# Patient Record
Sex: Female | Born: 1945
Health system: Southern US, Community
[De-identification: ages and names within clinical notes are randomized; demographics above are authoritative.]

## PROBLEM LIST (undated history)

## (undated) DIAGNOSIS — Z95810 Presence of automatic (implantable) cardiac defibrillator: Secondary | ICD-10-CM

## (undated) DIAGNOSIS — J9690 Respiratory failure, unspecified, unspecified whether with hypoxia or hypercapnia: Secondary | ICD-10-CM

## (undated) DIAGNOSIS — I480 Paroxysmal atrial fibrillation: Secondary | ICD-10-CM

## (undated) DIAGNOSIS — I5042 Chronic combined systolic (congestive) and diastolic (congestive) heart failure: Secondary | ICD-10-CM

## (undated) DIAGNOSIS — D649 Anemia, unspecified: Secondary | ICD-10-CM

## (undated) DIAGNOSIS — I2510 Atherosclerotic heart disease of native coronary artery without angina pectoris: Secondary | ICD-10-CM

## (undated) DIAGNOSIS — K279 Peptic ulcer, site unspecified, unspecified as acute or chronic, without hemorrhage or perforation: Secondary | ICD-10-CM

## (undated) DIAGNOSIS — K922 Gastrointestinal hemorrhage, unspecified: Secondary | ICD-10-CM

## (undated) DIAGNOSIS — J449 Chronic obstructive pulmonary disease, unspecified: Secondary | ICD-10-CM

## (undated) DIAGNOSIS — E119 Type 2 diabetes mellitus without complications: Secondary | ICD-10-CM

## (undated) DIAGNOSIS — I428 Other cardiomyopathies: Secondary | ICD-10-CM

## (undated) DIAGNOSIS — R053 Chronic cough: Secondary | ICD-10-CM

## (undated) DIAGNOSIS — F419 Anxiety disorder, unspecified: Secondary | ICD-10-CM

## (undated) DIAGNOSIS — E785 Hyperlipidemia, unspecified: Secondary | ICD-10-CM

## (undated) DIAGNOSIS — R0600 Dyspnea, unspecified: Secondary | ICD-10-CM

## (undated) DIAGNOSIS — I10 Essential (primary) hypertension: Secondary | ICD-10-CM

## (undated) DIAGNOSIS — I209 Angina pectoris, unspecified: Secondary | ICD-10-CM

## (undated) DIAGNOSIS — A0472 Enterocolitis due to Clostridium difficile, not specified as recurrent: Secondary | ICD-10-CM

## (undated) DIAGNOSIS — R05 Cough: Secondary | ICD-10-CM

## (undated) DIAGNOSIS — Z87442 Personal history of urinary calculi: Secondary | ICD-10-CM

## (undated) HISTORY — DX: Respiratory failure, unspecified, unspecified whether with hypoxia or hypercapnia: J96.90

## (undated) HISTORY — PX: ABDOMINAL HYSTERECTOMY: SHX81

## (undated) HISTORY — PX: CHOLECYSTECTOMY: SHX55

## (undated) HISTORY — PX: KIDNEY STONE SURGERY: SHX686

## (undated) HISTORY — DX: Anxiety disorder, unspecified: F41.9

## (undated) HISTORY — PX: COLONOSCOPY: SHX174

## (undated) HISTORY — PX: CORONARY ANGIOPLASTY WITH STENT PLACEMENT: SHX49

## (undated) HISTORY — PX: NASAL SEPTUM SURGERY: SHX37

## (undated) HISTORY — PX: APPENDECTOMY: SHX54

## (undated) HISTORY — PX: BOWEL RESECTION: SHX1257

## (undated) HISTORY — DX: Enterocolitis due to Clostridium difficile, not specified as recurrent: A04.72

## (undated) HISTORY — PX: CARPAL TUNNEL RELEASE: SHX101

---

## 2014-09-17 DIAGNOSIS — R079 Chest pain, unspecified: Secondary | ICD-10-CM | POA: Diagnosis not present

## 2014-09-17 DIAGNOSIS — R0989 Other specified symptoms and signs involving the circulatory and respiratory systems: Secondary | ICD-10-CM | POA: Diagnosis not present

## 2014-09-18 DIAGNOSIS — R1084 Generalized abdominal pain: Secondary | ICD-10-CM | POA: Diagnosis not present

## 2014-09-18 DIAGNOSIS — E119 Type 2 diabetes mellitus without complications: Secondary | ICD-10-CM | POA: Diagnosis not present

## 2014-09-18 DIAGNOSIS — G629 Polyneuropathy, unspecified: Secondary | ICD-10-CM | POA: Diagnosis not present

## 2014-10-03 DIAGNOSIS — Z760 Encounter for issue of repeat prescription: Secondary | ICD-10-CM | POA: Diagnosis not present

## 2014-10-03 DIAGNOSIS — K5900 Constipation, unspecified: Secondary | ICD-10-CM | POA: Diagnosis not present

## 2014-10-03 DIAGNOSIS — M792 Neuralgia and neuritis, unspecified: Secondary | ICD-10-CM | POA: Diagnosis not present

## 2014-11-07 DIAGNOSIS — M792 Neuralgia and neuritis, unspecified: Secondary | ICD-10-CM | POA: Diagnosis not present

## 2014-11-07 DIAGNOSIS — Z760 Encounter for issue of repeat prescription: Secondary | ICD-10-CM | POA: Diagnosis not present

## 2014-12-12 DIAGNOSIS — E119 Type 2 diabetes mellitus without complications: Secondary | ICD-10-CM | POA: Diagnosis not present

## 2014-12-16 DIAGNOSIS — E1142 Type 2 diabetes mellitus with diabetic polyneuropathy: Secondary | ICD-10-CM | POA: Diagnosis not present

## 2014-12-16 DIAGNOSIS — I10 Essential (primary) hypertension: Secondary | ICD-10-CM | POA: Diagnosis not present

## 2014-12-16 DIAGNOSIS — E1121 Type 2 diabetes mellitus with diabetic nephropathy: Secondary | ICD-10-CM | POA: Diagnosis not present

## 2014-12-16 DIAGNOSIS — J439 Emphysema, unspecified: Secondary | ICD-10-CM | POA: Diagnosis not present

## 2015-01-20 DIAGNOSIS — Z760 Encounter for issue of repeat prescription: Secondary | ICD-10-CM | POA: Diagnosis not present

## 2015-01-20 DIAGNOSIS — M792 Neuralgia and neuritis, unspecified: Secondary | ICD-10-CM | POA: Diagnosis not present

## 2015-01-28 DIAGNOSIS — I10 Essential (primary) hypertension: Secondary | ICD-10-CM | POA: Diagnosis not present

## 2015-01-28 DIAGNOSIS — E784 Other hyperlipidemia: Secondary | ICD-10-CM | POA: Diagnosis not present

## 2015-01-28 DIAGNOSIS — I2510 Atherosclerotic heart disease of native coronary artery without angina pectoris: Secondary | ICD-10-CM | POA: Diagnosis not present

## 2015-02-06 DIAGNOSIS — B070 Plantar wart: Secondary | ICD-10-CM | POA: Diagnosis not present

## 2015-02-06 DIAGNOSIS — M79671 Pain in right foot: Secondary | ICD-10-CM | POA: Diagnosis not present

## 2015-02-25 DIAGNOSIS — E119 Type 2 diabetes mellitus without complications: Secondary | ICD-10-CM | POA: Diagnosis not present

## 2015-02-25 DIAGNOSIS — R1110 Vomiting, unspecified: Secondary | ICD-10-CM | POA: Diagnosis not present

## 2015-02-25 DIAGNOSIS — R197 Diarrhea, unspecified: Secondary | ICD-10-CM | POA: Diagnosis not present

## 2015-02-26 DIAGNOSIS — R1110 Vomiting, unspecified: Secondary | ICD-10-CM | POA: Diagnosis not present

## 2015-02-26 DIAGNOSIS — R197 Diarrhea, unspecified: Secondary | ICD-10-CM | POA: Diagnosis not present

## 2015-02-26 DIAGNOSIS — E119 Type 2 diabetes mellitus without complications: Secondary | ICD-10-CM | POA: Diagnosis not present

## 2015-02-28 DIAGNOSIS — N179 Acute kidney failure, unspecified: Secondary | ICD-10-CM | POA: Diagnosis not present

## 2015-02-28 DIAGNOSIS — I2510 Atherosclerotic heart disease of native coronary artery without angina pectoris: Secondary | ICD-10-CM | POA: Diagnosis not present

## 2015-02-28 DIAGNOSIS — J9600 Acute respiratory failure, unspecified whether with hypoxia or hypercapnia: Secondary | ICD-10-CM | POA: Diagnosis not present

## 2015-02-28 DIAGNOSIS — R40243 Glasgow coma scale score 3-8: Secondary | ICD-10-CM | POA: Diagnosis not present

## 2015-02-28 DIAGNOSIS — R918 Other nonspecific abnormal finding of lung field: Secondary | ICD-10-CM | POA: Diagnosis not present

## 2015-02-28 DIAGNOSIS — J90 Pleural effusion, not elsewhere classified: Secondary | ICD-10-CM | POA: Diagnosis not present

## 2015-02-28 DIAGNOSIS — K529 Noninfective gastroenteritis and colitis, unspecified: Secondary | ICD-10-CM | POA: Diagnosis not present

## 2015-02-28 DIAGNOSIS — E119 Type 2 diabetes mellitus without complications: Secondary | ICD-10-CM | POA: Diagnosis present

## 2015-02-28 DIAGNOSIS — R0989 Other specified symptoms and signs involving the circulatory and respiratory systems: Secondary | ICD-10-CM | POA: Diagnosis not present

## 2015-02-28 DIAGNOSIS — C3490 Malignant neoplasm of unspecified part of unspecified bronchus or lung: Secondary | ICD-10-CM | POA: Diagnosis not present

## 2015-02-28 DIAGNOSIS — G8929 Other chronic pain: Secondary | ICD-10-CM | POA: Diagnosis present

## 2015-02-28 DIAGNOSIS — R197 Diarrhea, unspecified: Secondary | ICD-10-CM | POA: Diagnosis not present

## 2015-02-28 DIAGNOSIS — Z955 Presence of coronary angioplasty implant and graft: Secondary | ICD-10-CM | POA: Diagnosis not present

## 2015-02-28 DIAGNOSIS — E860 Dehydration: Secondary | ICD-10-CM | POA: Diagnosis present

## 2015-02-28 DIAGNOSIS — A411 Sepsis due to other specified staphylococcus: Secondary | ICD-10-CM | POA: Diagnosis not present

## 2015-02-28 DIAGNOSIS — A419 Sepsis, unspecified organism: Secondary | ICD-10-CM | POA: Diagnosis not present

## 2015-02-28 DIAGNOSIS — K6389 Other specified diseases of intestine: Secondary | ICD-10-CM | POA: Diagnosis not present

## 2015-02-28 DIAGNOSIS — M6281 Muscle weakness (generalized): Secondary | ICD-10-CM | POA: Diagnosis present

## 2015-02-28 DIAGNOSIS — R6520 Severe sepsis without septic shock: Secondary | ICD-10-CM | POA: Diagnosis not present

## 2015-02-28 DIAGNOSIS — I4891 Unspecified atrial fibrillation: Secondary | ICD-10-CM | POA: Diagnosis not present

## 2015-02-28 DIAGNOSIS — J9601 Acute respiratory failure with hypoxia: Secondary | ICD-10-CM | POA: Diagnosis not present

## 2015-02-28 DIAGNOSIS — Z7902 Long term (current) use of antithrombotics/antiplatelets: Secondary | ICD-10-CM | POA: Diagnosis not present

## 2015-02-28 DIAGNOSIS — Z7952 Long term (current) use of systemic steroids: Secondary | ICD-10-CM | POA: Diagnosis not present

## 2015-02-28 DIAGNOSIS — J189 Pneumonia, unspecified organism: Secondary | ICD-10-CM | POA: Diagnosis not present

## 2015-02-28 DIAGNOSIS — M79659 Pain in unspecified thigh: Secondary | ICD-10-CM | POA: Diagnosis not present

## 2015-02-28 DIAGNOSIS — R6521 Severe sepsis with septic shock: Secondary | ICD-10-CM | POA: Diagnosis not present

## 2015-02-28 DIAGNOSIS — J984 Other disorders of lung: Secondary | ICD-10-CM | POA: Diagnosis not present

## 2015-02-28 DIAGNOSIS — J181 Lobar pneumonia, unspecified organism: Secondary | ICD-10-CM | POA: Diagnosis not present

## 2015-02-28 DIAGNOSIS — Z66 Do not resuscitate: Secondary | ICD-10-CM | POA: Diagnosis present

## 2015-02-28 DIAGNOSIS — R262 Difficulty in walking, not elsewhere classified: Secondary | ICD-10-CM | POA: Diagnosis present

## 2015-02-28 DIAGNOSIS — J449 Chronic obstructive pulmonary disease, unspecified: Secondary | ICD-10-CM | POA: Diagnosis present

## 2015-02-28 DIAGNOSIS — J441 Chronic obstructive pulmonary disease with (acute) exacerbation: Secondary | ICD-10-CM | POA: Diagnosis not present

## 2015-02-28 DIAGNOSIS — F419 Anxiety disorder, unspecified: Secondary | ICD-10-CM | POA: Diagnosis present

## 2015-02-28 DIAGNOSIS — Z7982 Long term (current) use of aspirin: Secondary | ICD-10-CM | POA: Diagnosis not present

## 2015-02-28 DIAGNOSIS — Z452 Encounter for adjustment and management of vascular access device: Secondary | ICD-10-CM | POA: Diagnosis not present

## 2015-02-28 DIAGNOSIS — M353 Polymyalgia rheumatica: Secondary | ICD-10-CM | POA: Diagnosis present

## 2015-02-28 DIAGNOSIS — A047 Enterocolitis due to Clostridium difficile: Secondary | ICD-10-CM | POA: Diagnosis present

## 2015-02-28 DIAGNOSIS — A048 Other specified bacterial intestinal infections: Secondary | ICD-10-CM | POA: Diagnosis present

## 2015-02-28 DIAGNOSIS — T40601A Poisoning by unspecified narcotics, accidental (unintentional), initial encounter: Secondary | ICD-10-CM | POA: Diagnosis not present

## 2015-02-28 DIAGNOSIS — E118 Type 2 diabetes mellitus with unspecified complications: Secondary | ICD-10-CM | POA: Diagnosis present

## 2015-02-28 DIAGNOSIS — N19 Unspecified kidney failure: Secondary | ICD-10-CM | POA: Diagnosis not present

## 2015-02-28 DIAGNOSIS — R4182 Altered mental status, unspecified: Secondary | ICD-10-CM | POA: Diagnosis not present

## 2015-02-28 DIAGNOSIS — D649 Anemia, unspecified: Secondary | ICD-10-CM | POA: Diagnosis present

## 2015-02-28 DIAGNOSIS — J159 Unspecified bacterial pneumonia: Secondary | ICD-10-CM | POA: Diagnosis not present

## 2015-02-28 DIAGNOSIS — J440 Chronic obstructive pulmonary disease with acute lower respiratory infection: Secondary | ICD-10-CM | POA: Diagnosis not present

## 2015-02-28 DIAGNOSIS — E876 Hypokalemia: Secondary | ICD-10-CM | POA: Diagnosis not present

## 2015-02-28 DIAGNOSIS — I480 Paroxysmal atrial fibrillation: Secondary | ICD-10-CM | POA: Diagnosis not present

## 2015-02-28 DIAGNOSIS — F17210 Nicotine dependence, cigarettes, uncomplicated: Secondary | ICD-10-CM | POA: Diagnosis not present

## 2015-03-13 DIAGNOSIS — A419 Sepsis, unspecified organism: Secondary | ICD-10-CM | POA: Diagnosis not present

## 2015-03-13 DIAGNOSIS — R001 Bradycardia, unspecified: Secondary | ICD-10-CM | POA: Diagnosis not present

## 2015-03-13 DIAGNOSIS — D72829 Elevated white blood cell count, unspecified: Secondary | ICD-10-CM | POA: Diagnosis not present

## 2015-03-13 DIAGNOSIS — D500 Iron deficiency anemia secondary to blood loss (chronic): Secondary | ICD-10-CM | POA: Diagnosis not present

## 2015-03-13 DIAGNOSIS — N179 Acute kidney failure, unspecified: Secondary | ICD-10-CM | POA: Diagnosis not present

## 2015-03-13 DIAGNOSIS — J9601 Acute respiratory failure with hypoxia: Secondary | ICD-10-CM | POA: Diagnosis not present

## 2015-03-13 DIAGNOSIS — A047 Enterocolitis due to Clostridium difficile: Secondary | ICD-10-CM | POA: Diagnosis present

## 2015-03-13 DIAGNOSIS — R600 Localized edema: Secondary | ICD-10-CM | POA: Diagnosis not present

## 2015-03-13 DIAGNOSIS — E119 Type 2 diabetes mellitus without complications: Secondary | ICD-10-CM | POA: Diagnosis not present

## 2015-03-13 DIAGNOSIS — E876 Hypokalemia: Secondary | ICD-10-CM | POA: Diagnosis not present

## 2015-03-13 DIAGNOSIS — M6281 Muscle weakness (generalized): Secondary | ICD-10-CM | POA: Diagnosis present

## 2015-03-13 DIAGNOSIS — T40601A Poisoning by unspecified narcotics, accidental (unintentional), initial encounter: Secondary | ICD-10-CM | POA: Diagnosis not present

## 2015-03-13 DIAGNOSIS — J441 Chronic obstructive pulmonary disease with (acute) exacerbation: Secondary | ICD-10-CM | POA: Diagnosis not present

## 2015-03-13 DIAGNOSIS — J189 Pneumonia, unspecified organism: Secondary | ICD-10-CM | POA: Diagnosis not present

## 2015-03-13 DIAGNOSIS — R197 Diarrhea, unspecified: Secondary | ICD-10-CM | POA: Diagnosis not present

## 2015-03-13 DIAGNOSIS — R262 Difficulty in walking, not elsewhere classified: Secondary | ICD-10-CM | POA: Diagnosis present

## 2015-03-13 DIAGNOSIS — J449 Chronic obstructive pulmonary disease, unspecified: Secondary | ICD-10-CM | POA: Diagnosis present

## 2015-03-13 DIAGNOSIS — J202 Acute bronchitis due to streptococcus: Secondary | ICD-10-CM | POA: Diagnosis not present

## 2015-03-13 DIAGNOSIS — E118 Type 2 diabetes mellitus with unspecified complications: Secondary | ICD-10-CM | POA: Diagnosis present

## 2015-03-13 DIAGNOSIS — I480 Paroxysmal atrial fibrillation: Secondary | ICD-10-CM | POA: Diagnosis not present

## 2015-03-13 DIAGNOSIS — G8929 Other chronic pain: Secondary | ICD-10-CM | POA: Diagnosis present

## 2015-03-13 DIAGNOSIS — A048 Other specified bacterial intestinal infections: Secondary | ICD-10-CM | POA: Diagnosis present

## 2015-03-13 DIAGNOSIS — I4891 Unspecified atrial fibrillation: Secondary | ICD-10-CM | POA: Diagnosis not present

## 2015-03-18 DIAGNOSIS — D500 Iron deficiency anemia secondary to blood loss (chronic): Secondary | ICD-10-CM | POA: Diagnosis not present

## 2015-03-18 DIAGNOSIS — R600 Localized edema: Secondary | ICD-10-CM | POA: Diagnosis not present

## 2015-03-18 DIAGNOSIS — A047 Enterocolitis due to Clostridium difficile: Secondary | ICD-10-CM | POA: Diagnosis not present

## 2015-03-19 DIAGNOSIS — E876 Hypokalemia: Secondary | ICD-10-CM | POA: Diagnosis not present

## 2015-03-19 DIAGNOSIS — D72829 Elevated white blood cell count, unspecified: Secondary | ICD-10-CM | POA: Diagnosis not present

## 2015-03-19 DIAGNOSIS — R197 Diarrhea, unspecified: Secondary | ICD-10-CM | POA: Diagnosis not present

## 2015-03-24 DIAGNOSIS — D500 Iron deficiency anemia secondary to blood loss (chronic): Secondary | ICD-10-CM | POA: Diagnosis not present

## 2015-03-24 DIAGNOSIS — J202 Acute bronchitis due to streptococcus: Secondary | ICD-10-CM | POA: Diagnosis not present

## 2015-03-24 DIAGNOSIS — J441 Chronic obstructive pulmonary disease with (acute) exacerbation: Secondary | ICD-10-CM | POA: Diagnosis not present

## 2015-03-28 DIAGNOSIS — G629 Polyneuropathy, unspecified: Secondary | ICD-10-CM | POA: Diagnosis not present

## 2015-03-28 DIAGNOSIS — I10 Essential (primary) hypertension: Secondary | ICD-10-CM | POA: Diagnosis not present

## 2015-03-28 DIAGNOSIS — J439 Emphysema, unspecified: Secondary | ICD-10-CM | POA: Diagnosis not present

## 2015-04-01 DIAGNOSIS — L03115 Cellulitis of right lower limb: Secondary | ICD-10-CM | POA: Diagnosis not present

## 2015-04-03 DIAGNOSIS — M353 Polymyalgia rheumatica: Secondary | ICD-10-CM | POA: Diagnosis present

## 2015-04-03 DIAGNOSIS — N179 Acute kidney failure, unspecified: Secondary | ICD-10-CM | POA: Diagnosis not present

## 2015-04-03 DIAGNOSIS — Z955 Presence of coronary angioplasty implant and graft: Secondary | ICD-10-CM | POA: Diagnosis not present

## 2015-04-03 DIAGNOSIS — R197 Diarrhea, unspecified: Secondary | ICD-10-CM | POA: Diagnosis not present

## 2015-04-03 DIAGNOSIS — I2510 Atherosclerotic heart disease of native coronary artery without angina pectoris: Secondary | ICD-10-CM | POA: Diagnosis present

## 2015-04-03 DIAGNOSIS — R42 Dizziness and giddiness: Secondary | ICD-10-CM | POA: Diagnosis not present

## 2015-04-03 DIAGNOSIS — L03119 Cellulitis of unspecified part of limb: Secondary | ICD-10-CM | POA: Diagnosis not present

## 2015-04-03 DIAGNOSIS — F17210 Nicotine dependence, cigarettes, uncomplicated: Secondary | ICD-10-CM | POA: Diagnosis present

## 2015-04-03 DIAGNOSIS — E119 Type 2 diabetes mellitus without complications: Secondary | ICD-10-CM | POA: Diagnosis present

## 2015-04-03 DIAGNOSIS — E860 Dehydration: Secondary | ICD-10-CM | POA: Diagnosis not present

## 2015-04-03 DIAGNOSIS — J449 Chronic obstructive pulmonary disease, unspecified: Secondary | ICD-10-CM | POA: Diagnosis present

## 2015-04-03 DIAGNOSIS — L03115 Cellulitis of right lower limb: Secondary | ICD-10-CM | POA: Diagnosis not present

## 2015-04-03 DIAGNOSIS — R2241 Localized swelling, mass and lump, right lower limb: Secondary | ICD-10-CM | POA: Diagnosis not present

## 2015-04-11 DIAGNOSIS — L03115 Cellulitis of right lower limb: Secondary | ICD-10-CM | POA: Diagnosis not present

## 2015-04-15 DIAGNOSIS — I10 Essential (primary) hypertension: Secondary | ICD-10-CM | POA: Diagnosis not present

## 2015-04-15 DIAGNOSIS — L03115 Cellulitis of right lower limb: Secondary | ICD-10-CM | POA: Diagnosis not present

## 2015-04-22 DIAGNOSIS — R197 Diarrhea, unspecified: Secondary | ICD-10-CM | POA: Diagnosis not present

## 2015-04-22 DIAGNOSIS — J449 Chronic obstructive pulmonary disease, unspecified: Secondary | ICD-10-CM | POA: Diagnosis not present

## 2015-04-22 DIAGNOSIS — D72829 Elevated white blood cell count, unspecified: Secondary | ICD-10-CM | POA: Diagnosis not present

## 2015-04-22 DIAGNOSIS — Z9049 Acquired absence of other specified parts of digestive tract: Secondary | ICD-10-CM | POA: Diagnosis not present

## 2015-04-22 DIAGNOSIS — E876 Hypokalemia: Secondary | ICD-10-CM | POA: Diagnosis not present

## 2015-04-22 DIAGNOSIS — K529 Noninfective gastroenteritis and colitis, unspecified: Secondary | ICD-10-CM | POA: Diagnosis not present

## 2015-04-22 DIAGNOSIS — R109 Unspecified abdominal pain: Secondary | ICD-10-CM | POA: Diagnosis not present

## 2015-04-22 DIAGNOSIS — E119 Type 2 diabetes mellitus without complications: Secondary | ICD-10-CM | POA: Diagnosis not present

## 2015-04-22 DIAGNOSIS — K838 Other specified diseases of biliary tract: Secondary | ICD-10-CM | POA: Diagnosis not present

## 2015-04-22 DIAGNOSIS — A047 Enterocolitis due to Clostridium difficile: Secondary | ICD-10-CM | POA: Diagnosis not present

## 2015-04-22 DIAGNOSIS — K6389 Other specified diseases of intestine: Secondary | ICD-10-CM | POA: Diagnosis not present

## 2015-04-22 DIAGNOSIS — E8809 Other disorders of plasma-protein metabolism, not elsewhere classified: Secondary | ICD-10-CM | POA: Diagnosis not present

## 2015-04-23 DIAGNOSIS — D72829 Elevated white blood cell count, unspecified: Secondary | ICD-10-CM | POA: Diagnosis not present

## 2015-04-23 DIAGNOSIS — R109 Unspecified abdominal pain: Secondary | ICD-10-CM | POA: Diagnosis not present

## 2015-04-23 DIAGNOSIS — K529 Noninfective gastroenteritis and colitis, unspecified: Secondary | ICD-10-CM | POA: Diagnosis not present

## 2015-04-23 DIAGNOSIS — B967 Clostridium perfringens [C. perfringens] as the cause of diseases classified elsewhere: Secondary | ICD-10-CM | POA: Diagnosis not present

## 2015-04-23 DIAGNOSIS — R197 Diarrhea, unspecified: Secondary | ICD-10-CM | POA: Diagnosis not present

## 2015-04-23 DIAGNOSIS — A047 Enterocolitis due to Clostridium difficile: Secondary | ICD-10-CM | POA: Diagnosis not present

## 2015-04-23 DIAGNOSIS — E119 Type 2 diabetes mellitus without complications: Secondary | ICD-10-CM | POA: Diagnosis not present

## 2015-04-23 DIAGNOSIS — Z87891 Personal history of nicotine dependence: Secondary | ICD-10-CM | POA: Diagnosis not present

## 2015-04-23 DIAGNOSIS — I10 Essential (primary) hypertension: Secondary | ICD-10-CM | POA: Diagnosis not present

## 2015-04-23 DIAGNOSIS — R1031 Right lower quadrant pain: Secondary | ICD-10-CM | POA: Diagnosis not present

## 2015-04-24 DIAGNOSIS — Z7902 Long term (current) use of antithrombotics/antiplatelets: Secondary | ICD-10-CM | POA: Diagnosis not present

## 2015-04-24 DIAGNOSIS — Z87891 Personal history of nicotine dependence: Secondary | ICD-10-CM | POA: Diagnosis not present

## 2015-04-24 DIAGNOSIS — E876 Hypokalemia: Secondary | ICD-10-CM | POA: Diagnosis present

## 2015-04-24 DIAGNOSIS — A047 Enterocolitis due to Clostridium difficile: Secondary | ICD-10-CM | POA: Diagnosis not present

## 2015-04-24 DIAGNOSIS — R109 Unspecified abdominal pain: Secondary | ICD-10-CM | POA: Diagnosis not present

## 2015-04-24 DIAGNOSIS — I2510 Atherosclerotic heart disease of native coronary artery without angina pectoris: Secondary | ICD-10-CM | POA: Diagnosis present

## 2015-04-24 DIAGNOSIS — Z9049 Acquired absence of other specified parts of digestive tract: Secondary | ICD-10-CM | POA: Diagnosis present

## 2015-04-24 DIAGNOSIS — J449 Chronic obstructive pulmonary disease, unspecified: Secondary | ICD-10-CM | POA: Diagnosis present

## 2015-04-24 DIAGNOSIS — Z7982 Long term (current) use of aspirin: Secondary | ICD-10-CM | POA: Diagnosis not present

## 2015-04-24 DIAGNOSIS — K529 Noninfective gastroenteritis and colitis, unspecified: Secondary | ICD-10-CM | POA: Diagnosis not present

## 2015-04-24 DIAGNOSIS — R2241 Localized swelling, mass and lump, right lower limb: Secondary | ICD-10-CM | POA: Diagnosis not present

## 2015-04-24 DIAGNOSIS — D72829 Elevated white blood cell count, unspecified: Secondary | ICD-10-CM | POA: Diagnosis not present

## 2015-04-24 DIAGNOSIS — E119 Type 2 diabetes mellitus without complications: Secondary | ICD-10-CM | POA: Diagnosis present

## 2015-04-24 DIAGNOSIS — Z881 Allergy status to other antibiotic agents status: Secondary | ICD-10-CM | POA: Diagnosis not present

## 2015-04-24 DIAGNOSIS — E860 Dehydration: Secondary | ICD-10-CM | POA: Diagnosis present

## 2015-04-24 DIAGNOSIS — I10 Essential (primary) hypertension: Secondary | ICD-10-CM | POA: Diagnosis present

## 2015-04-24 DIAGNOSIS — E8809 Other disorders of plasma-protein metabolism, not elsewhere classified: Secondary | ICD-10-CM | POA: Diagnosis present

## 2015-04-24 DIAGNOSIS — R197 Diarrhea, unspecified: Secondary | ICD-10-CM | POA: Diagnosis not present

## 2015-04-24 DIAGNOSIS — G8929 Other chronic pain: Secondary | ICD-10-CM | POA: Diagnosis present

## 2015-04-28 DIAGNOSIS — A047 Enterocolitis due to Clostridium difficile: Secondary | ICD-10-CM | POA: Diagnosis not present

## 2015-05-03 DIAGNOSIS — J9811 Atelectasis: Secondary | ICD-10-CM | POA: Diagnosis not present

## 2015-05-03 DIAGNOSIS — E119 Type 2 diabetes mellitus without complications: Secondary | ICD-10-CM | POA: Diagnosis not present

## 2015-05-03 DIAGNOSIS — R0781 Pleurodynia: Secondary | ICD-10-CM | POA: Diagnosis not present

## 2015-05-03 DIAGNOSIS — I10 Essential (primary) hypertension: Secondary | ICD-10-CM | POA: Diagnosis not present

## 2015-05-03 DIAGNOSIS — R0602 Shortness of breath: Secondary | ICD-10-CM | POA: Diagnosis not present

## 2015-05-03 DIAGNOSIS — J441 Chronic obstructive pulmonary disease with (acute) exacerbation: Secondary | ICD-10-CM | POA: Diagnosis not present

## 2015-05-03 DIAGNOSIS — R9431 Abnormal electrocardiogram [ECG] [EKG]: Secondary | ICD-10-CM | POA: Diagnosis not present

## 2015-05-03 DIAGNOSIS — R072 Precordial pain: Secondary | ICD-10-CM | POA: Diagnosis not present

## 2015-05-03 DIAGNOSIS — R079 Chest pain, unspecified: Secondary | ICD-10-CM | POA: Diagnosis not present

## 2015-05-03 DIAGNOSIS — F17200 Nicotine dependence, unspecified, uncomplicated: Secondary | ICD-10-CM | POA: Diagnosis not present

## 2015-05-08 DIAGNOSIS — E559 Vitamin D deficiency, unspecified: Secondary | ICD-10-CM | POA: Diagnosis not present

## 2015-05-08 DIAGNOSIS — E1142 Type 2 diabetes mellitus with diabetic polyneuropathy: Secondary | ICD-10-CM | POA: Diagnosis not present

## 2015-05-12 DIAGNOSIS — N289 Disorder of kidney and ureter, unspecified: Secondary | ICD-10-CM | POA: Diagnosis not present

## 2015-05-12 DIAGNOSIS — I10 Essential (primary) hypertension: Secondary | ICD-10-CM | POA: Diagnosis not present

## 2015-05-12 DIAGNOSIS — K529 Noninfective gastroenteritis and colitis, unspecified: Secondary | ICD-10-CM | POA: Diagnosis not present

## 2015-05-12 DIAGNOSIS — I2510 Atherosclerotic heart disease of native coronary artery without angina pectoris: Secondary | ICD-10-CM | POA: Diagnosis not present

## 2015-05-12 DIAGNOSIS — E119 Type 2 diabetes mellitus without complications: Secondary | ICD-10-CM | POA: Diagnosis not present

## 2015-05-12 DIAGNOSIS — M353 Polymyalgia rheumatica: Secondary | ICD-10-CM | POA: Diagnosis present

## 2015-05-12 DIAGNOSIS — D72829 Elevated white blood cell count, unspecified: Secondary | ICD-10-CM | POA: Diagnosis not present

## 2015-05-12 DIAGNOSIS — Z7982 Long term (current) use of aspirin: Secondary | ICD-10-CM | POA: Diagnosis not present

## 2015-05-12 DIAGNOSIS — J449 Chronic obstructive pulmonary disease, unspecified: Secondary | ICD-10-CM | POA: Diagnosis not present

## 2015-05-12 DIAGNOSIS — G2581 Restless legs syndrome: Secondary | ICD-10-CM | POA: Diagnosis present

## 2015-05-12 DIAGNOSIS — A047 Enterocolitis due to Clostridium difficile: Secondary | ICD-10-CM | POA: Diagnosis not present

## 2015-05-12 DIAGNOSIS — D638 Anemia in other chronic diseases classified elsewhere: Secondary | ICD-10-CM | POA: Diagnosis not present

## 2015-05-12 DIAGNOSIS — E860 Dehydration: Secondary | ICD-10-CM | POA: Diagnosis not present

## 2015-05-12 DIAGNOSIS — F17210 Nicotine dependence, cigarettes, uncomplicated: Secondary | ICD-10-CM | POA: Diagnosis present

## 2015-05-12 DIAGNOSIS — R197 Diarrhea, unspecified: Secondary | ICD-10-CM | POA: Diagnosis not present

## 2015-05-12 DIAGNOSIS — Z23 Encounter for immunization: Secondary | ICD-10-CM | POA: Diagnosis not present

## 2015-05-12 DIAGNOSIS — D126 Benign neoplasm of colon, unspecified: Secondary | ICD-10-CM | POA: Diagnosis not present

## 2015-05-12 DIAGNOSIS — K6389 Other specified diseases of intestine: Secondary | ICD-10-CM | POA: Diagnosis not present

## 2015-05-25 ENCOUNTER — Encounter (HOSPITAL_COMMUNITY): Payer: Self-pay | Admitting: Emergency Medicine

## 2015-05-25 ENCOUNTER — Inpatient Hospital Stay (HOSPITAL_COMMUNITY)
Admission: EM | Admit: 2015-05-25 | Discharge: 2015-06-02 | DRG: 371 | Disposition: A | Discharge status: Home / Self Care | Payer: Medicare Other | Attending: Internal Medicine | Admitting: Internal Medicine

## 2015-05-25 DIAGNOSIS — Z885 Allergy status to narcotic agent status: Secondary | ICD-10-CM

## 2015-05-25 DIAGNOSIS — R609 Edema, unspecified: Secondary | ICD-10-CM | POA: Diagnosis not present

## 2015-05-25 DIAGNOSIS — D638 Anemia in other chronic diseases classified elsewhere: Secondary | ICD-10-CM | POA: Diagnosis present

## 2015-05-25 DIAGNOSIS — Z7952 Long term (current) use of systemic steroids: Secondary | ICD-10-CM

## 2015-05-25 DIAGNOSIS — Z720 Tobacco use: Secondary | ICD-10-CM | POA: Diagnosis present

## 2015-05-25 DIAGNOSIS — Z86010 Personal history of colonic polyps: Secondary | ICD-10-CM

## 2015-05-25 DIAGNOSIS — A047 Enterocolitis due to Clostridium difficile: Principal | ICD-10-CM | POA: Diagnosis present

## 2015-05-25 DIAGNOSIS — A0471 Enterocolitis due to Clostridium difficile, recurrent: Secondary | ICD-10-CM | POA: Diagnosis present

## 2015-05-25 DIAGNOSIS — I2510 Atherosclerotic heart disease of native coronary artery without angina pectoris: Secondary | ICD-10-CM | POA: Diagnosis present

## 2015-05-25 DIAGNOSIS — D473 Essential (hemorrhagic) thrombocythemia: Secondary | ICD-10-CM | POA: Diagnosis present

## 2015-05-25 DIAGNOSIS — K550 Acute vascular disorders of intestine: Secondary | ICD-10-CM | POA: Diagnosis not present

## 2015-05-25 DIAGNOSIS — K219 Gastro-esophageal reflux disease without esophagitis: Secondary | ICD-10-CM | POA: Diagnosis present

## 2015-05-25 DIAGNOSIS — Z955 Presence of coronary angioplasty implant and graft: Secondary | ICD-10-CM | POA: Diagnosis not present

## 2015-05-25 DIAGNOSIS — I774 Celiac artery compression syndrome: Secondary | ICD-10-CM | POA: Diagnosis not present

## 2015-05-25 DIAGNOSIS — E876 Hypokalemia: Secondary | ICD-10-CM | POA: Diagnosis present

## 2015-05-25 DIAGNOSIS — Z79891 Long term (current) use of opiate analgesic: Secondary | ICD-10-CM

## 2015-05-25 DIAGNOSIS — M353 Polymyalgia rheumatica: Secondary | ICD-10-CM | POA: Diagnosis present

## 2015-05-25 DIAGNOSIS — K5730 Diverticulosis of large intestine without perforation or abscess without bleeding: Secondary | ICD-10-CM | POA: Diagnosis not present

## 2015-05-25 DIAGNOSIS — D62 Acute posthemorrhagic anemia: Secondary | ICD-10-CM | POA: Diagnosis present

## 2015-05-25 DIAGNOSIS — E1129 Type 2 diabetes mellitus with other diabetic kidney complication: Secondary | ICD-10-CM | POA: Diagnosis not present

## 2015-05-25 DIAGNOSIS — R634 Abnormal weight loss: Secondary | ICD-10-CM | POA: Diagnosis not present

## 2015-05-25 DIAGNOSIS — K551 Chronic vascular disorders of intestine: Secondary | ICD-10-CM | POA: Diagnosis not present

## 2015-05-25 DIAGNOSIS — I10 Essential (primary) hypertension: Secondary | ICD-10-CM | POA: Diagnosis not present

## 2015-05-25 DIAGNOSIS — F17210 Nicotine dependence, cigarettes, uncomplicated: Secondary | ICD-10-CM | POA: Diagnosis present

## 2015-05-25 DIAGNOSIS — J449 Chronic obstructive pulmonary disease, unspecified: Secondary | ICD-10-CM | POA: Diagnosis present

## 2015-05-25 DIAGNOSIS — R194 Change in bowel habit: Secondary | ICD-10-CM | POA: Diagnosis not present

## 2015-05-25 DIAGNOSIS — R197 Diarrhea, unspecified: Secondary | ICD-10-CM | POA: Diagnosis not present

## 2015-05-25 DIAGNOSIS — K625 Hemorrhage of anus and rectum: Secondary | ICD-10-CM | POA: Diagnosis not present

## 2015-05-25 DIAGNOSIS — K559 Vascular disorder of intestine, unspecified: Secondary | ICD-10-CM

## 2015-05-25 DIAGNOSIS — K449 Diaphragmatic hernia without obstruction or gangrene: Secondary | ICD-10-CM | POA: Diagnosis present

## 2015-05-25 DIAGNOSIS — E860 Dehydration: Secondary | ICD-10-CM | POA: Diagnosis not present

## 2015-05-25 DIAGNOSIS — N179 Acute kidney failure, unspecified: Secondary | ICD-10-CM | POA: Diagnosis present

## 2015-05-25 DIAGNOSIS — Z886 Allergy status to analgesic agent status: Secondary | ICD-10-CM | POA: Diagnosis not present

## 2015-05-25 DIAGNOSIS — Z79899 Other long term (current) drug therapy: Secondary | ICD-10-CM | POA: Diagnosis not present

## 2015-05-25 DIAGNOSIS — I25119 Atherosclerotic heart disease of native coronary artery with unspecified angina pectoris: Secondary | ICD-10-CM | POA: Diagnosis present

## 2015-05-25 DIAGNOSIS — A0472 Enterocolitis due to Clostridium difficile, not specified as recurrent: Secondary | ICD-10-CM | POA: Diagnosis present

## 2015-05-25 DIAGNOSIS — E43 Unspecified severe protein-calorie malnutrition: Secondary | ICD-10-CM | POA: Diagnosis present

## 2015-05-25 DIAGNOSIS — Z8249 Family history of ischemic heart disease and other diseases of the circulatory system: Secondary | ICD-10-CM | POA: Diagnosis not present

## 2015-05-25 DIAGNOSIS — R109 Unspecified abdominal pain: Secondary | ICD-10-CM | POA: Diagnosis not present

## 2015-05-25 HISTORY — DX: Atherosclerotic heart disease of native coronary artery without angina pectoris: I25.10

## 2015-05-25 HISTORY — DX: Essential (primary) hypertension: I10

## 2015-05-25 LAB — URINE MICROSCOPIC-ADD ON

## 2015-05-25 LAB — COMPREHENSIVE METABOLIC PANEL
ALBUMIN: 3.3 g/dL — AB (ref 3.5–5.0)
ALT: 12 U/L — ABNORMAL LOW (ref 14–54)
ANION GAP: 13 (ref 5–15)
AST: 31 U/L (ref 15–41)
Alkaline Phosphatase: 120 U/L (ref 38–126)
BILIRUBIN TOTAL: 0.5 mg/dL (ref 0.3–1.2)
BUN: 16 mg/dL (ref 6–20)
CO2: 23 mmol/L (ref 22–32)
Calcium: 9.7 mg/dL (ref 8.9–10.3)
Chloride: 100 mmol/L — ABNORMAL LOW (ref 101–111)
Creatinine, Ser: 1.42 mg/dL — ABNORMAL HIGH (ref 0.44–1.00)
GFR, EST AFRICAN AMERICAN: 42 mL/min — AB (ref >60)
GFR, EST NON AFRICAN AMERICAN: 36 mL/min — AB (ref >60)
Glucose, Bld: 134 mg/dL — ABNORMAL HIGH (ref 65–99)
POTASSIUM: 4 mmol/L (ref 3.5–5.1)
Sodium: 136 mmol/L (ref 135–145)
TOTAL PROTEIN: 7.1 g/dL (ref 6.5–8.1)

## 2015-05-25 LAB — URINALYSIS, ROUTINE W REFLEX MICROSCOPIC
Glucose, UA: NEGATIVE mg/dL
KETONES UR: NEGATIVE mg/dL
NITRITE: NEGATIVE
Protein, ur: NEGATIVE mg/dL
Specific Gravity, Urine: 1.024 (ref 1.005–1.030)
UROBILINOGEN UA: 0.2 mg/dL (ref 0.0–1.0)
pH: 6 (ref 5.0–8.0)

## 2015-05-25 LAB — CBC
HEMATOCRIT: 29.9 % — AB (ref 36.0–46.0)
HEMOGLOBIN: 9.7 g/dL — AB (ref 12.0–15.0)
MCH: 28.1 pg (ref 26.0–34.0)
MCHC: 32.4 g/dL (ref 30.0–36.0)
MCV: 86.7 fL (ref 78.0–100.0)
Platelets: 542 10*3/uL — ABNORMAL HIGH (ref 150–400)
RBC: 3.45 MIL/uL — ABNORMAL LOW (ref 3.87–5.11)
RDW: 15.3 % (ref 11.5–15.5)
WBC: 8.9 10*3/uL (ref 4.0–10.5)

## 2015-05-25 LAB — CBC WITH DIFFERENTIAL/PLATELET
BASOS PCT: 0 %
Basophils Absolute: 0 10*3/uL (ref 0.0–0.1)
Eosinophils Absolute: 0.1 10*3/uL (ref 0.0–0.7)
Eosinophils Relative: 0 %
HEMATOCRIT: 35.6 % — AB (ref 36.0–46.0)
Hemoglobin: 11.7 g/dL — ABNORMAL LOW (ref 12.0–15.0)
LYMPHS ABS: 2.4 10*3/uL (ref 0.7–4.0)
Lymphocytes Relative: 15 %
MCH: 27.9 pg (ref 26.0–34.0)
MCHC: 32.9 g/dL (ref 30.0–36.0)
MCV: 84.8 fL (ref 78.0–100.0)
MONO ABS: 0.9 10*3/uL (ref 0.1–1.0)
MONOS PCT: 6 %
NEUTROS ABS: 12.3 10*3/uL — AB (ref 1.7–7.7)
Neutrophils Relative %: 79 %
Platelets: 673 10*3/uL — ABNORMAL HIGH (ref 150–400)
RBC: 4.2 MIL/uL (ref 3.87–5.11)
RDW: 15.1 % (ref 11.5–15.5)
WBC: 15.6 10*3/uL — ABNORMAL HIGH (ref 4.0–10.5)

## 2015-05-25 LAB — TYPE AND SCREEN
ABO/RH(D): A NEG
Antibody Screen: NEGATIVE

## 2015-05-25 LAB — GLUCOSE, CAPILLARY: Glucose-Capillary: 94 mg/dL (ref 65–99)

## 2015-05-25 LAB — LIPASE, BLOOD: LIPASE: 31 U/L (ref 22–51)

## 2015-05-25 LAB — ABO/RH: ABO/RH(D): A NEG

## 2015-05-25 LAB — I-STAT CG4 LACTIC ACID, ED: LACTIC ACID, VENOUS: 1.15 mmol/L (ref 0.5–2.0)

## 2015-05-25 MED ORDER — SODIUM CHLORIDE 0.9 % IV SOLN
INTRAVENOUS | Status: DC
  Administered 2015-05-25 – 2015-05-26 (×2): via INTRAVENOUS

## 2015-05-25 MED ORDER — ACETAMINOPHEN 650 MG RE SUPP: 650.0000 mg | Freq: Four times a day (QID) | RECTAL | Status: DC | PRN

## 2015-05-25 MED ORDER — PREDNISONE 5 MG PO TABS
5.0000 mg | ORAL_TABLET | Freq: Every day | ORAL | Status: DC
  Administered 2015-05-26: 5 mg via ORAL
  Filled 2015-05-25: qty 1

## 2015-05-25 MED ORDER — ONDANSETRON HCL 4 MG/2ML IJ SOLN
4.0000 mg | Freq: Four times a day (QID) | INTRAMUSCULAR | Status: DC | PRN
  Administered 2015-05-25 – 2015-06-01 (×15): 4 mg via INTRAVENOUS
  Filled 2015-05-25 (×16): qty 2

## 2015-05-25 MED ORDER — BOOST / RESOURCE BREEZE PO LIQD
1.0000 | Freq: Three times a day (TID) | ORAL | Status: DC
  Administered 2015-05-25: 1 via ORAL

## 2015-05-25 MED ORDER — SACCHAROMYCES BOULARDII 250 MG PO CAPS
250.0000 mg | ORAL_CAPSULE | Freq: Two times a day (BID) | ORAL | Status: DC
  Administered 2015-05-25 – 2015-06-02 (×15): 250 mg via ORAL
  Filled 2015-05-25 (×15): qty 1

## 2015-05-25 MED ORDER — VANCOMYCIN 50 MG/ML ORAL SOLUTION
250.0000 mg | Freq: Four times a day (QID) | ORAL | Status: DC
  Administered 2015-05-25 – 2015-05-30 (×17): 250 mg via ORAL
  Filled 2015-05-25 (×24): qty 5

## 2015-05-25 MED ORDER — SODIUM CHLORIDE 0.9 % IV SOLN: INTRAVENOUS | Status: DC

## 2015-05-25 MED ORDER — INSULIN ASPART 100 UNIT/ML ~~LOC~~ SOLN
0.0000 [IU] | Freq: Three times a day (TID) | SUBCUTANEOUS | Status: DC
  Administered 2015-05-28 – 2015-05-29 (×4): 1 [IU] via SUBCUTANEOUS
  Administered 2015-05-30: 2 [IU] via SUBCUTANEOUS
  Administered 2015-05-30: 1 [IU] via SUBCUTANEOUS
  Administered 2015-05-30 – 2015-06-02 (×4): 2 [IU] via SUBCUTANEOUS

## 2015-05-25 MED ORDER — SODIUM CHLORIDE 0.9 % IV BOLUS (SEPSIS)
1000.0000 mL | Freq: Once | INTRAVENOUS | Status: AC
Start: 2015-05-25 — End: 2015-05-25
  Administered 2015-05-25: 1000 mL via INTRAVENOUS

## 2015-05-25 MED ORDER — SODIUM CHLORIDE 0.9 % IV BOLUS (SEPSIS)
1000.0000 mL | Freq: Once | INTRAVENOUS | Status: AC
  Administered 2015-05-25: 1000 mL via INTRAVENOUS

## 2015-05-25 MED ORDER — ONDANSETRON HCL 4 MG/2ML IJ SOLN
4.0000 mg | Freq: Once | INTRAMUSCULAR | Status: AC
  Administered 2015-05-25: 4 mg via INTRAVENOUS
  Filled 2015-05-25: qty 2

## 2015-05-25 MED ORDER — DILTIAZEM HCL ER COATED BEADS 240 MG PO CP24
240.0000 mg | ORAL_CAPSULE | Freq: Every day | ORAL | Status: DC
  Administered 2015-05-26 – 2015-06-01 (×7): 240 mg via ORAL
  Filled 2015-05-25 (×2): qty 2
  Filled 2015-05-25: qty 1
  Filled 2015-05-25: qty 2
  Filled 2015-05-25 (×2): qty 1
  Filled 2015-05-25: qty 2

## 2015-05-25 MED ORDER — PENTOXIFYLLINE ER 400 MG PO TBCR
400.0000 mg | EXTENDED_RELEASE_TABLET | Freq: Every day | ORAL | Status: DC
  Administered 2015-05-25 – 2015-06-02 (×8): 400 mg via ORAL
  Filled 2015-05-25 (×10): qty 1

## 2015-05-25 MED ORDER — MAGNESIUM OXIDE 400 (241.3 MG) MG PO TABS
400.0000 mg | ORAL_TABLET | Freq: Two times a day (BID) | ORAL | Status: DC
  Administered 2015-05-25 – 2015-06-02 (×15): 400 mg via ORAL
  Filled 2015-05-25 (×15): qty 1

## 2015-05-25 MED ORDER — PREGABALIN 25 MG PO CAPS
50.0000 mg | ORAL_CAPSULE | Freq: Every day | ORAL | Status: DC
  Administered 2015-05-25 – 2015-06-02 (×9): 50 mg via ORAL
  Filled 2015-05-25: qty 2
  Filled 2015-05-25: qty 1
  Filled 2015-05-25: qty 2
  Filled 2015-05-25 (×2): qty 1
  Filled 2015-05-25 (×2): qty 2
  Filled 2015-05-25: qty 1

## 2015-05-25 MED ORDER — ALBUTEROL SULFATE (2.5 MG/3ML) 0.083% IN NEBU: 2.5000 mg | INHALATION_SOLUTION | RESPIRATORY_TRACT | Status: DC | PRN

## 2015-05-25 MED ORDER — HYDROMORPHONE HCL 1 MG/ML IJ SOLN
1.0000 mg | INTRAMUSCULAR | Status: DC | PRN
  Administered 2015-05-25 – 2015-06-02 (×40): 1 mg via INTRAVENOUS
  Filled 2015-05-25 (×41): qty 1

## 2015-05-25 MED ORDER — ISOSORBIDE MONONITRATE ER 30 MG PO TB24
15.0000 mg | ORAL_TABLET | Freq: Every day | ORAL | Status: DC
  Administered 2015-05-26 – 2015-06-02 (×8): 15 mg via ORAL
  Filled 2015-05-25 (×10): qty 1

## 2015-05-25 MED ORDER — HYDROMORPHONE HCL 1 MG/ML IJ SOLN
1.0000 mg | Freq: Once | INTRAMUSCULAR | Status: AC
  Administered 2015-05-25: 1 mg via INTRAVENOUS
  Filled 2015-05-25: qty 1

## 2015-05-25 MED ORDER — ONDANSETRON HCL 4 MG PO TABS
4.0000 mg | ORAL_TABLET | Freq: Four times a day (QID) | ORAL | Status: DC | PRN
  Administered 2015-05-29 – 2015-05-30 (×2): 4 mg via ORAL
  Filled 2015-05-25 (×2): qty 1

## 2015-05-25 MED ORDER — HYDROCODONE-ACETAMINOPHEN 7.5-325 MG PO TABS
0.5000 | ORAL_TABLET | Freq: Four times a day (QID) | ORAL | Status: DC | PRN
  Administered 2015-05-25 – 2015-05-26 (×2): 0.5 via ORAL
  Filled 2015-05-25 (×2): qty 1

## 2015-05-25 MED ORDER — ACETAMINOPHEN 325 MG PO TABS: 650.0000 mg | ORAL_TABLET | Freq: Four times a day (QID) | ORAL | Status: DC | PRN

## 2015-05-25 MED ORDER — VANCOMYCIN HCL 125 MG PO CAPS: 250.0000 mg | ORAL_CAPSULE | Freq: Four times a day (QID) | ORAL | Status: DC

## 2015-05-25 NOTE — Progress Notes (Signed)
Pt had CBC drawn at 2100, MD attached note to page on call provider if hemoglobin was <10. It was 9.7. Paged Rogue Bussing NP and he asked me to call him with result of next CBC at 0500. Hortencia Conradi RN

## 2015-05-25 NOTE — ED Provider Notes (Signed)
CSN: [id number]     Arrival date & time 05/25/15  1311 History   First MD Initiated Contact with Patient 05/25/15 1348     Chief Complaint  Patient presents with  . Abdominal Pain  . Diarrhea  . Emesis     (Consider location/radiation/quality/duration/timing/severity/associated sxs/prior Treatment) HPI Comments: Patient here complaining of profuse diarrhea with associated lower abdominal pain and nausea with nonbilious vomiting 2 days. Has a known diagnosis of C. difficile and is on vancomycin currently. Denies any fever or chills. Stools been watery without blood. Abdominal pain has been crampy. Denies any urinary symptoms. She is visiting from [place]. Symptoms persisted and he makes them better. No new treatments used prior to arrival  Patient is a 68 y.o. female presenting with abdominal pain, diarrhea, and vomiting. The history is provided by the patient and a relative.  Abdominal Pain Associated symptoms: diarrhea and vomiting   Diarrhea Associated symptoms: abdominal pain and vomiting   Emesis Associated symptoms: abdominal pain and diarrhea     Past Medical History  Diagnosis Date  . C. difficile diarrhea   . Hypertension   . Diabetes mellitus without complication    Past Surgical History  Procedure Laterality Date  . Abdominal hysterectomy    . Appendectomy    . Cholecystectomy    . Bowel resection    . Coronary angioplasty with stent placement     History reviewed. No pertinent family history. Social History  Substance Use Topics  . Smoking status: Current Every Day Smoker  . Smokeless tobacco: None  . Alcohol Use: No   OB History    No data available     Review of Systems  Gastrointestinal: Positive for vomiting, abdominal pain and diarrhea.  All other systems reviewed and are negative.     Allergies  Morphine and related and Nsaids  Home Medications   Prior to Admission medications   Not on File   BP 130/78 mmHg  Pulse 105  Temp(Src) 98.5  F (36.9 C) (Oral)  Resp 18  SpO2 100% Physical Exam  Constitutional: She is oriented to person, place, and time. She appears well-developed and well-nourished.  Non-toxic appearance. No distress.  HENT:  Head: Normocephalic and atraumatic.  Eyes: Conjunctivae, EOM and lids are normal. Pupils are equal, round, and reactive to light.  Neck: Normal range of motion. Neck supple. No tracheal deviation present. No thyroid mass present.  Cardiovascular: Regular rhythm and normal heart sounds.  Tachycardia present.  Exam reveals no gallop.   No murmur heard. Pulmonary/Chest: Effort normal and breath sounds normal. No stridor. No respiratory distress. She has no decreased breath sounds. She has no wheezes. She has no rhonchi. She has no rales.  Abdominal: Soft. Normal appearance and bowel sounds are normal. She exhibits no distension. There is generalized tenderness. There is no rigidity, no rebound, no guarding and no CVA tenderness.  Musculoskeletal: Normal range of motion. She exhibits no edema or tenderness.  Neurological: She is alert and oriented to person, place, and time. She has normal strength. No cranial nerve deficit or sensory deficit. GCS eye subscore is 4. GCS verbal subscore is 5. GCS motor subscore is 6.  Skin: Skin is warm and dry. No abrasion and no rash noted.  Psychiatric: She has a normal mood and affect. Her speech is normal and behavior is normal.  Nursing note and vitals reviewed.   ED Course  Procedures (including critical care time) Labs Review Labs Reviewed  URINE CULTURE  STOOL  CULTURE  CBC WITH DIFFERENTIAL/PLATELET  COMPREHENSIVE METABOLIC PANEL  LIPASE, BLOOD  URINALYSIS, ROUTINE W REFLEX MICROSCOPIC (NOT AT [facility])  GI PATHOGEN PANEL BY PCR, STOOL    Imaging Review No results found. I have personally reviewed and evaluated these images and lab results as part of my medical decision-making.   EKG Interpretation None      MDM   Final diagnoses:  None     Patient given IV fluids and pain meds here. Will be admitted for dehydration    Lacretia Leigh, MD 05/25/15 1528

## 2015-05-25 NOTE — H&P (Signed)
History and Physical  Kimberly Downs UDJ:[id number] DOB: 29-Oct-1945 DOA: 05/25/2015  Referring physician: Dr. Lacretia Leigh, EDP PCP: No primary care provider on file. Dr.[name] in [facility] Outpatient Specialists:  1. GI: Dr. Rica Mote  Chief Complaint: Profuse diarrhea, abdominal pain, nausea and vomiting.  HPI: Kimberly Downs is a 69 y.o. female , widowed, independent of activities at home and uses wheelchair walker when she steps out of her home, lives in [place], was visiting son in [place] for the last 4 days, PMH of recurrent severe C. difficile (3 since 02/28/2015), DM 2, HTN, CAD status post stent, ongoing tobacco abuse, presented to the [facility] ED on 05/25/15 with complaints of profuse diarrhea, abdominal pain, nausea and vomiting. Her last hospitalization in [place] was approximately 2 weeks ago when she underwent colonoscopy and was told by her gastroenterologist that she has C. difficile colitis. She was also informed that if current medical management fails then they may have to consider fecal transplantation. With medical treatment, her symptoms improved with decrease in diarrhea, abdominal pain and vomiting. She has lost approximately 30 pounds since July. She was in her usual state of health until 2 nights ago when after eating some rice from a [place] food place, started having worsening diarrhea-up to every half hour, watery, yellow in color with mucus, last 2 episodes noted some bright red blood admixed with stools, intermittent severe lower abdominal pain, couple of episodes of nonbloody emesis and followed by dry heaving, decreased appetite, chills but no fevers. Feels generally weak and worn out. In the ED, hemodynamically stable, lab work significant for creatinine 1.4 to WBC 15.6 hemoglobin 11.7, platelets 673. No baseline labs to compare. Hospitalist admission was requested for recurrent C. difficile colitis, dehydration and acute kidney  injury.   Review of Systems: All systems reviewed and apart from history of presenting illness, are negative.  Past Medical History  Diagnosis Date  . C. difficile diarrhea   . Hypertension   . Diabetes mellitus without complication   . Coronary artery disease    Past Surgical History  Procedure Laterality Date  . Abdominal hysterectomy    . Appendectomy    . Cholecystectomy    . Bowel resection    . Coronary angioplasty with stent placement    . Colonoscopy  Early September 2016   Social History:  reports that she has been smoking.  She does not have any smokeless tobacco history on file. She reports that she does not drink alcohol or use illicit drugs. Widowed. Rest as per history of presenting illness.  Allergies  Allergen Reactions  . Morphine And Related Nausea And Vomiting    emesis  . Nsaids Nausea And Vomiting    emesis  . Toradol [Ketorolac Tromethamine] Nausea And Vomiting    History reviewed. No pertinent family history. patient denies any significant family history.  Prior to Admission medications   Medication Sig Start Date End Date Taking? Authorizing Provider  COMBIVENT RESPIMAT 20-100 MCG/ACT AERS respimat Inhale 1 puff into the lungs daily as needed. 03/25/15  Yes Historical Provider, MD  diltiazem (CARDIZEM CD) 240 MG 24 hr capsule Take 240 mg by mouth daily. 05/01/15  Yes Historical Provider, MD  FLORASTOR 250 MG capsule Take 250 mg by mouth daily. 05/17/15  Yes Historical Provider, MD  HYDROcodone-acetaminophen (NORCO) 7.5-325 MG per tablet Take 0.5 tablets by mouth 2 (two) times daily. 04/28/15  Yes Historical Provider, MD  isosorbide mononitrate (IMDUR) 30 MG 24  hr tablet Take 15 mg by mouth daily. 05/03/15  Yes Historical Provider, MD  lisinopril (PRINIVIL,ZESTRIL) 40 MG tablet Take 40 mg by mouth daily. 04/28/15  Yes Historical Provider, MD  LYRICA 50 MG capsule Take 1 capsule by mouth daily. 04/01/15  Yes Historical Provider, MD  magnesium oxide (MAG-OX) 400  (241.3 MG) MG tablet Take 1 tablet by mouth 2 (two) times daily. 05/17/15  Yes Historical Provider, MD  ondansetron (ZOFRAN) 4 MG tablet Take 1 tablet by mouth every 6 (six) hours as needed for nausea or vomiting.  04/28/15  Yes Historical Provider, MD  pentoxifylline (TRENTAL) 400 MG CR tablet Take 400 mg by mouth daily.   Yes Historical Provider, MD  potassium chloride (K-DUR) 10 MEQ tablet Take 10 mEq by mouth daily. 03/24/15  Yes Historical Provider, MD  predniSONE (DELTASONE) 5 MG tablet Take 5 mg by mouth daily with breakfast.   Yes Historical Provider, MD  vancomycin (VANCOCIN) 250 MG capsule Take 250 mg by mouth 4 (four) times daily. 05/17/15  Yes Historical Provider, MD  VENTOLIN HFA 108 (90 BASE) MCG/ACT inhaler Inhale 2 puffs into the lungs every 6 (six) hours as needed. 05/03/15  Yes Historical Provider, MD  furosemide (LASIX) 20 MG tablet Take 20 mg by mouth daily. 05/01/15   Historical Provider, MD   Physical Exam: Filed Vitals:   05/25/15 1321 05/25/15 1554 05/25/15 1640  BP: 130/78 131/55 132/93  Pulse: 105 94 85  Temp: 98.5 F (36.9 C)  99.3 F (37.4 C)  TempSrc: Oral  Oral  Resp: '18 18 18  '$ Height:   5' 0.5" (1.537 m)  Weight:   59.194 kg (130 lb 8 oz)  SpO2: 100% 100% 100%     General exam: Moderately built and nourished pleasant elderly female patient, lying comfortably supine on the gurney in no obvious distress. Does not look septic or toxic.  Head, eyes and ENT: Nontraumatic and normocephalic. Pupils equally reacting to light and accommodation. Oral mucosa dry.  Neck: Supple. No JVD, carotid bruit or thyromegaly.  Lymphatics: No lymphadenopathy.  Respiratory system: Clear to auscultation. No increased work of breathing.  Cardiovascular system: S1 and S2 heard, RRR. No JVD, murmurs, gallops, clicks or pedal edema.  Gastrointestinal system: Abdomen is nondistended, soft. Diffuse mild tenderness without rigidity, guarding or rebound. Normal bowel sounds heard. No  organomegaly or masses appreciated.  Central nervous system: Alert and oriented. No focal neurological deficits.  Extremities: Symmetric 5 x 5 power. Peripheral pulses symmetrically felt.   Skin: No rashes or acute findings.  Musculoskeletal system: Negative exam.  Psychiatry: Pleasant and cooperative.   Labs on Admission:  Basic Metabolic Panel:  Recent Labs Lab 05/25/15 1406  NA 136  K 4.0  CL 100*  CO2 23  GLUCOSE 134*  BUN 16  CREATININE 1.42*  CALCIUM 9.7   Liver Function Tests:  Recent Labs Lab 05/25/15 1406  AST 31  ALT 12*  ALKPHOS 120  BILITOT 0.5  PROT 7.1  ALBUMIN 3.3*    Recent Labs Lab 05/25/15 1406  LIPASE 31   No results for input(s): AMMONIA in the last 168 hours. CBC:  Recent Labs Lab 05/25/15 1406  WBC 15.6*  NEUTROABS 12.3*  HGB 11.7*  HCT 35.6*  MCV 84.8  PLT 673*   Cardiac Enzymes: No results for input(s): CKTOTAL, CKMB, CKMBINDEX, TROPONINI in the last 168 hours.  BNP (last 3 results) No results for input(s): PROBNP in the last 8760 hours. CBG: No results for input(s): GLUCAP  in the last 168 hours.  Radiological Exams on Admission: No results found.  EKG: None seen in EPic. Will request for AM.  Assessment/Plan Principal Problem:   Clostridium difficile diarrhea Active Problems:   Dehydration   Acute kidney injury   Anemia   DM (diabetes mellitus), type 2 with renal complications   Essential hypertension   CAD (coronary artery disease)   Tobacco abuse   Recurrent colitis due to Clostridium difficile   Recurrent C. difficile colitis - As per patient, she has had 3 prior hospitalizations since 02/28/2015 for severe C. difficile colitis. Underwent colonoscopy approximately 2 weeks ago confirming the diagnosis. Claims compliance with her oral vancomycin and probiotics - Follow stool culture and GI pathogen panel PCR. Do not see a point in repeating C. difficile PCR. - Request medical records from her PCP, GI  and hospitalization from [place] - Discussed with infectious disease M.D. on call: Recommended continuing current dose of oral vancomycin, increase Florastor 250 MG twice a day and monitor closely. Do not see a role to add Xifaxan mean or increasing the dose of oral vancomycin - Mild bright red blood in stools most likely related to colitis. Monitor clinically and with hemoglobin in a.m.  Dehydration - Due to GI losses and poor oral intake. IV fluids  Acute kidney injury - Due to dehydration and ACEI. Hold ACEI and Lasix. IV fluids and follow BMP in a.m.  Essential hypertension - Hold ACEI. Continue Cardizem.  Anemia - Baseline hemoglobin not known. Follow CBC in a.m.  Type II DM with renal complications - SSI. Not on medications at home  CAD status post PCI - Asymptomatic of chest pain  Tobacco abuse - Cessation counseled.   DVT prophylaxis: SCDs Code Status:  Full  Family Communication:  Discussed with son at bedside  Disposition Plan:  DC home when medically stable, possibly in 3-4 days.   Time spent:  24 minutes  HONGALGI,ANAND, MD, FACP, FHM. [facility] Pager [phone]  If 7PM-7AM, please contact night-coverage [email or web address] Password [facility] 05/25/2015, 6:06 PM

## 2015-05-25 NOTE — ED Notes (Signed)
Pt reports recurrent C diff for the past several months. Last lab test negative, but doctor at her colonoscopy said it was positive. Just started on new abx for c diff. Pt began to have emesis and abd pain on Friday. Has over 10 episodes of emesis a day. Has been able to keep down some fluids today.

## 2015-05-25 NOTE — ED Notes (Signed)
Hospitalist at the bedside 

## 2015-05-26 LAB — CBC
HCT: 29.5 % — ABNORMAL LOW (ref 36.0–46.0)
HEMATOCRIT: 29.4 % — AB (ref 36.0–46.0)
HEMOGLOBIN: 9.2 g/dL — AB (ref 12.0–15.0)
HEMOGLOBIN: 9.4 g/dL — AB (ref 12.0–15.0)
MCH: 27.5 pg (ref 26.0–34.0)
MCH: 27.9 pg (ref 26.0–34.0)
MCHC: 31.2 g/dL (ref 30.0–36.0)
MCHC: 32 g/dL (ref 30.0–36.0)
MCV: 88.3 fL (ref 78.0–100.0)
MCV: 87.2 fL (ref 78.0–100.0)
Platelets: 550 10*3/uL — ABNORMAL HIGH (ref 150–400)
Platelets: 540 10*3/uL — ABNORMAL HIGH (ref 150–400)
RBC: 3.34 MIL/uL — ABNORMAL LOW (ref 3.87–5.11)
RBC: 3.37 MIL/uL — AB (ref 3.87–5.11)
RDW: 15.3 % (ref 11.5–15.5)
RDW: 15.1 % (ref 11.5–15.5)
WBC: 7.2 10*3/uL (ref 4.0–10.5)
WBC: 7.1 10*3/uL (ref 4.0–10.5)

## 2015-05-26 LAB — GLUCOSE, CAPILLARY
GLUCOSE-CAPILLARY: 92 mg/dL (ref 65–99)
GLUCOSE-CAPILLARY: 107 mg/dL — AB (ref 65–99)
GLUCOSE-CAPILLARY: 117 mg/dL — AB (ref 65–99)
Glucose-Capillary: 123 mg/dL — ABNORMAL HIGH (ref 65–99)

## 2015-05-26 LAB — BASIC METABOLIC PANEL
ANION GAP: 4 — AB (ref 5–15)
BUN: 10 mg/dL (ref 6–20)
CALCIUM: 8.3 mg/dL — AB (ref 8.9–10.3)
CO2: 24 mmol/L (ref 22–32)
Chloride: 111 mmol/L (ref 101–111)
Creatinine, Ser: 1.05 mg/dL — ABNORMAL HIGH (ref 0.44–1.00)
GFR calc Af Amer: >60 mL/min (ref >60)
GFR calc non Af Amer: 53 mL/min — ABNORMAL LOW (ref >60)
GLUCOSE: 97 mg/dL (ref 65–99)
Potassium: 4.6 mmol/L (ref 3.5–5.1)
Sodium: 139 mmol/L (ref 135–145)

## 2015-05-26 LAB — URINE CULTURE

## 2015-05-26 MED ORDER — PREDNISONE 5 MG PO TABS
5.0000 mg | ORAL_TABLET | Freq: Once | ORAL | Status: AC
  Administered 2015-05-26: 5 mg via ORAL
  Filled 2015-05-26: qty 1

## 2015-05-26 MED ORDER — POTASSIUM CHLORIDE IN NACL 20-0.9 MEQ/L-% IV SOLN
INTRAVENOUS | Status: DC
  Administered 2015-05-26 – 2015-05-27 (×2): via INTRAVENOUS
  Filled 2015-05-26 (×4): qty 1000

## 2015-05-26 MED ORDER — HYDROCODONE-ACETAMINOPHEN 7.5-325 MG PO TABS
1.0000 | ORAL_TABLET | Freq: Four times a day (QID) | ORAL | Status: DC | PRN
  Administered 2015-05-26 (×2): 1 via ORAL
  Filled 2015-05-26 (×2): qty 1

## 2015-05-26 MED ORDER — HYDROCODONE-ACETAMINOPHEN 7.5-325 MG PO TABS
1.0000 | ORAL_TABLET | ORAL | Status: AC | PRN
  Administered 2015-05-27 (×2): 1 via ORAL
  Filled 2015-05-26 (×2): qty 1

## 2015-05-26 MED ORDER — PREDNISONE 5 MG PO TABS
10.0000 mg | ORAL_TABLET | Freq: Every day | ORAL | Status: DC
  Administered 2015-05-28 – 2015-06-02 (×6): 10 mg via ORAL
  Filled 2015-05-26 (×3): qty 1
  Filled 2015-05-26: qty 2
  Filled 2015-05-26: qty 1
  Filled 2015-05-26 (×2): qty 2

## 2015-05-26 MED ORDER — HYOSCYAMINE SULFATE 0.125 MG SL SUBL
0.2500 mg | SUBLINGUAL_TABLET | Freq: Once | SUBLINGUAL | Status: AC
  Administered 2015-05-27: 0.25 mg via SUBLINGUAL
  Filled 2015-05-26: qty 2

## 2015-05-26 MED ORDER — PEG 3350-KCL-NA BICARB-NACL 420 G PO SOLR
4000.0000 mL | Freq: Once | ORAL | Status: AC
  Administered 2015-05-26: 4000 mL via ORAL
  Filled 2015-05-26: qty 4000

## 2015-05-26 NOTE — Progress Notes (Addendum)
PROGRESS NOTE    Kimberly Downs JJK:[id number] DOB: [birth year] DOA: 05/25/2015 PCP: No primary care provider on file. Dr.[name] in [facility] Outpatient Specialists:  1. GI: Dr. Rica Mote  HPI/Brief narrative 69 y.o. female , widowed, independent of activities at home and uses wheelchair walker when she steps out of her home, lives in [place], was visiting son in [place] for the last 4 days, PMH of recurrent severe C. difficile (3 since 02/28/2015), DM 2, HTN, CAD status post stent, ongoing tobacco abuse, presented to the [facility] ED on 05/25/15 with complaints of profuse diarrhea with some blood, abdominal pain, nausea and vomiting. In the ED, hemodynamically stable, lab work significant for creatinine 1.4 to WBC 15.6 hemoglobin 11.7, platelets 673. No baseline labs to compare. Hospitalist admission was requested for recurrent C. difficile colitis, dehydration and acute kidney injury.   Assessment/Plan:  Recurrent C. difficile colitis - As per patient, she has had 3 prior hospitalizations since 02/28/2015 for severe C. difficile colitis. Underwent colonoscopy approximately 2 weeks ago confirming the diagnosis. Claims compliance with her oral vancomycin and probiotics - Follow stool culture and GI pathogen panel PCR. Do not see a point in repeating C. difficile PCR. - Requested medical records from her PCP, GI and hospitalization from [place] - Discussed with infectious disease M.D. on call 9/25: Recommended continuing current dose of oral vancomycin, increase Florastor 250 MG twice a day and monitor closely. Do not see a role to add Xifaxan mean or increasing the dose of oral vancomycin - Continues to have frequent diarrhea & intermittently bloody, abdominal pain. - GI/Dr. Collene Mares consulted for assistance.? Fecal transplantation  Dehydration - Due to GI losses and poor oral intake. Improving. Continue IV fluids.  Acute kidney injury - Due to dehydration and ACEI. Hold  ACEI and Lasix. Resolved after IV fluids. Continue IV fluids.  Essential hypertension - Hold ACEI. Continue Cardizem. Controlled.  Anemia - Baseline hemoglobin not known. Hemoglobin has gradually dropped to 11.7 > 9.7 > 9.2. Follow CBC closely and transfuse if hemoglobin <8 g per DL.  Type II DM with renal complications - SSI. Not on medications at home. Controlled  CAD status post PCI - Asymptomatic of chest pain  Tobacco abuse - Cessation counseled.  Severe malnutrition in context of acute illness/injury - Management per dietitian.  PMR on chronic prednisone - We'll double the dose of prednisone for stressful situation.   DVT prophylaxis: SCDs Code Status: Full  Family Communication: Discussed with son at bedside on 9/25. None at bedside today. Disposition Plan: DC home when medically stable, possibly in 3-4 days.    Consultants:  GI/Dr. Collene Mares   Procedures:  None  Antibiotics:  Oral vancomycin 9/25 (had been on this prior to admission) >  Subjective: Abdominal pain controlled on pain medications. Nausea but no vomiting reported this morning. Still having frequent diarrhea- intermittent bright red blood in stools.  Objective: Filed Vitals:   05/25/15 1554 05/25/15 1640 05/25/15 2135 05/26/15 0702  BP: 131/55 132/93 115/44 101/54  Pulse: 94 85 73 70  Temp:  99.3 F (37.4 C) 98.5 F (36.9 C) 98.2 F (36.8 C)  TempSrc:  Oral Oral Oral  Resp: '18 18 20 20  '$ Height:  5' 0.5" (1.537 m)    Weight:  59.194 kg (130 lb 8 oz)    SpO2: 100% 100% 96% 99%   No intake or output data in the 24 hours ending 05/26/15 1540 Filed Weights   05/25/15 1640  Weight:  59.194 kg (130 lb 8 oz)     Exam:  General exam: Pleasant elderly female sitting up comfortably in bed this morning. Respiratory system: Clear. No increased work of breathing. Cardiovascular system: S1 & S2 heard, RRR. No JVD, murmurs, gallops, clicks or pedal edema. Gastrointestinal system: Abdomen is  nondistended, soft, diffuse mild tenderness without peritoneal signs. Normal bowel sounds heard. Central nervous system: Alert and oriented. No focal neurological deficits. Extremities: Symmetric 5 x 5 power.   Data Reviewed: Basic Metabolic Panel:  Recent Labs Lab 05/25/15 1406 05/26/15 0345  NA 136 139  K 4.0 4.6  CL 100* 111  CO2 23 24  GLUCOSE 134* 97  BUN 16 10  CREATININE 1.42* 1.05*  CALCIUM 9.7 8.3*   Liver Function Tests:  Recent Labs Lab 05/25/15 1406  AST 31  ALT 12*  ALKPHOS 120  BILITOT 0.5  PROT 7.1  ALBUMIN 3.3*    Recent Labs Lab 05/25/15 1406  LIPASE 31   No results for input(s): AMMONIA in the last 168 hours. CBC:  Recent Labs Lab 05/25/15 1406 05/25/15 2111 05/26/15 0345  WBC 15.6* 8.9 7.2  NEUTROABS 12.3*  --   --   HGB 11.7* 9.7* 9.2*  HCT 35.6* 29.9* 29.5*  MCV 84.8 86.7 88.3  PLT 673* 542* 550*   Cardiac Enzymes: No results for input(s): CKTOTAL, CKMB, CKMBINDEX, TROPONINI in the last 168 hours. BNP (last 3 results) No results for input(s): PROBNP in the last 8760 hours. CBG:  Recent Labs Lab 05/25/15 2129 05/26/15 0730  GLUCAP 94 92    Recent Results (from the past 240 hour(s))  Urine culture     Status: None (Preliminary result)   Collection Time: 05/25/15  2:00 PM  Result Value Ref Range Status   Specimen Description URINE, CLEAN CATCH  Final   Special Requests NONE  Final   Culture   Final    CULTURE REINCUBATED FOR BETTER GROWTH Performed at [facility]    Report Status PENDING  Incomplete        Studies: No results found.      Scheduled Meds: . diltiazem  240 mg Oral Daily  . insulin aspart  0-9 Units Subcutaneous TID WC  . isosorbide mononitrate  15 mg Oral Daily  . magnesium oxide  400 mg Oral BID  . pentoxifylline  400 mg Oral Daily  . predniSONE  5 mg Oral Q breakfast  . pregabalin  50 mg Oral Daily  . saccharomyces boulardii  250 mg Oral BID  . vancomycin  250 mg Oral Q6H    Continuous Infusions: . 0.9 % NaCl with KCl 20 mEq / L 100 mL/hr at 05/26/15 1234    Principal Problem:   Clostridium difficile diarrhea Active Problems:   Dehydration   Acute kidney injury   Anemia   DM (diabetes mellitus), type 2 with renal complications   Essential hypertension   CAD (coronary artery disease)   Tobacco abuse   Recurrent colitis due to Clostridium difficile    Time spent: 35 minutes.    Vernell Leep, MD, FACP, FHM. [facility] Pager [phone]  If 7PM-7AM, please contact night-coverage [email or web address] Password [email or web address] 05/26/2015, 3:40 PM    LOS: 1 day

## 2015-05-26 NOTE — Progress Notes (Signed)
Initial Nutrition Assessment  DOCUMENTATION CODES:   Severe malnutrition in context of acute illness/injury  INTERVENTION:   -D/C Boost Breeze -When diet is advanced past clears, provide Ensure Enlive po BID, each supplement provides 350 kcal and 20 grams of protein -Encourage PO intake -RD to continue to monitor  NUTRITION DIAGNOSIS:   Malnutrition related to acute illness as evidenced by energy intake < or equal to 50% for > or equal to 5 days, percent weight loss, mild depletion of body fat, mild depletion of muscle mass.  GOAL:   Patient will meet greater than or equal to 90% of their needs  MONITOR:   PO intake, Supplement acceptance, Diet advancement, Labs, Weight trends, Skin, I & O's  REASON FOR ASSESSMENT:   Malnutrition Screening Tool    ASSESSMENT:   69 y.o. female , widowed, independent of activities at home and uses wheelchair walker when she steps out of her home, lives in [place], was visiting son in [place] for the last 4 days, PMH of recurrent severe C. difficile (3 since 02/28/2015), DM 2, HTN, CAD status post stent, ongoing tobacco abuse, presented to the [facility] ED on 05/25/15 with complaints of profuse diarrhea, abdominal pain, nausea and vomiting. Her last hospitalization in [place] was approximately 2 weeks ago when she underwent colonoscopy and was told by her gastroenterologist that she has C. difficile colitis.  Pt reports blood in her stool today.   Pt reports 30 lb weight loss since July 1, UBW is 159 lb (18% weight loss x  3 months, significant for time frame). Pt has not been able to tolerate food during this period of time. Pt did not eat much of clear liquid tray today, mostly juice. Pt does not like the Boost Breeze supplements, RD to d/c. Pt would like chocolate Ensure when she her diet is advanced.   Nutrition-Focused physical exam completed. Findings are mild fat depletion, mild muscle depletion, and no edema.    Labs reviewed: Elevated Creatinine  Diet Order:  Diet clear liquid Room service appropriate?: Yes; Fluid consistency:: Thin  Skin:  Reviewed, no issues  Last BM:  9/26  Height:   Ht Readings from Last 1 Encounters:  05/25/15 5' 0.5" (1.537 m)    Weight:   Wt Readings from Last 1 Encounters:  05/25/15 130 lb 8 oz (59.194 kg)    Ideal Body Weight:  47.7 kg  BMI:  Body mass index is 25.06 kg/(m^2).  Estimated Nutritional Needs:   Kcal:  4142-3953  Protein:  75-85g  Fluid:  1.8L/day  EDUCATION NEEDS:   No education needs identified at this time  Clayton Bibles, MS, RD, LDN Pager: [phone] After Hours Pager: [phone]

## 2015-05-26 NOTE — Consult Note (Addendum)
UNASSIGNED PATIENT Reason for Consult: Rectal bleeding and diarrhea ?recurrent C. Difficile colitis. Referring Physician: [facility]-Dr. Verner Mould.  Kimberly Downs is an 69 y.o. female.  HPI: 69 year old white female, who was visiting her son here in [place], originally a resident of [place], developed C. Difficile colitis in early July and claims this is her 5th hospitalization for it. She claims for the last 3 months she has been treated with several courses ov Vancomycin with probiotics but her symptoms keep recurring. Since yesterday, she has had a lot of rectal bleeding, which she claims has never happened before. To the best of my ability, her records reveal normal colonic mucosa on biopsies from a colonoscopy done in January this year. She claims she had another colonoscopy 2 weeks ago but we do not have that operative report. She has severe post-prandial abdominal pain with a 30 lb weight loss in 3 months and sitophobia. In fact before she had the C. Difficile colitis, she was being worked up for abdominal pain, worse after meals. She has also had colonic polyps removed in the past but she denies a family history of colon cancer. She has had reflux for a long time and has a large hiatal hernia. There is no history of dysphagia or odynophagia. She denies having any melenic stools. She claims she has ahd over 20 surgeries but does not remember the timing of these surgeries.   Past Medical History  Diagnosis Date  . C. difficile colitis   . Hypertension   . Diabetes mellitus without complication   . Coronary artery disease    Past Surgical History  Procedure Laterality Date  . Abdominal hysterectomy    . Appendectomy    . Cholecystectomy    . ?Small bowel resection    . Coronary angioplasty with stent placement    . Partial colectomy for ?complicated diverticulitis Abdominal wall hernia with mesh repair Lysis of adhesions  Early September 2016   History reviewed. No pertinent family  history.  Social History:  reports that she has been smoking.  She does not have any smokeless tobacco history on file. She reports that she does not drink alcohol or use illicit drugs.  Allergies:  Allergies  Allergen Reactions  . Morphine And Related Nausea And Vomiting    emesis  . Nsaids Nausea And Vomiting    emesis  . Toradol [Ketorolac Tromethamine] Nausea And Vomiting   Medications: I have reviewed the patient's current medications.  Results for orders placed or performed during the hospital encounter of 05/25/15 (from the past 48 hour(s))  Urinalysis, Routine w reflex microscopic (not at [facility])     Status: Abnormal   Collection Time: 05/25/15  2:00 PM  Result Value Ref Range   Color, Urine AMBER (A) YELLOW    Comment: BIOCHEMICALS MAY BE AFFECTED BY COLOR   APPearance CLOUDY (A) CLEAR   Specific Gravity, Urine 1.024 1.005 - 1.030   pH 6.0 5.0 - 8.0   Glucose, UA NEGATIVE NEGATIVE mg/dL   Hgb urine dipstick TRACE (A) NEGATIVE   Bilirubin Urine SMALL (A) NEGATIVE   Ketones, ur NEGATIVE NEGATIVE mg/dL   Protein, ur NEGATIVE NEGATIVE mg/dL   Urobilinogen, UA 0.2 0.0 - 1.0 mg/dL   Nitrite NEGATIVE NEGATIVE   Leukocytes, UA SMALL (A) NEGATIVE  Urine culture     Status: None   Collection Time: 05/25/15  2:00 PM  Result Value Ref Range   Specimen Description URINE, CLEAN CATCH    Special Requests  NONE    Culture      MULTIPLE SPECIES PRESENT, SUGGEST RECOLLECTION Performed at [facility]    Report Status 05/26/2015 FINAL   Urine microscopic-add on     Status: Abnormal   Collection Time: 05/25/15  2:00 PM  Result Value Ref Range   Squamous Epithelial / LPF RARE RARE   WBC, UA 0-2 <3 WBC/hpf   RBC / HPF 0-2 <3 RBC/hpf   Casts HYALINE CASTS (A) NEGATIVE   Crystals CA OXALATE CRYSTALS (A) NEGATIVE   Urine-Other MUCOUS PRESENT   CBC with Differential/Platelet     Status: Abnormal   Collection Time: 05/25/15  2:06 PM  Result Value Ref Range   WBC 15.6 (H) 4.0 -  10.5 K/uL   RBC 4.20 3.87 - 5.11 MIL/uL   Hemoglobin 11.7 (L) 12.0 - 15.0 g/dL   HCT 35.6 (L) 36.0 - 46.0 %   MCV 84.8 78.0 - 100.0 fL   MCH 27.9 26.0 - 34.0 pg   MCHC 32.9 30.0 - 36.0 g/dL   RDW 15.1 11.5 - 15.5 %   Platelets 673 (H) 150 - 400 K/uL   Neutrophils Relative % 79 %   Neutro Abs 12.3 (H) 1.7 - 7.7 K/uL   Lymphocytes Relative 15 %   Lymphs Abs 2.4 0.7 - 4.0 K/uL   Monocytes Relative 6 %   Monocytes Absolute 0.9 0.1 - 1.0 K/uL   Eosinophils Relative 0 %   Eosinophils Absolute 0.1 0.0 - 0.7 K/uL   Basophils Relative 0 %   Basophils Absolute 0.0 0.0 - 0.1 K/uL  Comprehensive metabolic panel     Status: Abnormal   Collection Time: 05/25/15  2:06 PM  Result Value Ref Range   Sodium 136 135 - 145 mmol/L   Potassium 4.0 3.5 - 5.1 mmol/L   Chloride 100 (L) 101 - 111 mmol/L   CO2 23 22 - 32 mmol/L   Glucose, Bld 134 (H) 65 - 99 mg/dL   BUN 16 6 - 20 mg/dL   Creatinine, Ser 1.42 (H) 0.44 - 1.00 mg/dL   Calcium 9.7 8.9 - 10.3 mg/dL   Total Protein 7.1 6.5 - 8.1 g/dL   Albumin 3.3 (L) 3.5 - 5.0 g/dL   AST 31 15 - 41 U/L   ALT 12 (L) 14 - 54 U/L   Alkaline Phosphatase 120 38 - 126 U/L   Total Bilirubin 0.5 0.3 - 1.2 mg/dL   GFR calc non Af Amer 36 (L) >60 mL/min   GFR calc Af Amer 42 (L) >60 mL/min    Comment: (NOTE) The eGFR has been calculated using the CKD EPI equation. This calculation has not been validated in all clinical situations. eGFR's persistently <60 mL/min signify possible Chronic Kidney Disease.    Anion gap 13 5 - 15  Lipase, blood     Status: None   Collection Time: 05/25/15  2:06 PM  Result Value Ref Range   Lipase 31 22 - 51 U/L  I-Stat CG4 Lactic Acid, ED     Status: None   Collection Time: 05/25/15  3:00 PM  Result Value Ref Range   Lactic Acid, Venous 1.15 0.5 - 2.0 mmol/L  CBC     Status: Abnormal   Collection Time: 05/25/15  9:11 PM  Result Value Ref Range   WBC 8.9 4.0 - 10.5 K/uL   RBC 3.45 (L) 3.87 - 5.11 MIL/uL   Hemoglobin 9.7 (L)  12.0 - 15.0 g/dL   HCT 29.9 (L) 36.0 -  46.0 %   MCV 86.7 78.0 - 100.0 fL   MCH 28.1 26.0 - 34.0 pg   MCHC 32.4 30.0 - 36.0 g/dL   RDW 15.3 11.5 - 15.5 %   Platelets 542 (H) 150 - 400 K/uL  Type and screen     Status: None   Collection Time: 05/25/15  9:11 PM  Result Value Ref Range   ABO/RH(D) A NEG    Antibody Screen NEG    Sample Expiration 05/28/2015   ABO/Rh     Status: None   Collection Time: 05/25/15  9:11 PM  Result Value Ref Range   ABO/RH(D) A NEG   Glucose, capillary     Status: None   Collection Time: 05/25/15  9:29 PM  Result Value Ref Range   Glucose-Capillary 94 65 - 99 mg/dL   Comment 1 Notify RN   Basic metabolic panel     Status: Abnormal   Collection Time: 05/26/15  3:45 AM  Result Value Ref Range   Sodium 139 135 - 145 mmol/L   Potassium 4.6 3.5 - 5.1 mmol/L   Chloride 111 101 - 111 mmol/L   CO2 24 22 - 32 mmol/L   Glucose, Bld 97 65 - 99 mg/dL   BUN 10 6 - 20 mg/dL   Creatinine, Ser 1.05 (H) 0.44 - 1.00 mg/dL   Calcium 8.3 (L) 8.9 - 10.3 mg/dL   GFR calc non Af Amer 53 (L) >60 mL/min   GFR calc Af Amer >60 >60 mL/min    Comment: (NOTE) The eGFR has been calculated using the CKD EPI equation. This calculation has not been validated in all clinical situations. eGFR's persistently <60 mL/min signify possible Chronic Kidney Disease.    Anion gap 4 (L) 5 - 15  CBC     Status: Abnormal   Collection Time: 05/26/15  3:45 AM  Result Value Ref Range   WBC 7.2 4.0 - 10.5 K/uL   RBC 3.34 (L) 3.87 - 5.11 MIL/uL   Hemoglobin 9.2 (L) 12.0 - 15.0 g/dL   HCT 29.5 (L) 36.0 - 46.0 %   MCV 88.3 78.0 - 100.0 fL   MCH 27.5 26.0 - 34.0 pg   MCHC 31.2 30.0 - 36.0 g/dL   RDW 15.3 11.5 - 15.5 %   Platelets 550 (H) 150 - 400 K/uL  Glucose, capillary     Status: None   Collection Time: 05/26/15  7:30 AM  Result Value Ref Range   Glucose-Capillary 92 65 - 99 mg/dL   Comment 1 Notify RN    Comment 2 Document in Chart   Glucose, capillary     Status: Abnormal    Collection Time: 05/26/15 11:17 AM  Result Value Ref Range   Glucose-Capillary 123 (H) 65 - 99 mg/dL   Comment 1 Notify RN    Comment 2 Document in Chart   Glucose, capillary     Status: Abnormal   Collection Time: 05/26/15  4:54 PM  Result Value Ref Range   Glucose-Capillary 107 (H) 65 - 99 mg/dL   Comment 1 Notify RN    Comment 2 Document in Chart   CBC     Status: Abnormal   Collection Time: 05/26/15  5:55 PM  Result Value Ref Range   WBC 7.1 4.0 - 10.5 K/uL   RBC 3.37 (L) 3.87 - 5.11 MIL/uL   Hemoglobin 9.4 (L) 12.0 - 15.0 g/dL   HCT 29.4 (L) 36.0 - 46.0 %   MCV 87.2 78.0 - 100.0  fL   MCH 27.9 26.0 - 34.0 pg   MCHC 32.0 30.0 - 36.0 g/dL   RDW 15.1 11.5 - 15.5 %   Platelets 540 (H) 150 - 400 K/uL   Review of Systems  Constitutional: Positive for weight loss and malaise/fatigue. Negative for fever, chills and diaphoresis.  Eyes: Negative.   Respiratory: Negative.   Cardiovascular: Negative.   Gastrointestinal: Positive for heartburn, abdominal pain, diarrhea and blood in stool. Negative for nausea, vomiting and constipation.  Skin: Negative.   Neurological: Positive for weakness.  Endo/Heme/Allergies: Negative.   Psychiatric/Behavioral: Negative.    Blood pressure 120/75, pulse 76, temperature 97.8 F (36.6 C), temperature source Oral, resp. rate 20, height 5' 0.5" (1.537 m), weight 59.194 kg (130 lb 8 oz), SpO2 100 %. Physical Exam  Constitutional: She is oriented to person, place, and time. She appears well-developed and well-nourished.  HENT:  Head: Normocephalic and atraumatic.  Eyes: Conjunctivae and EOM are normal. Pupils are equal, round, and reactive to light.  Neck: Normal range of motion. Neck supple.  Cardiovascular: Normal rate and regular rhythm.   GI: Soft. Bowel sounds are normal. She exhibits no distension and no mass. There is tenderness. There is no rebound and no guarding.  Musculoskeletal: Normal range of motion.  Neurological: She is alert and  oriented to person, place, and time.  Skin: Skin is warm and dry.  Psychiatric: She has a normal mood and affect. Her behavior is normal. Judgment and thought content normal.   Assessment/Plan: 1) Change in bowel habits with rectal bleeding x 2 days-rule out recurrent C. Difficile colitis vs ischemic colitis [30 lb weight loss with post-prandial abdominal pain and sitophobia point to an ischemic etiology] Will plan to do a colonoscopy tomorrow. I think it would be helpful to recheck her stool for C.Difficle toxin assay. She has a 50 pack year history of smoking and has 50% stenosis of her celiac axis. A CTA would also be very helpful.  2) GERD/Hiatal hernia on PPI's [Aciphex] for several years. 3) Anemia of chronic disease.  4) History of a partial colectomy for complicated diverticulitis.  5) History of small bowel obstruction. 5) History of colonic polyps.  6) History of Polymyalgia Rheumatica on Prednisone.  7) AODM-currently on SSC.  8) HTN/CAD. 9) Tobacco abuse/COPD.  MANN,JYOTHI 05/26/2015, 6:27 PM

## 2015-05-27 ENCOUNTER — Encounter (HOSPITAL_COMMUNITY): Payer: Self-pay | Admitting: *Deleted

## 2015-05-27 ENCOUNTER — Encounter (HOSPITAL_COMMUNITY)
Admission: EM | Disposition: A | Discharge status: Home / Self Care | Payer: Self-pay | Source: Home / Self Care | Attending: Internal Medicine

## 2015-05-27 DIAGNOSIS — K550 Acute vascular disorders of intestine: Secondary | ICD-10-CM

## 2015-05-27 HISTORY — PX: COLONOSCOPY: SHX5424

## 2015-05-27 LAB — BASIC METABOLIC PANEL
Anion gap: 5 (ref 5–15)
BUN: 5 mg/dL — ABNORMAL LOW (ref 6–20)
CALCIUM: 8.5 mg/dL — AB (ref 8.9–10.3)
CHLORIDE: 107 mmol/L (ref 101–111)
CO2: 25 mmol/L (ref 22–32)
CREATININE: 0.72 mg/dL (ref 0.44–1.00)
Glucose, Bld: 107 mg/dL — ABNORMAL HIGH (ref 65–99)
Potassium: 5 mmol/L (ref 3.5–5.1)
SODIUM: 137 mmol/L (ref 135–145)

## 2015-05-27 LAB — GLUCOSE, CAPILLARY
GLUCOSE-CAPILLARY: 83 mg/dL (ref 65–99)
GLUCOSE-CAPILLARY: 79 mg/dL (ref 65–99)
GLUCOSE-CAPILLARY: 83 mg/dL (ref 65–99)
Glucose-Capillary: 70 mg/dL (ref 65–99)

## 2015-05-27 LAB — GI PATHOGEN PANEL BY PCR, STOOL
C DIFFICILE TOXIN A/B: NOT DETECTED
CRYPTOSPORIDIUM BY PCR: NOT DETECTED
Campylobacter by PCR: NOT DETECTED
E COLI (ETEC) LT/ST: NOT DETECTED
E COLI (STEC): NOT DETECTED
E coli 0157 by PCR: NOT DETECTED
G lamblia by PCR: NOT DETECTED
Norovirus GI/GII: NOT DETECTED
ROTAVIRUS A BY PCR: NOT DETECTED
Salmonella by PCR: NOT DETECTED
Shigella by PCR: NOT DETECTED

## 2015-05-27 LAB — CBC
HCT: 28.5 % — ABNORMAL LOW (ref 36.0–46.0)
HEMOGLOBIN: 9 g/dL — AB (ref 12.0–15.0)
MCH: 27.7 pg (ref 26.0–34.0)
MCHC: 31.6 g/dL (ref 30.0–36.0)
MCV: 87.7 fL (ref 78.0–100.0)
PLATELETS: 510 10*3/uL — AB (ref 150–400)
RBC: 3.25 MIL/uL — ABNORMAL LOW (ref 3.87–5.11)
RDW: 15.1 % (ref 11.5–15.5)
WBC: 5.4 10*3/uL (ref 4.0–10.5)

## 2015-05-27 LAB — C DIFFICILE QUICK SCREEN W PCR REFLEX
C DIFFICILE (CDIFF) INTERP: NEGATIVE
C Diff antigen: NEGATIVE
C Diff toxin: NEGATIVE

## 2015-05-27 SURGERY — COLONOSCOPY
Anesthesia: Moderate Sedation | Laterality: Left

## 2015-05-27 MED ORDER — SODIUM CHLORIDE 0.9 % IV SOLN
INTRAVENOUS | Status: AC
  Administered 2015-05-27: 11:00:00 via INTRAVENOUS
  Administered 2015-05-27: 250 mL via INTRAVENOUS
  Administered 2015-05-28: 02:00:00 via INTRAVENOUS

## 2015-05-27 MED ORDER — DIPHENHYDRAMINE HCL 50 MG/ML IJ SOLN
INTRAMUSCULAR | Status: DC | PRN
  Administered 2015-05-27: 12.5 mg via INTRAVENOUS

## 2015-05-27 MED ORDER — HYDROCODONE-ACETAMINOPHEN 7.5-325 MG PO TABS: 1.0000 | ORAL_TABLET | Freq: Four times a day (QID) | ORAL | Status: DC | PRN

## 2015-05-27 MED ORDER — DIPHENHYDRAMINE HCL 50 MG/ML IJ SOLN
INTRAMUSCULAR | Status: AC
  Filled 2015-05-27: qty 1

## 2015-05-27 MED ORDER — HYDROCODONE-ACETAMINOPHEN 7.5-325 MG PO TABS
1.0000 | ORAL_TABLET | Freq: Four times a day (QID) | ORAL | Status: DC | PRN
  Administered 2015-05-27 – 2015-05-30 (×11): 1 via ORAL
  Filled 2015-05-27 (×12): qty 1

## 2015-05-27 MED ORDER — MIDAZOLAM HCL 5 MG/ML IJ SOLN
INTRAMUSCULAR | Status: AC
  Filled 2015-05-27: qty 2

## 2015-05-27 MED ORDER — FENTANYL CITRATE (PF) 100 MCG/2ML IJ SOLN
INTRAMUSCULAR | Status: AC
  Filled 2015-05-27: qty 2

## 2015-05-27 MED ORDER — MIDAZOLAM HCL 5 MG/5ML IJ SOLN
INTRAMUSCULAR | Status: DC | PRN
  Administered 2015-05-27 (×2): 2 mg via INTRAVENOUS

## 2015-05-27 MED ORDER — FENTANYL CITRATE (PF) 100 MCG/2ML IJ SOLN
INTRAMUSCULAR | Status: DC | PRN
  Administered 2015-05-27 (×2): 25 ug via INTRAVENOUS

## 2015-05-27 NOTE — H&P (View-Only) (Signed)
UNASSIGNED PATIENT Reason for Consult: Rectal bleeding and diarrhea ?recurrent C. Difficile colitis. Referring Physician: [facility]-Dr. Verner Mould.  Kimberly Downs is an 69 y.o. female.  HPI: 69 year old white female, who was visiting her son here in [place], originally a resident of [place], developed C. Difficile colitis in early July and claims this is her 5th hospitalization for it. She claims for the last 3 months she has been treated with several courses ov Vancomycin with probiotics but her symptoms keep recurring. Since yesterday, she has had a lot of rectal bleeding, which she claims has never happened before. To the best of my ability, her records reveal normal colonic mucosa on biopsies from a colonoscopy done in January this year. She claims she had another colonoscopy 2 weeks ago but we do not have that operative report. She has severe post-prandial abdominal pain with a 30 lb weight loss in 3 months and sitophobia. In fact before she had the C. Difficile colitis, she was being worked up for abdominal pain, worse after meals. She has also had colonic polyps removed in the past but she denies a family history of colon cancer. She has had reflux for a long time and has a large hiatal hernia. There is no history of dysphagia or odynophagia. She denies having any melenic stools. She claims she has ahd over 20 surgeries but does not remember the timing of these surgeries.   Past Medical History  Diagnosis Date  . C. difficile colitis   . Hypertension   . Diabetes mellitus without complication   . Coronary artery disease    Past Surgical History  Procedure Laterality Date  . Abdominal hysterectomy    . Appendectomy    . Cholecystectomy    . ?Small bowel resection    . Coronary angioplasty with stent placement    . Partial colectomy for ?complicated diverticulitis Abdominal wall hernia with mesh repair Lysis of adhesions  Early September 2016   History reviewed. No pertinent family  history.  Social History:  reports that she has been smoking.  She does not have any smokeless tobacco history on file. She reports that she does not drink alcohol or use illicit drugs.  Allergies:  Allergies  Allergen Reactions  . Morphine And Related Nausea And Vomiting    emesis  . Nsaids Nausea And Vomiting    emesis  . Toradol [Ketorolac Tromethamine] Nausea And Vomiting   Medications: I have reviewed the patient's current medications.  Results for orders placed or performed during the hospital encounter of 05/25/15 (from the past 48 hour(s))  Urinalysis, Routine w reflex microscopic (not at [facility])     Status: Abnormal   Collection Time: 05/25/15  2:00 PM  Result Value Ref Range   Color, Urine AMBER (A) YELLOW    Comment: BIOCHEMICALS MAY BE AFFECTED BY COLOR   APPearance CLOUDY (A) CLEAR   Specific Gravity, Urine 1.024 1.005 - 1.030   pH 6.0 5.0 - 8.0   Glucose, UA NEGATIVE NEGATIVE mg/dL   Hgb urine dipstick TRACE (A) NEGATIVE   Bilirubin Urine SMALL (A) NEGATIVE   Ketones, ur NEGATIVE NEGATIVE mg/dL   Protein, ur NEGATIVE NEGATIVE mg/dL   Urobilinogen, UA 0.2 0.0 - 1.0 mg/dL   Nitrite NEGATIVE NEGATIVE   Leukocytes, UA SMALL (A) NEGATIVE  Urine culture     Status: None   Collection Time: 05/25/15  2:00 PM  Result Value Ref Range   Specimen Description URINE, CLEAN CATCH    Special Requests  NONE    Culture      MULTIPLE SPECIES PRESENT, SUGGEST RECOLLECTION Performed at [facility]    Report Status 05/26/2015 FINAL   Urine microscopic-add on     Status: Abnormal   Collection Time: 05/25/15  2:00 PM  Result Value Ref Range   Squamous Epithelial / LPF RARE RARE   WBC, UA 0-2 <3 WBC/hpf   RBC / HPF 0-2 <3 RBC/hpf   Casts HYALINE CASTS (A) NEGATIVE   Crystals CA OXALATE CRYSTALS (A) NEGATIVE   Urine-Other MUCOUS PRESENT   CBC with Differential/Platelet     Status: Abnormal   Collection Time: 05/25/15  2:06 PM  Result Value Ref Range   WBC 15.6 (H) 4.0 -  10.5 K/uL   RBC 4.20 3.87 - 5.11 MIL/uL   Hemoglobin 11.7 (L) 12.0 - 15.0 g/dL   HCT 35.6 (L) 36.0 - 46.0 %   MCV 84.8 78.0 - 100.0 fL   MCH 27.9 26.0 - 34.0 pg   MCHC 32.9 30.0 - 36.0 g/dL   RDW 15.1 11.5 - 15.5 %   Platelets 673 (H) 150 - 400 K/uL   Neutrophils Relative % 79 %   Neutro Abs 12.3 (H) 1.7 - 7.7 K/uL   Lymphocytes Relative 15 %   Lymphs Abs 2.4 0.7 - 4.0 K/uL   Monocytes Relative 6 %   Monocytes Absolute 0.9 0.1 - 1.0 K/uL   Eosinophils Relative 0 %   Eosinophils Absolute 0.1 0.0 - 0.7 K/uL   Basophils Relative 0 %   Basophils Absolute 0.0 0.0 - 0.1 K/uL  Comprehensive metabolic panel     Status: Abnormal   Collection Time: 05/25/15  2:06 PM  Result Value Ref Range   Sodium 136 135 - 145 mmol/L   Potassium 4.0 3.5 - 5.1 mmol/L   Chloride 100 (L) 101 - 111 mmol/L   CO2 23 22 - 32 mmol/L   Glucose, Bld 134 (H) 65 - 99 mg/dL   BUN 16 6 - 20 mg/dL   Creatinine, Ser 1.42 (H) 0.44 - 1.00 mg/dL   Calcium 9.7 8.9 - 10.3 mg/dL   Total Protein 7.1 6.5 - 8.1 g/dL   Albumin 3.3 (L) 3.5 - 5.0 g/dL   AST 31 15 - 41 U/L   ALT 12 (L) 14 - 54 U/L   Alkaline Phosphatase 120 38 - 126 U/L   Total Bilirubin 0.5 0.3 - 1.2 mg/dL   GFR calc non Af Amer 36 (L) >60 mL/min   GFR calc Af Amer 42 (L) >60 mL/min    Comment: (NOTE) The eGFR has been calculated using the CKD EPI equation. This calculation has not been validated in all clinical situations. eGFR's persistently <60 mL/min signify possible Chronic Kidney Disease.    Anion gap 13 5 - 15  Lipase, blood     Status: None   Collection Time: 05/25/15  2:06 PM  Result Value Ref Range   Lipase 31 22 - 51 U/L  I-Stat CG4 Lactic Acid, ED     Status: None   Collection Time: 05/25/15  3:00 PM  Result Value Ref Range   Lactic Acid, Venous 1.15 0.5 - 2.0 mmol/L  CBC     Status: Abnormal   Collection Time: 05/25/15  9:11 PM  Result Value Ref Range   WBC 8.9 4.0 - 10.5 K/uL   RBC 3.45 (L) 3.87 - 5.11 MIL/uL   Hemoglobin 9.7 (L)  12.0 - 15.0 g/dL   HCT 29.9 (L) 36.0 -   46.0 %   MCV 86.7 78.0 - 100.0 fL   MCH 28.1 26.0 - 34.0 pg   MCHC 32.4 30.0 - 36.0 g/dL   RDW 15.3 11.5 - 15.5 %   Platelets 542 (H) 150 - 400 K/uL  Type and screen     Status: None   Collection Time: 05/25/15  9:11 PM  Result Value Ref Range   ABO/RH(D) A NEG    Antibody Screen NEG    Sample Expiration 05/28/2015   ABO/Rh     Status: None   Collection Time: 05/25/15  9:11 PM  Result Value Ref Range   ABO/RH(D) A NEG   Glucose, capillary     Status: None   Collection Time: 05/25/15  9:29 PM  Result Value Ref Range   Glucose-Capillary 94 65 - 99 mg/dL   Comment 1 Notify RN   Basic metabolic panel     Status: Abnormal   Collection Time: 05/26/15  3:45 AM  Result Value Ref Range   Sodium 139 135 - 145 mmol/L   Potassium 4.6 3.5 - 5.1 mmol/L   Chloride 111 101 - 111 mmol/L   CO2 24 22 - 32 mmol/L   Glucose, Bld 97 65 - 99 mg/dL   BUN 10 6 - 20 mg/dL   Creatinine, Ser 1.05 (H) 0.44 - 1.00 mg/dL   Calcium 8.3 (L) 8.9 - 10.3 mg/dL   GFR calc non Af Amer 53 (L) >60 mL/min   GFR calc Af Amer >60 >60 mL/min    Comment: (NOTE) The eGFR has been calculated using the CKD EPI equation. This calculation has not been validated in all clinical situations. eGFR's persistently <60 mL/min signify possible Chronic Kidney Disease.    Anion gap 4 (L) 5 - 15  CBC     Status: Abnormal   Collection Time: 05/26/15  3:45 AM  Result Value Ref Range   WBC 7.2 4.0 - 10.5 K/uL   RBC 3.34 (L) 3.87 - 5.11 MIL/uL   Hemoglobin 9.2 (L) 12.0 - 15.0 g/dL   HCT 29.5 (L) 36.0 - 46.0 %   MCV 88.3 78.0 - 100.0 fL   MCH 27.5 26.0 - 34.0 pg   MCHC 31.2 30.0 - 36.0 g/dL   RDW 15.3 11.5 - 15.5 %   Platelets 550 (H) 150 - 400 K/uL  Glucose, capillary     Status: None   Collection Time: 05/26/15  7:30 AM  Result Value Ref Range   Glucose-Capillary 92 65 - 99 mg/dL   Comment 1 Notify RN    Comment 2 Document in Chart   Glucose, capillary     Status: Abnormal    Collection Time: 05/26/15 11:17 AM  Result Value Ref Range   Glucose-Capillary 123 (H) 65 - 99 mg/dL   Comment 1 Notify RN    Comment 2 Document in Chart   Glucose, capillary     Status: Abnormal   Collection Time: 05/26/15  4:54 PM  Result Value Ref Range   Glucose-Capillary 107 (H) 65 - 99 mg/dL   Comment 1 Notify RN    Comment 2 Document in Chart   CBC     Status: Abnormal   Collection Time: 05/26/15  5:55 PM  Result Value Ref Range   WBC 7.1 4.0 - 10.5 K/uL   RBC 3.37 (L) 3.87 - 5.11 MIL/uL   Hemoglobin 9.4 (L) 12.0 - 15.0 g/dL   HCT 29.4 (L) 36.0 - 46.0 %   MCV 87.2 78.0 - 100.0  fL   MCH 27.9 26.0 - 34.0 pg   MCHC 32.0 30.0 - 36.0 g/dL   RDW 15.1 11.5 - 15.5 %   Platelets 540 (H) 150 - 400 K/uL   Review of Systems  Constitutional: Positive for weight loss and malaise/fatigue. Negative for fever, chills and diaphoresis.  Eyes: Negative.   Respiratory: Negative.   Cardiovascular: Negative.   Gastrointestinal: Positive for heartburn, abdominal pain, diarrhea and blood in stool. Negative for nausea, vomiting and constipation.  Skin: Negative.   Neurological: Positive for weakness.  Endo/Heme/Allergies: Negative.   Psychiatric/Behavioral: Negative.    Blood pressure 120/75, pulse 76, temperature 97.8 F (36.6 C), temperature source Oral, resp. rate 20, height 5' 0.5" (1.537 m), weight 59.194 kg (130 lb 8 oz), SpO2 100 %. Physical Exam  Constitutional: She is oriented to person, place, and time. She appears well-developed and well-nourished.  HENT:  Head: Normocephalic and atraumatic.  Eyes: Conjunctivae and EOM are normal. Pupils are equal, round, and reactive to light.  Neck: Normal range of motion. Neck supple.  Cardiovascular: Normal rate and regular rhythm.   GI: Soft. Bowel sounds are normal. She exhibits no distension and no mass. There is tenderness. There is no rebound and no guarding.  Musculoskeletal: Normal range of motion.  Neurological: She is alert and  oriented to person, place, and time.  Skin: Skin is warm and dry.  Psychiatric: She has a normal mood and affect. Her behavior is normal. Judgment and thought content normal.   Assessment/Plan: 1) Change in bowel habits with rectal bleeding x 2 days-rule out recurrent C. Difficile colitis vs ischemic colitis [30 lb weight loss with post-prandial abdominal pain and sitophobia point to an ischemic etiology] Will plan to do a colonoscopy tomorrow. I think it would be helpful to recheck her stool for C.Difficle toxin assay. She has a 50 pack year history of smoking and has 50% stenosis of her celiac axis. A CTA would also be very helpful.  2) GERD/Hiatal hernia on PPI's [Aciphex] for several years. 3) Anemia of chronic disease.  4) History of a partial colectomy for complicated diverticulitis.  5) History of small bowel obstruction. 5) History of colonic polyps.  6) History of Polymyalgia Rheumatica on Prednisone.  7) AODM-currently on SSC.  8) HTN/CAD. 9) Tobacco abuse/COPD.  MANN,JYOTHI 05/26/2015, 6:27 PM

## 2015-05-27 NOTE — Interval H&P Note (Signed)
History and Physical Interval Note:  05/27/2015 1:54 PM  Kimberly Downs  has presented today for surgery, with the diagnosis of RECTAL BLEEDING AND DIARRHEA-?ISCHEMIC COLITIS C. DIFFICLE  The various methods of treatment have been discussed with the patient and family. After consideration of risks, benefits and other options for treatment, the patient has consented to  Procedure(s): COLONOSCOPY (Left) as a surgical intervention .  The patient's history has been reviewed, patient examined, no change in status, stable for surgery.  I have reviewed the patient's chart and labs.  Questions were answered to the patient's satisfaction.     HUNG,PATRICK D

## 2015-05-27 NOTE — Op Note (Signed)
[facility] [place], [address]   COLONOSCOPY PROCEDURE REPORT  PATIENT: Kimberly Downs, Kimberly Downs  MR#: [id number] BIRTHDATE: 01-18-1946 , 76  yrs. old GENDER: female ENDOSCOPIST: Carol Ada, MD REFERRED BY: PROCEDURE DATE:  2015-06-10 PROCEDURE:   Colonoscopy with biopsy ASA CLASS:   Class III INDICATIONS: Chronic diarrhea MEDICATIONS: Versed 4 mg IV, Fentanyl 50 mcg IV, and Benadryl 12.5 mg IV  DESCRIPTION OF PROCEDURE:   After the risks and benefits and of the procedure were explained, informed consent was obtained.  revealed no abnormalities of the rectum.    The EC-[id number] ([id number]) endoscope was introduced through the anus and advanced to the cecum, which was identified by both the appendix and ileocecal valve .  The quality of the prep was good. .  The instrument was then slowly withdrawn as the colon was fully examined. Estimated blood loss is zero unless otherwise noted in this procedure report.   FINDINGS:  Attempts were made to intubate the TI, but the colonoscope was not able to pass into the TI.  A brief glimpse of the lumen did not reveal any overt inflammation in the mucosa.  The colonic mucosa was essentally normal, but random cold biopsies were obtained.  A few scattered diverticula were noted in the left side of the colon.  Ten centimeters from the anal verge an end -to-side anastamosis was identified.     Retroflexed views were not performed as the rectum was smaller in caliber.     The scope was then withdrawn from the patient and the procedure completed.  WITHDRAWAL TIME: 14 minutes.  COMPLICATIONS: There were no immediate complications. ENDOSCOPIC IMPRESSION: 1) End-to-side anastamosis at 10 cm above the dentate line. 2) Left sided diverticula.  RECOMMENDATIONS: 1) Follow up biopsy results.  REPEAT EXAM:  cc:  _______________________________ eSignedCarol Ada, MD June 10, 2015 3:08 PM   CPT CODES: ICD CODES:  The  ICD and CPT codes recommended by this software are interpretations from the data that the clinical staff has captured with the software.  The verification of the translation of this report to the ICD and CPT codes and modifiers is the sole responsibility of the health care institution and practicing physician where this report was generated.  [employer]. will not be held responsible for the validity of the ICD and CPT codes included on this report.  AMA assumes no liability for data contained or not contained herein. CPT is a Designer, television/film set of the [employer].

## 2015-05-27 NOTE — Progress Notes (Signed)
PROGRESS NOTE    Kimberly Downs MVH:[id number] DOB: 05/02/1946 DOA: 05/25/2015 PCP: No primary care provider on file. Dr.[name] in [facility] Out[facility] Specialists:  1. GI: Dr. Lindaann Slough  HPI/Brief narrative 69 y.o. female , widowed, independent of activities at home and uses wheelchair walker when she steps out of her home, lives in [place], was visiting son in [place] for the last 4 days, PMH of recurrent severe C. difficile (3 since 02/28/2015), DM 2, HTN, CAD status post stent, PMR on chronic prednisone, ongoing tobacco abuse, presented to the [facility] ED on 05/25/15 with complaints of profuse diarrhea with some blood, abdominal pain, nausea and vomiting. In the ED, hemodynamically stable, lab work significant for creatinine 1.4 to WBC 15.6 hemoglobin 11.7, platelets 673. No baseline labs to compare. Hospitalized for suspected recurrent C. difficile colitis, dehydration and acute kidney injury. GI/Dr. Collene Mares consulted-suspecting ischemic colitis. Plan for colonoscopy 9/27+/- CTA abdomen   Assessment/Plan:  Recurrent C. difficile colitis versus ischemic colitis - As per patient, she has had 3 prior hospitalizations since 02/28/2015 for severe C. difficile colitis. Underwent colonoscopy approximately 2 weeks ago confirming the diagnosis. Claims compliance with her oral vancomycin and probiotics - Repeat C. difficile PCR: Negative - Follow stool culture (in process) and GI pathogen panel PCR (in process).  - Discussed with infectious disease M.D. on call 9/25: Recommended continuing current dose of oral vancomycin, increase Florastor 250 MG twice a day and monitor closely. Do not see a role to add Xifaxan mean or increasing the dose of oral vancomycin - Continues to have frequent diarrhea & intermittently bloody, abdominal pain. - Briefly reviewed the reports from her outside GI (poor quality and difficult to read): Ileocecal valve biopsy: Unremarkable colonic  mucosa. No evidence of active or microscopic colitis (09/17/13), colonoscopy 09/07/13: Preparation not at optimal, unable to intubate the ileum, MR enterography versus laparoscopic biopsy of the ileum recommended. EGD biopsy 09/17/13: Gastritis, biliary, irregular reflux. As per GI note 11/02/12, patient having abdominal pain-worse on eating and CTA showed diffuse atherosclerosis and 50% narrowing of celiac, she is status post ventral hernia repair - GI/Dr. Collene Mares consulted-input appreciated. Suspects ischemic colitis. Plans for colonoscopy 9/27 and possible CTA abdomen.  Dehydration - Due to GI losses and poor oral intake. Dehydration resolved. Continue IV fluids while she is nothing by mouth and having bloody diarrhea.  Acute kidney injury - Due to dehydration and ACEI. Hold ACEI and Lasix. Resolved after IV fluids. Continue IV fluids.  Essential hypertension - Hold ACEI. Continue Cardizem. Controlled.  Anemia - Baseline hemoglobin not known. Hemoglobin has gradually dropped to 11.7 > 9.7 > 9.2. Follow CBC closely and transfuse if hemoglobin <8 g per DL. Stable.  Type II DM with renal complications - SSI. Not on medications at home. Controlled  CAD status post PCI - Asymptomatic of chest pain  Tobacco abuse - Cessation counseled.  Severe malnutrition in context of acute illness/injury - Management per dietitian.  PMR on chronic prednisone - doubled the dose of prednisone for stressful situation.  Reactive thrombocytosis   DVT prophylaxis: SCDs Code Status: Full  Family Communication: Discussed with son at bedside on 9/25. None at bedside today. Disposition Plan: DC home when medically stable, possibly in 3-4 days.    Consultants:  GI/Dr. Collene Mares   Procedures:  None  Antibiotics:  Oral vancomycin 9/25 (had been on this prior to admission) >  Subjective: Abdominal pain controlled on current by mouth/IV meds. Pain comes down from  8/10 today to 2/10. Still having frequent  bloody diarrhea. No nausea or vomiting.  Objective: Filed Vitals:   05/26/15 0702 05/26/15 1450 05/26/15 2056 05/27/15 0454  BP: 101/54 120/75 115/92 110/47  Pulse: 70 76 72 56  Temp: 98.2 F (36.8 C) 97.8 F (36.6 C) 98 F (36.7 C) 97.6 F (36.4 C)  TempSrc: Oral Oral Oral Oral  Resp: '20 20 20 20  '$ Height:      Weight:    59.058 kg (130 lb 3.2 oz)  SpO2: 99% 100% 96% 100%    Intake/Output Summary (Last 24 hours) at 05/27/15 0855 Last data filed at 05/26/15 1200  Gross per 24 hour  Intake    240 ml  Output      0 ml  Net    240 ml   Filed Weights   05/25/15 1640 05/27/15 0454  Weight: 59.194 kg (130 lb 8 oz) 59.058 kg (130 lb 3.2 oz)     Exam:  General exam: Pleasant elderly female sitting up comfortably in bed this morning. Appears pleasant and comfortable. Mucosa moist. Respiratory system: Clear. No increased work of breathing. Cardiovascular system: S1 & S2 heard, RRR. No JVD, murmurs, gallops, clicks or pedal edema. Gastrointestinal system: Abdomen is nondistended, soft. Tenderness mostly in the lower quadrants without peritoneal signs. Normal bowel sounds heard. Central nervous system: Alert and oriented. No focal neurological deficits. Extremities: Symmetric 5 x 5 power.   Data Reviewed: Basic Metabolic Panel:  Recent Labs Lab 05/25/15 1406 05/26/15 0345 05/27/15 0414  NA 136 139 137  K 4.0 4.6 5.0  CL 100* 111 107  CO2 '23 24 25  '$ GLUCOSE 134* 97 107*  BUN 16 10 5*  CREATININE 1.42* 1.05* 0.72  CALCIUM 9.7 8.3* 8.5*   Liver Function Tests:  Recent Labs Lab 05/25/15 1406  AST 31  ALT 12*  ALKPHOS 120  BILITOT 0.5  PROT 7.1  ALBUMIN 3.3*    Recent Labs Lab 05/25/15 1406  LIPASE 31   No results for input(s): AMMONIA in the last 168 hours. CBC:  Recent Labs Lab 05/25/15 1406 05/25/15 2111 05/26/15 0345 05/26/15 1755 05/27/15 0414  WBC 15.6* 8.9 7.2 7.1 5.4  NEUTROABS 12.3*  --   --   --   --   HGB 11.7* 9.7* 9.2* 9.4* 9.0*  HCT  35.6* 29.9* 29.5* 29.4* 28.5*  MCV 84.8 86.7 88.3 87.2 87.7  PLT 673* 542* 550* 540* 510*   Cardiac Enzymes: No results for input(s): CKTOTAL, CKMB, CKMBINDEX, TROPONINI in the last 168 hours. BNP (last 3 results) No results for input(s): PROBNP in the last 8760 hours. CBG:  Recent Labs Lab 05/26/15 0730 05/26/15 1117 05/26/15 1654 05/26/15 2100 05/27/15 0747  GLUCAP 92 123* 107* 117* 83    Recent Results (from the past 240 hour(s))  Urine culture     Status: None   Collection Time: 05/25/15  2:00 PM  Result Value Ref Range Status   Specimen Description URINE, CLEAN CATCH  Final   Special Requests NONE  Final   Culture   Final    MULTIPLE SPECIES PRESENT, SUGGEST RECOLLECTION Performed at [facility]    Report Status 05/26/2015 FINAL  Final  C difficile quick scan w PCR reflex     Status: None   Collection Time: 05/26/15 11:35 PM  Result Value Ref Range Status   C Diff antigen NEGATIVE NEGATIVE Final   C Diff toxin NEGATIVE NEGATIVE Final   C Diff interpretation Negative for toxigenic  C. difficile  Final        Studies: No results found.      Scheduled Meds: . diltiazem  240 mg Oral Daily  . insulin aspart  0-9 Units Subcutaneous TID WC  . isosorbide mononitrate  15 mg Oral Daily  . magnesium oxide  400 mg Oral BID  . pentoxifylline  400 mg Oral Daily  . predniSONE  10 mg Oral Q breakfast  . pregabalin  50 mg Oral Daily  . saccharomyces boulardii  250 mg Oral BID  . vancomycin  250 mg Oral Q6H   Continuous Infusions:    Principal Problem:   Clostridium difficile diarrhea Active Problems:   Dehydration   Acute kidney injury   Anemia   DM (diabetes mellitus), type 2 with renal complications   Essential hypertension   CAD (coronary artery disease)   Tobacco abuse   Recurrent colitis due to Clostridium difficile    Time spent: 25 minutes.    Vernell Leep, MD, FACP, FHM. [facility] Pager [phone]  If 7PM-7AM, please  contact night-coverage [email or web address] Password [email or web address] 05/27/2015, 8:55 AM    LOS: 2 days

## 2015-05-27 NOTE — Care Management Important Message (Signed)
Important Message  Patient Details IM Letter given to Kimberly Downs to present to Patient.Important Message  Patient Details  Name: Kimberly Downs MRN: [id number] Date of Birth: [birth year]   Medicare Important Message Given:  Kimberly Downs notification given    Kimberly Downs 05/27/2015, 11:53 AM Name: Kimberly Downs MRN: [id number] Date of Birth: 26-Aug-1946   Medicare Important Message Given:  Yes-second notification given    Kimberly Downs 05/27/2015, 11:53 AM

## 2015-05-28 LAB — GLUCOSE, CAPILLARY
GLUCOSE-CAPILLARY: 89 mg/dL (ref 65–99)
Glucose-Capillary: 142 mg/dL — ABNORMAL HIGH (ref 65–99)
Glucose-Capillary: 131 mg/dL — ABNORMAL HIGH (ref 65–99)

## 2015-05-28 LAB — BASIC METABOLIC PANEL
ANION GAP: 8 (ref 5–15)
CHLORIDE: 109 mmol/L (ref 101–111)
CO2: 23 mmol/L (ref 22–32)
Calcium: 8.6 mg/dL — ABNORMAL LOW (ref 8.9–10.3)
Creatinine, Ser: 0.73 mg/dL (ref 0.44–1.00)
Glucose, Bld: 110 mg/dL — ABNORMAL HIGH (ref 65–99)
POTASSIUM: 4.5 mmol/L (ref 3.5–5.1)
Sodium: 140 mmol/L (ref 135–145)

## 2015-05-28 LAB — CBC
HCT: 29.3 % — ABNORMAL LOW (ref 36.0–46.0)
HEMOGLOBIN: 9.2 g/dL — AB (ref 12.0–15.0)
MCH: 27.5 pg (ref 26.0–34.0)
MCHC: 31.4 g/dL (ref 30.0–36.0)
MCV: 87.7 fL (ref 78.0–100.0)
PLATELETS: 475 10*3/uL — AB (ref 150–400)
RBC: 3.34 MIL/uL — AB (ref 3.87–5.11)
RDW: 15.4 % (ref 11.5–15.5)
WBC: 6.5 10*3/uL (ref 4.0–10.5)

## 2015-05-28 MED ORDER — ENSURE ENLIVE PO LIQD: 237.0000 mL | Freq: Two times a day (BID) | ORAL | Status: DC

## 2015-05-28 NOTE — Care Management Note (Signed)
Case Management Note  Patient Details  Name: Kimberly Downs MRN: [id number] Date of Birth: [birth year]  Subjective/Objective:        69 yo admitted with C-diff             Action/Plan: From home alone in [place] but visiting son in [place].  Expected Discharge Date:                  Expected Discharge Plan:  Home/Self Care  In-House Referral:     Discharge planning Services  CM Consult  Post Acute Care Choice:    Choice offered to:     DME Arranged:    DME Agency:     HH Arranged:    HH Agency:     Status of Service:  In process, will continue to follow  Medicare Important Message Given:  Yes-second notification given Date Medicare IM Given:    Medicare IM give by:    Date Additional Medicare IM Given:    Additional Medicare Important Message give by:     If discussed at [place] of Stay Meetings, dates discussed:    Additional Comments: Chart reviewed and CM following for DC needs. Lynnell Catalan, RN 05/28/2015, 10:39 AM

## 2015-05-28 NOTE — Progress Notes (Signed)
UNASSIGNED PATIENT Subjective: Since I last evaluated the patient, she seems to be doing somewhat better. She continues to have severe abdominal pain worse post-prandially. Her stools are negative for C. Difficile toxin assay and other enteric pathogens. She denies having any more rectal bleeding. She has had 3 loose BM's today. She feels the Norco is not helping.   Objective: Vital signs in last 24 hours: Temp:  [97.5 F (36.4 C)-98.1 F (36.7 C)] 98.1 F (36.7 C) (09/28 1403) Pulse Rate:  [60-81] 77 (09/28 1403) Resp:  [16] 16 (09/28 1403) BP: (92-114)/(41-87) 92/41 mmHg (09/28 1403) SpO2:  [94 %-100 %] 94 % (09/28 1403) Weight:  [59.512 kg (131 lb 3.2 oz)] 59.512 kg (131 lb 3.2 oz) (09/28 0627) Last BM Date: 05/27/15  Intake/Output from previous day: 09/27 0701 - 09/28 0700 In: 120 [P.O.:120] Out: -  Intake/Output this shift: Total I/O In: 480 [P.O.:480] Out: -   General appearance: alert, cooperative, appears stated age and no distress Resp: clear to auscultation bilaterally Cardio: regular rate and rhythm, S1, S2 normal, no murmur, click, rub or gallop GI: soft, non-tender; bowel sounds normal; no masses,  no organomegaly Extremities: extremities normal, atraumatic, no cyanosis or edema  Lab Results:  Recent Labs  05/26/15 1755 05/27/15 0414 05/28/15 0519  WBC 7.1 5.4 6.5  HGB 9.4* 9.0* 9.2*  HCT 29.4* 28.5* 29.3*  PLT 540* 510* 475*   BMET  Recent Labs  05/26/15 0345 05/27/15 0414 05/28/15 0519  NA 139 137 140  K 4.6 5.0 4.5  CL 111 107 109  CO2 '24 25 23  '$ GLUCOSE 97 107* 110*  BUN 10 5* <5*  CREATININE 1.05* 0.72 0.73  CALCIUM 8.3* 8.5* 8.6*    Recent Labs  05/26/15 2335  CDIFFTOX NEGATIVE    Medications: I have reviewed the patient's current medications.  Assessment/Plan: 1) Change in bowel habits/diarrhea with severe post-prandial abdominal pain-will need to discontinue the Vancomycin. She will need to have a CTA done.  2) Anemia. 3)  CAD/HTN/AODM.  4) Polymyalgia rheumatica.  5) Tobacco abuse.  LOS: 3 days   MANN,JYOTHI 05/28/2015, 3:58 PM

## 2015-05-28 NOTE — Progress Notes (Signed)
[facility] PROGRESS NOTE  Kimberly Downs MGN:[id number] DOB: 30-May-1946 DOA: 05/25/2015 PCP: No primary care provider on file.  Assessment/Plan: 1. Abdominal pain. -Patient presenting with complaints of recurrent abdominal pain associate with nausea and vomiting. -She has a history of recurrent C. difficile colitis requiring 3 prior hospitalizations. -Repeat C.diff Tox PCR was negative. - She remains on oral vancomycin at 250 mg by mouth every 6 hours. -GI following. Ischemic colitis a possibility since she reported having abdominal pain symptoms precipitated by eating..  -On 05/27/2015 she underwent colonoscopy which was unremarkable.  2.  Acute kidney injury. -Her creatinine was elevated at 1.42 on admission likely secondary to dehydration from GI losses. -Renal function improved with administration of IV fluids with her creatinine at 0.73 on a.m. lab work.  3.  Hypertension. -Blood pressures are stable -Plan to continue diltiazem to 40 mg by mouth daily and isosorbide mononitrate 15 mg by mouth daily  4.  History of recurrent C. difficile colitis. -We will continue previous course of oral vancomycin at 250 mg by mouth every 6 hours with probiotics. -Stool for C. difficile toxin during this hospitalization was negative.  5.  Diabetes mellitus. -Blood sugars are controlled, continue sliding scale coverage  Code Status: Full code Family Communication:  Disposition Plan: Anticipate discharge home when medically stable  Consultants: GI  Procedures: Colonoscopy performed on 05/27/2015   HPI/Subjective: Kimberly Downs is a 69 year old female with a past medical history of recurrent C. difficile colitis, type 2 diabetes mellitus, hypertension, coronary artery disease, admitted to the medicine service on 05/25/2015 when she presented with complaints of abdominal pain associated with nausea, vomiting and diarrhea. She is currently visiting the area from [place].GI was consulted as  she was evaluated by Dr.Mann, who recommended further workup with colonoscopy given her complaints of 30 pound weight loss. On 05/27/2015 she underwent colonoscopy, procedure performed by Dr. Benson Norway that was unremarkable.   Objective: Filed Vitals:   05/28/15 0627  BP: 114/87  Pulse: 60  Temp: 98 F (36.7 C)  Resp: 16    Intake/Output Summary (Last 24 hours) at 05/28/15 1135 Last data filed at 05/27/15 1816  Gross per 24 hour  Intake    120 ml  Output      0 ml  Net    120 ml   Filed Weights   05/27/15 0454 05/27/15 1357 05/28/15 0627  Weight: 59.058 kg (130 lb 3.2 oz) 58.968 kg (130 lb) 59.512 kg (131 lb 3.2 oz)    Exam:   General:  [name], awake and alert, sitting up in her bed conversive  Cardiovascular: Regular rate and rhythm normal S1-S2 no murmurs rubs or gallops  Respiratory: Normal respiratory effort lungs are clear  Abdomen: Soft having tenderness over her lower abdominal region, no rebound tenderness or guarding  Musculoskeletal: No edema  Data Reviewed: Basic Metabolic Panel:  Recent Labs Lab 05/25/15 1406 05/26/15 0345 05/27/15 0414 05/28/15 0519  NA 136 139 137 140  K 4.0 4.6 5.0 4.5  CL 100* 111 107 109  CO2 '23 24 25 23  '$ GLUCOSE 134* 97 107* 110*  BUN 16 10 5* <5*  CREATININE 1.42* 1.05* 0.72 0.73  CALCIUM 9.7 8.3* 8.5* 8.6*   Liver Function Tests:  Recent Labs Lab 05/25/15 1406  AST 31  ALT 12*  ALKPHOS 120  BILITOT 0.5  PROT 7.1  ALBUMIN 3.3*    Recent Labs Lab 05/25/15 1406  LIPASE 31   No results for  input(s): AMMONIA in the last 168 hours. CBC:  Recent Labs Lab 05/25/15 1406 05/25/15 2111 05/26/15 0345 05/26/15 1755 05/27/15 0414 05/28/15 0519  WBC 15.6* 8.9 7.2 7.1 5.4 6.5  NEUTROABS 12.3*  --   --   --   --   --   HGB 11.7* 9.7* 9.2* 9.4* 9.0* 9.2*  HCT 35.6* 29.9* 29.5* 29.4* 28.5* 29.3*  MCV 84.8 86.7 88.3 87.2 87.7 87.7  PLT 673* 542* 550* 540* 510* 475*   Cardiac Enzymes: No  results for input(s): CKTOTAL, CKMB, CKMBINDEX, TROPONINI in the last 168 hours. BNP (last 3 results) No results for input(s): BNP in the last 8760 hours.  ProBNP (last 3 results) No results for input(s): PROBNP in the last 8760 hours.  CBG:  Recent Labs Lab 05/27/15 0747 05/27/15 1220 05/27/15 1410 05/27/15 1645 05/28/15 0841  GLUCAP 83 79 83 70 89    Recent Results (from the past 240 hour(s))  Urine culture     Status: None   Collection Time: 05/25/15  2:00 PM  Result Value Ref Range Status   Specimen Description URINE, CLEAN CATCH  Final   Special Requests NONE  Final   Culture   Final    MULTIPLE SPECIES PRESENT, SUGGEST RECOLLECTION Performed at [facility]    Report Status 05/26/2015 FINAL  Final  Stool culture     Status: None (Preliminary result)   Collection Time: 05/25/15  2:21 PM  Result Value Ref Range Status   Specimen Description STOOL  Final   Special Requests NONE  Final   Culture   Final    NO SUSPICIOUS COLONIES, CONTINUING TO HOLD Performed at [facility]    Report Status PENDING  Incomplete  C difficile quick scan w PCR reflex     Status: None   Collection Time: 05/26/15 11:35 PM  Result Value Ref Range Status   C Diff antigen NEGATIVE NEGATIVE Final   C Diff toxin NEGATIVE NEGATIVE Final   C Diff interpretation Negative for toxigenic C. difficile  Final     Studies: No results found.  Scheduled Meds: . diltiazem  240 mg Oral Daily  . feeding supplement (ENSURE ENLIVE)  237 mL Oral BID BM  . insulin aspart  0-9 Units Subcutaneous TID WC  . isosorbide mononitrate  15 mg Oral Daily  . magnesium oxide  400 mg Oral BID  . pentoxifylline  400 mg Oral Daily  . predniSONE  10 mg Oral Q breakfast  . pregabalin  50 mg Oral Daily  . saccharomyces boulardii  250 mg Oral BID  . vancomycin  250 mg Oral Q6H   Continuous Infusions:   Principal Problem:   Clostridium difficile diarrhea Active Problems:   Dehydration   Acute  kidney injury   Anemia   DM (diabetes mellitus), type 2 with renal complications   Essential hypertension   CAD (coronary artery disease)   Tobacco abuse   Recurrent colitis due to Clostridium difficile    Time spent: 30 minutes    Kelvin Cellar  [facility] Pager [phone]. If 7PM-7AM, please contact night-coverage at [email or web address], password [facility] 05/28/2015, 11:35 AM  LOS: 3 days

## 2015-05-29 ENCOUNTER — Inpatient Hospital Stay (HOSPITAL_COMMUNITY): Payer: Medicare Other

## 2015-05-29 ENCOUNTER — Encounter (HOSPITAL_COMMUNITY): Payer: Self-pay | Admitting: Radiology

## 2015-05-29 DIAGNOSIS — K551 Chronic vascular disorders of intestine: Secondary | ICD-10-CM

## 2015-05-29 LAB — CBC
HEMATOCRIT: 27.6 % — AB (ref 36.0–46.0)
Hemoglobin: 8.8 g/dL — ABNORMAL LOW (ref 12.0–15.0)
MCH: 27.2 pg (ref 26.0–34.0)
MCHC: 31.9 g/dL (ref 30.0–36.0)
MCV: 85.4 fL (ref 78.0–100.0)
Platelets: 512 10*3/uL — ABNORMAL HIGH (ref 150–400)
RBC: 3.23 MIL/uL — ABNORMAL LOW (ref 3.87–5.11)
RDW: 15.1 % (ref 11.5–15.5)
WBC: 5.7 10*3/uL (ref 4.0–10.5)

## 2015-05-29 LAB — STOOL CULTURE

## 2015-05-29 LAB — BASIC METABOLIC PANEL
Anion gap: 7 (ref 5–15)
BUN: 8 mg/dL (ref 6–20)
CHLORIDE: 107 mmol/L (ref 101–111)
CO2: 26 mmol/L (ref 22–32)
CREATININE: 0.73 mg/dL (ref 0.44–1.00)
Calcium: 8.7 mg/dL — ABNORMAL LOW (ref 8.9–10.3)
GFR calc Af Amer: >60 mL/min (ref >60)
GFR calc non Af Amer: >60 mL/min (ref >60)
GLUCOSE: 129 mg/dL — AB (ref 65–99)
POTASSIUM: 4.2 mmol/L (ref 3.5–5.1)
Sodium: 140 mmol/L (ref 135–145)

## 2015-05-29 LAB — GLUCOSE, CAPILLARY
GLUCOSE-CAPILLARY: 150 mg/dL — AB (ref 65–99)
Glucose-Capillary: 111 mg/dL — ABNORMAL HIGH (ref 65–99)
Glucose-Capillary: 130 mg/dL — ABNORMAL HIGH (ref 65–99)

## 2015-05-29 MED ORDER — IOHEXOL 350 MG/ML SOLN
100.0000 mL | Freq: Once | INTRAVENOUS | Status: AC | PRN
  Administered 2015-05-29: 100 mL via INTRAVENOUS

## 2015-05-29 NOTE — Consult Note (Signed)
Reason for Consult: Mesenteric ischemia  Requesting Physician: Dr. Collene Mares  HPI: Kimberly Downs is a 69 year old mildly overweight weight recently widowed Caucasian female mother of one son who is here in [place] who we are asked to see to evaluate for mesenteric ischemia. She has a history of coronary stenting in [place] approximately 6 years ago apparently did not have a myocardial infarction. Her cardiac risk factors include continued tobacco abuse. Strong family history, diabetes, hypertension and hyperlipidemia. She gets occasional chest pain. She also complains of claudication. She says several months of postprandial abdominal pain with food avoidance and 30 pound weight loss. She's had multiple colonoscopies was told she's had C. Difficile on several occasions. Recent colonoscopy performed by Dr. Benson Norway did not show any mucosal changes of ischemic colitis. A abdominal CT however did show high-grade disease in the celiac axis, superior and inferior mesenteric artery suggesting an ischemic etiology.   Problem List: Patient Active Problem List   Diagnosis Date Noted  . Clostridium difficile diarrhea 05/25/2015  . Dehydration 05/25/2015  . Acute kidney injury 05/25/2015  . Anemia 05/25/2015  . DM (diabetes mellitus), type 2 with renal complications 13/24/4010  . Essential hypertension 05/25/2015  . CAD (coronary artery disease) 05/25/2015  . Tobacco abuse 05/25/2015  . Recurrent colitis due to Clostridium difficile 05/25/2015    PMHx:  Past Medical History  Diagnosis Date  . C. difficile diarrhea   . Hypertension   . Diabetes mellitus without complication   . Coronary artery disease    Past Surgical History  Procedure Laterality Date  . Abdominal hysterectomy    . Appendectomy    . Cholecystectomy    . Bowel resection    . Coronary angioplasty with stent placement    . Colonoscopy  Early September 2016  . Colonoscopy Left 05/27/2015    Procedure: COLONOSCOPY;   Surgeon: Carol Ada, MD;  Location: [facility];  Service: Endoscopy;  Laterality: Left;    FAMHx: History reviewed. No pertinent family history.  SOCHx:  reports that she has been smoking.  She does not have any smokeless tobacco history on file. She reports that she does not drink alcohol or use illicit drugs.  ALLERGIES: Allergies  Allergen Reactions  . Morphine And Related Nausea And Vomiting    emesis  . Nsaids Nausea And Vomiting    emesis  . Toradol [Ketorolac Tromethamine] Nausea And Vomiting    ROS: Pertinent items are noted in HPI. Pertinent items noted in HPI and remainder of comprehensive ROS otherwise negative.  HOME MEDICATIONS: Prescriptions prior to admission  Medication Sig Dispense Refill Last Dose  . COMBIVENT RESPIMAT 20-100 MCG/ACT AERS respimat Inhale 1 puff into the lungs daily as needed.  0 unknown  . diltiazem (CARDIZEM CD) 240 MG 24 hr capsule Take 240 mg by mouth daily.  0 05/22/2015  . FLORASTOR 250 MG capsule Take 250 mg by mouth daily.  0 05/22/2015  . HYDROcodone-acetaminophen (NORCO) 7.5-325 MG per tablet Take 0.5 tablets by mouth 2 (two) times daily.  0 2 weeks  . isosorbide mononitrate (IMDUR) 30 MG 24 hr tablet Take 15 mg by mouth daily.  0 05/22/2015  . lisinopril (PRINIVIL,ZESTRIL) 40 MG tablet Take 40 mg by mouth daily.  0 05/22/2015  . LYRICA 50 MG capsule Take 1 capsule by mouth daily.  0 05/22/2015  . magnesium oxide (MAG-OX) 400 (241.3 MG) MG tablet Take 1 tablet by mouth 2 (two) times daily.  0 05/22/2015  . ondansetron (  ZOFRAN) 4 MG tablet Take 1 tablet by mouth every 6 (six) hours as needed for nausea or vomiting.   0 05/25/2015 at Unknown time  . pentoxifylline (TRENTAL) 400 MG CR tablet Take 400 mg by mouth daily.   05/22/2015  . potassium chloride (K-DUR) 10 MEQ tablet Take 10 mEq by mouth daily.  0 05/22/2015  . predniSONE (DELTASONE) 5 MG tablet Take 5 mg by mouth daily with breakfast.   05/22/2015  . vancomycin (VANCOCIN) 250 MG  capsule Take 250 mg by mouth 4 (four) times daily.  0 05/25/2015 at Unknown time  . VENTOLIN HFA 108 (90 BASE) MCG/ACT inhaler Inhale 2 puffs into the lungs every 6 (six) hours as needed.  0 Past Month at Unknown time  . furosemide (LASIX) 20 MG tablet Take 20 mg by mouth daily.  0 past week    HOSPITAL MEDICATIONS: I have reviewed the patient's current medications.  VITALS: Blood pressure 116/77, pulse 68, temperature 97.8 F (36.6 C), temperature source Oral, resp. rate 20, height 5' 0.5" (1.537 m), weight 140 lb (63.504 kg), SpO2 99 %.  INPUT/OUTPUT I/O last 3 completed shifts: In: 1300 [P.O.:1300] Out: -  Total I/O In: 240 [P.O.:240] Out: 400 [Urine:400]    PHYSICAL EXAM: General appearance: alert and no distress Neck: no adenopathy, no carotid bruit, no JVD, supple, symmetrical, trachea midline and thyroid not enlarged, symmetric, no tenderness/mass/nodules Lungs: clear to auscultation bilaterally Heart: regular rate and rhythm, S1, S2 normal, no murmur, click, rub or gallop Extremities: 2+ femorals without bruits, 2+ pitting edema bilaterally  LABS:  BMP  Recent Labs  05/27/15 0414 05/28/15 0519 05/29/15 0425  NA 137 140 140  K 5.0 4.5 4.2  CL 107 109 107  CO2 '25 23 26  '$ GLUCOSE 107* 110* 129*  BUN 5* <5* 8  CREATININE 0.72 0.73 0.73  CALCIUM 8.5* 8.6* 8.7*  GFRNONAA >60 >60 >60  GFRAA >60 >60 >60    CBC  Recent Labs Lab 05/29/15 0425  WBC 5.7  RBC 3.23*  HGB 8.8*  HCT 27.6*  PLT 512*  MCV 85.4    HEMOGLOBIN A1C No results found for: HGBA1C, MPG  Cardiac Panel (last 3 results) No results for input(s): CKTOTAL, CKMB, TROPONINI, RELINDX in the last 8760 hours.  BNP (last 3 results) No results for input(s): PROBNP in the last 8760 hours.  TSH No results for input(s): TSH in the last 8760 hours.  CHOLESTEROL No results for input(s): CHOL in the last 8760 hours.  Hepatic Function Panel  Recent Labs  05/25/15 1406  PROT 7.1  ALBUMIN  3.3*  AST 31  ALT 12*  ALKPHOS 120  BILITOT 0.5    IMAGING: Ct Angio Abd/pel W/ And/or W/o  05/29/2015   CLINICAL DATA:  Lower abdominal/pelvic pain with n/vd off/on x4 months and 30lb. Wt. Loss, eval for mesenteric ischemia, enteritis and enterocolitis, Surgery-complete hysterectomy, appy, gb, heart stents, partial colon resection  EXAM: CT ANGIOGRAPHY ABDOMEN AND PELVIS  TECHNIQUE: Multidetector CT imaging of the abdomen and pelvis was performed using the standard protocol during bolus administration of intravenous contrast. Multiplanar reconstructed images including MIPs were obtained and reviewed to evaluate the vascular anatomy.  CONTRAST:  15m OMNIPAQUE IOHEXOL 350 MG/ML SOLN  COMPARISON:  COMPARISON None available  FINDINGS: ARTERIAL FINDINGS:  Aorta: Moderate scattered calcified plaque. No aneurysm, dissection, or stenosis.  Celiac axis: Calcified origin plaque resulting in short segment stenosis of probable hemodynamic significance. Patent distally.  Superior mesenteric: Calcified plaque at the  origin resulting in short segment stenosis of possible hemodynamic significance, patent distally. Hepatic artery arises from the SMA, an anatomic variant.  Left renal: Partially calcified ostial plaque without high-grade stenosis, patent distally  Right renal: Partially calcified ostial plaque resulting in at least mild short segment stenosis. Early bifurcation into anterior and posterior divisions, patent distally.  Inferior mesenteric: Patent, with short-segment origin stenosis related to aortic wall plaque.  Left iliac: Coarse calcifications diffusely without high-grade stenosis. No dissection or aneurysm.  Right iliac: Extensive coarse calcifications predominately in the common and internal iliac arteries. No high-grade stenosis, dissection or aneurysm.  Venous findings: Patent hepatic veins, portal vein, superior mesenteric vein, splenic vein, bilateral renal veins, IVC, and iliac venous system.   Review of the MIP images confirms the above findings.  Nonvascular findings: Coarse scarring in the visualized lung bases. 4.6 cm benign hemangioma in hepatic segment 7, 2 cm hemangioma in segment 4, and two 1 cm technically nonspecific lesions in segment 2 with similar enhancement characteristics , probably also small hemangiomas. Surgical clips in the gallbladder fossa. Dilated CBD seen down to the ampulla. Mild central intrahepatic biliary ductal dilatation. Unremarkable spleen, adrenal glands, pancreas, right kidney. 3 mm calculus in the lower pole left renal collecting system. No hydronephrosis. Stomach, small bowel, and colon are nondilated. Urinary bladder incompletely distended. Bilateral pelvic phleboliths. Previous hysterectomy. No ascites. No free air. No adenopathy. Mild lumbar levoscoliosis with asymmetric facet disease and degenerative disc disease L5-S1.  IMPRESSION: 1. Origin stenoses of celiac axis, SMA, and IMA which may be of adequate severity to account for occlusive mesenteric ischemia. Consider catheter angiography for further evaluation, as the lesions may be amenable to percutaneous treatment if confirmed to be hemodynamically significant. 2. Dilated CBD and mildly dilated central intrahepatic bile ducts post cholecystectomy. Correlate with any clinical or laboratory evidence of biliary obstruction. 3. Multiple benign hepatic hemangiomas. 4. Left nephrolithiasis without hydronephrosis.   Electronically Signed   By: Lucrezia Europe M.D.   On: 05/29/2015 11:06    IMPRESSION: 1. Mesenteric ischemia: Symptoms consistent with this. Abdominal CTA shows high-grade disease in all her mesenteric vessels.   RECOMMENDATION: 1. Abdominal aortogram, mesenteric angiogram with potential percutaneous intervention. I thoroughly discussed the procedure, risks and benefits to the patient and she agrees to proceed. We'll schedule this for Monday.  Time Spent Directly with Patient: 45 minutes  [name] 05/29/2015, 5:57 PM

## 2015-05-29 NOTE — Progress Notes (Signed)
Subjective: Still with severe abdominal pain.  Diarrhea mildly better.  Objective: Vital signs in last 24 hours: Temp:  [97.9 F (36.6 C)] 97.9 F (36.6 C) (09/29 0617) Pulse Rate:  [64-79] 79 (09/29 1340) Resp:  [16] 16 (09/29 1340) BP: (108-137)/(47-72) 137/72 mmHg (09/29 1340) SpO2:  [97 %-100 %] 97 % (09/29 1340) Weight:  [59.149 kg (130 lb 6.4 oz)] 59.149 kg (130 lb 6.4 oz) (09/29 0617) Last BM Date: 05/28/15  Intake/Output from previous day: 09/28 0701 - 09/29 0700 In: 1300 [P.O.:1300] Out: -  Intake/Output this shift:    General appearance: alert and no distress GI: diffuse abdominal pain  Lab Results:  Recent Labs  05/27/15 0414 05/28/15 0519 05/29/15 0425  WBC 5.4 6.5 5.7  HGB 9.0* 9.2* 8.8*  HCT 28.5* 29.3* 27.6*  PLT 510* 475* 512*   BMET  Recent Labs  05/27/15 0414 05/28/15 0519 05/29/15 0425  NA 137 140 140  K 5.0 4.5 4.2  CL 107 109 107  CO2 '25 23 26  '$ GLUCOSE 107* 110* 129*  BUN 5* <5* 8  CREATININE 0.72 0.73 0.73  CALCIUM 8.5* 8.6* 8.7*   LFT No results for input(s): PROT, ALBUMIN, AST, ALT, ALKPHOS, BILITOT, BILIDIR, IBILI in the last 72 hours. PT/INR No results for input(s): LABPROT, INR in the last 72 hours. Hepatitis Panel No results for input(s): HEPBSAG, HCVAB, HEPAIGM, HEPBIGM in the last 72 hours. C-Diff  Recent Labs  05/26/15 2335  CDIFFTOX NEGATIVE   Fecal Lactopherrin No results for input(s): FECLLACTOFRN in the last 72 hours.  Studies/Results: Ct Angio Abd/pel W/ And/or W/o  05/29/2015   CLINICAL DATA:  Lower abdominal/pelvic pain with n/vd off/on x4 months and 30lb. Wt. Loss, eval for mesenteric ischemia, enteritis and enterocolitis, Surgery-complete hysterectomy, appy, gb, heart stents, partial colon resection  EXAM: CT ANGIOGRAPHY ABDOMEN AND PELVIS  TECHNIQUE: Multidetector CT imaging of the abdomen and pelvis was performed using the standard protocol during bolus administration of intravenous contrast. Multiplanar  reconstructed images including MIPs were obtained and reviewed to evaluate the vascular anatomy.  CONTRAST:  111m OMNIPAQUE IOHEXOL 350 MG/ML SOLN  COMPARISON:  COMPARISON None available  FINDINGS: ARTERIAL FINDINGS:  Aorta: Moderate scattered calcified plaque. No aneurysm, dissection, or stenosis.  Celiac axis: Calcified origin plaque resulting in short segment stenosis of probable hemodynamic significance. Patent distally.  Superior mesenteric: Calcified plaque at the origin resulting in short segment stenosis of possible hemodynamic significance, patent distally. Hepatic artery arises from the SMA, an anatomic variant.  Left renal: Partially calcified ostial plaque without high-grade stenosis, patent distally  Right renal: Partially calcified ostial plaque resulting in at least mild short segment stenosis. Early bifurcation into anterior and posterior divisions, patent distally.  Inferior mesenteric: Patent, with short-segment origin stenosis related to aortic wall plaque.  Left iliac: Coarse calcifications diffusely without high-grade stenosis. No dissection or aneurysm.  Right iliac: Extensive coarse calcifications predominately in the common and internal iliac arteries. No high-grade stenosis, dissection or aneurysm.  Venous findings: Patent hepatic veins, portal vein, superior mesenteric vein, splenic vein, bilateral renal veins, IVC, and iliac venous system.  Review of the MIP images confirms the above findings.  Nonvascular findings: Coarse scarring in the visualized lung bases. 4.6 cm benign hemangioma in hepatic segment 7, 2 cm hemangioma in segment 4, and two 1 cm technically nonspecific lesions in segment 2 with similar enhancement characteristics , probably also small hemangiomas. Surgical clips in the gallbladder fossa. Dilated CBD seen down to the ampulla. Mild central  intrahepatic biliary ductal dilatation. Unremarkable spleen, adrenal glands, pancreas, right kidney. 3 mm calculus in the lower pole  left renal collecting system. No hydronephrosis. Stomach, small bowel, and colon are nondilated. Urinary bladder incompletely distended. Bilateral pelvic phleboliths. Previous hysterectomy. No ascites. No free air. No adenopathy. Mild lumbar levoscoliosis with asymmetric facet disease and degenerative disc disease L5-S1.  IMPRESSION: 1. Origin stenoses of celiac axis, SMA, and IMA which may be of adequate severity to account for occlusive mesenteric ischemia. Consider catheter angiography for further evaluation, as the lesions may be amenable to percutaneous treatment if confirmed to be hemodynamically significant. 2. Dilated CBD and mildly dilated central intrahepatic bile ducts post cholecystectomy. Correlate with any clinical or laboratory evidence of biliary obstruction. 3. Multiple benign hepatic hemangiomas. 4. Left nephrolithiasis without hydronephrosis.   Electronically Signed   By: Lucrezia Europe M.D.   On: 05/29/2015 11:06    Medications:  Scheduled: . diltiazem  240 mg Oral Daily  . feeding supplement (ENSURE ENLIVE)  237 mL Oral BID BM  . insulin aspart  0-9 Units Subcutaneous TID WC  . isosorbide mononitrate  15 mg Oral Daily  . magnesium oxide  400 mg Oral BID  . pentoxifylline  400 mg Oral Daily  . predniSONE  10 mg Oral Q breakfast  . pregabalin  50 mg Oral Daily  . saccharomyces boulardii  250 mg Oral BID  . vancomycin  250 mg Oral Q6H   Continuous:   Assessment/Plan: 1) Possible mesenteric ischemia. 2) Diarrhea. 3) ABM pain.   This could explain her symptoms, i.e., the mesenteric ischemia.  She is being transferred to [facility] for Cardiac intervention of the mesenteric vessels.  Plan: 1) Catherization per Cardiology.    LOS: 4 days   HUNG,PATRICK D 05/29/2015, 3:02 PM

## 2015-05-29 NOTE — Plan of Care (Signed)
Problem: Phase I Progression Outcomes Goal: Initial discharge plan identified Outcome: Completed/Met Date Met:  05/29/15 Plan to return home  Problem: Phase II Progression Outcomes Goal: Progress activity as tolerated unless otherwise ordered Outcome: Completed/Met Date Met:  05/29/15 Up walking in halls

## 2015-05-29 NOTE — Progress Notes (Signed)
Report called to receiving nurse at [facility].  Patient stable from AM assessment.  Patient will be transported to [facility] via [facility].

## 2015-05-29 NOTE — Progress Notes (Signed)
NURSING PROGRESS NOTE  [name] [id number] Transfer Data: 05/29/2015 3:59 PM Attending Provider: Kelvin Cellar, MD PCP:No primary care provider on file. Code Status: Full  Kimberly Downs is a 69 y.o. female patient transferred from [facility].  -No acute distress noted.  -No complaints of shortness of breath.  -No complaints of chest pain.   Cardiac Monitoring: Box # 17 in place.   Blood pressure 116/77, pulse 68, temperature 97.8 F (36.6 C), temperature source Oral, resp. rate 20, height 5' 0.5" (1.537 m), weight 63.504 kg (140 lb), SpO2 99 %.   IV Fluids:  IV in place, occlusive dsg intact without redness. Allergies:  Morphine and related; Nsaids; and Toradol  Past Medical History:   has a past medical history of C. difficile diarrhea; Hypertension; Diabetes mellitus without complication; and Coronary artery disease.  Past Surgical History:   has past surgical history that includes Abdominal hysterectomy; Appendectomy; Cholecystectomy; Bowel resection; Coronary angioplasty with stent; Colonoscopy (Early September 2016); and Colonoscopy (Left, 05/27/2015).  Social History:   reports that she has been smoking.  She does not have any smokeless tobacco history on file. She reports that she does not drink alcohol or use illicit drugs.  Skin: Red areas noted to lower extremities.   Patient/Family orientated to room. Information packet given to patient/family. Admission inpatient armband information verified with patient/family to include name and date of birth and placed on patient arm. Side rails up x 2, fall assessment and education completed with patient/family. Patient/family able to verbalize understanding of risk associated with falls and verbalized understanding to call for assistance before getting out of bed. Call light within reach. Patient/family able to voice and demonstrate understanding of unit orientation instructions.    Will continue to evaluate and treat per MD  orders.

## 2015-05-30 LAB — GLUCOSE, CAPILLARY
GLUCOSE-CAPILLARY: 126 mg/dL — AB (ref 65–99)
Glucose-Capillary: 89 mg/dL (ref 65–99)
Glucose-Capillary: 126 mg/dL — ABNORMAL HIGH (ref 65–99)
Glucose-Capillary: 159 mg/dL — ABNORMAL HIGH (ref 65–99)
Glucose-Capillary: 185 mg/dL — ABNORMAL HIGH (ref 65–99)

## 2015-05-30 MED ORDER — FUROSEMIDE 20 MG PO TABS
20.0000 mg | ORAL_TABLET | Freq: Every day | ORAL | Status: DC
  Administered 2015-05-30 – 2015-06-01 (×3): 20 mg via ORAL
  Filled 2015-05-30 (×3): qty 1

## 2015-05-30 MED ORDER — PRO-STAT SUGAR FREE PO LIQD
30.0000 mL | Freq: Two times a day (BID) | ORAL | Status: DC
  Administered 2015-05-30 – 2015-05-31 (×2): 30 mL via ORAL
  Filled 2015-05-30 (×7): qty 30

## 2015-05-30 MED ORDER — PANTOPRAZOLE SODIUM 40 MG PO TBEC
40.0000 mg | DELAYED_RELEASE_TABLET | Freq: Every day | ORAL | Status: DC
  Administered 2015-05-30 – 2015-06-02 (×4): 40 mg via ORAL
  Filled 2015-05-30 (×4): qty 1

## 2015-05-30 MED ORDER — HYDROCODONE-ACETAMINOPHEN 7.5-325 MG PO TABS
1.0000 | ORAL_TABLET | ORAL | Status: DC | PRN
  Administered 2015-05-30 – 2015-06-02 (×15): 1 via ORAL
  Filled 2015-05-30 (×15): qty 1

## 2015-05-30 MED ORDER — ALUM & MAG HYDROXIDE-SIMETH 200-200-20 MG/5ML PO SUSP
30.0000 mL | ORAL | Status: DC | PRN
  Administered 2015-05-30: 30 mL via ORAL
  Filled 2015-05-30: qty 30

## 2015-05-30 MED ORDER — HEPARIN SODIUM (PORCINE) 5000 UNIT/ML IJ SOLN
5000.0000 [IU] | Freq: Three times a day (TID) | INTRAMUSCULAR | Status: DC
  Administered 2015-05-30 – 2015-06-02 (×9): 5000 [IU] via SUBCUTANEOUS
  Filled 2015-05-30 (×9): qty 1

## 2015-05-30 MED ORDER — DIPHENHYDRAMINE-ZINC ACETATE 2-0.1 % EX CREA
TOPICAL_CREAM | Freq: Three times a day (TID) | CUTANEOUS | Status: DC | PRN
  Administered 2015-05-30 – 2015-06-01 (×2): 1 via TOPICAL
  Filled 2015-05-30 (×2): qty 28

## 2015-05-30 NOTE — Progress Notes (Signed)
Nutrition Follow-up  DOCUMENTATION CODES:   Severe malnutrition in context of acute illness/injury  INTERVENTION:   -D/c Ensure Enlive due to poor acceptance -30 ml Prostat BID  NUTRITION DIAGNOSIS:   Malnutrition related to acute illness as evidenced by energy intake < or equal to 50% for > or equal to 5 days, percent weight loss, mild depletion of body fat, mild depletion of muscle mass.  Ongoing  GOAL:   Patient will meet greater than or equal to 90% of their needs  Progressing  MONITOR:   PO intake, Supplement acceptance, Diet advancement, Labs, Weight trends, Skin, I & O's  REASON FOR ASSESSMENT:   Malnutrition Screening Tool    ASSESSMENT:   69 y.o. female , widowed, independent of activities at home and uses wheelchair walker when she steps out of her home, lives in [place], was visiting son in [place] for the last 4 days, PMH of recurrent severe C. difficile (3 since 02/28/2015), DM 2, HTN, CAD status post stent, ongoing tobacco abuse, presented to the [facility] ED on 05/25/15 with complaints of profuse diarrhea, abdominal pain, nausea and vomiting. Her last hospitalization in [place] was approximately 2 weeks ago when she underwent colonoscopy and was told by her gastroenterologist that she has C. difficile colitis.   Pt ambulating hallways at time of visit. She is currently on a regular diet. Meal completion variable; PO: 20-100%, averaging 50% intake. She is refusing Ensure supplements; RD will d/c.   Pt s/p CT of abdomen which revealed stenosis of SMA and IMA.   Pt transferred from [facility], due to possible surgical intervention. Reviewed MD notes, recommending abdominal aortogram,  mesenteric angiogram with potential percutaneous intervention; plan for 06/02/15.   Labs reviewed: CBGS: 89-150.  Diet Order:  Diet regular Room service appropriate?: Yes; Fluid consistency:: Thin  Skin:  Reviewed, no issues  Last BM:   05/30/15  Height:   Ht Readings from Last 1 Encounters:  05/29/15 5' 0.5" (1.537 m)    Weight:   Wt Readings from Last 1 Encounters:  05/30/15 140 lb (63.504 kg)    Ideal Body Weight:  47.7 kg  BMI:  Body mass index is 26.88 kg/(m^2).  Estimated Nutritional Needs:   Kcal:  1450-1650  Protein:  75-85g  Fluid:  1.8L/day  EDUCATION NEEDS:   No education needs identified at this time  Jenifer A. Jimmye Norman, RD, LDN, CDE Pager: [phone] After hours Pager: [phone]

## 2015-05-30 NOTE — Progress Notes (Signed)
[facility] PROGRESS NOTE  Kimberly Downs ELF:[id number] DOB: 12/11/1945 DOA: 05/25/2015 PCP: No primary care provider on file.  Assessment/Plan: 1. Suspected mesenteric ischemia. -Patient presenting with complaints of recurrent abdominal pain associate with nausea and vomiting, bowels oftentimes precipitated by by mouth intake. She has multiple cardiac vascular risk factors including ongoing tobacco abuse and history of coronary artery disease status post percutaneous intervention. -She has a history of recurrent C. difficile colitis requiring 3 prior hospitalizations. -Repeat C.diff Tox PCR was negative, oral vancomycin was discontinued.. -On 05/27/2015 she underwent colonoscopy which was unremarkable. -She was further worked up with a CTA on 05/29/2015 that revealed stenosis at [facility] and IMA -Case was discussed with cardiology for possible catheter angiography, recommended patient be transferred to [facility] to be evaluated by Dr. Gwenlyn Found.   2.  Acute kidney injury. -Her creatinine was elevated at 1.42 on admission likely secondary to dehydration from GI losses. -Renal function improved with administration of IV fluids  3.  Hypertension. -Blood pressures are stable -Plan to continue diltiazem to 40 mg by mouth daily and isosorbide mononitrate 15 mg by mouth daily  4.  History of recurrent C. difficile colitis. -We will continue previous course of oral vancomycin at 250 mg by mouth every 6 hours with probiotics. -Stool for C. difficile toxin during this hospitalization was negative.  5.  Diabetes mellitus. -Blood sugars are controlled, continue sliding scale coverage  Code Status: Full code Family Communication:  Disposition Plan: Anticipate discharge home when medically stable  Consultants: GI  Procedures: Colonoscopy performed on 05/27/2015   HPI/Subjective: Kimberly Downs is a 69 year old female with a past medical history of recurrent C. difficile colitis, type 2  diabetes mellitus, hypertension, coronary artery disease, admitted to the medicine service on 05/25/2015 when she presented with complaints of abdominal pain associated with nausea, vomiting and diarrhea. She is currently visiting the area from [place].GI was consulted as she was evaluated by Dr.Mann, who recommended further workup with colonoscopy given her complaints of 30 pound weight loss. On 05/27/2015 she underwent colonoscopy, procedure performed by Dr. Benson Norway that was unremarkable. She was further worked up with a CTA on 05/29/2015 that revealed stenosis at [facility] and [facility] with radiology reporting that this could be severe enough to account for occlusive mesenteric ischemia. Case was discussed with cardiology who recommended a transfer to [facility] to undergo possible catheter angiography.  Objective: Filed Vitals:   05/30/15 0531  BP: 131/77  Pulse: 62  Temp: 98 F (36.7 C)  Resp: 13    Intake/Output Summary (Last 24 hours) at 05/30/15 5852 Last data filed at 05/30/15 0542  Gross per 24 hour  Intake    360 ml  Output   1801 ml  Net  -1441 ml   Filed Weights   05/29/15 0617 05/29/15 1541 05/30/15 0552  Weight: 59.149 kg (130 lb 6.4 oz) 63.504 kg (140 lb) 63.504 kg (140 lb)    Exam:   General:  [name], awake and alert, sitting up in her bed conversive  Cardiovascular: Regular rate and rhythm normal S1-S2 no murmurs rubs or gallops  Respiratory: Normal respiratory effort lungs are clear  Abdomen: Soft having tenderness over her lower abdominal region, no rebound tenderness or guarding  Musculoskeletal: No edema  Data Reviewed: Basic Metabolic Panel:  Recent Labs Lab 05/25/15 1406 05/26/15 0345 05/27/15 0414 05/28/15 0519 05/29/15 0425  NA 136 139 137 140 140  K 4.0 4.6 5.0 4.5 4.2  CL 100* 111 107 109 107  CO2 '23 24 25 23 26  '$ GLUCOSE 134* 97 107* 110* 129*  BUN 16 10 5* <5* 8  CREATININE 1.42* 1.05* 0.72 0.73 0.73  CALCIUM 9.7  8.3* 8.5* 8.6* 8.7*   Liver Function Tests:  Recent Labs Lab 05/25/15 1406  AST 31  ALT 12*  ALKPHOS 120  BILITOT 0.5  PROT 7.1  ALBUMIN 3.3*    Recent Labs Lab 05/25/15 1406  LIPASE 31   No results for input(s): AMMONIA in the last 168 hours. CBC:  Recent Labs Lab 05/25/15 1406  05/26/15 0345 05/26/15 1755 05/27/15 0414 05/28/15 0519 05/29/15 0425  WBC 15.6*  < > 7.2 7.1 5.4 6.5 5.7  NEUTROABS 12.3*  --   --   --   --   --   --   HGB 11.7*  < > 9.2* 9.4* 9.0* 9.2* 8.8*  HCT 35.6*  < > 29.5* 29.4* 28.5* 29.3* 27.6*  MCV 84.8  < > 88.3 87.2 87.7 87.7 85.4  PLT 673*  < > 550* 540* 510* 475* 512*  < > = values in this interval not displayed. Cardiac Enzymes: No results for input(s): CKTOTAL, CKMB, CKMBINDEX, TROPONINI in the last 168 hours. BNP (last 3 results) No results for input(s): BNP in the last 8760 hours.  ProBNP (last 3 results) No results for input(s): PROBNP in the last 8760 hours.  CBG:  Recent Labs Lab 05/28/15 1654 05/29/15 0748 05/29/15 1156 05/29/15 1645 05/29/15 2337  GLUCAP 131* 111* 130* 150* 89    Recent Results (from the past 240 hour(s))  Urine culture     Status: None   Collection Time: 05/25/15  2:00 PM  Result Value Ref Range Status   Specimen Description URINE, CLEAN CATCH  Final   Special Requests NONE  Final   Culture   Final    MULTIPLE SPECIES PRESENT, SUGGEST RECOLLECTION Performed at [facility]    Report Status 05/26/2015 FINAL  Final  Stool culture     Status: None   Collection Time: 05/25/15  2:21 PM  Result Value Ref Range Status   Specimen Description STOOL  Final   Special Requests NONE  Final   Culture   Final    NO SALMONELLA, SHIGELLA, CAMPYLOBACTER, YERSINIA, OR E.COLI 0157:H7 ISOLATED Performed at [facility]    Report Status 05/29/2015 FINAL  Final  C difficile quick scan w PCR reflex     Status: None   Collection Time: 05/26/15 11:35 PM  Result Value Ref Range Status   C Diff  antigen NEGATIVE NEGATIVE Final   C Diff toxin NEGATIVE NEGATIVE Final   C Diff interpretation Negative for toxigenic C. difficile  Final     Studies: Ct Angio Abd/pel W/ And/or W/o  05/29/2015   CLINICAL DATA:  Lower abdominal/pelvic pain with n/vd off/on x4 months and 30lb. Wt. Loss, eval for mesenteric ischemia, enteritis and enterocolitis, Surgery-complete hysterectomy, appy, gb, heart stents, partial colon resection  EXAM: CT ANGIOGRAPHY ABDOMEN AND PELVIS  TECHNIQUE: Multidetector CT imaging of the abdomen and pelvis was performed using the standard protocol during bolus administration of intravenous contrast. Multiplanar reconstructed images including MIPs were obtained and reviewed to evaluate the vascular anatomy.  CONTRAST:  1103m OMNIPAQUE IOHEXOL 350 MG/ML SOLN  COMPARISON:  COMPARISON None available  FINDINGS: ARTERIAL FINDINGS:  Aorta: Moderate scattered calcified plaque. No aneurysm, dissection, or stenosis.  Celiac axis: Calcified origin plaque resulting in short segment stenosis of probable hemodynamic  significance. Patent distally.  Superior mesenteric: Calcified plaque at the origin resulting in short segment stenosis of possible hemodynamic significance, patent distally. Hepatic artery arises from the SMA, an anatomic variant.  Left renal: Partially calcified ostial plaque without high-grade stenosis, patent distally  Right renal: Partially calcified ostial plaque resulting in at least mild short segment stenosis. Early bifurcation into anterior and posterior divisions, patent distally.  Inferior mesenteric: Patent, with short-segment origin stenosis related to aortic wall plaque.  Left iliac: Coarse calcifications diffusely without high-grade stenosis. No dissection or aneurysm.  Right iliac: Extensive coarse calcifications predominately in the common and internal iliac arteries. No high-grade stenosis, dissection or aneurysm.  Venous findings: Patent hepatic veins, portal vein, superior  mesenteric vein, splenic vein, bilateral renal veins, IVC, and iliac venous system.  Review of the MIP images confirms the above findings.  Nonvascular findings: Coarse scarring in the visualized lung bases. 4.6 cm benign hemangioma in hepatic segment 7, 2 cm hemangioma in segment 4, and two 1 cm technically nonspecific lesions in segment 2 with similar enhancement characteristics , probably also small hemangiomas. Surgical clips in the gallbladder fossa. Dilated CBD seen down to the ampulla. Mild central intrahepatic biliary ductal dilatation. Unremarkable spleen, adrenal glands, pancreas, right kidney. 3 mm calculus in the lower pole left renal collecting system. No hydronephrosis. Stomach, small bowel, and colon are nondilated. Urinary bladder incompletely distended. Bilateral pelvic phleboliths. Previous hysterectomy. No ascites. No free air. No adenopathy. Mild lumbar levoscoliosis with asymmetric facet disease and degenerative disc disease L5-S1.  IMPRESSION: 1. Origin stenoses of celiac axis, SMA, and IMA which may be of adequate severity to account for occlusive mesenteric ischemia. Consider catheter angiography for further evaluation, as the lesions may be amenable to percutaneous treatment if confirmed to be hemodynamically significant. 2. Dilated CBD and mildly dilated central intrahepatic bile ducts post cholecystectomy. Correlate with any clinical or laboratory evidence of biliary obstruction. 3. Multiple benign hepatic hemangiomas. 4. Left nephrolithiasis without hydronephrosis.   Electronically Signed   By: Lucrezia Europe M.D.   On: 05/29/2015 11:06    Scheduled Meds: . diltiazem  240 mg Oral Daily  . feeding supplement (ENSURE ENLIVE)  237 mL Oral BID BM  . insulin aspart  0-9 Units Subcutaneous TID WC  . isosorbide mononitrate  15 mg Oral Daily  . magnesium oxide  400 mg Oral BID  . pentoxifylline  400 mg Oral Daily  . predniSONE  10 mg Oral Q breakfast  . pregabalin  50 mg Oral Daily  .  saccharomyces boulardii  250 mg Oral BID  . vancomycin  250 mg Oral Q6H   Continuous Infusions:   Principal Problem:   Clostridium difficile diarrhea Active Problems:   Dehydration   Acute kidney injury   Anemia   DM (diabetes mellitus), type 2 with renal complications   Essential hypertension   CAD (coronary artery disease)   Tobacco abuse   Recurrent colitis due to Clostridium difficile    Time spent: 35 minutes    Kelvin Cellar  [facility] Pager [phone]. If 7PM-7AM, please contact night-coverage at [email or web address], password [facility] 05/30/2015, 6:32 AM  LOS: 5 days

## 2015-05-30 NOTE — Progress Notes (Signed)
[facility] PROGRESS NOTE  Kimberly Downs IZT:[id number] DOB: [birth year] DOA: 05/25/2015 PCP: No primary care provider on file.  Assessment/Plan: 1. Suspected mesenteric ischemia. -Patient presenting with complaints of recurrent abdominal pain associate with nausea and vomiting, bowels oftentimes precipitated by by mouth intake. She has multiple cardiac vascular risk factors including ongoing tobacco abuse and history of coronary artery disease status post percutaneous intervention. -She has a history of recurrent C. difficile colitis requiring 3 prior hospitalizations. -Repeat C.diff Tox PCR was negative, oral vancomycin was discontinued.. -On 05/27/2015 she underwent colonoscopy which was unremarkable, pathology as well was unremarkable. -She was further worked up with a CTA on 05/29/2015 that revealed stenosis at [facility] and IMA -Transferred to [facility] cone 9/29, seen by Dr. Gwenlyn Found, plan is for Abdominal aortogram, mesenteric angiogram with potential percutaneous intervention  2.  Acute kidney injury. -Her creatinine was elevated at 1.42 on admission likely secondary to dehydration from GI losses. -Renal function improved with administration of IV fluids - Patient started to develop lower extremity edema, will resume back on low dose Lasix and monitor her renal function closely.  3.  Hypertension. -Blood pressures are stable -Continue with Imdur and Cardizem  4.  History of recurrent C. difficile colitis. -Stool for C. difficile toxin during this hospitalization was negative. - We'll discontinue oral vancomycin  5.  Diabetes mellitus. -Blood sugars are controlled, continue sliding scale coverage  Code Status: Full code Family Communication: None at bedside Disposition Plan: During procedure by Dr. Gwenlyn Found this Monday  Consultants: GI Cardiology Procedures: Colonoscopy performed on 05/27/2015   HPI/Subjective: Kimberly Downs is a 69 year old female with a past medical history of  recurrent C. difficile colitis, type 2 diabetes mellitus, hypertension, coronary artery disease, admitted to the medicine service on 05/25/2015 when she presented with complaints of abdominal pain associated with nausea, vomiting and diarrhea. She is currently visiting the area from [place].GI was consulted as she was evaluated by Dr.Mann, who recommended further workup with colonoscopy given her complaints of 30 pound weight loss. On 05/27/2015 she underwent colonoscopy, procedure performed by Dr. Benson Norway that was unremarkable. She was further worked up with a CTA on 05/29/2015 that revealed stenosis at [facility] and IMA with radiology reporting that this could be severe enough to account for occlusive mesenteric ischemia. Case was discussed with cardiology who recommended a transfer to [facility] to undergo possible catheter angiography, seen by Dr. Gwenlyn Found, plan for aortogram/GEN trig angiogram with possible intervention this coming Monday.  Objective: Filed Vitals:   05/30/15 0531  BP: 131/77  Pulse: 62  Temp: 98 F (36.7 C)  Resp: 13    Intake/Output Summary (Last 24 hours) at 05/30/15 1126 Last data filed at 05/30/15 0542  Gross per 24 hour  Intake    360 ml  Output   1801 ml  Net  -1441 ml   Filed Weights   05/29/15 0617 05/29/15 1541 05/30/15 0552  Weight: 59.149 kg (130 lb 6.4 oz) 63.504 kg (140 lb) 63.504 kg (140 lb)    Exam:   General:  [name], awake and alert, sitting up in her bed conversive  Cardiovascular: Regular rate and rhythm normal S1-S2 no murmurs rubs or gallops  Respiratory: Normal respiratory effort lungs are clear  Abdomen: Soft having tenderness over her lower abdominal region, no rebound tenderness or guarding  Musculoskeletal: +1 edema  Data Reviewed: Basic Metabolic Panel:  Recent Labs Lab 05/25/15 1406 05/26/15 0345 05/27/15 0414 05/28/15 0519 05/29/15 0425  NA 136 139 137 140 140  K 4.0 4.6 5.0 4.5 4.2  CL 100*  111 107 109 107  CO2 '23 24 25 23 26  '$ GLUCOSE 134* 97 107* 110* 129*  BUN 16 10 5* <5* 8  CREATININE 1.42* 1.05* 0.72 0.73 0.73  CALCIUM 9.7 8.3* 8.5* 8.6* 8.7*   Liver Function Tests:  Recent Labs Lab 05/25/15 1406  AST 31  ALT 12*  ALKPHOS 120  BILITOT 0.5  PROT 7.1  ALBUMIN 3.3*    Recent Labs Lab 05/25/15 1406  LIPASE 31   No results for input(s): AMMONIA in the last 168 hours. CBC:  Recent Labs Lab 05/25/15 1406  05/26/15 0345 05/26/15 1755 05/27/15 0414 05/28/15 0519 05/29/15 0425  WBC 15.6*  < > 7.2 7.1 5.4 6.5 5.7  NEUTROABS 12.3*  --   --   --   --   --   --   HGB 11.7*  < > 9.2* 9.4* 9.0* 9.2* 8.8*  HCT 35.6*  < > 29.5* 29.4* 28.5* 29.3* 27.6*  MCV 84.8  < > 88.3 87.2 87.7 87.7 85.4  PLT 673*  < > 550* 540* 510* 475* 512*  < > = values in this interval not displayed. Cardiac Enzymes: No results for input(s): CKTOTAL, CKMB, CKMBINDEX, TROPONINI in the last 168 hours. BNP (last 3 results) No results for input(s): BNP in the last 8760 hours.  ProBNP (last 3 results) No results for input(s): PROBNP in the last 8760 hours.  CBG:  Recent Labs Lab 05/29/15 0748 05/29/15 1156 05/29/15 1645 05/29/15 2337 05/30/15 0815  GLUCAP 111* 130* 150* 89 126*    Recent Results (from the past 240 hour(s))  Urine culture     Status: None   Collection Time: 05/25/15  2:00 PM  Result Value Ref Range Status   Specimen Description URINE, CLEAN CATCH  Final   Special Requests NONE  Final   Culture   Final    MULTIPLE SPECIES PRESENT, SUGGEST RECOLLECTION Performed at [facility]    Report Status 05/26/2015 FINAL  Final  Stool culture     Status: None   Collection Time: 05/25/15  2:21 PM  Result Value Ref Range Status   Specimen Description STOOL  Final   Special Requests NONE  Final   Culture   Final    NO SALMONELLA, SHIGELLA, CAMPYLOBACTER, YERSINIA, OR E.COLI 0157:H7 ISOLATED Performed at [facility]    Report Status 05/29/2015  FINAL  Final  C difficile quick scan w PCR reflex     Status: None   Collection Time: 05/26/15 11:35 PM  Result Value Ref Range Status   C Diff antigen NEGATIVE NEGATIVE Final   C Diff toxin NEGATIVE NEGATIVE Final   C Diff interpretation Negative for toxigenic C. difficile  Final     Studies: Ct Angio Abd/pel W/ And/or W/o  05/29/2015   CLINICAL DATA:  Lower abdominal/pelvic pain with n/vd off/on x4 months and 30lb. Wt. Loss, eval for mesenteric ischemia, enteritis and enterocolitis, Surgery-complete hysterectomy, appy, gb, heart stents, partial colon resection  EXAM: CT ANGIOGRAPHY ABDOMEN AND PELVIS  TECHNIQUE: Multidetector CT imaging of the abdomen and pelvis was performed using the standard protocol during bolus administration of intravenous contrast. Multiplanar reconstructed images including MIPs were obtained and reviewed to evaluate the vascular anatomy.  CONTRAST:  120m OMNIPAQUE IOHEXOL 350 MG/ML SOLN  COMPARISON:  COMPARISON None available  FINDINGS: ARTERIAL FINDINGS:  Aorta: Moderate scattered calcified plaque. No aneurysm, dissection, or stenosis.  Celiac axis: Calcified origin plaque resulting in short segment stenosis of probable hemodynamic significance. Patent distally.  Superior mesenteric: Calcified plaque at the origin resulting in short segment stenosis of possible hemodynamic significance, patent distally. Hepatic artery arises from the SMA, an anatomic variant.  Left renal: Partially calcified ostial plaque without high-grade stenosis, patent distally  Right renal: Partially calcified ostial plaque resulting in at least mild short segment stenosis. Early bifurcation into anterior and posterior divisions, patent distally.  Inferior mesenteric: Patent, with short-segment origin stenosis related to aortic wall plaque.  Left iliac: Coarse calcifications diffusely without high-grade stenosis. No dissection or aneurysm.  Right iliac: Extensive coarse calcifications predominately in  the common and internal iliac arteries. No high-grade stenosis, dissection or aneurysm.  Venous findings: Patent hepatic veins, portal vein, superior mesenteric vein, splenic vein, bilateral renal veins, IVC, and iliac venous system.  Review of the MIP images confirms the above findings.  Nonvascular findings: Coarse scarring in the visualized lung bases. 4.6 cm benign hemangioma in hepatic segment 7, 2 cm hemangioma in segment 4, and two 1 cm technically nonspecific lesions in segment 2 with similar enhancement characteristics , probably also small hemangiomas. Surgical clips in the gallbladder fossa. Dilated CBD seen down to the ampulla. Mild central intrahepatic biliary ductal dilatation. Unremarkable spleen, adrenal glands, pancreas, right kidney. 3 mm calculus in the lower pole left renal collecting system. No hydronephrosis. Stomach, small bowel, and colon are nondilated. Urinary bladder incompletely distended. Bilateral pelvic phleboliths. Previous hysterectomy. No ascites. No free air. No adenopathy. Mild lumbar levoscoliosis with asymmetric facet disease and degenerative disc disease L5-S1.  IMPRESSION: 1. Origin stenoses of celiac axis, SMA, and IMA which may be of adequate severity to account for occlusive mesenteric ischemia. Consider catheter angiography for further evaluation, as the lesions may be amenable to percutaneous treatment if confirmed to be hemodynamically significant. 2. Dilated CBD and mildly dilated central intrahepatic bile ducts post cholecystectomy. Correlate with any clinical or laboratory evidence of biliary obstruction. 3. Multiple benign hepatic hemangiomas. 4. Left nephrolithiasis without hydronephrosis.   Electronically Signed   By: Lucrezia Europe M.D.   On: 05/29/2015 11:06    Scheduled Meds: . diltiazem  240 mg Oral Daily  . feeding supplement (ENSURE ENLIVE)  237 mL Oral BID BM  . heparin subcutaneous  5,000 Units Subcutaneous 3 times per day  . insulin aspart  0-9 Units  Subcutaneous TID WC  . isosorbide mononitrate  15 mg Oral Daily  . magnesium oxide  400 mg Oral BID  . pentoxifylline  400 mg Oral Daily  . predniSONE  10 mg Oral Q breakfast  . pregabalin  50 mg Oral Daily  . saccharomyces boulardii  250 mg Oral BID  . vancomycin  250 mg Oral Q6H   Continuous Infusions:   Principal Problem:   Clostridium difficile diarrhea Active Problems:   Dehydration   Acute kidney injury   Anemia   DM (diabetes mellitus), type 2 with renal complications   Essential hypertension   CAD (coronary artery disease)   Tobacco abuse   Recurrent colitis due to Clostridium difficile    Time spent: 35 minutes    ELGERGAWY, DAWOOD  [facility] Pager [phone] . If 7PM-7AM, please contact night-coverage at [email or web address], password [facility] 05/30/2015, 11:26 AM  LOS: 5 days

## 2015-05-30 NOTE — Care Management Important Message (Signed)
Important Message  Patient Details  Name: Kimberly Downs MRN: [id number] Date of Birth: Dec 15, 1945   Medicare Important Message Given:  Yes-third notification given    Nathen May 05/30/2015, 12:44 PM

## 2015-05-30 NOTE — Care Management Note (Signed)
Date:05-30-15 Friday Spoke with patient at the bedside. Introduced self as Tourist information centre manager and explained role in discharge planning and how to be reached. Verified patient lives in [place] alone. Verified patient anticipates to go home with son in [place] at time of discharge and will have part-time supervision by family at this time to best of their knowledge. Patient has DME cane rolator (has with her at son's house). Expressed potential need for no other DME. Patient denied needing help with their medication. Patient drives to MD appointments. Verified patient has PCP Dr. Gaylan Gerold, [facility] in [place]. Patient states they currently receive [facility] services through no one.  Daughter in law that patient is stayig with is a Therapist, sports and patient states that she does not expect needing any extra help at home.   Plan: CM will continue to follow for discharge planning and [facility] resources.   Carles Collet RN BSN CM [phone] Management Note  Patient Details  Name: Kimberly Downs MRN: [id number] Date of Birth: March 10, 1946  Subjective/Objective:                    Action/Plan:   Expected Discharge Date:                  Expected Discharge Plan:  Home/Self Care  In-House Referral:     Discharge planning Services  CM Consult  Post Acute Care Choice:    Choice offered to:     DME Arranged:    DME Agency:     HH Arranged:    HH Agency:     Status of Service:  In process, will continue to follow  Medicare Important Message Given:  Yes-second notification given Date Medicare IM Given:    Medicare IM give by:    Date Additional Medicare IM Given:    Additional Medicare Important Message give by:     If discussed at [place] of Stay Meetings, dates discussed:    Additional Comments:  Carles Collet, RN 05/30/2015, 11:14 AM

## 2015-05-31 DIAGNOSIS — A047 Enterocolitis due to Clostridium difficile: Principal | ICD-10-CM

## 2015-05-31 LAB — CBC
HEMATOCRIT: 30.7 % — AB (ref 36.0–46.0)
Hemoglobin: 9.7 g/dL — ABNORMAL LOW (ref 12.0–15.0)
MCH: 27.6 pg (ref 26.0–34.0)
MCHC: 31.6 g/dL (ref 30.0–36.0)
MCV: 87.2 fL (ref 78.0–100.0)
Platelets: 594 10*3/uL — ABNORMAL HIGH (ref 150–400)
RBC: 3.52 MIL/uL — ABNORMAL LOW (ref 3.87–5.11)
RDW: 15.4 % (ref 11.5–15.5)
WBC: 8.9 10*3/uL (ref 4.0–10.5)

## 2015-05-31 LAB — BASIC METABOLIC PANEL
ANION GAP: 7 (ref 5–15)
CALCIUM: 9 mg/dL (ref 8.9–10.3)
CO2: 31 mmol/L (ref 22–32)
CREATININE: 0.93 mg/dL (ref 0.44–1.00)
Chloride: 100 mmol/L — ABNORMAL LOW (ref 101–111)
GFR calc Af Amer: >60 mL/min (ref >60)
GFR calc non Af Amer: >60 mL/min (ref >60)
GLUCOSE: 121 mg/dL — AB (ref 65–99)
Potassium: 3.3 mmol/L — ABNORMAL LOW (ref 3.5–5.1)
Sodium: 138 mmol/L (ref 135–145)

## 2015-05-31 LAB — GLUCOSE, CAPILLARY
GLUCOSE-CAPILLARY: 182 mg/dL — AB (ref 65–99)
GLUCOSE-CAPILLARY: 162 mg/dL — AB (ref 65–99)
Glucose-Capillary: 114 mg/dL — ABNORMAL HIGH (ref 65–99)

## 2015-05-31 MED ORDER — POTASSIUM CHLORIDE CRYS ER 20 MEQ PO TBCR
40.0000 meq | EXTENDED_RELEASE_TABLET | Freq: Two times a day (BID) | ORAL | Status: AC
  Administered 2015-05-31 (×2): 40 meq via ORAL
  Filled 2015-05-31: qty 4
  Filled 2015-05-31 (×2): qty 2

## 2015-05-31 NOTE — Progress Notes (Signed)
[facility] PROGRESS NOTE  Kimberly Downs XIP:[id number] DOB: 05-10-1946 DOA: 05/25/2015 PCP: No primary care provider on file.  Assessment/Plan: Suspected mesenteric ischemia. -Patient presenting with complaints of recurrent abdominal pain associate with nausea and vomiting, bowels oftentimes precipitated by by mouth intake. She has multiple cardiac vascular risk factors including ongoing tobacco abuse and history of coronary artery disease status post percutaneous intervention. -She has a history of recurrent C. difficile colitis requiring 3 prior hospitalizations. -Repeat C.diff Tox PCR was negative, oral vancomycin was discontinued.. -On 05/27/2015 she underwent colonoscopy which was unremarkable, pathology as well was unremarkable. -She was further worked up with a CTA on 05/29/2015 that revealed stenosis at [facility] and [facility] -Transferred to [facility] cone 9/29, seen by Dr. Gwenlyn Found, plan is for Abdominal aortogram, mesenteric angiogram with potential percutaneous intervention    Acute kidney injury. -Her creatinine was elevated at 1.42 on admission likely secondary to dehydration from GI losses. -Renal function improved with administration of IV fluids - Patient started to develop lower extremity edema, resumed back on low dose Lasix and monitor her renal function closely.    Hypertension. -Blood pressures are stable -Continue with Imdur and Cardizem    History of recurrent C. difficile colitis. -Stool for C. difficile toxin during this hospitalization was negative. - We'll discontinue oral vancomycin   Diabetes mellitus. -Blood sugars are controlled, continue sliding scale coverage  Hypokalemia - Repleted, monitor closely  Code Status: Downs code Family Communication: None at bedside Disposition Plan: pending procedure by Dr. Gwenlyn Found this Monday  Consultants: GI Cardiology Procedures: Colonoscopy performed on 05/27/2015   HPI/Subjective: Kimberly Downs is a 69 year old female with  a past medical history of recurrent C. difficile colitis, type 2 diabetes mellitus, hypertension, coronary artery disease, admitted to the medicine service on 05/25/2015 when she presented with complaints of abdominal pain associated with nausea, vomiting and diarrhea. She is currently visiting the area from [place].GI was consulted as she was evaluated by Dr.Mann, who recommended further workup with colonoscopy given her complaints of 30 pound weight loss. On 05/27/2015 she underwent colonoscopy, procedure performed by Dr. Benson Norway that was unremarkable. She was further worked up with a CTA on 05/29/2015 that revealed stenosis at [facility] and [facility] with radiology reporting that this could be severe enough to account for occlusive mesenteric ischemia. Case was discussed with cardiology who recommended a transfer to [facility] to undergo possible catheter angiography, seen by Dr. Gwenlyn Found, plan for aortogram/GEN trig angiogram with possible intervention this coming Monday.  Objective: Filed Vitals:   05/31/15 0937  BP: 146/62  Pulse:   Temp:   Resp:     Intake/Output Summary (Last 24 hours) at 05/31/15 1313 Last data filed at 05/30/15 1611  Gross per 24 hour  Intake      0 ml  Output    800 ml  Net   -800 ml   Filed Weights   05/29/15 1541 05/30/15 0552 05/31/15 0700  Weight: 63.504 kg (140 lb) 63.504 kg (140 lb) 64.2 kg (141 lb 8.6 oz)    Exam:   General:  [name], awake and alert, sitting up in her bed conversive  Cardiovascular: Regular rate and rhythm normal S1-S2 no murmurs rubs or gallops  Respiratory: Normal respiratory effort lungs are clear  Abdomen: Soft having tenderness over her lower abdominal region, no rebound tenderness or guarding  Musculoskeletal: +1 edema  Data Reviewed: Basic Metabolic Panel:  Recent Labs Lab 05/26/15 0345 05/27/15 0414 05/28/15 0519 05/29/15  0425 05/31/15 0710  NA 139 137 140 140 138  K 4.6 5.0 4.5 4.2 3.3*  CL  111 107 109 107 100*  CO2 '24 25 23 26 31  '$ GLUCOSE 97 107* 110* 129* 121*  BUN 10 5* <5* 8 <5*  CREATININE 1.05* 0.72 0.73 0.73 0.93  CALCIUM 8.3* 8.5* 8.6* 8.7* 9.0   Liver Function Tests:  Recent Labs Lab 05/25/15 1406  AST 31  ALT 12*  ALKPHOS 120  BILITOT 0.5  PROT 7.1  ALBUMIN 3.3*    Recent Labs Lab 05/25/15 1406  LIPASE 31   No results for input(s): AMMONIA in the last 168 hours. CBC:  Recent Labs Lab 05/25/15 1406  05/26/15 1755 05/27/15 0414 05/28/15 0519 05/29/15 0425 05/31/15 0710  WBC 15.6*  < > 7.1 5.4 6.5 5.7 8.9  NEUTROABS 12.3*  --   --   --   --   --   --   HGB 11.7*  < > 9.4* 9.0* 9.2* 8.8* 9.7*  HCT 35.6*  < > 29.4* 28.5* 29.3* 27.6* 30.7*  MCV 84.8  < > 87.2 87.7 87.7 85.4 87.2  PLT 673*  < > 540* 510* 475* 512* 594*  < > = values in this interval not displayed. Cardiac Enzymes: No results for input(s): CKTOTAL, CKMB, CKMBINDEX, TROPONINI in the last 168 hours. BNP (last 3 results) No results for input(s): BNP in the last 8760 hours.  ProBNP (last 3 results) No results for input(s): PROBNP in the last 8760 hours.  CBG:  Recent Labs Lab 05/30/15 1148 05/30/15 1640 05/30/15 2226 05/31/15 0834 05/31/15 1146  GLUCAP 159* 185* 126* 114* 182*    Recent Results (from the past 240 hour(s))  Urine culture     Status: None   Collection Time: 05/25/15  2:00 PM  Result Value Ref Range Status   Specimen Description URINE, CLEAN CATCH  Final   Special Requests NONE  Final   Culture   Final    MULTIPLE SPECIES PRESENT, SUGGEST RECOLLECTION Performed at [facility]    Report Status 05/26/2015 FINAL  Final  Stool culture     Status: None   Collection Time: 05/25/15  2:21 PM  Result Value Ref Range Status   Specimen Description STOOL  Final   Special Requests NONE  Final   Culture   Final    NO SALMONELLA, SHIGELLA, CAMPYLOBACTER, YERSINIA, OR E.COLI 0157:H7 ISOLATED Performed at [facility]    Report Status  05/29/2015 FINAL  Final  C difficile quick scan w PCR reflex     Status: None   Collection Time: 05/26/15 11:35 PM  Result Value Ref Range Status   C Diff antigen NEGATIVE NEGATIVE Final   C Diff toxin NEGATIVE NEGATIVE Final   C Diff interpretation Negative for toxigenic C. difficile  Final     Studies: No results found.  Scheduled Meds: . diltiazem  240 mg Oral Daily  . feeding supplement (PRO-STAT SUGAR FREE 64)  30 mL Oral BID  . furosemide  20 mg Oral Daily  . heparin subcutaneous  5,000 Units Subcutaneous 3 times per day  . insulin aspart  0-9 Units Subcutaneous TID WC  . isosorbide mononitrate  15 mg Oral Daily  . magnesium oxide  400 mg Oral BID  . pantoprazole  40 mg Oral Daily  . pentoxifylline  400 mg Oral Daily  . potassium chloride  40 mEq Oral BID  . predniSONE  10 mg Oral Q breakfast  .  pregabalin  50 mg Oral Daily  . saccharomyces boulardii  250 mg Oral BID   Continuous Infusions:   Principal Problem:   Clostridium difficile diarrhea Active Problems:   Dehydration   Acute kidney injury   Anemia   DM (diabetes mellitus), type 2 with renal complications   Essential hypertension   CAD (coronary artery disease)   Tobacco abuse   Recurrent colitis due to Clostridium difficile    Time spent: 35 minutes    Kathyleen Radice  [facility] Pager [phone] . If 7PM-7AM, please contact night-coverage at [email or web address], password [facility] 05/31/2015, 1:13 PM  LOS: 6 days

## 2015-05-31 NOTE — Progress Notes (Signed)
Plans for angiogram noted  Cardiology to see as needed over the weekend. Please call with questions.  Thompson Grayer MD, [facility] 05/31/2015 9:40 AM

## 2015-06-01 ENCOUNTER — Inpatient Hospital Stay (HOSPITAL_COMMUNITY): Payer: Medicare Other

## 2015-06-01 DIAGNOSIS — R609 Edema, unspecified: Secondary | ICD-10-CM

## 2015-06-01 LAB — CBC
HEMATOCRIT: 29.1 % — AB (ref 36.0–46.0)
Hemoglobin: 9 g/dL — ABNORMAL LOW (ref 12.0–15.0)
MCH: 26.9 pg (ref 26.0–34.0)
MCHC: 30.9 g/dL (ref 30.0–36.0)
MCV: 86.9 fL (ref 78.0–100.0)
Platelets: 517 10*3/uL — ABNORMAL HIGH (ref 150–400)
RBC: 3.35 MIL/uL — ABNORMAL LOW (ref 3.87–5.11)
RDW: 15.3 % (ref 11.5–15.5)
WBC: 7.6 10*3/uL (ref 4.0–10.5)

## 2015-06-01 LAB — GLUCOSE, CAPILLARY
GLUCOSE-CAPILLARY: 130 mg/dL — AB (ref 65–99)
GLUCOSE-CAPILLARY: 91 mg/dL (ref 65–99)
GLUCOSE-CAPILLARY: 118 mg/dL — AB (ref 65–99)
Glucose-Capillary: 132 mg/dL — ABNORMAL HIGH (ref 65–99)
Glucose-Capillary: 130 mg/dL — ABNORMAL HIGH (ref 65–99)

## 2015-06-01 LAB — BASIC METABOLIC PANEL
ANION GAP: 9 (ref 5–15)
BUN: 8 mg/dL (ref 6–20)
CALCIUM: 9.2 mg/dL (ref 8.9–10.3)
CO2: 29 mmol/L (ref 22–32)
Chloride: 101 mmol/L (ref 101–111)
Creatinine, Ser: 1.01 mg/dL — ABNORMAL HIGH (ref 0.44–1.00)
GFR calc Af Amer: >60 mL/min (ref >60)
GFR calc non Af Amer: 55 mL/min — ABNORMAL LOW (ref >60)
GLUCOSE: 123 mg/dL — AB (ref 65–99)
Potassium: 4.8 mmol/L (ref 3.5–5.1)
Sodium: 139 mmol/L (ref 135–145)

## 2015-06-01 LAB — PROTIME-INR
INR: 0.99 (ref 0.00–1.49)
Prothrombin Time: 13.3 seconds (ref 11.6–15.2)

## 2015-06-01 MED ORDER — ASPIRIN 81 MG PO CHEW
81.0000 mg | CHEWABLE_TABLET | ORAL | Status: AC
  Administered 2015-06-02: 81 mg via ORAL
  Filled 2015-06-01: qty 1

## 2015-06-01 MED ORDER — SODIUM CHLORIDE 0.9 % IV SOLN: 250.0000 mL | INTRAVENOUS | Status: DC | PRN

## 2015-06-01 MED ORDER — SODIUM CHLORIDE 0.9 % WEIGHT BASED INFUSION
1.0000 mL/kg/h | INTRAVENOUS | Status: DC
  Administered 2015-06-02: 1 mL/kg/h via INTRAVENOUS

## 2015-06-01 MED ORDER — ALPRAZOLAM 0.25 MG PO TABS
0.2500 mg | ORAL_TABLET | Freq: Two times a day (BID) | ORAL | Status: DC | PRN
  Administered 2015-06-01 – 2015-06-02 (×3): 0.25 mg via ORAL
  Filled 2015-06-01 (×3): qty 1

## 2015-06-01 MED ORDER — SODIUM CHLORIDE 0.9 % WEIGHT BASED INFUSION
3.0000 mL/kg/h | INTRAVENOUS | Status: DC
  Administered 2015-06-02: 3 mL/kg/h via INTRAVENOUS

## 2015-06-01 MED ORDER — SODIUM CHLORIDE 0.9 % IJ SOLN: 3.0000 mL | INTRAMUSCULAR | Status: DC | PRN

## 2015-06-01 MED ORDER — SODIUM CHLORIDE 0.9 % IJ SOLN
3.0000 mL | Freq: Two times a day (BID) | INTRAMUSCULAR | Status: DC
Start: 2015-06-01 — End: 2015-06-02
  Administered 2015-06-01: 3 mL via INTRAVENOUS

## 2015-06-01 NOTE — Progress Notes (Signed)
[facility] PROGRESS NOTE  Kimberly Downs GHW:[id number] DOB: Mar 11, 1946 DOA: 05/25/2015 PCP: No primary care provider on file.  Assessment/Plan: Suspected mesenteric ischemia. -Patient presenting with complaints of recurrent abdominal pain associate with nausea and vomiting, bowels oftentimes precipitated by by mouth intake. She has multiple cardiac vascular risk factors including ongoing tobacco abuse and history of coronary artery disease status post percutaneous intervention. -She has a history of recurrent C. difficile colitis requiring 3 prior hospitalizations. -Repeat C.diff Tox PCR was negative, oral vancomycin was discontinued.. -On 05/27/2015 she underwent colonoscopy which was unremarkable, pathology as well was unremarkable. -She was further worked up with a CTA on 05/29/2015 that revealed stenosis at [facility] and [facility] -Transferred to [facility] cone 9/29, seen by Dr. Gwenlyn Found, plan is for Abdominal aortogram, mesenteric angiogram with potential percutaneous intervention    Acute kidney injury. -Her creatinine was elevated at 1.42 on admission likely secondary to dehydration from GI losses. -Renal function improved with administration of IV fluids - Patient started to develop lower extremity edema, resumed back on low dose Lasix and monitor her renal function closely.    Hypertension. -Blood pressures are stable -Continue with Imdur and Cardizem    History of recurrent C. difficile colitis. -Stool for C. difficile toxin during this hospitalization was negative. - stopped oral vancomycin   Diabetes mellitus. -Blood sugars are controlled, continue sliding scale coverage  Hypokalemia - Repleted, monitor closely  Code Status: Full code Family Communication: None at bedside Disposition Plan: pending procedure by Dr. Gwenlyn Found this Monday  Consultants: GI Cardiology Procedures: Colonoscopy performed on 05/27/2015   HPI/Subjective: Kimberly Downs is a 69 year old female with a past  medical history of recurrent C. difficile colitis, type 2 diabetes mellitus, hypertension, coronary artery disease, admitted to the medicine service on 05/25/2015 when she presented with complaints of abdominal pain associated with nausea, vomiting and diarrhea. She is currently visiting the area from [place].GI was consulted as she was evaluated by Dr.Mann, who recommended further workup with colonoscopy given her complaints of 30 pound weight loss. On 05/27/2015 she underwent colonoscopy, procedure performed by Dr. Benson Norway that was unremarkable. She was further worked up with a CTA on 05/29/2015 that revealed stenosis at [facility] and [facility] with radiology reporting that this could be severe enough to account for occlusive mesenteric ischemia. Case was discussed with cardiology who recommended a transfer to [facility] to undergo possible catheter angiography, seen by Dr. Gwenlyn Found, plan for aortogram/GEN trig angiogram with possible intervention this coming Monday.  Objective: Filed Vitals:   06/01/15 0553  BP: 128/54  Pulse: 68  Temp: 98.2 F (36.8 C)  Resp: 18    Intake/Output Summary (Last 24 hours) at 06/01/15 1200 Last data filed at 06/01/15 0439  Gross per 24 hour  Intake    360 ml  Output      0 ml  Net    360 ml   Filed Weights   05/30/15 0552 05/31/15 0700 06/01/15 0553  Weight: 63.504 kg (140 lb) 64.2 kg (141 lb 8.6 oz) 63.8 kg (140 lb 10.5 oz)    Exam:   General:  [name], awake and alert, sitting up in her bed conversive  Cardiovascular: Regular rate and rhythm normal S1-S2 no murmurs rubs or gallops  Respiratory: Normal respiratory effort lungs are clear  Abdomen: Soft having tenderness over her lower abdominal region, no rebound tenderness or guarding  Musculoskeletal: +1 edema  Data Reviewed: Basic Metabolic Panel:  Recent Labs Lab 05/26/15 0345  05/27/15 0414 05/28/15 0519 05/29/15 0425 05/31/15 0710  NA 139 137 140 140 138  K 4.6 5.0  4.5 4.2 3.3*  CL 111 107 109 107 100*  CO2 '24 25 23 26 31  '$ GLUCOSE 97 107* 110* 129* 121*  BUN 10 5* <5* 8 <5*  CREATININE 1.05* 0.72 0.73 0.73 0.93  CALCIUM 8.3* 8.5* 8.6* 8.7* 9.0   Liver Function Tests:  Recent Labs Lab 05/25/15 1406  AST 31  ALT 12*  ALKPHOS 120  BILITOT 0.5  PROT 7.1  ALBUMIN 3.3*    Recent Labs Lab 05/25/15 1406  LIPASE 31   No results for input(s): AMMONIA in the last 168 hours. CBC:  Recent Labs Lab 05/25/15 1406  05/26/15 1755 05/27/15 0414 05/28/15 0519 05/29/15 0425 05/31/15 0710  WBC 15.6*  < > 7.1 5.4 6.5 5.7 8.9  NEUTROABS 12.3*  --   --   --   --   --   --   HGB 11.7*  < > 9.4* 9.0* 9.2* 8.8* 9.7*  HCT 35.6*  < > 29.4* 28.5* 29.3* 27.6* 30.7*  MCV 84.8  < > 87.2 87.7 87.7 85.4 87.2  PLT 673*  < > 540* 510* 475* 512* 594*  < > = values in this interval not displayed. Cardiac Enzymes: No results for input(s): CKTOTAL, CKMB, CKMBINDEX, TROPONINI in the last 168 hours. BNP (last 3 results) No results for input(s): BNP in the last 8760 hours.  ProBNP (last 3 results) No results for input(s): PROBNP in the last 8760 hours.  CBG:  Recent Labs Lab 05/31/15 0834 05/31/15 1146 05/31/15 1629 05/31/15 2208 06/01/15 0743  GLUCAP 114* 182* 162* 130* 91    Recent Results (from the past 240 hour(s))  Urine culture     Status: None   Collection Time: 05/25/15  2:00 PM  Result Value Ref Range Status   Specimen Description URINE, CLEAN CATCH  Final   Special Requests NONE  Final   Culture   Final    MULTIPLE SPECIES PRESENT, SUGGEST RECOLLECTION Performed at [facility]    Report Status 05/26/2015 FINAL  Final  Stool culture     Status: None   Collection Time: 05/25/15  2:21 PM  Result Value Ref Range Status   Specimen Description STOOL  Final   Special Requests NONE  Final   Culture   Final    NO SALMONELLA, SHIGELLA, CAMPYLOBACTER, YERSINIA, OR E.COLI 0157:H7 ISOLATED Performed at [facility]    Report  Status 05/29/2015 FINAL  Final  C difficile quick scan w PCR reflex     Status: None   Collection Time: 05/26/15 11:35 PM  Result Value Ref Range Status   C Diff antigen NEGATIVE NEGATIVE Final   C Diff toxin NEGATIVE NEGATIVE Final   C Diff interpretation Negative for toxigenic C. difficile  Final     Studies: No results found.  Scheduled Meds: . diltiazem  240 mg Oral Daily  . feeding supplement (PRO-STAT SUGAR FREE 64)  30 mL Oral BID  . furosemide  20 mg Oral Daily  . heparin subcutaneous  5,000 Units Subcutaneous 3 times per day  . insulin aspart  0-9 Units Subcutaneous TID WC  . isosorbide mononitrate  15 mg Oral Daily  . magnesium oxide  400 mg Oral BID  . pantoprazole  40 mg Oral Daily  . pentoxifylline  400 mg Oral Daily  . predniSONE  10 mg Oral Q breakfast  . pregabalin  50 mg  Oral Daily  . saccharomyces boulardii  250 mg Oral BID   Continuous Infusions:   Principal Problem:   Clostridium difficile diarrhea Active Problems:   Dehydration   Acute kidney injury ([place])   Anemia   DM (diabetes mellitus), type 2 with renal complications (HCC)   Essential hypertension   CAD (coronary artery disease)   Tobacco abuse   Recurrent colitis due to Clostridium difficile    Time spent: 35 minutes    Kimberly Downs  [facility] Pager [phone] . If 7PM-7AM, please contact night-coverage at [email or web address], password [facility] 06/01/2015, 12:00 PM  LOS: 7 days

## 2015-06-01 NOTE — Progress Notes (Signed)
VASCULAR LAB PRELIMINARY  PRELIMINARY  PRELIMINARY  PRELIMINARY  Bilateral lower extremity venous duplex completed.    Preliminary report:  There is no DVT or SVT noted in the bilateral lower extremities.   Nancyjo Givhan, RVT 06/01/2015, 11:14 AM

## 2015-06-02 ENCOUNTER — Encounter (HOSPITAL_COMMUNITY): Payer: Self-pay | Admitting: Cardiovascular Disease

## 2015-06-02 ENCOUNTER — Encounter (HOSPITAL_COMMUNITY)
Admission: EM | Disposition: A | Discharge status: Home / Self Care | Payer: Self-pay | Source: Home / Self Care | Attending: Internal Medicine

## 2015-06-02 ENCOUNTER — Ambulatory Visit (HOSPITAL_COMMUNITY): Admission: RE | Admit: 2015-06-02 | Payer: Medicare Other | Source: Ambulatory Visit | Admitting: Cardiovascular Disease

## 2015-06-02 DIAGNOSIS — Z720 Tobacco use: Secondary | ICD-10-CM

## 2015-06-02 DIAGNOSIS — K551 Chronic vascular disorders of intestine: Secondary | ICD-10-CM

## 2015-06-02 DIAGNOSIS — I10 Essential (primary) hypertension: Secondary | ICD-10-CM

## 2015-06-02 DIAGNOSIS — I2510 Atherosclerotic heart disease of native coronary artery without angina pectoris: Secondary | ICD-10-CM

## 2015-06-02 HISTORY — PX: PERIPHERAL VASCULAR CATHETERIZATION: SHX172C

## 2015-06-02 LAB — GLUCOSE, CAPILLARY
GLUCOSE-CAPILLARY: 83 mg/dL (ref 65–99)
GLUCOSE-CAPILLARY: 91 mg/dL (ref 65–99)
GLUCOSE-CAPILLARY: 184 mg/dL — AB (ref 65–99)
Glucose-Capillary: 97 mg/dL (ref 65–99)

## 2015-06-02 LAB — BASIC METABOLIC PANEL
Anion gap: 8 (ref 5–15)
BUN: 10 mg/dL (ref 6–20)
CALCIUM: 9.2 mg/dL (ref 8.9–10.3)
CHLORIDE: 104 mmol/L (ref 101–111)
CO2: 30 mmol/L (ref 22–32)
Creatinine, Ser: 1.04 mg/dL — ABNORMAL HIGH (ref 0.44–1.00)
GFR calc Af Amer: >60 mL/min (ref >60)
GFR calc non Af Amer: 53 mL/min — ABNORMAL LOW (ref >60)
Glucose, Bld: 89 mg/dL (ref 65–99)
Potassium: 4.1 mmol/L (ref 3.5–5.1)
SODIUM: 142 mmol/L (ref 135–145)

## 2015-06-02 LAB — CBC
HCT: 29.5 % — ABNORMAL LOW (ref 36.0–46.0)
Hemoglobin: 9.3 g/dL — ABNORMAL LOW (ref 12.0–15.0)
MCH: 27.6 pg (ref 26.0–34.0)
MCHC: 31.5 g/dL (ref 30.0–36.0)
MCV: 87.5 fL (ref 78.0–100.0)
PLATELETS: 564 10*3/uL — AB (ref 150–400)
RBC: 3.37 MIL/uL — ABNORMAL LOW (ref 3.87–5.11)
RDW: 15.6 % — ABNORMAL HIGH (ref 11.5–15.5)
WBC: 7.5 10*3/uL (ref 4.0–10.5)

## 2015-06-02 LAB — SURGICAL PCR SCREEN
MRSA, PCR: NEGATIVE
STAPHYLOCOCCUS AUREUS: NEGATIVE

## 2015-06-02 SURGERY — ABDOMINAL AORTOGRAM

## 2015-06-02 MED ORDER — SODIUM CHLORIDE 0.9 % IV SOLN: INTRAVENOUS | Status: AC

## 2015-06-02 MED ORDER — LIDOCAINE HCL (PF) 1 % IJ SOLN
INTRAMUSCULAR | Status: DC | PRN
  Administered 2015-06-02: 25 mL

## 2015-06-02 MED ORDER — HYDRALAZINE HCL 20 MG/ML IJ SOLN
10.0000 mg | INTRAMUSCULAR | Status: DC | PRN
Start: 2015-06-02 — End: 2015-06-02

## 2015-06-02 MED ORDER — IODIXANOL 320 MG/ML IV SOLN
INTRAVENOUS | Status: DC | PRN
  Administered 2015-06-02: 115 mL via INTRA_ARTERIAL

## 2015-06-02 MED ORDER — LIDOCAINE HCL (PF) 1 % IJ SOLN
INTRAMUSCULAR | Status: AC
  Filled 2015-06-02: qty 30

## 2015-06-02 MED ORDER — ONDANSETRON HCL 4 MG/2ML IJ SOLN
4.0000 mg | Freq: Four times a day (QID) | INTRAMUSCULAR | Status: DC | PRN
  Administered 2015-06-02: 14:00:00 4 mg via INTRAVENOUS
  Filled 2015-06-02: qty 2

## 2015-06-02 MED ORDER — FENTANYL CITRATE (PF) 100 MCG/2ML IJ SOLN
INTRAMUSCULAR | Status: DC | PRN
  Administered 2015-06-02: 25 ug via INTRAVENOUS

## 2015-06-02 MED ORDER — MIDAZOLAM HCL 2 MG/2ML IJ SOLN
INTRAMUSCULAR | Status: DC | PRN
  Administered 2015-06-02: 1 mg via INTRAVENOUS

## 2015-06-02 MED ORDER — MIDAZOLAM HCL 2 MG/2ML IJ SOLN
INTRAMUSCULAR | Status: AC
  Filled 2015-06-02: qty 4

## 2015-06-02 MED ORDER — HYDROCODONE-ACETAMINOPHEN 5-325 MG PO TABS
ORAL_TABLET | ORAL | Status: AC
  Filled 2015-06-02: qty 1

## 2015-06-02 MED ORDER — ACETAMINOPHEN 325 MG PO TABS: 650.0000 mg | ORAL_TABLET | ORAL | Status: DC | PRN

## 2015-06-02 MED ORDER — FENTANYL CITRATE (PF) 100 MCG/2ML IJ SOLN
INTRAMUSCULAR | Status: AC
  Filled 2015-06-02: qty 4

## 2015-06-02 MED ORDER — HEPARIN (PORCINE) IN NACL 2-0.9 UNIT/ML-% IJ SOLN
INTRAMUSCULAR | Status: AC
  Filled 2015-06-02: qty 1000

## 2015-06-02 MED ORDER — HYDROCODONE-ACETAMINOPHEN 7.5-325 MG PO TABS: 0.5000 | ORAL_TABLET | Freq: Two times a day (BID) | ORAL | Status: DC

## 2015-06-02 SURGICAL SUPPLY — 11 items
CATH ANGIO 5F PIGTAIL 65CM (CATHETERS) ×2 IMPLANT
GUIDE CATH VISTA JR4 6F (CATHETERS) ×2 IMPLANT
KIT PV (KITS) ×2 IMPLANT
SHEATH PINNACLE 5F 10CM (SHEATH) IMPLANT
SHEATH PINNACLE 6F 10CM (SHEATH) ×2 IMPLANT
STOPCOCK MORSE 400PSI 3WAY (MISCELLANEOUS) ×2 IMPLANT
SYRINGE MEDRAD AVANTA MACH 7 (SYRINGE) ×2 IMPLANT
TRANSDUCER W/STOPCOCK (MISCELLANEOUS) ×2 IMPLANT
TRAY PV CATH (CUSTOM PROCEDURE TRAY) ×2 IMPLANT
TUBING CIL FLEX 10 FLL-RA (TUBING) ×2 IMPLANT
WIRE HITORQ VERSACORE ST 145CM (WIRE) ×2 IMPLANT

## 2015-06-02 NOTE — Discharge Summary (Addendum)
[place]s, is a 69 y.o. female  DOB 1946/04/27  MRN [id number].  Admission date:  05/25/2015  Admitting Physician  Modena Jansky, MD  Discharge Date:  06/02/2015   Primary MD  No primary care provider on file.  Recommendations for primary care physician for things to follow:  - Check labs including CBC, BMP during next visit.   Admission Diagnosis  Dehydration [E86.0] C. difficile colitis [A04.7]   Discharge Diagnosis  Dehydration [E86.0] C. difficile colitis [A04.7]    Principal Problem:   Clostridium difficile diarrhea Active Problems:   Dehydration   Acute kidney injury ([place])   Anemia   DM (diabetes mellitus), type 2 with renal complications ([facility])   Essential hypertension   CAD (coronary artery disease)   Tobacco abuse   Recurrent colitis due to Clostridium difficile      Past Medical History  Diagnosis Date  . C. difficile diarrhea   . Hypertension   . Diabetes mellitus without complication ([place])   . Coronary artery disease     Past Surgical History  Procedure Laterality Date  . Abdominal hysterectomy    . Appendectomy    . Cholecystectomy    . Bowel resection    . Coronary angioplasty with stent placement    . Colonoscopy  Early September 2016  . Colonoscopy Left 05/27/2015    Procedure: COLONOSCOPY;  Surgeon: Carol Ada, MD;  Location: [facility];  Service: Endoscopy;  Laterality: Left;  . Peripheral vascular catheterization N/A 06/02/2015    Procedure: Abdominal Aortogram;  Surgeon: Lorretta Harp, MD;  Location: [place] [facility];  Service: Cardiovascular;  Laterality: N/A;       History of present illness and  Hospital Course:     Kindly see H&P for history of present illness and admission details, please review complete Labs, Consult reports and Test reports for all details in brief  HPI  from the history and physical done on the day of  admission Kimber[place]s is a 69 y.o. female , widowed, independent of activities at home and uses wheelchair walker when she steps out of her home, lives in [place], was visiting son in [place] for the last 4 days, PMH of recurrent severe C. difficile (3 since 02/28/2015), DM 2, HTN, CAD status post stent, ongoing tobacco abuse, presented to the [facility] ED on 05/25/15 with complaints of profuse diarrhea, abdominal pain, nausea and vomiting. Her last hospitalization in [place] was approximately 2 weeks ago when she underwent colonoscopy and was told by her gastroenterologist that she has C. difficile colitis. She was also informed that if current medical management fails then they may have to consider fecal transplantation. With medical treatment, her symptoms improved with decrease in diarrhea, abdominal pain and vomiting. She has lost approximately 30 pounds since July. She was in her usual state of health until 2 nights ago when after eating some rice from a [place] food place, started having worsening diarrhea-up to every half hour, watery, yellow in color with  mucus, last 2 episodes noted some bright red blood admixed with stools, intermittent severe lower abdominal pain, couple of episodes of nonbloody emesis and followed by dry heaving, decreased appetite, chills but no fevers. Feels generally weak and worn out. In the ED, hemodynamically stable, lab work significant for creatinine 1.4 to WBC 15.6 hemoglobin 11.7, platelets 673. No baseline labs to compare. Hospitalist admission was requested for recurrent C. difficile colitis, dehydration and acute kidney injury.   Hospital Course  Patient C. difficile was negative by PCR, oral vancomycin has been stopped, there was suspicion for mesenteric ischemia,underwent abdomianl aortogram, selective celiac artery angiogram and superior mesenteric arteriogram, no evidence of mesenteric artery ischemia. Superior mesenteric artery 40-50%  ost stenosis without obstructive dx, 40-50% ost celiac dx.   Suspected mesenteric ischemia. -Patient presenting with complaints of recurrent abdominal pain associate with nausea and vomiting, bowels oftentimes precipitated by by mouth intake. She has multiple cardiac vascular risk factors including ongoing tobacco abuse and history of coronary artery disease status post percutaneous intervention. -She has a history of recurrent C. difficile colitis requiring 3 prior hospitalizations. -Repeat C.diff Tox PCR was negative, oral vancomycin was discontinued.. -On 05/27/2015 she underwent colonoscopy which was unremarkable, pathology as well was unremarkable. -She was further worked up with a CTA on 05/29/2015 that revealed stenosis at [facility] and IMA -Transferred to [place], seen by Dr. Gwyndolyn Saxon abdomianl aortogram, selective celiac artery angiogram and superior mesenteric arteriogram, no evidence of mesenteric artery ischemia. Superior mesenteric artery 40-50% ost stenosis without obstructive dx, 40-50% ost celiac dx. this degree of obstruction would not explain patient's symptoms, she is with known chronic abdominal pain for few years with acute flares.   Acute kidney injury. -Her creatinine was elevated at 1.42 on admission likely secondary to dehydration from GI losses. -Renal function improved with administration of IV fluid, creatinine is 1.04 on 10/3    Hypertension. -Resume home medication   History of recurrent C. difficile colitis. -Stool for C. difficile toxin during this hospitalization was negative. - stopped oral vancomycin  Diabetes mellitus. -CBG is being controlled on insulin sliding scale during hospital stay, resume home medication on discharge  Hypokalemia - Repleted,      Discharge Condition:  Stable   Follow UP      Discharge Instructions  and  Discharge Medications         Discharge Instructions    Diet - low sodium heart healthy     Complete by:  As directed      Discharge instructions    Complete by:  As directed   Follow with Primary MD  in 7 days   Get CBC, CMP, 2 view Chest X ray checked  by Primary MD next visit.      Disposition Home **   Diet: Heart Healthy , carbohydrate modified , with feeding assistance and aspiration precautions.  For Heart failure patients - Check your Weight same time everyday, if you gain over 2 pounds, or you develop in leg swelling, experience more shortness of breath or chest pain, call your Primary MD immediately. Follow Cardiac Low Salt Diet and 1.5 lit/day fluid restriction.   On your next visit with your primary care physician please Get Medicines reviewed and adjusted.   Please request your Prim.MD to go over all Hospital Tests and Procedure/Radiological results at the follow up, please get all Hospital records sent to your Prim MD by signing hospital release before you go home.   If you experience worsening of  your admission symptoms, develop shortness of breath, life threatening emergency, suicidal or homicidal thoughts you must seek medical attention immediately by calling 911 or calling your MD immediately  if symptoms less severe.  You Must read complete instructions/literature along with all the possible adverse reactions/side effects for all the Medicines you take and that have been prescribed to you. Take any new Medicines after you have completely understood and accpet all the possible adverse reactions/side effects.   Do not drive, operating heavy machinery, perform activities at heights, swimming or participation in water activities or provide baby sitting services if your were admitted for syncope or siezures until you have seen by Primary MD or a Neurologist and advised to do so again.  Do not drive when taking Pain medications.    Do not take more than prescribed Pain, Sleep and Anxiety Medications  Special Instructions: If you have smoked or chewed Tobacco   in the last 2 yrs please stop smoking, stop any regular Alcohol  and or any Recreational drug use.  Wear Seat belts while driving.   Please note  You were cared for by a hospitalist during your hospital stay. If you have any questions about your discharge medications or the care you received while you were in the hospital after you are discharged, you can call the unit and asked to speak with the hospitalist on call if the hospitalist that took care of you is not available. Once you are discharged, your primary care physician will handle any further medical issues. Please note that NO REFILLS for any discharge medications will be authorized once you are discharged, as it is imperative that you return to your primary care physician (or establish a relationship with a primary care physician if you do not have one) for your aftercare needs so that they can reassess your need for medications and monitor your lab values.            Medication List    STOP taking these medications        vancomycin 250 MG capsule  Commonly known as:  VANCOCIN      TAKE these medications        COMBIVENT RESPIMAT 20-100 MCG/ACT Aers respimat  Generic drug:  Ipratropium-Albuterol  Inhale 1 puff into the lungs daily as needed.  Notes to Patient:  Breathing      diltiazem 240 MG 24 hr capsule  Commonly known as:  CARDIZEM CD  Take 240 mg by mouth daily.  Notes to Patient:  Heart and blood pressure     FLORASTOR 250 MG capsule  Generic drug:  saccharomyces boulardii  Take 250 mg by mouth daily.     furosemide 20 MG tablet  Commonly known as:  LASIX  Take 20 mg by mouth daily.     HYDROcodone-acetaminophen 7.5-325 MG tablet  Commonly known as:  NORCO  Take 0.5 tablets by mouth 2 (two) times daily.     isosorbide mononitrate 30 MG 24 hr tablet  Commonly known as:  IMDUR  Take 15 mg by mouth daily.  Notes to Patient:  Increases blood flow to heart Decreases chest pain and decreases blood pressure       lisinopril 40 MG tablet  Commonly known as:  PRINIVIL,ZESTRIL  Take 40 mg by mouth daily.  Notes to Patient:  Blood pressure     LYRICA 50 MG capsule  Generic drug:  pregabalin  Take 1 capsule by mouth daily.  Notes to Patient:  Pain  magnesium oxide 400 (241.3 MG) MG tablet  Commonly known as:  MAG-OX  Take 1 tablet by mouth 2 (two) times daily.  Notes to Patient:  Supplement     ondansetron 4 MG tablet  Commonly known as:  ZOFRAN  Take 1 tablet by mouth every 6 (six) hours as needed for nausea or vomiting.  Notes to Patient:  Nausea     pentoxifylline 400 MG CR tablet  Commonly known as:  TRENTAL  Take 400 mg by mouth daily.     potassium chloride 10 MEQ tablet  Commonly known as:  K-DUR  Take 10 mEq by mouth daily.  Notes to Patient:  Supplement     predniSONE 5 MG tablet  Commonly known as:  DELTASONE  Take 5 mg by mouth daily with breakfast.  Notes to Patient:  Anti-Inflammatory     VENTOLIN HFA 108 (90 BASE) MCG/ACT inhaler  Generic drug:  albuterol  Inhale 2 puffs into the lungs every 6 (six) hours as needed.  Notes to Patient:  Breathing          Diet and Activity recommendation: See Discharge Instructions above   Consults obtained - GI Cardiology  Major procedures and Radiology Reports - PLEASE review detailed and final reports for all details, in brief -  Colonoscopy performed on 05/27/2015 1) End-to-side anastamosis at 10 cm above the dentate line. 2) Left sided diverticula.  06/02/2015 by Dr. Gwenlyn Found: abdomianl aortogram, selective celiac artery angiogram and superior mesenteric arteriogram, no evidence of mesenteric artery ischemia. Superior mesenteric artery 40-50% ost stenosis without obstructive dx, 40-50% ost celiac dx.   Ct Angio Abd/pel W/ And/or W/o  [place]/2016   CLINICAL DATA:  Lower abdominal/pelvic pain with n/vd off/on x4 months and 30lb. Wt. Loss, eval for mesenteric ischemia, enteritis and enterocolitis, Surgery-complete  hysterectomy, appy, gb, heart stents, partial colon resection  EXAM: CT ANGIOGRAPHY ABDOMEN AND PELVIS  TECHNIQUE: Multidetector CT imaging of the abdomen and pelvis was performed using the standard protocol during bolus administration of intravenous contrast. Multiplanar reconstructed images including MIPs were obtained and reviewed to evaluate the vascular anatomy.  CONTRAST:  124m OMNIPAQUE IOHEXOL 350 MG/ML SOLN  COMPARISON:  COMPARISON None available  FINDINGS: ARTERIAL FINDINGS:  Aorta: Moderate scattered calcified plaque. No aneurysm, dissection, or stenosis.  Celiac axis: Calcified origin plaque resulting in short segment stenosis of probable hemodynamic significance. Patent distally.  Superior mesenteric: Calcified plaque at the origin resulting in short segment stenosis of possible hemodynamic significance, patent distally. Hepatic artery arises from the SMA, an anatomic variant.  Left renal: Partially calcified ostial plaque without high-grade stenosis, patent distally  Right renal: Partially calcified ostial plaque resulting in at least mild short segment stenosis. Early bifurcation into anterior and posterior divisions, patent distally.  Inferior mesenteric: Patent, with short-segment origin stenosis related to aortic wall plaque.  Left iliac: Coarse calcifications diffusely without high-grade stenosis. No dissection or aneurysm.  Right iliac: Extensive coarse calcifications predominately in the common and internal iliac arteries. No high-grade stenosis, dissection or aneurysm.  Venous findings: Patent hepatic veins, portal vein, superior mesenteric vein, splenic vein, bilateral renal veins, IVC, and iliac venous system.  Review of the MIP images confirms the above findings.  Nonvascular findings: Coarse scarring in the visualized lung bases. 4.6 cm benign hemangioma in hepatic segment 7, 2 cm hemangioma in segment 4, and two 1 cm technically nonspecific lesions in segment 2 with similar enhancement  characteristics , probably also small hemangiomas. Surgical clips in the gallbladder fossa.  Dilated CBD seen down to the ampulla. Mild central intrahepatic biliary ductal dilatation. Unremarkable spleen, adrenal glands, pancreas, right kidney. 3 mm calculus in the lower pole left renal collecting system. No hydronephrosis. Stomach, small bowel, and colon are nondilated. Urinary bladder incompletely distended. Bilateral pelvic phleboliths. Previous hysterectomy. No ascites. No free air. No adenopathy. Mild lumbar levoscoliosis with asymmetric facet disease and degenerative disc disease L5-S1.  IMPRESSION: 1. Origin stenoses of celiac axis, SMA, and IMA which may be of adequate severity to account for occlusive mesenteric ischemia. Consider catheter angiography for further evaluation, as the lesions may be amenable to percutaneous treatment if confirmed to be hemodynamically significant. 2. Dilated CBD and mildly dilated central intrahepatic bile ducts post cholecystectomy. Correlate with any clinical or laboratory evidence of biliary obstruction. 3. Multiple benign hepatic hemangiomas. 4. Left nephrolithiasis without hydronephrosis.   Electronically Signed   By: Lucrezia Europe M.D.   On: 05/29/2015 11:06    Micro Results     Recent Results (from the past 240 hour(s))  Urine culture     Status: None   Collection Time: 05/25/15  2:00 PM  Result Value Ref Range Status   Specimen Description URINE, CLEAN CATCH  Final   Special Requests NONE  Final   Culture   Final    MULTIPLE SPECIES PRESENT, SUGGEST RECOLLECTION Performed at [facility]    Report Status 05/26/2015 FINAL  Final  Stool culture     Status: None   Collection Time: 05/25/15  2:21 PM  Result Value Ref Range Status   Specimen Description STOOL  Final   Special Requests NONE  Final   Culture   Final    NO SALMONELLA, SHIGELLA, CAMPYLOBACTER, YERSINIA, OR E.COLI 0157:H7 ISOLATED Performed at [facility]    Report Status  05/29/2015 FINAL  Final  C difficile quick scan w PCR reflex     Status: None   Collection Time: 05/26/15 11:35 PM  Result Value Ref Range Status   C Diff antigen NEGATIVE NEGATIVE Final   C Diff toxin NEGATIVE NEGATIVE Final   C Diff interpretation Negative for toxigenic C. difficile  Final  Surgical pcr screen     Status: None   Collection Time: 06/02/15  8:03 AM  Result Value Ref Range Status   MRSA, PCR NEGATIVE NEGATIVE Final   Staphylococcus aureus NEGATIVE NEGATIVE Final    Comment:        The Xpert SA Assay (FDA approved for NASAL specimens in patients over 63 years of age), is one component of a comprehensive surveillance program.  Test performance has been validated by [facility] for patients greater than or equal to 64 year old. It is not intended to diagnose infection nor to guide or monitor treatment.        Today   Subjective:   Kylen Ismael today has no headache,no chest abdominal pain,no new weakness tingling or numbness, feels much better wants to go home today.   Objective:   Blood pressure 108/78, pulse 76, temperature 98 F (36.7 C), temperature source Oral, resp. rate 19, height 5' 0.5" (1.537 m), weight 61.689 kg (136 lb), SpO2 94 %.   Intake/Output Summary (Last 24 hours) at 06/02/15 1821 Last data filed at 06/02/15 1200  Gross per 24 hour  Intake 218.75 ml  Output      0 ml  Net 218.75 ml    Exam Awake Alert, Oriented x 3, No new F.N deficits, Normal affect [place],PERRAL Supple Neck,No JVD, No  cervical lymphadenopathy appriciated.  Symmetrical Chest wall movement, Good air movement bilaterally, CTAB RRR,No Gallops,Rubs or new Murmurs, No Parasternal Heave +ve B.Sounds, Abd Soft, Non tender, No organomegaly appriciated, No rebound -guarding or rigidity. No Cyanosis, Clubbing or edema, No new Rash or bruise  Data Review   CBC w Diff:  Lab Results  Component Value Date   WBC 7.5 06/02/2015   HGB 9.3* 06/02/2015   HCT 29.5*  06/02/2015   PLT 564* 06/02/2015   LYMPHOPCT 15 05/25/2015   MONOPCT 6 05/25/2015   EOSPCT 0 05/25/2015   BASOPCT 0 05/25/2015    CMP:  Lab Results  Component Value Date   NA 142 06/02/2015   K 4.1 06/02/2015   CL 104 06/02/2015   CO2 30 06/02/2015   BUN 10 06/02/2015   CREATININE 1.04* 06/02/2015   PROT 7.1 05/25/2015   ALBUMIN 3.3* 05/25/2015   BILITOT 0.5 05/25/2015   ALKPHOS 120 05/25/2015   AST 31 05/25/2015   ALT 12* 05/25/2015  .   Total Time in preparing paper work, data evaluation and todays exam - 35 minutes  Raegan Sipp M.D on 06/02/2015 at 6:21 PM  [facility]   Office  [phone]

## 2015-06-02 NOTE — Progress Notes (Signed)
Patient Name: Kimberly Downs Date of Encounter: 06/02/2015  Primary cardiologist: new Dr. Gwenlyn Found   Principal Problem:   Clostridium difficile diarrhea Active Problems:   Dehydration   Acute kidney injury ([place])   Anemia   DM (diabetes mellitus), type 2 with renal complications ([place])   Essential hypertension   CAD (coronary artery disease)   Tobacco abuse   Recurrent colitis due to Clostridium difficile    SUBJECTIVE  Denies any discomfort, a little drowsy after procedure.  CURRENT MEDS . diltiazem  240 mg Oral Daily  . feeding supplement (PRO-STAT SUGAR FREE 64)  30 mL Oral BID  . furosemide  20 mg Oral Daily  . heparin subcutaneous  5,000 Units Subcutaneous 3 times per day  . insulin aspart  0-9 Units Subcutaneous TID WC  . isosorbide mononitrate  15 mg Oral Daily  . magnesium oxide  400 mg Oral BID  . pantoprazole  40 mg Oral Daily  . pentoxifylline  400 mg Oral Daily  . predniSONE  10 mg Oral Q breakfast  . pregabalin  50 mg Oral Daily  . saccharomyces boulardii  250 mg Oral BID    OBJECTIVE  Filed Vitals:   06/02/15 0935 06/02/15 0945 06/02/15 1000 06/02/15 1022  BP: 134/46 135/43 121/45 130/40  Pulse: 63 56 57   Temp:      TempSrc:      Resp: '12 16 15   '$ Height:      Weight:      SpO2: 90% 90% 89%     Intake/Output Summary (Last 24 hours) at 06/02/15 1029 Last data filed at 06/02/15 0743  Gross per 24 hour  Intake    360 ml  Output      0 ml  Net    360 ml   Filed Weights   05/31/15 0700 06/01/15 0553 06/02/15 0500  Weight: 141 lb 8.6 oz (64.2 kg) 140 lb 10.5 oz (63.8 kg) 136 lb (61.689 kg)    PHYSICAL EXAM  General: Pleasant, NAD. Neuro: Alert and oriented X 3. Moves all extremities spontaneously. Psych: Normal affect. HEENT:  Normal  Neck: Supple without bruits or JVD. Lungs:  Resp regular and unlabored, CTA. Heart: RRR no s3, s4, or murmurs. Abdomen: Soft, non-tender, non-distended, BS + x 4. R femoral cath site stable.  Extremities: No  clubbing, cyanosis or edema. DP/PT/Radials 2+ and equal bilaterally.  Accessory Clinical Findings  CBC  Recent Labs  06/01/15 1817 06/02/15 0628  WBC 7.6 7.5  HGB 9.0* 9.3*  HCT 29.1* 29.5*  MCV 86.9 87.5  PLT 517* 614*   Basic Metabolic Panel  Recent Labs  06/01/15 1817 06/02/15 0628  NA 139 142  K 4.8 4.1  CL 101 104  CO2 29 30  GLUCOSE 123* 89  BUN 8 10  CREATININE 1.01* 1.04*  CALCIUM 9.2 9.2   TELE NSR without significant ventricular ectopy    ECG  No new EKG   Radiology/Studies  Ct Angio Abd/pel W/ And/or W/o  05/29/2015   CLINICAL DATA:  Lower abdominal/pelvic pain with n/vd off/on x4 months and 30lb. Wt. Loss, eval for mesenteric ischemia, enteritis and enterocolitis, Surgery-complete hysterectomy, appy, gb, heart stents, partial colon resection  EXAM: CT ANGIOGRAPHY ABDOMEN AND PELVIS  TECHNIQUE: Multidetector CT imaging of the abdomen and pelvis was performed using the standard protocol during bolus administration of intravenous contrast. Multiplanar reconstructed images including MIPs were obtained and reviewed to evaluate the vascular anatomy.  CONTRAST:  146m OMNIPAQUE IOHEXOL 350 MG/ML  SOLN  COMPARISON:  COMPARISON None available  FINDINGS: ARTERIAL FINDINGS:  Aorta: Moderate scattered calcified plaque. No aneurysm, dissection, or stenosis.  Celiac axis: Calcified origin plaque resulting in short segment stenosis of probable hemodynamic significance. Patent distally.  Superior mesenteric: Calcified plaque at the origin resulting in short segment stenosis of possible hemodynamic significance, patent distally. Hepatic artery arises from the SMA, an anatomic variant.  Left renal: Partially calcified ostial plaque without high-grade stenosis, patent distally  Right renal: Partially calcified ostial plaque resulting in at least mild short segment stenosis. Early bifurcation into anterior and posterior divisions, patent distally.  Inferior mesenteric: Patent, with  short-segment origin stenosis related to aortic wall plaque.  Left iliac: Coarse calcifications diffusely without high-grade stenosis. No dissection or aneurysm.  Right iliac: Extensive coarse calcifications predominately in the common and internal iliac arteries. No high-grade stenosis, dissection or aneurysm.  Venous findings: Patent hepatic veins, portal vein, superior mesenteric vein, splenic vein, bilateral renal veins, IVC, and iliac venous system.  Review of the MIP images confirms the above findings.  Nonvascular findings: Coarse scarring in the visualized lung bases. 4.6 cm benign hemangioma in hepatic segment 7, 2 cm hemangioma in segment 4, and two 1 cm technically nonspecific lesions in segment 2 with similar enhancement characteristics , probably also small hemangiomas. Surgical clips in the gallbladder fossa. Dilated CBD seen down to the ampulla. Mild central intrahepatic biliary ductal dilatation. Unremarkable spleen, adrenal glands, pancreas, right kidney. 3 mm calculus in the lower pole left renal collecting system. No hydronephrosis. Stomach, small bowel, and colon are nondilated. Urinary bladder incompletely distended. Bilateral pelvic phleboliths. Previous hysterectomy. No ascites. No free air. No adenopathy. Mild lumbar levoscoliosis with asymmetric facet disease and degenerative disc disease L5-S1.  IMPRESSION: 1. Origin stenoses of celiac axis, SMA, and IMA which may be of adequate severity to account for occlusive mesenteric ischemia. Consider catheter angiography for further evaluation, as the lesions may be amenable to percutaneous treatment if confirmed to be hemodynamically significant. 2. Dilated CBD and mildly dilated central intrahepatic bile ducts post cholecystectomy. Correlate with any clinical or laboratory evidence of biliary obstruction. 3. Multiple benign hepatic hemangiomas. 4. Left nephrolithiasis without hydronephrosis.   Electronically Signed   By: Lucrezia Europe M.D.   On:  05/29/2015 11:06    ASSESSMENT AND PLAN  69 yo female with PMH of HTN, DM, tobacco abuse and CAD presented with several month of postprandial abdominal pain with food avoidance and 30 lbs weight loss. Cardiology consulted for possible mesenteric ischemia. Colonoscopy did not show any mucosal changes of ischemic colitis, however abdominal CT showed high grade dx in celiac acid, superior and inferior mesenteric artery dx.  1. Abdominal pain  - underwent abdomianl aortogram, selective celiac artery angiogram and superior mesenteric arteriogram, no evidence of mesenteric artery ischemia. Superior mesenteric artery 40-50% ost stenosis without obstructive dx, 40-50% ost celiac dx.   - defer further workup per GI and IM, differential diagnosis include SMA syndrome, median arcuate ligament syndrome, adhesion etc although would have expected the CT of abdomen to have r/o above possibilities, please call cardiology if any further question    - R femoral cath site stable, no lifting > 5 lbs for 1 week  2. CAD 3. HTN 4. DM 5. Tobacco abuse  Signed, [name] PA-C Pager: [phone]  I have seen and examined the patient along with [name] PA-C.  I have reviewed the chart, notes and new data.  I agree with PA/NP's note.  PLAN:  Mesenteric angio did not confirm significant celiac or mesenteric stenosis and no intervention was needed. From CV point of view, focus on risk factors: smoking cessation, HgbA1c<7%, LDL-C <70, BP control. Please reconsult if there are new questions.  Sanda Klein, MD, [place] [phone] 06/02/2015, 12:25 PM

## 2015-06-02 NOTE — Progress Notes (Signed)
Site area: right groin  Site Prior to Removal:  Level 0  Pressure Applied For 20 MINUTES    Minutes Beginning at 0910  Manual:   Yes.    Patient Status During Pull:  stable  Post Pull Groin Site:  Level 0  Post Pull Instructions Given:  Yes.    Post Pull Pulses Present:  Yes.    Dressing Applied:  Yes.     Bedrest Begins: 0930  Comments:  Pt tolerated right femoral sheath pull well. VSS

## 2015-06-02 NOTE — H&P (View-Only) (Signed)
[facility] PROGRESS NOTE  Kimberly Downs ZES:[id number] DOB: 1945/12/06 DOA: 05/25/2015 PCP: No primary care provider on file.  Assessment/Plan: 1. Suspected mesenteric ischemia. -Patient presenting with complaints of recurrent abdominal pain associate with nausea and vomiting, bowels oftentimes precipitated by by mouth intake. She has multiple cardiac vascular risk factors including ongoing tobacco abuse and history of coronary artery disease status post percutaneous intervention. -She has a history of recurrent C. difficile colitis requiring 3 prior hospitalizations. -Repeat C.diff Tox PCR was negative, oral vancomycin was discontinued.. -On 05/27/2015 she underwent colonoscopy which was unremarkable, pathology as well was unremarkable. -She was further worked up with a CTA on 05/29/2015 that revealed stenosis at [facility] and IMA -Transferred to [facility] cone 9/29, seen by Dr. Gwenlyn Found, plan is for Abdominal aortogram, mesenteric angiogram with potential percutaneous intervention  2.  Acute kidney injury. -Her creatinine was elevated at 1.42 on admission likely secondary to dehydration from GI losses. -Renal function improved with administration of IV fluids - Patient started to develop lower extremity edema, will resume back on low dose Lasix and monitor her renal function closely.  3.  Hypertension. -Blood pressures are stable -Continue with Imdur and Cardizem  4.  History of recurrent C. difficile colitis. -Stool for C. difficile toxin during this hospitalization was negative. - We'll discontinue oral vancomycin  5.  Diabetes mellitus. -Blood sugars are controlled, continue sliding scale coverage  Code Status: Full code Family Communication: None at bedside Disposition Plan: During procedure by Dr. Gwenlyn Found this Monday  Consultants: GI Cardiology Procedures: Colonoscopy performed on 05/27/2015   HPI/Subjective: Kimberly Downs is a 69 year old female with a past medical history of  recurrent C. difficile colitis, type 2 diabetes mellitus, hypertension, coronary artery disease, admitted to the medicine service on 05/25/2015 when she presented with complaints of abdominal pain associated with nausea, vomiting and diarrhea. She is currently visiting the area from [place].GI was consulted as she was evaluated by Dr.Mann, who recommended further workup with colonoscopy given her complaints of 30 pound weight loss. On 05/27/2015 she underwent colonoscopy, procedure performed by Dr. Benson Norway that was unremarkable. She was further worked up with a CTA on 05/29/2015 that revealed stenosis at [facility] and IMA with radiology reporting that this could be severe enough to account for occlusive mesenteric ischemia. Case was discussed with cardiology who recommended a transfer to [facility] to undergo possible catheter angiography, seen by Dr. Gwenlyn Found, plan for aortogram/GEN trig angiogram with possible intervention this coming Monday.  Objective: Filed Vitals:   05/30/15 0531  BP: 131/77  Pulse: 62  Temp: 98 F (36.7 C)  Resp: 13    Intake/Output Summary (Last 24 hours) at 05/30/15 1126 Last data filed at 05/30/15 0542  Gross per 24 hour  Intake    360 ml  Output   1801 ml  Net  -1441 ml   Filed Weights   05/29/15 0617 05/29/15 1541 05/30/15 0552  Weight: 59.149 kg (130 lb 6.4 oz) 63.504 kg (140 lb) 63.504 kg (140 lb)    Exam:   General:  [name], awake and alert, sitting up in her bed conversive  Cardiovascular: Regular rate and rhythm normal S1-S2 no murmurs rubs or gallops  Respiratory: Normal respiratory effort lungs are clear  Abdomen: Soft having tenderness over her lower abdominal region, no rebound tenderness or guarding  Musculoskeletal: +1 edema  Data Reviewed: Basic Metabolic Panel:  Recent Labs Lab 05/25/15 1406 05/26/15 0345 05/27/15 0414 05/28/15 0519 05/29/15 0425  NA 136 139 137 140 140  K 4.0 4.6 5.0 4.5 4.2  CL 100*  111 107 109 107  CO2 '23 24 25 23 26  '$ GLUCOSE 134* 97 107* 110* 129*  BUN 16 10 5* <5* 8  CREATININE 1.42* 1.05* 0.72 0.73 0.73  CALCIUM 9.7 8.3* 8.5* 8.6* 8.7*   Liver Function Tests:  Recent Labs Lab 05/25/15 1406  AST 31  ALT 12*  ALKPHOS 120  BILITOT 0.5  PROT 7.1  ALBUMIN 3.3*    Recent Labs Lab 05/25/15 1406  LIPASE 31   No results for input(s): AMMONIA in the last 168 hours. CBC:  Recent Labs Lab 05/25/15 1406  05/26/15 0345 05/26/15 1755 05/27/15 0414 05/28/15 0519 05/29/15 0425  WBC 15.6*  < > 7.2 7.1 5.4 6.5 5.7  NEUTROABS 12.3*  --   --   --   --   --   --   HGB 11.7*  < > 9.2* 9.4* 9.0* 9.2* 8.8*  HCT 35.6*  < > 29.5* 29.4* 28.5* 29.3* 27.6*  MCV 84.8  < > 88.3 87.2 87.7 87.7 85.4  PLT 673*  < > 550* 540* 510* 475* 512*  < > = values in this interval not displayed. Cardiac Enzymes: No results for input(s): CKTOTAL, CKMB, CKMBINDEX, TROPONINI in the last 168 hours. BNP (last 3 results) No results for input(s): BNP in the last 8760 hours.  ProBNP (last 3 results) No results for input(s): PROBNP in the last 8760 hours.  CBG:  Recent Labs Lab 05/29/15 0748 05/29/15 1156 05/29/15 1645 05/29/15 2337 05/30/15 0815  GLUCAP 111* 130* 150* 89 126*    Recent Results (from the past 240 hour(s))  Urine culture     Status: None   Collection Time: 05/25/15  2:00 PM  Result Value Ref Range Status   Specimen Description URINE, CLEAN CATCH  Final   Special Requests NONE  Final   Culture   Final    MULTIPLE SPECIES PRESENT, SUGGEST RECOLLECTION Performed at [facility]    Report Status 05/26/2015 FINAL  Final  Stool culture     Status: None   Collection Time: 05/25/15  2:21 PM  Result Value Ref Range Status   Specimen Description STOOL  Final   Special Requests NONE  Final   Culture   Final    NO SALMONELLA, SHIGELLA, CAMPYLOBACTER, YERSINIA, OR E.COLI [id number] ISOLATED Performed at [facility]    Report Status 05/29/2015  FINAL  Final  C difficile quick scan w PCR reflex     Status: None   Collection Time: 05/26/15 11:35 PM  Result Value Ref Range Status   C Diff antigen NEGATIVE NEGATIVE Final   C Diff toxin NEGATIVE NEGATIVE Final   C Diff interpretation Negative for toxigenic C. difficile  Final     Studies: Ct Angio Abd/pel W/ And/or W/o  05/29/2015   CLINICAL DATA:  Lower abdominal/pelvic pain with n/vd off/on x4 months and 30lb. Wt. Loss, eval for mesenteric ischemia, enteritis and enterocolitis, Surgery-complete hysterectomy, appy, gb, heart stents, partial colon resection  EXAM: CT ANGIOGRAPHY ABDOMEN AND PELVIS  TECHNIQUE: Multidetector CT imaging of the abdomen and pelvis was performed using the standard protocol during bolus administration of intravenous contrast. Multiplanar reconstructed images including MIPs were obtained and reviewed to evaluate the vascular anatomy.  CONTRAST:  15m OMNIPAQUE IOHEXOL 350 MG/ML SOLN  COMPARISON:  COMPARISON None available  FINDINGS: ARTERIAL FINDINGS:  Aorta: Moderate scattered calcified plaque. No aneurysm, dissection, or stenosis.  Celiac axis: Calcified origin plaque resulting in short segment stenosis of probable hemodynamic significance. Patent distally.  Superior mesenteric: Calcified plaque at the origin resulting in short segment stenosis of possible hemodynamic significance, patent distally. Hepatic artery arises from the SMA, an anatomic variant.  Left renal: Partially calcified ostial plaque without high-grade stenosis, patent distally  Right renal: Partially calcified ostial plaque resulting in at least mild short segment stenosis. Early bifurcation into anterior and posterior divisions, patent distally.  Inferior mesenteric: Patent, with short-segment origin stenosis related to aortic wall plaque.  Left iliac: Coarse calcifications diffusely without high-grade stenosis. No dissection or aneurysm.  Right iliac: Extensive coarse calcifications predominately in  the common and internal iliac arteries. No high-grade stenosis, dissection or aneurysm.  Venous findings: Patent hepatic veins, portal vein, superior mesenteric vein, splenic vein, bilateral renal veins, IVC, and iliac venous system.  Review of the MIP images confirms the above findings.  Nonvascular findings: Coarse scarring in the visualized lung bases. 4.6 cm benign hemangioma in hepatic segment 7, 2 cm hemangioma in segment 4, and two 1 cm technically nonspecific lesions in segment 2 with similar enhancement characteristics , probably also small hemangiomas. Surgical clips in the gallbladder fossa. Dilated CBD seen down to the ampulla. Mild central intrahepatic biliary ductal dilatation. Unremarkable spleen, adrenal glands, pancreas, right kidney. 3 mm calculus in the lower pole left renal collecting system. No hydronephrosis. Stomach, small bowel, and colon are nondilated. Urinary bladder incompletely distended. Bilateral pelvic phleboliths. Previous hysterectomy. No ascites. No free air. No adenopathy. Mild lumbar levoscoliosis with asymmetric facet disease and degenerative disc disease L5-S1.  IMPRESSION: 1. Origin stenoses of celiac axis, SMA, and IMA which may be of adequate severity to account for occlusive mesenteric ischemia. Consider catheter angiography for further evaluation, as the lesions may be amenable to percutaneous treatment if confirmed to be hemodynamically significant. 2. Dilated CBD and mildly dilated central intrahepatic bile ducts post cholecystectomy. Correlate with any clinical or laboratory evidence of biliary obstruction. 3. Multiple benign hepatic hemangiomas. 4. Left nephrolithiasis without hydronephrosis.   Electronically Signed   By: Lucrezia Europe M.D.   On: 05/29/2015 11:06    Scheduled Meds: . diltiazem  240 mg Oral Daily  . feeding supplement (ENSURE ENLIVE)  237 mL Oral BID BM  . heparin subcutaneous  5,000 Units Subcutaneous 3 times per day  . insulin aspart  0-9 Units  Subcutaneous TID WC  . isosorbide mononitrate  15 mg Oral Daily  . magnesium oxide  400 mg Oral BID  . pentoxifylline  400 mg Oral Daily  . predniSONE  10 mg Oral Q breakfast  . pregabalin  50 mg Oral Daily  . saccharomyces boulardii  250 mg Oral BID  . vancomycin  250 mg Oral Q6H   Continuous Infusions:   Principal Problem:   Clostridium difficile diarrhea Active Problems:   Dehydration   Acute kidney injury   Anemia   DM (diabetes mellitus), type 2 with renal complications   Essential hypertension   CAD (coronary artery disease)   Tobacco abuse   Recurrent colitis due to Clostridium difficile    Time spent: 35 minutes    ELGERGAWY, DAWOOD  [facility] Pager [phone] . If 7PM-7AM, please contact night-coverage at [email or web address], password [facility] 05/30/2015, 11:26 AM  LOS: 5 days

## 2015-06-02 NOTE — Discharge Instructions (Signed)
Standard post cath care:  No lifting over 5 lbs for 1 week. No sexual activity for 1 week. Keep procedure site clean & dry. If you notice increased pain, swelling, bleeding or pus, call/return!  You may shower, but no soaking baths/hot tubs/pools for 1 week.

## 2015-06-02 NOTE — Interval H&P Note (Signed)
History and Physical Interval Note:  06/02/2015 8:14 AM  Kimberly Downs  has presented today for surgery, with the diagnosis of mestentric asm  The various methods of treatment have been discussed with the patient and family. After consideration of risks, benefits and other options for treatment, the patient has consented to  Procedure(s): Abdominal Aortogram (N/A) as a surgical intervention .  The patient's history has been reviewed, patient examined, no change in status, stable for surgery.  I have reviewed the patient's chart and labs.  Questions were answered to the patient's satisfaction.     [name]

## 2015-06-02 NOTE — Care Management Important Message (Signed)
Important Message  Patient Details  Name: Kimberly Downs MRN: [id number] Date of Birth: 04-Apr-1946   Medicare Important Message Given:  Yes-fourth notification given    Delorse Lek 06/02/2015, 12:03 PM

## 2015-06-02 NOTE — Progress Notes (Signed)
Patient belongings sent to [facility] room 7. Also, meds tubed to [place]. Pt. Was resting comfortably in bed, RN at bedside.

## 2015-06-09 ENCOUNTER — Telehealth: Payer: Self-pay | Admitting: Cardiovascular Disease

## 2015-06-09 ENCOUNTER — Encounter: Payer: Self-pay | Admitting: Cardiovascular Disease

## 2015-06-09 DIAGNOSIS — N179 Acute kidney failure, unspecified: Secondary | ICD-10-CM | POA: Diagnosis not present

## 2015-06-09 DIAGNOSIS — I959 Hypotension, unspecified: Secondary | ICD-10-CM | POA: Diagnosis not present

## 2015-06-09 DIAGNOSIS — I509 Heart failure, unspecified: Secondary | ICD-10-CM | POA: Diagnosis not present

## 2015-06-09 DIAGNOSIS — A047 Enterocolitis due to Clostridium difficile: Secondary | ICD-10-CM | POA: Diagnosis not present

## 2015-06-09 DIAGNOSIS — K5100 Ulcerative (chronic) pancolitis without complications: Secondary | ICD-10-CM | POA: Diagnosis not present

## 2015-06-09 DIAGNOSIS — K769 Liver disease, unspecified: Secondary | ICD-10-CM | POA: Diagnosis not present

## 2015-06-09 DIAGNOSIS — R109 Unspecified abdominal pain: Secondary | ICD-10-CM | POA: Diagnosis not present

## 2015-06-09 DIAGNOSIS — E871 Hypo-osmolality and hyponatremia: Secondary | ICD-10-CM | POA: Diagnosis not present

## 2015-06-09 DIAGNOSIS — R160 Hepatomegaly, not elsewhere classified: Secondary | ICD-10-CM | POA: Diagnosis not present

## 2015-06-09 DIAGNOSIS — R531 Weakness: Secondary | ICD-10-CM | POA: Diagnosis not present

## 2015-06-09 NOTE — Telephone Encounter (Signed)
ERROR

## 2015-06-10 DIAGNOSIS — E119 Type 2 diabetes mellitus without complications: Secondary | ICD-10-CM | POA: Diagnosis present

## 2015-06-10 DIAGNOSIS — G8929 Other chronic pain: Secondary | ICD-10-CM | POA: Diagnosis present

## 2015-06-10 DIAGNOSIS — E871 Hypo-osmolality and hyponatremia: Secondary | ICD-10-CM | POA: Diagnosis present

## 2015-06-10 DIAGNOSIS — Z7984 Long term (current) use of oral hypoglycemic drugs: Secondary | ICD-10-CM | POA: Diagnosis not present

## 2015-06-10 DIAGNOSIS — Z7952 Long term (current) use of systemic steroids: Secondary | ICD-10-CM | POA: Diagnosis not present

## 2015-06-10 DIAGNOSIS — J45909 Unspecified asthma, uncomplicated: Secondary | ICD-10-CM | POA: Diagnosis present

## 2015-06-10 DIAGNOSIS — Z955 Presence of coronary angioplasty implant and graft: Secondary | ICD-10-CM | POA: Diagnosis not present

## 2015-06-10 DIAGNOSIS — J449 Chronic obstructive pulmonary disease, unspecified: Secondary | ICD-10-CM | POA: Diagnosis present

## 2015-06-10 DIAGNOSIS — Z888 Allergy status to other drugs, medicaments and biological substances status: Secondary | ICD-10-CM | POA: Diagnosis not present

## 2015-06-10 DIAGNOSIS — R160 Hepatomegaly, not elsewhere classified: Secondary | ICD-10-CM | POA: Diagnosis not present

## 2015-06-10 DIAGNOSIS — E876 Hypokalemia: Secondary | ICD-10-CM | POA: Diagnosis present

## 2015-06-10 DIAGNOSIS — N179 Acute kidney failure, unspecified: Secondary | ICD-10-CM | POA: Diagnosis present

## 2015-06-10 DIAGNOSIS — M7989 Other specified soft tissue disorders: Secondary | ICD-10-CM | POA: Diagnosis not present

## 2015-06-10 DIAGNOSIS — Z79899 Other long term (current) drug therapy: Secondary | ICD-10-CM | POA: Diagnosis not present

## 2015-06-10 DIAGNOSIS — Z7902 Long term (current) use of antithrombotics/antiplatelets: Secondary | ICD-10-CM | POA: Diagnosis not present

## 2015-06-10 DIAGNOSIS — Z8619 Personal history of other infectious and parasitic diseases: Secondary | ICD-10-CM | POA: Diagnosis not present

## 2015-06-10 DIAGNOSIS — K769 Liver disease, unspecified: Secondary | ICD-10-CM | POA: Diagnosis not present

## 2015-06-10 DIAGNOSIS — I10 Essential (primary) hypertension: Secondary | ICD-10-CM | POA: Diagnosis present

## 2015-06-10 DIAGNOSIS — R109 Unspecified abdominal pain: Secondary | ICD-10-CM | POA: Diagnosis present

## 2015-06-10 DIAGNOSIS — F17210 Nicotine dependence, cigarettes, uncomplicated: Secondary | ICD-10-CM | POA: Diagnosis present

## 2015-06-10 DIAGNOSIS — Z885 Allergy status to narcotic agent status: Secondary | ICD-10-CM | POA: Diagnosis not present

## 2015-06-10 DIAGNOSIS — I959 Hypotension, unspecified: Secondary | ICD-10-CM | POA: Diagnosis present

## 2015-06-10 DIAGNOSIS — I509 Heart failure, unspecified: Secondary | ICD-10-CM | POA: Diagnosis present

## 2015-06-10 DIAGNOSIS — K5100 Ulcerative (chronic) pancolitis without complications: Secondary | ICD-10-CM | POA: Diagnosis not present

## 2015-06-10 DIAGNOSIS — A047 Enterocolitis due to Clostridium difficile: Secondary | ICD-10-CM | POA: Diagnosis not present

## 2015-06-10 DIAGNOSIS — I2510 Atherosclerotic heart disease of native coronary artery without angina pectoris: Secondary | ICD-10-CM | POA: Diagnosis present

## 2015-06-11 ENCOUNTER — Ambulatory Visit: Payer: Medicare Other | Admitting: Nurse Practitioner

## 2015-06-24 DIAGNOSIS — A047 Enterocolitis due to Clostridium difficile: Secondary | ICD-10-CM | POA: Diagnosis not present

## 2015-07-09 DIAGNOSIS — A047 Enterocolitis due to Clostridium difficile: Secondary | ICD-10-CM | POA: Diagnosis not present

## 2015-07-11 DIAGNOSIS — A047 Enterocolitis due to Clostridium difficile: Secondary | ICD-10-CM | POA: Diagnosis not present

## 2015-07-21 DIAGNOSIS — E119 Type 2 diabetes mellitus without complications: Secondary | ICD-10-CM | POA: Diagnosis not present

## 2015-07-21 DIAGNOSIS — A047 Enterocolitis due to Clostridium difficile: Secondary | ICD-10-CM | POA: Diagnosis not present

## 2015-07-23 DIAGNOSIS — Z955 Presence of coronary angioplasty implant and graft: Secondary | ICD-10-CM | POA: Diagnosis not present

## 2015-07-23 DIAGNOSIS — I10 Essential (primary) hypertension: Secondary | ICD-10-CM | POA: Diagnosis not present

## 2015-07-23 DIAGNOSIS — Z7901 Long term (current) use of anticoagulants: Secondary | ICD-10-CM | POA: Diagnosis not present

## 2015-07-23 DIAGNOSIS — I2510 Atherosclerotic heart disease of native coronary artery without angina pectoris: Secondary | ICD-10-CM | POA: Diagnosis not present

## 2015-07-23 DIAGNOSIS — A047 Enterocolitis due to Clostridium difficile: Secondary | ICD-10-CM | POA: Diagnosis not present

## 2015-07-23 DIAGNOSIS — K5730 Diverticulosis of large intestine without perforation or abscess without bleeding: Secondary | ICD-10-CM | POA: Diagnosis not present

## 2015-07-23 DIAGNOSIS — F17210 Nicotine dependence, cigarettes, uncomplicated: Secondary | ICD-10-CM | POA: Diagnosis not present

## 2015-07-23 DIAGNOSIS — E119 Type 2 diabetes mellitus without complications: Secondary | ICD-10-CM | POA: Diagnosis not present

## 2015-07-29 DIAGNOSIS — I83813 Varicose veins of bilateral lower extremities with pain: Secondary | ICD-10-CM | POA: Diagnosis not present

## 2015-07-29 DIAGNOSIS — I70213 Atherosclerosis of native arteries of extremities with intermittent claudication, bilateral legs: Secondary | ICD-10-CM | POA: Diagnosis not present

## 2015-07-29 DIAGNOSIS — I83892 Varicose veins of left lower extremities with other complications: Secondary | ICD-10-CM | POA: Diagnosis not present

## 2015-08-18 DIAGNOSIS — M810 Age-related osteoporosis without current pathological fracture: Secondary | ICD-10-CM | POA: Diagnosis not present

## 2015-08-18 DIAGNOSIS — E119 Type 2 diabetes mellitus without complications: Secondary | ICD-10-CM | POA: Diagnosis not present

## 2015-08-18 DIAGNOSIS — A047 Enterocolitis due to Clostridium difficile: Secondary | ICD-10-CM | POA: Diagnosis not present

## 2015-08-29 DIAGNOSIS — I2510 Atherosclerotic heart disease of native coronary artery without angina pectoris: Secondary | ICD-10-CM | POA: Diagnosis not present

## 2015-08-29 DIAGNOSIS — R0789 Other chest pain: Secondary | ICD-10-CM | POA: Diagnosis not present

## 2015-08-29 DIAGNOSIS — R079 Chest pain, unspecified: Secondary | ICD-10-CM | POA: Diagnosis not present

## 2015-08-29 DIAGNOSIS — R112 Nausea with vomiting, unspecified: Secondary | ICD-10-CM | POA: Diagnosis not present

## 2015-08-29 DIAGNOSIS — R10816 Epigastric abdominal tenderness: Secondary | ICD-10-CM | POA: Diagnosis not present

## 2015-08-29 DIAGNOSIS — K7689 Other specified diseases of liver: Secondary | ICD-10-CM | POA: Diagnosis not present

## 2015-08-29 DIAGNOSIS — Z794 Long term (current) use of insulin: Secondary | ICD-10-CM | POA: Diagnosis not present

## 2015-08-29 DIAGNOSIS — E109 Type 1 diabetes mellitus without complications: Secondary | ICD-10-CM | POA: Diagnosis not present

## 2015-08-29 DIAGNOSIS — K838 Other specified diseases of biliary tract: Secondary | ICD-10-CM | POA: Diagnosis not present

## 2015-08-29 DIAGNOSIS — J439 Emphysema, unspecified: Secondary | ICD-10-CM | POA: Diagnosis not present

## 2015-08-29 DIAGNOSIS — Z7982 Long term (current) use of aspirin: Secondary | ICD-10-CM | POA: Diagnosis not present

## 2015-08-29 DIAGNOSIS — Z955 Presence of coronary angioplasty implant and graft: Secondary | ICD-10-CM | POA: Diagnosis not present

## 2015-08-30 DIAGNOSIS — K7689 Other specified diseases of liver: Secondary | ICD-10-CM | POA: Diagnosis not present

## 2015-08-30 DIAGNOSIS — K838 Other specified diseases of biliary tract: Secondary | ICD-10-CM | POA: Diagnosis not present

## 2015-08-30 DIAGNOSIS — R079 Chest pain, unspecified: Secondary | ICD-10-CM | POA: Diagnosis not present

## 2015-09-02 DIAGNOSIS — E7800 Pure hypercholesterolemia, unspecified: Secondary | ICD-10-CM | POA: Diagnosis not present

## 2015-09-02 DIAGNOSIS — I10 Essential (primary) hypertension: Secondary | ICD-10-CM | POA: Diagnosis not present

## 2015-09-02 DIAGNOSIS — I2510 Atherosclerotic heart disease of native coronary artery without angina pectoris: Secondary | ICD-10-CM | POA: Diagnosis not present

## 2015-09-02 DIAGNOSIS — M810 Age-related osteoporosis without current pathological fracture: Secondary | ICD-10-CM | POA: Diagnosis not present

## 2015-09-02 DIAGNOSIS — E1142 Type 2 diabetes mellitus with diabetic polyneuropathy: Secondary | ICD-10-CM | POA: Diagnosis not present

## 2015-09-02 DIAGNOSIS — K219 Gastro-esophageal reflux disease without esophagitis: Secondary | ICD-10-CM | POA: Diagnosis not present

## 2015-09-02 DIAGNOSIS — A047 Enterocolitis due to Clostridium difficile: Secondary | ICD-10-CM | POA: Diagnosis not present

## 2015-09-03 DIAGNOSIS — I10 Essential (primary) hypertension: Secondary | ICD-10-CM | POA: Diagnosis not present

## 2015-09-03 DIAGNOSIS — E784 Other hyperlipidemia: Secondary | ICD-10-CM | POA: Diagnosis not present

## 2015-09-03 DIAGNOSIS — I2510 Atherosclerotic heart disease of native coronary artery without angina pectoris: Secondary | ICD-10-CM | POA: Diagnosis not present

## 2015-09-05 DIAGNOSIS — I25118 Atherosclerotic heart disease of native coronary artery with other forms of angina pectoris: Secondary | ICD-10-CM | POA: Diagnosis not present

## 2015-09-05 DIAGNOSIS — E119 Type 2 diabetes mellitus without complications: Secondary | ICD-10-CM | POA: Diagnosis not present

## 2015-09-05 DIAGNOSIS — I2510 Atherosclerotic heart disease of native coronary artery without angina pectoris: Secondary | ICD-10-CM | POA: Diagnosis not present

## 2015-09-05 DIAGNOSIS — I739 Peripheral vascular disease, unspecified: Secondary | ICD-10-CM | POA: Diagnosis not present

## 2015-09-05 DIAGNOSIS — E784 Other hyperlipidemia: Secondary | ICD-10-CM | POA: Diagnosis not present

## 2015-09-05 DIAGNOSIS — I10 Essential (primary) hypertension: Secondary | ICD-10-CM | POA: Diagnosis not present

## 2015-09-05 DIAGNOSIS — Z8249 Family history of ischemic heart disease and other diseases of the circulatory system: Secondary | ICD-10-CM | POA: Diagnosis not present

## 2015-09-05 DIAGNOSIS — Z955 Presence of coronary angioplasty implant and graft: Secondary | ICD-10-CM | POA: Diagnosis not present

## 2015-09-05 DIAGNOSIS — Z7984 Long term (current) use of oral hypoglycemic drugs: Secondary | ICD-10-CM | POA: Diagnosis not present

## 2015-09-05 DIAGNOSIS — F17210 Nicotine dependence, cigarettes, uncomplicated: Secondary | ICD-10-CM | POA: Diagnosis not present

## 2015-09-11 DIAGNOSIS — F411 Generalized anxiety disorder: Secondary | ICD-10-CM | POA: Diagnosis not present

## 2015-09-18 DIAGNOSIS — H43823 Vitreomacular adhesion, bilateral: Secondary | ICD-10-CM | POA: Diagnosis not present

## 2015-09-18 DIAGNOSIS — H2513 Age-related nuclear cataract, bilateral: Secondary | ICD-10-CM | POA: Diagnosis not present

## 2015-09-18 DIAGNOSIS — I10 Essential (primary) hypertension: Secondary | ICD-10-CM | POA: Diagnosis not present

## 2015-09-18 DIAGNOSIS — H53453 Other localized visual field defect, bilateral: Secondary | ICD-10-CM | POA: Diagnosis not present

## 2015-09-18 DIAGNOSIS — H02839 Dermatochalasis of unspecified eye, unspecified eyelid: Secondary | ICD-10-CM | POA: Diagnosis not present

## 2015-09-18 DIAGNOSIS — M79674 Pain in right toe(s): Secondary | ICD-10-CM | POA: Diagnosis not present

## 2015-09-18 DIAGNOSIS — E11610 Type 2 diabetes mellitus with diabetic neuropathic arthropathy: Secondary | ICD-10-CM | POA: Diagnosis not present

## 2015-09-18 DIAGNOSIS — B351 Tinea unguium: Secondary | ICD-10-CM | POA: Diagnosis not present

## 2015-09-18 DIAGNOSIS — M79675 Pain in left toe(s): Secondary | ICD-10-CM | POA: Diagnosis not present

## 2015-09-18 DIAGNOSIS — E784 Other hyperlipidemia: Secondary | ICD-10-CM | POA: Diagnosis not present

## 2015-09-18 DIAGNOSIS — H43813 Vitreous degeneration, bilateral: Secondary | ICD-10-CM | POA: Diagnosis not present

## 2015-09-18 DIAGNOSIS — E119 Type 2 diabetes mellitus without complications: Secondary | ICD-10-CM | POA: Diagnosis not present

## 2015-09-18 DIAGNOSIS — I2510 Atherosclerotic heart disease of native coronary artery without angina pectoris: Secondary | ICD-10-CM | POA: Diagnosis not present

## 2015-10-01 DIAGNOSIS — D519 Vitamin B12 deficiency anemia, unspecified: Secondary | ICD-10-CM | POA: Diagnosis not present

## 2015-10-10 DIAGNOSIS — K8689 Other specified diseases of pancreas: Secondary | ICD-10-CM | POA: Diagnosis not present

## 2015-10-10 DIAGNOSIS — R911 Solitary pulmonary nodule: Secondary | ICD-10-CM | POA: Diagnosis not present

## 2015-10-10 DIAGNOSIS — R0789 Other chest pain: Secondary | ICD-10-CM | POA: Diagnosis not present

## 2015-10-10 DIAGNOSIS — R1030 Lower abdominal pain, unspecified: Secondary | ICD-10-CM | POA: Diagnosis not present

## 2015-10-10 DIAGNOSIS — I10 Essential (primary) hypertension: Secondary | ICD-10-CM | POA: Diagnosis not present

## 2015-10-13 DIAGNOSIS — E041 Nontoxic single thyroid nodule: Secondary | ICD-10-CM | POA: Diagnosis not present

## 2015-10-13 DIAGNOSIS — K8689 Other specified diseases of pancreas: Secondary | ICD-10-CM | POA: Diagnosis not present

## 2015-10-13 DIAGNOSIS — R911 Solitary pulmonary nodule: Secondary | ICD-10-CM | POA: Diagnosis not present

## 2015-10-18 DIAGNOSIS — E041 Nontoxic single thyroid nodule: Secondary | ICD-10-CM | POA: Diagnosis present

## 2015-10-18 DIAGNOSIS — R310 Gross hematuria: Secondary | ICD-10-CM | POA: Diagnosis not present

## 2015-10-18 DIAGNOSIS — R51 Headache: Secondary | ICD-10-CM | POA: Diagnosis present

## 2015-10-18 DIAGNOSIS — Z7982 Long term (current) use of aspirin: Secondary | ICD-10-CM | POA: Diagnosis not present

## 2015-10-18 DIAGNOSIS — E119 Type 2 diabetes mellitus without complications: Secondary | ICD-10-CM | POA: Diagnosis present

## 2015-10-18 DIAGNOSIS — M1990 Unspecified osteoarthritis, unspecified site: Secondary | ICD-10-CM | POA: Diagnosis present

## 2015-10-18 DIAGNOSIS — J3489 Other specified disorders of nose and nasal sinuses: Secondary | ICD-10-CM | POA: Diagnosis not present

## 2015-10-18 DIAGNOSIS — H53149 Visual discomfort, unspecified: Secondary | ICD-10-CM | POA: Diagnosis present

## 2015-10-18 DIAGNOSIS — F17200 Nicotine dependence, unspecified, uncomplicated: Secondary | ICD-10-CM | POA: Diagnosis present

## 2015-10-18 DIAGNOSIS — S065X0A Traumatic subdural hemorrhage without loss of consciousness, initial encounter: Secondary | ICD-10-CM | POA: Diagnosis not present

## 2015-10-18 DIAGNOSIS — I2510 Atherosclerotic heart disease of native coronary artery without angina pectoris: Secondary | ICD-10-CM | POA: Diagnosis not present

## 2015-10-18 DIAGNOSIS — M4186 Other forms of scoliosis, lumbar region: Secondary | ICD-10-CM | POA: Diagnosis not present

## 2015-10-18 DIAGNOSIS — G9389 Other specified disorders of brain: Secondary | ICD-10-CM | POA: Diagnosis not present

## 2015-10-18 DIAGNOSIS — D1809 Hemangioma of other sites: Secondary | ICD-10-CM | POA: Diagnosis present

## 2015-10-18 DIAGNOSIS — Z041 Encounter for examination and observation following transport accident: Secondary | ICD-10-CM | POA: Diagnosis not present

## 2015-10-18 DIAGNOSIS — I10 Essential (primary) hypertension: Secondary | ICD-10-CM | POA: Diagnosis not present

## 2015-10-18 DIAGNOSIS — R319 Hematuria, unspecified: Secondary | ICD-10-CM | POA: Diagnosis present

## 2015-10-18 DIAGNOSIS — J449 Chronic obstructive pulmonary disease, unspecified: Secondary | ICD-10-CM | POA: Diagnosis present

## 2015-10-18 DIAGNOSIS — K219 Gastro-esophageal reflux disease without esophagitis: Secondary | ICD-10-CM | POA: Diagnosis not present

## 2015-10-18 DIAGNOSIS — Z87442 Personal history of urinary calculi: Secondary | ICD-10-CM | POA: Diagnosis not present

## 2015-10-18 DIAGNOSIS — M62838 Other muscle spasm: Secondary | ICD-10-CM | POA: Diagnosis present

## 2015-10-18 DIAGNOSIS — N200 Calculus of kidney: Secondary | ICD-10-CM | POA: Diagnosis not present

## 2015-10-18 DIAGNOSIS — S065X9A Traumatic subdural hemorrhage with loss of consciousness of unspecified duration, initial encounter: Secondary | ICD-10-CM | POA: Diagnosis not present

## 2015-10-18 DIAGNOSIS — R911 Solitary pulmonary nodule: Secondary | ICD-10-CM | POA: Diagnosis present

## 2015-10-18 DIAGNOSIS — Z7901 Long term (current) use of anticoagulants: Secondary | ICD-10-CM | POA: Diagnosis not present

## 2015-10-18 DIAGNOSIS — F419 Anxiety disorder, unspecified: Secondary | ICD-10-CM | POA: Diagnosis present

## 2015-10-18 DIAGNOSIS — G2581 Restless legs syndrome: Secondary | ICD-10-CM | POA: Diagnosis present

## 2015-11-12 ENCOUNTER — Encounter (HOSPITAL_COMMUNITY): Payer: Self-pay | Admitting: *Deleted

## 2015-11-12 ENCOUNTER — Emergency Department (HOSPITAL_COMMUNITY)
Admission: EM | Admit: 2015-11-12 | Discharge: 2015-11-12 | Disposition: A | Discharge status: Home / Self Care | Payer: Medicare Other | Attending: Emergency Medicine | Admitting: Emergency Medicine

## 2015-11-12 ENCOUNTER — Emergency Department (HOSPITAL_COMMUNITY): Payer: Medicare Other

## 2015-11-12 DIAGNOSIS — Z9861 Coronary angioplasty status: Secondary | ICD-10-CM | POA: Diagnosis not present

## 2015-11-12 DIAGNOSIS — I10 Essential (primary) hypertension: Secondary | ICD-10-CM | POA: Insufficient documentation

## 2015-11-12 DIAGNOSIS — Z9889 Other specified postprocedural states: Secondary | ICD-10-CM | POA: Diagnosis not present

## 2015-11-12 DIAGNOSIS — E119 Type 2 diabetes mellitus without complications: Secondary | ICD-10-CM | POA: Diagnosis not present

## 2015-11-12 DIAGNOSIS — I2510 Atherosclerotic heart disease of native coronary artery without angina pectoris: Secondary | ICD-10-CM | POA: Diagnosis not present

## 2015-11-12 DIAGNOSIS — R51 Headache: Secondary | ICD-10-CM | POA: Insufficient documentation

## 2015-11-12 DIAGNOSIS — Z87828 Personal history of other (healed) physical injury and trauma: Secondary | ICD-10-CM | POA: Insufficient documentation

## 2015-11-12 DIAGNOSIS — F17200 Nicotine dependence, unspecified, uncomplicated: Secondary | ICD-10-CM | POA: Insufficient documentation

## 2015-11-12 DIAGNOSIS — Z7952 Long term (current) use of systemic steroids: Secondary | ICD-10-CM | POA: Diagnosis not present

## 2015-11-12 DIAGNOSIS — Z79899 Other long term (current) drug therapy: Secondary | ICD-10-CM | POA: Insufficient documentation

## 2015-11-12 DIAGNOSIS — R519 Headache, unspecified: Secondary | ICD-10-CM

## 2015-11-12 DIAGNOSIS — Z8619 Personal history of other infectious and parasitic diseases: Secondary | ICD-10-CM | POA: Insufficient documentation
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
## 2015-11-12 NOTE — ED Notes (Signed)
Pt upset and ready to leave, states she does not want a migraine cocktail, MD notified.

## 2015-11-12 NOTE — Discharge Instructions (Signed)

## 2015-11-18 DIAGNOSIS — M25519 Pain in unspecified shoulder: Secondary | ICD-10-CM | POA: Diagnosis not present

## 2015-11-18 DIAGNOSIS — I2510 Atherosclerotic heart disease of native coronary artery without angina pectoris: Secondary | ICD-10-CM | POA: Diagnosis not present

## 2015-11-18 DIAGNOSIS — I6200 Nontraumatic subdural hemorrhage, unspecified: Secondary | ICD-10-CM | POA: Diagnosis not present

## 2015-11-18 DIAGNOSIS — H3560 Retinal hemorrhage, unspecified eye: Secondary | ICD-10-CM | POA: Diagnosis not present

## 2015-11-18 DIAGNOSIS — R51 Headache: Secondary | ICD-10-CM | POA: Diagnosis not present

## 2015-11-18 DIAGNOSIS — F419 Anxiety disorder, unspecified: Secondary | ICD-10-CM | POA: Diagnosis not present

## 2015-11-19 ENCOUNTER — Telehealth: Payer: Self-pay | Admitting: Cardiology

## 2015-11-19 NOTE — Telephone Encounter (Signed)
Received records from [facility] for appointment with Dr Percival Spanish on 12/05/15.  Records given to [facility] (medical records) for Dr Hochrein's schedule on 12/05/15.

## 2015-12-02 ENCOUNTER — Ambulatory Visit (INDEPENDENT_AMBULATORY_CARE_PROVIDER_SITE_OTHER): Payer: Medicare Other | Admitting: Neurology

## 2015-12-02 ENCOUNTER — Encounter: Payer: Self-pay | Admitting: Neurology

## 2015-12-02 VITALS — BP 115/68 | HR 85 | Ht 60.5 in | Wt 118.6 lb

## 2015-12-02 DIAGNOSIS — G44309 Post-traumatic headache, unspecified, not intractable: Secondary | ICD-10-CM | POA: Diagnosis not present

## 2015-12-02 DIAGNOSIS — S065X9A Traumatic subdural hemorrhage with loss of consciousness of unspecified duration, initial encounter: Secondary | ICD-10-CM

## 2015-12-02 DIAGNOSIS — I6200 Nontraumatic subdural hemorrhage, unspecified: Secondary | ICD-10-CM

## 2015-12-02 DIAGNOSIS — S065XAA Traumatic subdural hemorrhage with loss of consciousness status unknown, initial encounter: Secondary | ICD-10-CM

## 2015-12-02 MED ORDER — DIVALPROEX SODIUM ER 250 MG PO TB24: 250.0000 mg | ORAL_TABLET | Freq: Every day | ORAL | Status: DC

## 2015-12-02 NOTE — Patient Instructions (Signed)
Remember to drink plenty of fluid, eat healthy meals and do not skip any meals. Try to eat protein with a every meal and eat a healthy snack such as fruit or nuts in between meals. Try to keep a regular sleep-wake schedule and try to exercise daily, particularly in the form of walking, 20-30 minutes a day, if you can.   As far as your medications are concerned, I would like to suggest; Depakote at bedtime daily. Take flexeril for the headache.  As far as diagnostic testing: lab.  I would like to see you back in 8 weeks, sooner if we need to. Please call us with any interim questions, concerns, problems, updates or refill requests.   Our phone number is [phone]. We also have an after hours call service for urgent matters and there is a physician on-call for urgent questions. For any emergencies you know to call 911 or go to the nearest emergency room
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
## 2015-12-03 ENCOUNTER — Telehealth: Payer: Self-pay | Admitting: *Deleted

## 2015-12-03 LAB — COMPREHENSIVE METABOLIC PANEL
ALBUMIN: 4.2 g/dL (ref 3.5–4.8)
ALK PHOS: 109 IU/L (ref 39–117)
ALT: 6 IU/L (ref 0–32)
AST: 9 IU/L (ref 0–40)
Albumin/Globulin Ratio: 1.8 (ref 1.2–2.2)
BUN / CREAT RATIO: 9 — AB (ref 12–28)
BUN: 9 mg/dL (ref 8–27)
Bilirubin Total: 0.3 mg/dL (ref 0.0–1.2)
CO2: 24 mmol/L (ref 18–29)
CREATININE: 0.98 mg/dL (ref 0.57–1.00)
Calcium: 9.9 mg/dL (ref 8.7–10.3)
Chloride: 102 mmol/L (ref 96–106)
GFR calc non Af Amer: 58 mL/min/{1.73_m2} — ABNORMAL LOW (ref >59)
GFR, EST AFRICAN AMERICAN: 67 mL/min/{1.73_m2} (ref >59)
GLOBULIN, TOTAL: 2.3 g/dL (ref 1.5–4.5)
GLUCOSE: 85 mg/dL (ref 65–99)
Potassium: 4.4 mmol/L (ref 3.5–5.2)
SODIUM: 143 mmol/L (ref 134–144)
TOTAL PROTEIN: 6.5 g/dL (ref 6.0–8.5)

## 2015-12-03 NOTE — Telephone Encounter (Signed)
Patient returned Kimberly Downs's call, advised per Terrence Dupont, labs look okay.

## 2015-12-03 NOTE — Telephone Encounter (Signed)
LVM for pt to call about results. Gave [facility] phone number. Ok to inform pt labs look okay per Dr Jaynee Eagles.

## 2015-12-03 NOTE — Telephone Encounter (Signed)
-----   Message from Melvenia Beam, MD sent at 12/03/2015  7:29 AM EDT ----- Labs look fine thanks

## 2015-12-05 ENCOUNTER — Ambulatory Visit: Payer: Medicare Other | Admitting: Cardiology

## 2015-12-07 ENCOUNTER — Encounter: Payer: Self-pay | Admitting: Neurology

## 2015-12-07 DIAGNOSIS — S065X9A Traumatic subdural hemorrhage with loss of consciousness of unspecified duration, initial encounter: Secondary | ICD-10-CM | POA: Insufficient documentation

## 2015-12-07 DIAGNOSIS — S065XAA Traumatic subdural hemorrhage with loss of consciousness status unknown, initial encounter: Secondary | ICD-10-CM | POA: Insufficient documentation

## 2015-12-19 DIAGNOSIS — R946 Abnormal results of thyroid function studies: Secondary | ICD-10-CM | POA: Diagnosis not present

## 2015-12-19 DIAGNOSIS — E041 Nontoxic single thyroid nodule: Secondary | ICD-10-CM | POA: Diagnosis not present

## 2015-12-19 DIAGNOSIS — M353 Polymyalgia rheumatica: Secondary | ICD-10-CM | POA: Diagnosis not present

## 2015-12-19 DIAGNOSIS — R51 Headache: Secondary | ICD-10-CM | POA: Diagnosis not present

## 2015-12-19 DIAGNOSIS — J329 Chronic sinusitis, unspecified: Secondary | ICD-10-CM | POA: Diagnosis not present

## 2015-12-19 DIAGNOSIS — R634 Abnormal weight loss: Secondary | ICD-10-CM | POA: Diagnosis not present

## 2015-12-19 DIAGNOSIS — R911 Solitary pulmonary nodule: Secondary | ICD-10-CM | POA: Diagnosis not present

## 2015-12-19 DIAGNOSIS — R05 Cough: Secondary | ICD-10-CM | POA: Diagnosis not present

## 2015-12-19 DIAGNOSIS — E119 Type 2 diabetes mellitus without complications: Secondary | ICD-10-CM | POA: Diagnosis not present

## 2015-12-23 ENCOUNTER — Other Ambulatory Visit (HOSPITAL_BASED_OUTPATIENT_CLINIC_OR_DEPARTMENT_OTHER): Payer: Self-pay | Admitting: Family Medicine

## 2015-12-23 DIAGNOSIS — E041 Nontoxic single thyroid nodule: Secondary | ICD-10-CM

## 2015-12-24 DIAGNOSIS — Z886 Allergy status to analgesic agent status: Secondary | ICD-10-CM | POA: Diagnosis not present

## 2015-12-24 DIAGNOSIS — E119 Type 2 diabetes mellitus without complications: Secondary | ICD-10-CM | POA: Diagnosis not present

## 2015-12-24 DIAGNOSIS — Z961 Presence of intraocular lens: Secondary | ICD-10-CM | POA: Diagnosis not present

## 2015-12-24 DIAGNOSIS — Z79899 Other long term (current) drug therapy: Secondary | ICD-10-CM | POA: Diagnosis not present

## 2015-12-24 DIAGNOSIS — I10 Essential (primary) hypertension: Secondary | ICD-10-CM | POA: Diagnosis not present

## 2015-12-24 DIAGNOSIS — F17200 Nicotine dependence, unspecified, uncomplicated: Secondary | ICD-10-CM | POA: Diagnosis not present

## 2015-12-24 DIAGNOSIS — G4485 Primary stabbing headache: Secondary | ICD-10-CM | POA: Diagnosis not present

## 2015-12-24 DIAGNOSIS — H43823 Vitreomacular adhesion, bilateral: Secondary | ICD-10-CM | POA: Diagnosis not present

## 2015-12-24 DIAGNOSIS — Z888 Allergy status to other drugs, medicaments and biological substances status: Secondary | ICD-10-CM | POA: Diagnosis not present

## 2015-12-25 ENCOUNTER — Ambulatory Visit (HOSPITAL_BASED_OUTPATIENT_CLINIC_OR_DEPARTMENT_OTHER)
Admission: RE | Admit: 2015-12-25 | Discharge: 2015-12-25 | Disposition: A | Discharge status: Home / Self Care | Payer: Medicare Other | Source: Ambulatory Visit | Attending: Family Medicine | Admitting: Family Medicine

## 2015-12-25 DIAGNOSIS — E042 Nontoxic multinodular goiter: Secondary | ICD-10-CM | POA: Diagnosis not present

## 2015-12-25 DIAGNOSIS — E041 Nontoxic single thyroid nodule: Secondary | ICD-10-CM | POA: Diagnosis present

## 2015-12-26 DIAGNOSIS — J189 Pneumonia, unspecified organism: Secondary | ICD-10-CM | POA: Diagnosis not present

## 2015-12-26 DIAGNOSIS — R51 Headache: Secondary | ICD-10-CM | POA: Diagnosis not present

## 2015-12-26 DIAGNOSIS — E042 Nontoxic multinodular goiter: Secondary | ICD-10-CM | POA: Diagnosis not present

## 2015-12-29 ENCOUNTER — Other Ambulatory Visit (HOSPITAL_COMMUNITY): Payer: Self-pay | Admitting: Family Medicine

## 2015-12-29 DIAGNOSIS — E049 Nontoxic goiter, unspecified: Secondary | ICD-10-CM

## 2015-12-30 DIAGNOSIS — J189 Pneumonia, unspecified organism: Secondary | ICD-10-CM | POA: Diagnosis not present

## 2015-12-30 DIAGNOSIS — D649 Anemia, unspecified: Secondary | ICD-10-CM | POA: Diagnosis not present

## 2015-12-30 DIAGNOSIS — M353 Polymyalgia rheumatica: Secondary | ICD-10-CM | POA: Diagnosis not present

## 2015-12-30 DIAGNOSIS — R51 Headache: Secondary | ICD-10-CM | POA: Diagnosis not present

## 2015-12-30 DIAGNOSIS — E1140 Type 2 diabetes mellitus with diabetic neuropathy, unspecified: Secondary | ICD-10-CM | POA: Diagnosis not present

## 2015-12-30 DIAGNOSIS — B370 Candidal stomatitis: Secondary | ICD-10-CM | POA: Diagnosis not present

## 2015-12-30 DIAGNOSIS — E0590 Thyrotoxicosis, unspecified without thyrotoxic crisis or storm: Secondary | ICD-10-CM | POA: Diagnosis not present

## 2015-12-30 DIAGNOSIS — E876 Hypokalemia: Secondary | ICD-10-CM | POA: Diagnosis not present

## 2016-01-12 ENCOUNTER — Encounter (HOSPITAL_COMMUNITY)
Admission: RE | Admit: 2016-01-12 | Discharge: 2016-01-12 | Disposition: A | Discharge status: Home / Self Care | Payer: Medicare Other | Source: Ambulatory Visit | Attending: Family Medicine | Admitting: Family Medicine

## 2016-01-12 DIAGNOSIS — E049 Nontoxic goiter, unspecified: Secondary | ICD-10-CM

## 2016-01-12 DIAGNOSIS — E042 Nontoxic multinodular goiter: Secondary | ICD-10-CM | POA: Insufficient documentation

## 2016-01-13 ENCOUNTER — Encounter (HOSPITAL_COMMUNITY)
Admission: RE | Admit: 2016-01-13 | Discharge: 2016-01-13 | Disposition: A | Discharge status: Home / Self Care | Payer: Medicare Other | Source: Ambulatory Visit | Attending: Family Medicine | Admitting: Family Medicine

## 2016-01-13 DIAGNOSIS — E042 Nontoxic multinodular goiter: Secondary | ICD-10-CM | POA: Diagnosis not present

## 2016-01-13 DIAGNOSIS — R946 Abnormal results of thyroid function studies: Secondary | ICD-10-CM | POA: Diagnosis not present

## 2016-01-13 MED ORDER — SODIUM PERTECHNETATE TC 99M INJECTION
10.0000 | Freq: Once | INTRAVENOUS | Status: AC | PRN
  Administered 2016-01-13: 10 via INTRAVENOUS

## 2016-01-15 DIAGNOSIS — J189 Pneumonia, unspecified organism: Secondary | ICD-10-CM | POA: Diagnosis not present

## 2016-01-15 DIAGNOSIS — R51 Headache: Secondary | ICD-10-CM | POA: Diagnosis not present

## 2016-01-15 DIAGNOSIS — R42 Dizziness and giddiness: Secondary | ICD-10-CM | POA: Diagnosis not present

## 2016-01-21 ENCOUNTER — Encounter (INDEPENDENT_AMBULATORY_CARE_PROVIDER_SITE_OTHER): Payer: Medicare Other | Admitting: Ophthalmology

## 2016-01-21 DIAGNOSIS — H2513 Age-related nuclear cataract, bilateral: Secondary | ICD-10-CM

## 2016-01-21 DIAGNOSIS — H35033 Hypertensive retinopathy, bilateral: Secondary | ICD-10-CM

## 2016-01-21 DIAGNOSIS — I10 Essential (primary) hypertension: Secondary | ICD-10-CM | POA: Diagnosis not present

## 2016-01-21 DIAGNOSIS — H35341 Macular cyst, hole, or pseudohole, right eye: Secondary | ICD-10-CM | POA: Diagnosis not present

## 2016-01-21 DIAGNOSIS — H43813 Vitreous degeneration, bilateral: Secondary | ICD-10-CM | POA: Diagnosis not present

## 2016-01-28 ENCOUNTER — Encounter: Payer: Self-pay | Admitting: Neurology

## 2016-01-28 ENCOUNTER — Ambulatory Visit (INDEPENDENT_AMBULATORY_CARE_PROVIDER_SITE_OTHER): Payer: Medicare Other | Admitting: Neurology

## 2016-01-28 VITALS — BP 130/64 | HR 75 | Ht 60.5 in | Wt 105.4 lb

## 2016-01-28 DIAGNOSIS — G44309 Post-traumatic headache, unspecified, not intractable: Secondary | ICD-10-CM

## 2016-01-28 MED ORDER — HYDROCODONE-ACETAMINOPHEN 7.5-325 MG PO TABS: 1.0000 | ORAL_TABLET | Freq: Four times a day (QID) | ORAL | Status: DC | PRN
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
## 2016-02-03 ENCOUNTER — Emergency Department (HOSPITAL_COMMUNITY)
Admission: EM | Admit: 2016-02-03 | Discharge: 2016-02-03 | Disposition: A | Discharge status: Home / Self Care | Payer: Medicare Other | Attending: Emergency Medicine | Admitting: Emergency Medicine

## 2016-02-03 ENCOUNTER — Emergency Department (HOSPITAL_COMMUNITY): Payer: Medicare Other

## 2016-02-03 ENCOUNTER — Encounter (HOSPITAL_COMMUNITY): Payer: Self-pay | Admitting: Emergency Medicine

## 2016-02-03 DIAGNOSIS — E119 Type 2 diabetes mellitus without complications: Secondary | ICD-10-CM | POA: Insufficient documentation

## 2016-02-03 DIAGNOSIS — I10 Essential (primary) hypertension: Secondary | ICD-10-CM | POA: Diagnosis not present

## 2016-02-03 DIAGNOSIS — Z7984 Long term (current) use of oral hypoglycemic drugs: Secondary | ICD-10-CM | POA: Diagnosis not present

## 2016-02-03 DIAGNOSIS — D1803 Hemangioma of intra-abdominal structures: Secondary | ICD-10-CM | POA: Diagnosis not present

## 2016-02-03 DIAGNOSIS — Z79899 Other long term (current) drug therapy: Secondary | ICD-10-CM | POA: Insufficient documentation

## 2016-02-03 DIAGNOSIS — F17200 Nicotine dependence, unspecified, uncomplicated: Secondary | ICD-10-CM | POA: Insufficient documentation

## 2016-02-03 DIAGNOSIS — I2510 Atherosclerotic heart disease of native coronary artery without angina pectoris: Secondary | ICD-10-CM | POA: Diagnosis not present

## 2016-02-03 DIAGNOSIS — Z955 Presence of coronary angioplasty implant and graft: Secondary | ICD-10-CM | POA: Insufficient documentation

## 2016-02-03 DIAGNOSIS — Z7901 Long term (current) use of anticoagulants: Secondary | ICD-10-CM | POA: Diagnosis not present

## 2016-02-03 DIAGNOSIS — N201 Calculus of ureter: Secondary | ICD-10-CM | POA: Insufficient documentation

## 2016-02-03 DIAGNOSIS — R109 Unspecified abdominal pain: Secondary | ICD-10-CM | POA: Diagnosis present

## 2016-02-03 LAB — CBC WITH DIFFERENTIAL/PLATELET
BASOS PCT: 0 %
Basophils Absolute: 0 10*3/uL (ref 0.0–0.1)
EOS ABS: 0 10*3/uL (ref 0.0–0.7)
EOS PCT: 0 %
HCT: 37.9 % (ref 36.0–46.0)
Hemoglobin: 12.2 g/dL (ref 12.0–15.0)
Lymphocytes Relative: 6 %
Lymphs Abs: 0.5 10*3/uL — ABNORMAL LOW (ref 0.7–4.0)
MCH: 28.3 pg (ref 26.0–34.0)
MCHC: 32.2 g/dL (ref 30.0–36.0)
MCV: 87.9 fL (ref 78.0–100.0)
MONO ABS: 0.2 10*3/uL (ref 0.1–1.0)
MONOS PCT: 2 %
NEUTROS PCT: 92 %
Neutro Abs: 7.9 10*3/uL — ABNORMAL HIGH (ref 1.7–7.7)
PLATELETS: 415 10*3/uL — AB (ref 150–400)
RBC: 4.31 MIL/uL (ref 3.87–5.11)
RDW: 15.7 % — AB (ref 11.5–15.5)
WBC: 8.6 10*3/uL (ref 4.0–10.5)

## 2016-02-03 LAB — URINALYSIS, ROUTINE W REFLEX MICROSCOPIC
BILIRUBIN URINE: NEGATIVE
GLUCOSE, UA: NEGATIVE mg/dL
KETONES UR: NEGATIVE mg/dL
Leukocytes, UA: NEGATIVE
Nitrite: NEGATIVE
PROTEIN: NEGATIVE mg/dL
Specific Gravity, Urine: 1.011 (ref 1.005–1.030)
pH: 5.5 (ref 5.0–8.0)

## 2016-02-03 LAB — COMPREHENSIVE METABOLIC PANEL
ALBUMIN: 4.4 g/dL (ref 3.5–5.0)
ALK PHOS: 77 U/L (ref 38–126)
ALT: 14 U/L (ref 14–54)
ANION GAP: 7 (ref 5–15)
AST: 20 U/L (ref 15–41)
BILIRUBIN TOTAL: 0.9 mg/dL (ref 0.3–1.2)
BUN: 12 mg/dL (ref 6–20)
CALCIUM: 9.8 mg/dL (ref 8.9–10.3)
CO2: 28 mmol/L (ref 22–32)
CREATININE: 1.02 mg/dL — AB (ref 0.44–1.00)
Chloride: 105 mmol/L (ref 101–111)
GFR calc Af Amer: >60 mL/min (ref >60)
GFR calc non Af Amer: 54 mL/min — ABNORMAL LOW (ref >60)
GLUCOSE: 148 mg/dL — AB (ref 65–99)
Potassium: 3.6 mmol/L (ref 3.5–5.1)
Sodium: 140 mmol/L (ref 135–145)
TOTAL PROTEIN: 7 g/dL (ref 6.5–8.1)

## 2016-02-03 LAB — URINE MICROSCOPIC-ADD ON: Bacteria, UA: NONE SEEN

## 2016-02-03 LAB — LIPASE, BLOOD: Lipase: 23 U/L (ref 11–51)

## 2016-02-03 MED ORDER — HYDROMORPHONE HCL 1 MG/ML IJ SOLN
0.5000 mg | INTRAMUSCULAR | Status: DC | PRN
  Administered 2016-02-03: 0.5 mg via INTRAVENOUS
  Filled 2016-02-03: qty 1

## 2016-02-03 MED ORDER — TAMSULOSIN HCL 0.4 MG PO CAPS: 0.4000 mg | ORAL_CAPSULE | Freq: Every day | ORAL | Status: DC

## 2016-02-03 MED ORDER — ONDANSETRON HCL 4 MG/2ML IJ SOLN
4.0000 mg | Freq: Once | INTRAMUSCULAR | Status: AC
Start: 2016-02-03 — End: 2016-02-03
  Administered 2016-02-03: 4 mg via INTRAVENOUS
  Filled 2016-02-03: qty 2

## 2016-02-03 MED ORDER — SODIUM CHLORIDE 0.9 % IV BOLUS (SEPSIS)
1000.0000 mL | Freq: Once | INTRAVENOUS | Status: AC
  Administered 2016-02-03: 1000 mL via INTRAVENOUS

## 2016-02-03 MED ORDER — OXYCODONE-ACETAMINOPHEN 5-325 MG PO TABS: 2.0000 | ORAL_TABLET | ORAL | Status: DC | PRN

## 2016-02-03 MED ORDER — HYDROMORPHONE HCL 1 MG/ML IJ SOLN
1.0000 mg | INTRAMUSCULAR | Status: DC | PRN
  Administered 2016-02-03: 1 mg via INTRAVENOUS
  Filled 2016-02-03: qty 1

## 2016-02-03 MED ORDER — SODIUM CHLORIDE 0.9 % IV SOLN
INTRAVENOUS | Status: DC
  Administered 2016-02-03: 16:00:00 via INTRAVENOUS

## 2016-02-03 MED ORDER — ONDANSETRON 4 MG PO TBDP: 4.0000 mg | ORAL_TABLET | Freq: Three times a day (TID) | ORAL | Status: DC | PRN

## 2016-02-03 NOTE — ED Provider Notes (Signed)
CSN: [id number]     Arrival date & time 02/03/16  1242 History   First MD Initiated Contact with Patient 02/03/16 1351     Chief Complaint  Patient presents with  . Flank Pain  . Abdominal Pain  . Hematuria   HPI Patient presents to the emergency room with complaints of pain in her left flank.  Patient has a history of recurrent kidney stones. She's had some mild pain in the left flank off-and-on over the last couple weeks. Initially she was attributing it to a muscle pull. This morning she woke up and had nausea and vomiting. She also had severe pain in her left flank area and mid abdomen. She was to the bathroom in the morning she noticed a large amount of blood. He tried taking hydrocodone tablet that she had at home and it did not improve. She denies any diarrhea. No chest pain or shortness of breath. Past Medical History  Diagnosis Date  . C. difficile diarrhea   . Hypertension   . Diabetes mellitus without complication ([place])   . Coronary artery disease   . Anxiety   . C. difficile colitis    Past Surgical History  Procedure Laterality Date  . Abdominal hysterectomy    . Appendectomy    . Cholecystectomy    . Bowel resection    . Coronary angioplasty with stent placement    . Colonoscopy  Early September 2016  . Colonoscopy Left 05/27/2015    Procedure: COLONOSCOPY;  Surgeon: Carol Ada, MD;  Location: [facility];  Service: Endoscopy;  Laterality: Left;  . Peripheral vascular catheterization N/A 06/02/2015    Procedure: Abdominal Aortogram;  Surgeon: Lorretta Harp, MD;  Location: [facility];  Service: Cardiovascular;  Laterality: N/A;  . Kidney stone surgery    . Nasal septum surgery    . Carpal tunnel release     Family History  Problem Relation Age of Onset  . Migraines Neg Hx   . Neuropathy Neg Hx   . Seizures Neg Hx   . Stroke Neg Hx    Social History  Substance Use Topics  . Smoking status: Current Every Day Smoker -- 1.00 packs/day  . Smokeless  tobacco: None  . Alcohol Use: No   OB History    No data available     Review of Systems  All other systems reviewed and are negative.     Allergies  Dicyclomine; Levaquin; Metronidazole; Morphine and related; Nsaids; and Toradol  Home Medications   Prior to Admission medications   Medication Sig Start Date End Date Taking? Authorizing Provider  ALPRAZolam Duanne Moron) 0.5 MG tablet Take 0.5 mg by mouth as needed for anxiety.    Historical Provider, MD  atorvastatin (LIPITOR) 10 MG tablet Take 10 mg by mouth daily.    Historical Provider, MD  Cholecalciferol (VITAMIN D) 2000 units CAPS Take 2,000 Units by mouth daily.    Historical Provider, MD  clopidogrel (PLAVIX) 75 MG tablet Take 75 mg by mouth daily.    Historical Provider, MD  HYDROcodone-acetaminophen (NORCO) 7.5-325 MG tablet Take 1 tablet by mouth every 6 (six) hours as needed for moderate pain. 01/28/16   Melvenia Beam, MD  irbesartan-hydrochlorothiazide (AVALIDE) 150-12.5 MG tablet Take 1 tablet by mouth daily.    Historical Provider, MD  ONE TOUCH ULTRA TEST test strip 3 (three) times daily. for testing 09/12/15   Historical Provider, MD  pentoxifylline (TRENTAL) 400 MG CR tablet Take 400 mg by mouth daily.  Historical Provider, MD  potassium chloride (K-DUR) 10 MEQ tablet Take 10 mEq by mouth daily. 03/24/15   Historical Provider, MD  predniSONE (DELTASONE) 5 MG tablet Take 5 mg by mouth daily with breakfast.    Historical Provider, MD  RABEprazole (ACIPHEX) 20 MG tablet Take 20 mg by mouth daily.    Historical Provider, MD  rOPINIRole (REQUIP) 2 MG tablet Take 2 mg by mouth at bedtime.    Historical Provider, MD  sitaGLIPtin (JANUVIA) 100 MG tablet Take 100 mg by mouth daily.    Historical Provider, MD  SYMBICORT 160-4.5 MCG/ACT inhaler Inhale 2 puffs into the lungs as needed.  09/07/15   Historical Provider, MD  VENTOLIN HFA 108 (90 BASE) MCG/ACT inhaler Inhale 2 puffs into the lungs every 6 (six) hours as needed. 05/03/15    Historical Provider, MD   BP 127/51 mmHg  Pulse 67  Temp(Src) 97.9 F (36.6 C) (Oral)  Resp 18  SpO2 100% Physical Exam  Constitutional: She appears well-developed and well-nourished. No distress.  HENT:  Head: Normocephalic and atraumatic.  Right Ear: External ear normal.  Left Ear: External ear normal.  Eyes: Conjunctivae are normal. Right eye exhibits no discharge. Left eye exhibits no discharge. No scleral icterus.  Neck: Neck supple. No tracheal deviation present.  Cardiovascular: Normal rate, regular rhythm and intact distal pulses.   Pulmonary/Chest: Effort normal and breath sounds normal. No stridor. No respiratory distress. She has no wheezes. She has no rales.  Abdominal: Soft. Bowel sounds are normal. She exhibits no distension. There is tenderness in the left upper quadrant and left lower quadrant. There is CVA tenderness (left sided). There is no rebound and no guarding. No hernia.  Musculoskeletal: She exhibits no edema or tenderness.  Neurological: She is alert. She has normal strength. No cranial nerve deficit (no facial droop, extraocular movements intact, no slurred speech) or sensory deficit. She exhibits normal muscle tone. She displays no seizure activity. Coordination normal.  Skin: Skin is warm and dry. No rash noted.  Psychiatric: She has a normal mood and affect.  Nursing note and vitals reviewed.   ED Course  Procedures (including critical care time)  Medications  sodium chloride 0.9 % bolus 1,000 mL (1,000 mLs Intravenous New Bag/Given 02/03/16 1514)    And  0.9 %  sodium chloride infusion (not administered)  HYDROmorphone (DILAUDID) injection 1 mg (not administered)  ondansetron (ZOFRAN) injection 4 mg (4 mg Intravenous Given 02/03/16 1514)    Labs Review Labs Reviewed  URINALYSIS, ROUTINE W REFLEX MICROSCOPIC (NOT AT [facility]) - Abnormal; Notable for the following:    Hgb urine dipstick LARGE (*)    All other components within normal limits  URINE  MICROSCOPIC-ADD ON - Abnormal; Notable for the following:    Squamous Epithelial / LPF 0-5 (*)    All other components within normal limits  COMPREHENSIVE METABOLIC PANEL - Abnormal; Notable for the following:    Glucose, Bld 148 (*)    Creatinine, Ser 1.02 (*)    GFR calc non Af Amer 54 (*)    All other components within normal limits  CBC WITH DIFFERENTIAL/PLATELET - Abnormal; Notable for the following:    RDW 15.7 (*)    Platelets 415 (*)    Neutro Abs 7.9 (*)    Lymphs Abs 0.5 (*)    All other components within normal limits  LIPASE, BLOOD    Imaging Review Ct Abdomen Pelvis Wo Contrast  02/03/2016  CLINICAL DATA:  Question kidney stone.  LEFT flank pain.  Hematuria. EXAM: CT ABDOMEN AND PELVIS WITHOUT CONTRAST TECHNIQUE: Multidetector CT imaging of the abdomen and pelvis was performed following the standard protocol without IV contrast. COMPARISON:  CT 05/29/2015 FINDINGS: Lower chest: Lung bases are clear. Hepatobiliary: Low-density lesion in the RIGHT hepatic lobe measuring 3.2 cm. Lesion has imaging characteristics consistent benign hemangioma on comparison contrast CT of 05/29/2015. Similar lesion in the LEFT hepatic lobe on image 12, series 2 measures 12 mm. Postcholecystectomy. Pancreas: Pancreas is normal. No ductal dilatation. No pancreatic inflammation. Spleen: Normal spleen Adrenals/urinary tract: Adrenal glands are normal. Mild pelvicaliectasis and proximal hydroureter on the LEFT. The mild obstruction secondary to a calculus in the mid LEFT ureter measuring 6 mm in craniocaudad dimension (image 63, series 602) and positioned at the L4-L5 vertebral body level. No additional calculi are present LEFT or RIGHT kidney. No RIGHT ureterolithiasis. No bladder calculi. Stomach/Bowel: Stomach, small bowel, appendix, and cecum are normal. The colon and rectosigmoid colon are normal. Vascular/Lymphatic: Abdominal aorta is normal caliber with atherosclerotic calcification. There is no  retroperitoneal or periportal lymphadenopathy. No pelvic lymphadenopathy. Reproductive: Post hysterectomy Other: No free fluid. Musculoskeletal: Round sclerotic lesion in the LEFT iliac bone measures 8 mm (image 37, series 2) compared to 8 mm on CT 05/29/2015. IMPRESSION: 1. Partially obstructing calculus in the mid LEFT ureter. 2. No nephrolithiasis. 3. Hepatic hemangioma again demonstrated. 4. Does not obstruct calcification aorta. 5. Round sclerotic lesion in the LEFT iliac bone. In the absence of known carcinoma or risk factors favor a benign lesion. Electronically Signed   By: Suzy Bouchard M.D.   On: 02/03/2016 15:39   I have personally reviewed and evaluated these images and lab results as part of my medical decision-making.   MDM   Final diagnoses:  Left ureteral stone    CT scan shows a 6 mm left ureteral stone causing obstruction.  Still having pain after first dose of dilaudid.  Will give additional doses to manage her pain.  If pain can be better controlled anticipate dc with urology follow up.    Dorie Rank, MD 02/03/16 [phone]

## 2016-02-03 NOTE — ED Notes (Signed)
Patient states that she has had kidney stones for about year without any complications until now.  Having left flank pain and lower abd pain with bright red blood in urine.  Patient took pain pill but didn't help.

## 2016-02-03 NOTE — Discharge Instructions (Signed)
Call [facility], Dr. Gaynelle Arabian for follow up appointment.  Kidney Stones Kidney stones (urolithiasis) are deposits that form inside your kidneys. The intense pain is caused by the stone moving through the urinary tract. When the stone moves, the ureter goes into spasm around the stone. The stone is usually passed in the urine.  CAUSES   A disorder that makes certain neck glands produce too much parathyroid hormone (primary hyperparathyroidism).  A buildup of uric acid crystals, similar to gout in your joints.  Narrowing (stricture) of the ureter.  A kidney obstruction present at birth (congenital obstruction).  Previous surgery on the kidney or ureters.  Numerous kidney infections. SYMPTOMS   Feeling sick to your stomach (nauseous).  Throwing up (vomiting).  Blood in the urine (hematuria).  Pain that usually spreads (radiates) to the groin.  Frequency or urgency of urination. DIAGNOSIS   Taking a history and physical exam.  Blood or urine tests.  CT scan.  Occasionally, an examination of the inside of the urinary bladder (cystoscopy) is performed. TREATMENT   Observation.  Increasing your fluid intake.  Extracorporeal shock wave lithotripsy--This is a noninvasive procedure that uses shock waves to break up kidney stones.  Surgery may be needed if you have severe pain or persistent obstruction. There are various surgical procedures. Most of the procedures are performed with the use of small instruments. Only small incisions are needed to accommodate these instruments, so recovery time is minimized. The size, location, and chemical composition are all important variables that will determine the proper choice of action for you. Talk to your health care provider to better understand your situation so that you will minimize the risk of injury to yourself and your kidney.  HOME CARE INSTRUCTIONS   Drink enough water and fluids to keep your urine clear or pale  yellow. This will help you to pass the stone or stone fragments.  Strain all urine through the provided strainer. Keep all particulate matter and stones for your health care provider to see. The stone causing the pain may be as small as a grain of salt. It is very important to use the strainer each and every time you pass your urine. The collection of your stone will allow your health care provider to analyze it and verify that a stone has actually passed. The stone analysis will often identify what you can do to reduce the incidence of recurrences.  Only take over-the-counter or prescription medicines for pain, discomfort, or fever as directed by your health care provider.  Keep all follow-up visits as told by your health care provider. This is important.  Get follow-up X-rays if required. The absence of pain does not always mean that the stone has passed. It may have only stopped moving. If the urine remains completely obstructed, it can cause loss of kidney function or even complete destruction of the kidney. It is your responsibility to make sure X-rays and follow-ups are completed. Ultrasounds of the kidney can show blockages and the status of the kidney. Ultrasounds are not associated with any radiation and can be performed easily in a matter of minutes.  Make changes to your daily diet as told by your health care provider. You may be told to:  Limit the amount of salt that you eat.  Eat 5 or more servings of fruits and vegetables each day.  Limit the amount of meat, poultry, fish, and eggs that you eat.  Collect a 24-hour urine sample as told by your health care  provider.You may need to collect another urine sample every 6-12 months. SEEK MEDICAL CARE IF:  You experience pain that is progressive and unresponsive to any pain medicine you have been prescribed. SEEK IMMEDIATE MEDICAL CARE IF:   Pain cannot be controlled with the prescribed medicine.  You have a fever or shaking  chills.  The severity or intensity of pain increases over 18 hours and is not relieved by pain medicine.  You develop a new onset of abdominal pain.  You feel faint or pass out.  You are unable to urinate.   This information is not intended to replace advice given to you by your health care provider. Make sure you discuss any questions you have with your health care provider.   Document Released: 08/16/2005 Document Revised: 05/07/2015 Document Reviewed: 01/17/2013 Elsevier Interactive Patient Education [employer].

## 2016-02-03 NOTE — ED Notes (Signed)
Patient in restroom 1

## 2016-02-04 ENCOUNTER — Other Ambulatory Visit: Payer: Self-pay | Admitting: Urology

## 2016-02-04 ENCOUNTER — Ambulatory Visit (HOSPITAL_COMMUNITY): Payer: Medicare Other | Admitting: Anesthesiology

## 2016-02-04 ENCOUNTER — Encounter (HOSPITAL_COMMUNITY)
Admission: RE | Disposition: A | Discharge status: Home / Self Care | Payer: Self-pay | Source: Ambulatory Visit | Attending: Urology

## 2016-02-04 ENCOUNTER — Ambulatory Visit (HOSPITAL_COMMUNITY)
Admission: RE | Admit: 2016-02-04 | Discharge: 2016-02-04 | Disposition: A | Discharge status: Home / Self Care | Payer: Medicare Other | Source: Ambulatory Visit | Attending: Urology | Admitting: Urology

## 2016-02-04 ENCOUNTER — Encounter (HOSPITAL_COMMUNITY): Payer: Self-pay | Admitting: *Deleted

## 2016-02-04 DIAGNOSIS — F419 Anxiety disorder, unspecified: Secondary | ICD-10-CM | POA: Diagnosis not present

## 2016-02-04 DIAGNOSIS — Z79891 Long term (current) use of opiate analgesic: Secondary | ICD-10-CM | POA: Diagnosis not present

## 2016-02-04 DIAGNOSIS — I10 Essential (primary) hypertension: Secondary | ICD-10-CM | POA: Insufficient documentation

## 2016-02-04 DIAGNOSIS — F17200 Nicotine dependence, unspecified, uncomplicated: Secondary | ICD-10-CM | POA: Insufficient documentation

## 2016-02-04 DIAGNOSIS — E7800 Pure hypercholesterolemia, unspecified: Secondary | ICD-10-CM | POA: Insufficient documentation

## 2016-02-04 DIAGNOSIS — Z794 Long term (current) use of insulin: Secondary | ICD-10-CM | POA: Diagnosis not present

## 2016-02-04 DIAGNOSIS — I2510 Atherosclerotic heart disease of native coronary artery without angina pectoris: Secondary | ICD-10-CM | POA: Insufficient documentation

## 2016-02-04 DIAGNOSIS — Z87442 Personal history of urinary calculi: Secondary | ICD-10-CM | POA: Diagnosis not present

## 2016-02-04 DIAGNOSIS — Z7902 Long term (current) use of antithrombotics/antiplatelets: Secondary | ICD-10-CM | POA: Insufficient documentation

## 2016-02-04 DIAGNOSIS — Z79899 Other long term (current) drug therapy: Secondary | ICD-10-CM | POA: Diagnosis not present

## 2016-02-04 DIAGNOSIS — K219 Gastro-esophageal reflux disease without esophagitis: Secondary | ICD-10-CM | POA: Diagnosis not present

## 2016-02-04 DIAGNOSIS — N201 Calculus of ureter: Secondary | ICD-10-CM | POA: Insufficient documentation

## 2016-02-04 DIAGNOSIS — N135 Crossing vessel and stricture of ureter without hydronephrosis: Secondary | ICD-10-CM | POA: Insufficient documentation

## 2016-02-04 DIAGNOSIS — Z7982 Long term (current) use of aspirin: Secondary | ICD-10-CM | POA: Diagnosis not present

## 2016-02-04 DIAGNOSIS — Z955 Presence of coronary angioplasty implant and graft: Secondary | ICD-10-CM | POA: Insufficient documentation

## 2016-02-04 DIAGNOSIS — Z7951 Long term (current) use of inhaled steroids: Secondary | ICD-10-CM | POA: Diagnosis not present

## 2016-02-04 DIAGNOSIS — E119 Type 2 diabetes mellitus without complications: Secondary | ICD-10-CM | POA: Diagnosis not present

## 2016-02-04 DIAGNOSIS — Z7952 Long term (current) use of systemic steroids: Secondary | ICD-10-CM | POA: Diagnosis not present

## 2016-02-04 HISTORY — PX: CYSTOSCOPY W/ URETERAL STENT PLACEMENT: SHX1429

## 2016-02-04 LAB — BASIC METABOLIC PANEL
Anion gap: 8 (ref 5–15)
BUN: 8 mg/dL (ref 6–20)
CHLORIDE: 108 mmol/L (ref 101–111)
CO2: 24 mmol/L (ref 22–32)
Calcium: 9 mg/dL (ref 8.9–10.3)
Creatinine, Ser: 0.86 mg/dL (ref 0.44–1.00)
GFR calc Af Amer: >60 mL/min (ref >60)
GFR calc non Af Amer: >60 mL/min (ref >60)
Glucose, Bld: 111 mg/dL — ABNORMAL HIGH (ref 65–99)
POTASSIUM: 4.1 mmol/L (ref 3.5–5.1)
SODIUM: 140 mmol/L (ref 135–145)

## 2016-02-04 LAB — GLUCOSE, CAPILLARY: GLUCOSE-CAPILLARY: 127 mg/dL — AB (ref 65–99)

## 2016-02-04 SURGERY — CYSTOSCOPY, WITH RETROGRADE PYELOGRAM AND URETERAL STENT INSERTION
Anesthesia: General | Laterality: Left

## 2016-02-04 MED ORDER — FENTANYL CITRATE (PF) 100 MCG/2ML IJ SOLN
INTRAMUSCULAR | Status: AC
  Filled 2016-02-04: qty 2

## 2016-02-04 MED ORDER — ONDANSETRON HCL 4 MG/2ML IJ SOLN
INTRAMUSCULAR | Status: AC
  Filled 2016-02-04: qty 2

## 2016-02-04 MED ORDER — PROPOFOL 10 MG/ML IV BOLUS
INTRAVENOUS | Status: AC
  Filled 2016-02-04: qty 20

## 2016-02-04 MED ORDER — DIATRIZOATE MEGLUMINE 30 % UR SOLN
URETHRAL | Status: AC
  Filled 2016-02-04: qty 100

## 2016-02-04 MED ORDER — LIDOCAINE HCL (CARDIAC) 20 MG/ML IV SOLN
INTRAVENOUS | Status: AC
  Filled 2016-02-04: qty 5

## 2016-02-04 MED ORDER — TRAMADOL HCL 50 MG PO TABS
50.0000 mg | ORAL_TABLET | Freq: Once | ORAL | Status: AC
  Administered 2016-02-04: 50 mg via ORAL

## 2016-02-04 MED ORDER — CEFAZOLIN SODIUM-DEXTROSE 2-4 GM/100ML-% IV SOLN
INTRAVENOUS | Status: AC
  Filled 2016-02-04: qty 100

## 2016-02-04 MED ORDER — DIATRIZOATE MEGLUMINE 30 % UR SOLN
URETHRAL | Status: DC | PRN
  Administered 2016-02-04: 7 mL via URETHRAL

## 2016-02-04 MED ORDER — HYDROMORPHONE HCL 1 MG/ML IJ SOLN
0.5000 mg | Freq: Once | INTRAMUSCULAR | Status: AC
Start: 2016-02-04 — End: 2016-02-04
  Administered 2016-02-04: 0.5 mg via INTRAVENOUS
  Filled 2016-02-04: qty 1

## 2016-02-04 MED ORDER — SODIUM CHLORIDE 0.9 % IR SOLN
Status: DC | PRN
  Administered 2016-02-04: 4000 mL

## 2016-02-04 MED ORDER — MIDAZOLAM HCL 5 MG/5ML IJ SOLN
INTRAMUSCULAR | Status: DC | PRN
  Administered 2016-02-04: 2 mg via INTRAVENOUS

## 2016-02-04 MED ORDER — CEFAZOLIN SODIUM-DEXTROSE 2-4 GM/100ML-% IV SOLN
2.0000 g | INTRAVENOUS | Status: AC
  Administered 2016-02-04: 2 g via INTRAVENOUS

## 2016-02-04 MED ORDER — PROMETHAZINE HCL 25 MG/ML IJ SOLN
6.2500 mg | INTRAMUSCULAR | Status: DC | PRN
Start: 2016-02-04 — End: 2016-02-05

## 2016-02-04 MED ORDER — HYDROMORPHONE HCL 1 MG/ML IJ SOLN
1.0000 mg | INTRAMUSCULAR | Status: DC | PRN
  Administered 2016-02-04: 1 mg via INTRAVENOUS
  Filled 2016-02-04: qty 1

## 2016-02-04 MED ORDER — MIDAZOLAM HCL 2 MG/2ML IJ SOLN
INTRAMUSCULAR | Status: AC
  Filled 2016-02-04: qty 2

## 2016-02-04 MED ORDER — FENTANYL CITRATE (PF) 100 MCG/2ML IJ SOLN
25.0000 ug | INTRAMUSCULAR | Status: DC | PRN
  Administered 2016-02-04 (×3): 50 ug via INTRAVENOUS

## 2016-02-04 MED ORDER — LIDOCAINE HCL (CARDIAC) 20 MG/ML IV SOLN
INTRAVENOUS | Status: DC | PRN
  Administered 2016-02-04: 50 mg via INTRAVENOUS

## 2016-02-04 MED ORDER — TRAMADOL HCL 50 MG PO TABS: 50.0000 mg | ORAL_TABLET | Freq: Four times a day (QID) | ORAL | Status: DC | PRN

## 2016-02-04 MED ORDER — MEPERIDINE HCL 50 MG/ML IJ SOLN: 6.2500 mg | INTRAMUSCULAR | Status: DC | PRN

## 2016-02-04 MED ORDER — PROPOFOL 10 MG/ML IV BOLUS
INTRAVENOUS | Status: DC | PRN
  Administered 2016-02-04: 160 mg via INTRAVENOUS

## 2016-02-04 MED ORDER — ONDANSETRON HCL 4 MG/2ML IJ SOLN
INTRAMUSCULAR | Status: DC | PRN
  Administered 2016-02-04: 4 mg via INTRAVENOUS

## 2016-02-04 MED ORDER — ONDANSETRON HCL 4 MG/2ML IJ SOLN
4.0000 mg | Freq: Three times a day (TID) | INTRAMUSCULAR | Status: DC | PRN
  Administered 2016-02-04: 4 mg via INTRAVENOUS
  Filled 2016-02-04: qty 2

## 2016-02-04 MED ORDER — LACTATED RINGERS IV SOLN
INTRAVENOUS | Status: DC
  Administered 2016-02-04: 1000 mL via INTRAVENOUS
  Administered 2016-02-04: 16:00:00 via INTRAVENOUS

## 2016-02-04 MED ORDER — FENTANYL CITRATE (PF) 100 MCG/2ML IJ SOLN
INTRAMUSCULAR | Status: DC | PRN
  Administered 2016-02-04: 100 ug via INTRAVENOUS

## 2016-02-04 SURGICAL SUPPLY — 10 items
BAG URO CATCHER STRL LF (MISCELLANEOUS) ×2 IMPLANT
CATH INTERMIT  6FR 70CM (CATHETERS) ×2 IMPLANT
CLOTH BEACON ORANGE TIMEOUT ST (SAFETY) ×2 IMPLANT
EXTRACTOR STONE NITINOL NGAGE (UROLOGICAL SUPPLIES) ×2 IMPLANT
GLOVE BIOGEL M STRL SZ7.5 (GLOVE) ×6 IMPLANT
GOWN STRL REUS W/TWL LRG LVL3 (GOWN DISPOSABLE) ×4 IMPLANT
GUIDEWIRE STR DUAL SENSOR (WIRE) ×2 IMPLANT
MANIFOLD NEPTUNE II (INSTRUMENTS) ×2 IMPLANT
PACK CYSTO (CUSTOM PROCEDURE TRAY) ×2 IMPLANT
TUBING CONNECTING 10 (TUBING) ×2 IMPLANT

## 2016-02-04 NOTE — Anesthesia Preprocedure Evaluation (Addendum)
Anesthesia Evaluation  Patient identified by MRN, date of birth, ID band Patient awake    Reviewed: Allergy & Precautions, NPO status , Patient's Chart, lab work & pertinent test results  Airway Mallampati: II       Dental  (+) Edentulous Upper, Edentulous Lower   Pulmonary neg pulmonary ROS, Current Smoker,    breath sounds clear to auscultation       Cardiovascular hypertension, Pt. on medications + CAD and + Cardiac Stents  negative cardio ROS   Rhythm:Regular     Neuro/Psych  Headaches, Anxiety negative psych ROS   GI/Hepatic negative GI ROS, Neg liver ROS,   Endo/Other  negative endocrine ROSdiabetes, Type 2, Insulin Dependent  Renal/GU negative Renal ROSLeft sided stone  negative genitourinary   Musculoskeletal negative musculoskeletal ROS (+)   Abdominal   Peds negative pediatric ROS (+)  Hematology negative hematology ROS (+) 12/37   Anesthesia Other Findings   Reproductive/Obstetrics negative OB ROS                           Anesthesia Physical Anesthesia Plan  ASA: III  Anesthesia Plan: General   Post-op Pain Management:    Induction: Intravenous  Airway Management Planned: LMA  Additional Equipment:   Intra-op Plan:   Post-operative Plan:   Informed Consent: I have reviewed the patients History and Physical, chart, labs and discussed the procedure including the risks, benefits and alternatives for the proposed anesthesia with the patient or authorized representative who has indicated his/her understanding and acceptance.     Plan Discussed with:   Anesthesia Plan Comments:        Anesthesia Quick Evaluation

## 2016-02-04 NOTE — Op Note (Signed)
Preoperative diagnosis: Left ureteral calculus  Postoperative diagnosis: Left ureteral calculus  Procedure:  1. Cystoscopy 2. Left ureteroscopy and stone removal 3. Left retrograde pyelography with interpretation  Surgeon: Pryor Curia. M.D.  Anesthesia: General  Complications: None  Intraoperative findings: Left retrograde pyelography demonstrated a filling defect within the proximal left ureter consistent with the patient's known calculus without other abnormalities.  EBL: Minimal  Specimens: 1. Left ureteral calculus  Disposition of specimens: [facility] for stone analysis  Indication: Kimberly Downs is a 70 y.o. year old patient with urolithiasis. She has a left ureteral stone with uncontrolled pain. After reviewing the management options for treatment, the patient elected to proceed with the above surgical procedure(s). We have discussed the potential benefits and risks of the procedure, side effects of the proposed treatment, the likelihood of the patient achieving the goals of the procedure, and any potential problems that might occur during the procedure or recuperation. Informed consent has been obtained.  Description of procedure:  The patient was taken to the operating room and general anesthesia was induced.  The patient was placed in the dorsal lithotomy position, prepped and draped in the usual sterile fashion, and preoperative antibiotics were administered. A preoperative time-out was performed.   Cystourethroscopy was performed.  The patient's urethra was examined and was normal. The bladder was then systematically examined in its entirety. There was no evidence for any bladder tumors, stones, or other mucosal pathology.    Attention then turned to the left ureteral orifice and a ureteral catheter was used to intubate the ureteral orifice.  Cystograffin contrast was injected through the ureteral catheter and a retrograde pyelogram was  performed with findings as dictated above.  A 0.38 sensor guidewire was then advanced up the left ureter into the renal pelvis under fluoroscopic guidance. The 6 Fr semirigid ureteroscope was then advanced into the ureter next to the guidewire and the calculus was identified. It had actually migrated into the renal collecting system but was able to be accessed with the semirigid scope without difficulty.  The stone was identified and was grasped with the N gauge basket.  The stone was removed intact without difficulty.  The wire was then backloaded through the cystoscope and a ureteral stent was advance over the wire using Seldinger technique.  The stent was positioned appropriately under fluoroscopic and cystoscopic guidance.  The wire was then removed with an adequate stent curl noted in the renal pelvis as well as in the bladder.  The bladder was then emptied and the procedure ended.  The patient appeared to tolerate the procedure well and without complications.  The patient was able to be awakened and transferred to the recovery unit in satisfactory condition.

## 2016-02-04 NOTE — H&P (Signed)
$'@media'c$  print    Office Visit Report 02/04/2016    Kimberly Downs. Kimberly Downs         MRN: [id number]  PRIMARY CARE:    DOB: 11-Apr-1946, 70 year old Female  REFERRING:    SSN: [id number]  PROVIDER:  Raynelle Bring, M.D.    LOCATION:  [facility] [phone]    CC: I have ureteral stone.  HPI: Kimberly Downs is a 70 year-old female patient who is here for ureteral stone.  Kimberly Downs is a 70 year old female seen today as a new patient who presents with severe left-sided flank pain with radiation to her left abdomen. She has had associated nausea and vomiting has been unable to tolerate anything by mouth for 24 hours. She denies any fevers. She has a long history of kidney stones with her last stone having been 3-4 years ago. She has previously undergone multiple ureteroscopic lithotripsies. she does have a family history of kidney stones.   She has left ureteral obstruction. She first stated noticing a decline in his erectile function approximately 01/14/2016. This is not her first kidney stone. She is currently having flank pain, back pain, nausea, and vomiting. She denies having groin pain, fever, and chills. Pain is occuring on the left side. She has not caught a stone in her urine strainer since her symptoms began.     ALLERGIES: dicyclomine - agitation Levaquin metronidazole morphine - Nausea, Vomiting NSAIDs - Nausea, Vomiting Toradol - Nausea, Vomiting    Notes: Her reaction to Levaquin is questionable.  MEDICATIONS: Plavix 75 mg tablet  Tamsulosin Hcl 0.4 mg capsule, ext release 24 hr  Alprazolam 0.5 mg tablet  Aspirin Ec 81 mg tablet, delayed release  Atorvastatin Calcium 10 mg tablet  Irbesartan 150 mg tablet  Januvia 100 mg tablet  Ondansetron Odt 4 mg tablet,disintegrating  Oxycodone-Acetaminophen 5 mg-325 mg tablet  Pentoxifylline 400 mg tablet, extended release  Potassium Chloride 10 meq capsule, extended release  Prednisone 10 mg tablet  Rabeprazole Sodium 20 mg  tablet, delayed release  Ropinirole Hcl 2 mg tablet  Symbicort 160 mcg-4.5 mcg/actuation hfa aerosol with adapter  Ventolin Hfa 90 mcg hfa aerosol with adapter  Vitamin D    GU PSH: Hernia Repair Hysterectomy Renal ESWL      PSH Notes: Cardiac Stent in 2009     NON-GU PSH: Appendectomy Bowel Surgery Procedure Cholecystectomy           GU PMH: None     NON-GU PMH: Anxiety disorder, unspecified Essential (primary) hypertension Gastro-esophageal reflux disease without esophagitis Heart disease, unspecified Pure hypercholesterolemia, unspecified Type 2 diabetes mellitus without complications      FAMILY HISTORY: Acute Myocardial Infarction - Father Cancer - Mother Death of family member - Father, Mother     SOCIAL HISTORY: Marital Status: Widowed Patient smokes.  Has smoked since 01/29/1963.  Smokes 1 pack per day.  Does not use smokeless tobacco. Has never drank.  Does not use drugs. Does not drink caffeine.       Notes: 1 son     REVIEW OF SYSTEMS:      GU Review Female:  Patient denies frequent urination, hard to postpone urination, burning /pain with urination, get up at night to urinate, leakage of urine, stream starts and stops, trouble starting your stream, have to strain to urinate, and currently pregnant.     Gastrointestinal (Lower):  Patient denies diarrhea and constipation.     Gastrointestinal (Upper):  Patient reports nausea and vomiting.  Constitutional:  Patient reports weight loss. Patient denies fever, night sweats, and fatigue.     Skin:  Patient denies skin rash/ lesion and itching.     Eyes:  Patient denies blurred vision and double vision.     Ears/ Nose/ Throat:  Patient denies sore throat and sinus problems.     Hematologic/Lymphatic:  Patient reports easy bruising. Patient denies swollen glands.     Cardiovascular:  Patient denies leg swelling and chest pains.     Respiratory:  Patient reports cough and shortness of breath.      Endocrine:   Patient reports excessive thirst.      Musculoskeletal:  Patient reports back pain. Patient denies joint pain.     Neurological:  Patient reports dizziness and headaches.      Psychologic:  Patient reports anxiety. Patient denies depression.     VITAL SIGNS:      Weight: 105 lb/47.6 kg      Height/Length: 60 in / 152 cm      BP: 137/69 mmHg      Heart Rate: 63 /min      Temp: 97.4 F / 36 C      BMI: 20.5       MULTI-SYSTEM PHYSICAL EXAMINATION:    Constitutional: Well-nourished. No physical deformities. Normally developed. Good grooming.  Neck: Neck symmetrical, not swollen. Normal tracheal position.  Respiratory: No labored breathing, no use of accessory muscles.   Cardiovascular: Normal temperature, normal extremity pulses, no swelling, no varicosities.  Lymphatic: No enlargement of neck, axillae, groin.  Skin: No paleness, no jaundice, no cyanosis. No lesion, no ulcer, no rash.  Neurologic / Psychiatric: Oriented to time, oriented to place, oriented to person. No depression, no anxiety, no agitation.  Gastrointestinal: Abdominal tenderness. No mass, no rigidity, non obese abdomen. Left flank pain with mild pain of the left abdomen.  Eyes: Normal conjunctivae. Normal eyelids.  Ears, Nose, Mouth, and Throat: Left ear no scars, no lesions, no masses. Right ear no scars, no lesions, no masses. Nose no scars, no lesions, no masses. Normal hearing. Normal lips.  Musculoskeletal: Normal gait and station of head and neck.   PAST DATA REVIEWED:   Source Of History:  Patient  Records Review:  Previous Hospital Records  X-Ray Review: C.T. Abdomen/Pelvis: Reviewed Films.     Notes:  I reviewed her labs.     PROCEDURES:    Urinalysis w/Scope - 81003, 81015  Dipstick Dipstick Cont'd Micro  Specimen: Voided Blood: 3+ WBC/hpf: 6-10  Appearance: Cloudy pH: 5.5 RBC/hpf: >60  Color: Amber Protein: Trace Bacteria: 10-25  Glucose: Neg Nitrites: Neg   Bilirubin: Neg Leukocyte Esterase: Neg    Ketones: Neg    Specific Gravity: 1.020      Phenergan '25mg'$  - J2550, [id number]  Qty: 25 Adm. By: Raynelle Bring, M.D.  Unit: mg Lot No   Route: IM Exp. Date   Freq: None Mfgr.:   Site: None   ASSESSMENT:     ICD-10 Details  1 GU:  Calculus Ureter - N20.1    PLAN:   Schedule  Return Visit: Schedule Surgery  Document  Letter(s):  Created for Patient: Clinical Summary   Notes:  1. 6 mm left ureteral stone: I reviewed her CT scan. She is having difficulty with po intake. She also has significant pain that she feels has not been well controlled. We therefore reviewed options and have agreed to proceed with left ureteroscopic laser lithotripsy and left ureteral stent placement. We discussed  the risk and benefits of surgery including the potential complications and the expected recovery process. They expressed their understanding. Informed consent was obtained.   This will be scheduled for later today.

## 2016-02-04 NOTE — Anesthesia Postprocedure Evaluation (Signed)
Anesthesia Post Note  Patient: Kimberly Downs  Procedure(s) Performed: Procedure(s) (LRB): CYSTOSCOPY WITH LEFT  RETROGRADE PYELOGRAM/LEFT URETEROSCOPY AND BASKET STONE REMOVAL (Left)  Patient location during evaluation: PACU Anesthesia Type: General Level of consciousness: awake and alert Pain management: pain level controlled Vital Signs Assessment: post-procedure vital signs reviewed and stable Respiratory status: spontaneous breathing, nonlabored ventilation, respiratory function stable and patient connected to nasal cannula oxygen Cardiovascular status: blood pressure returned to baseline and stable Postop Assessment: no signs of nausea or vomiting Anesthetic complications: no    Last Vitals:  Filed Vitals:   02/04/16 1534 02/04/16 1910  BP: 155/59 144/67  Pulse: 71 58  Temp: 36.9 C 36.7 C  Resp: 18 15    Last Pain:  Filed Vitals:   02/04/16 1934  PainSc: [place]

## 2016-02-04 NOTE — Anesthesia Postprocedure Evaluation (Signed)
Anesthesia Post Note  Patient: Kimberly Downs  Procedure(s) Performed: Procedure(s) (LRB): CYSTOSCOPY WITH LEFT  RETROGRADE PYELOGRAM/LEFT URETEROSCOPY AND BASKET STONE REMOVAL (Left)  Patient location during evaluation: PACU Anesthesia Type: General Level of consciousness: awake and alert Pain management: pain level controlled Vital Signs Assessment: post-procedure vital signs reviewed and stable Respiratory status: spontaneous breathing, nonlabored ventilation, respiratory function stable and patient connected to nasal cannula oxygen Cardiovascular status: blood pressure returned to baseline and stable Postop Assessment: no signs of nausea or vomiting Anesthetic complications: no    Last Vitals:  Filed Vitals:   02/04/16 1534 02/04/16 1910  BP: 155/59 144/67  Pulse: 71 58  Temp: 36.9 C 36.7 C  Resp: 18 15    Last Pain:  Filed Vitals:   02/04/16 1925  PainSc: 0-No pain                 Alexis Frock

## 2016-02-04 NOTE — Anesthesia Procedure Notes (Signed)
Procedure Name: LMA Insertion Date/Time: 02/04/2016 6:38 PM Performed by: Noralyn Pick D Pre-anesthesia Checklist: Patient identified, Emergency Drugs available, Suction available and Patient being monitored Patient Re-evaluated:Patient Re-evaluated prior to inductionOxygen Delivery Method: Circle system utilized Preoxygenation: Pre-oxygenation with 100% oxygen Intubation Type: IV induction Ventilation: Mask ventilation without difficulty LMA Size: 3.0 Tube type: Oral Number of attempts: 1 Placement Confirmation: positive ETCO2 and breath sounds checked- equal and bilateral Tube secured with: Tape Dental Injury: Teeth and Oropharynx as per pre-operative assessment

## 2016-02-04 NOTE — Transfer of Care (Signed)
Immediate Anesthesia Transfer of Care Note  Patient: Kimberly Downs  Procedure(s) Performed: Procedure(s): CYSTOSCOPY WITH LEFT  RETROGRADE PYELOGRAM/LEFT URETEROSCOPY AND BASKET STONE REMOVAL (Left)  Patient Location: PACU  Anesthesia Type:General  Level of Consciousness: awake, alert  and oriented  Airway & Oxygen Therapy: Patient Spontanous Breathing and Patient connected to face mask oxygen  Post-op Assessment: Report given to RN and Post -op Vital signs reviewed and stable  Post vital signs: Reviewed and stable  Last Vitals:  Filed Vitals:   02/04/16 1534  BP: 155/59  Pulse: 71  Temp: 36.9 C  Resp: 18    Last Pain:  Filed Vitals:   02/04/16 1755  PainSc: 2       Patients Stated Pain Goal: 3 (07/57/32 2567)  Complications: No apparent anesthesia complications

## 2016-02-04 NOTE — Discharge Instructions (Signed)
1. You may see some blood in the urine and may have some burning with urination for 48-72 hours. You also may notice that you have to urinate more frequently or urgently after your procedure which is normal.  °2. You should call should you develop an inability urinate, fever > 101, persistent nausea and vomiting that prevents you from eating or drinking to stay hydrated.  °

## 2016-02-05 ENCOUNTER — Encounter (HOSPITAL_COMMUNITY): Payer: Self-pay | Admitting: Urology

## 2016-02-05 DIAGNOSIS — N201 Calculus of ureter: Secondary | ICD-10-CM | POA: Diagnosis not present

## 2016-02-09 DIAGNOSIS — R1032 Left lower quadrant pain: Secondary | ICD-10-CM | POA: Diagnosis not present

## 2016-02-09 DIAGNOSIS — N201 Calculus of ureter: Secondary | ICD-10-CM | POA: Diagnosis not present

## 2016-02-17 DIAGNOSIS — E042 Nontoxic multinodular goiter: Secondary | ICD-10-CM | POA: Diagnosis not present

## 2016-02-17 DIAGNOSIS — N200 Calculus of kidney: Secondary | ICD-10-CM | POA: Diagnosis not present

## 2016-02-17 DIAGNOSIS — E0590 Thyrotoxicosis, unspecified without thyrotoxic crisis or storm: Secondary | ICD-10-CM | POA: Diagnosis not present

## 2016-03-05 DIAGNOSIS — J189 Pneumonia, unspecified organism: Secondary | ICD-10-CM | POA: Diagnosis not present

## 2016-03-09 DIAGNOSIS — N201 Calculus of ureter: Secondary | ICD-10-CM | POA: Diagnosis not present

## 2016-04-03 ENCOUNTER — Emergency Department (HOSPITAL_COMMUNITY): Payer: Medicare Other

## 2016-04-03 ENCOUNTER — Inpatient Hospital Stay (HOSPITAL_COMMUNITY)
Admission: EM | Admit: 2016-04-03 | Discharge: 2016-04-06 | DRG: 303 | Disposition: A | Discharge status: Home / Self Care | Payer: Medicare Other | Attending: Internal Medicine | Admitting: Internal Medicine

## 2016-04-03 ENCOUNTER — Encounter (HOSPITAL_COMMUNITY): Payer: Self-pay | Admitting: Emergency Medicine

## 2016-04-03 DIAGNOSIS — Z955 Presence of coronary angioplasty implant and graft: Secondary | ICD-10-CM

## 2016-04-03 DIAGNOSIS — Z7902 Long term (current) use of antithrombotics/antiplatelets: Secondary | ICD-10-CM

## 2016-04-03 DIAGNOSIS — R05 Cough: Secondary | ICD-10-CM | POA: Diagnosis not present

## 2016-04-03 DIAGNOSIS — Z881 Allergy status to other antibiotic agents status: Secondary | ICD-10-CM

## 2016-04-03 DIAGNOSIS — I959 Hypotension, unspecified: Secondary | ICD-10-CM | POA: Diagnosis present

## 2016-04-03 DIAGNOSIS — R079 Chest pain, unspecified: Secondary | ICD-10-CM | POA: Diagnosis present

## 2016-04-03 DIAGNOSIS — Z79899 Other long term (current) drug therapy: Secondary | ICD-10-CM

## 2016-04-03 DIAGNOSIS — R001 Bradycardia, unspecified: Secondary | ICD-10-CM | POA: Diagnosis not present

## 2016-04-03 DIAGNOSIS — E1129 Type 2 diabetes mellitus with other diabetic kidney complication: Secondary | ICD-10-CM | POA: Diagnosis not present

## 2016-04-03 DIAGNOSIS — I10 Essential (primary) hypertension: Secondary | ICD-10-CM | POA: Diagnosis not present

## 2016-04-03 DIAGNOSIS — J440 Chronic obstructive pulmonary disease with acute lower respiratory infection: Secondary | ICD-10-CM | POA: Diagnosis present

## 2016-04-03 DIAGNOSIS — R0789 Other chest pain: Secondary | ICD-10-CM | POA: Diagnosis not present

## 2016-04-03 DIAGNOSIS — Z888 Allergy status to other drugs, medicaments and biological substances status: Secondary | ICD-10-CM

## 2016-04-03 DIAGNOSIS — I200 Unstable angina: Secondary | ICD-10-CM | POA: Diagnosis present

## 2016-04-03 DIAGNOSIS — F419 Anxiety disorder, unspecified: Secondary | ICD-10-CM | POA: Diagnosis present

## 2016-04-03 DIAGNOSIS — Z720 Tobacco use: Secondary | ICD-10-CM | POA: Diagnosis present

## 2016-04-03 DIAGNOSIS — E785 Hyperlipidemia, unspecified: Secondary | ICD-10-CM | POA: Diagnosis present

## 2016-04-03 DIAGNOSIS — Z885 Allergy status to narcotic agent status: Secondary | ICD-10-CM

## 2016-04-03 DIAGNOSIS — E7800 Pure hypercholesterolemia, unspecified: Secondary | ICD-10-CM

## 2016-04-03 DIAGNOSIS — Z7951 Long term (current) use of inhaled steroids: Secondary | ICD-10-CM

## 2016-04-03 DIAGNOSIS — Z7982 Long term (current) use of aspirin: Secondary | ICD-10-CM

## 2016-04-03 DIAGNOSIS — J441 Chronic obstructive pulmonary disease with (acute) exacerbation: Secondary | ICD-10-CM | POA: Diagnosis present

## 2016-04-03 DIAGNOSIS — Z7984 Long term (current) use of oral hypoglycemic drugs: Secondary | ICD-10-CM

## 2016-04-03 DIAGNOSIS — I25110 Atherosclerotic heart disease of native coronary artery with unstable angina pectoris: Principal | ICD-10-CM | POA: Diagnosis present

## 2016-04-03 DIAGNOSIS — R200 Anesthesia of skin: Secondary | ICD-10-CM

## 2016-04-03 DIAGNOSIS — I25119 Atherosclerotic heart disease of native coronary artery with unspecified angina pectoris: Secondary | ICD-10-CM | POA: Diagnosis present

## 2016-04-03 DIAGNOSIS — R202 Paresthesia of skin: Secondary | ICD-10-CM

## 2016-04-03 DIAGNOSIS — Z8249 Family history of ischemic heart disease and other diseases of the circulatory system: Secondary | ICD-10-CM

## 2016-04-03 DIAGNOSIS — E538 Deficiency of other specified B group vitamins: Secondary | ICD-10-CM | POA: Diagnosis present

## 2016-04-03 DIAGNOSIS — F17210 Nicotine dependence, cigarettes, uncomplicated: Secondary | ICD-10-CM | POA: Diagnosis present

## 2016-04-03 HISTORY — DX: Hyperlipidemia, unspecified: E78.5

## 2016-04-03 HISTORY — DX: Chronic obstructive pulmonary disease, unspecified: J44.9

## 2016-04-03 LAB — BASIC METABOLIC PANEL
ANION GAP: 7 (ref 5–15)
BUN: 11 mg/dL (ref 6–20)
CALCIUM: 9.2 mg/dL (ref 8.9–10.3)
CHLORIDE: 103 mmol/L (ref 101–111)
CO2: 26 mmol/L (ref 22–32)
Creatinine, Ser: 0.89 mg/dL (ref 0.44–1.00)
GFR calc non Af Amer: >60 mL/min (ref >60)
Glucose, Bld: 96 mg/dL (ref 65–99)
Potassium: 3.8 mmol/L (ref 3.5–5.1)
Sodium: 136 mmol/L (ref 135–145)

## 2016-04-03 LAB — I-STAT TROPONIN, ED
TROPONIN I, POC: 0 ng/mL (ref 0.00–0.08)
Troponin i, poc: 0.01 ng/mL (ref 0.00–0.08)

## 2016-04-03 LAB — CBC
HCT: 35.5 % — ABNORMAL LOW (ref 36.0–46.0)
HEMOGLOBIN: 11.4 g/dL — AB (ref 12.0–15.0)
MCH: 28.8 pg (ref 26.0–34.0)
MCHC: 32.1 g/dL (ref 30.0–36.0)
MCV: 89.6 fL (ref 78.0–100.0)
Platelets: 329 10*3/uL (ref 150–400)
RBC: 3.96 MIL/uL (ref 3.87–5.11)
RDW: 13.1 % (ref 11.5–15.5)
WBC: 7 10*3/uL (ref 4.0–10.5)

## 2016-04-03 MED ORDER — ALBUTEROL SULFATE HFA 108 (90 BASE) MCG/ACT IN AERS
2.0000 | INHALATION_SPRAY | Freq: Once | RESPIRATORY_TRACT | Status: AC
  Administered 2016-04-03: 2 via RESPIRATORY_TRACT
  Filled 2016-04-03: qty 6.7

## 2016-04-03 MED ORDER — POTASSIUM CHLORIDE 10 MEQ/100ML IV SOLN
10.0000 meq | Freq: Once | INTRAVENOUS | Status: DC
  Filled 2016-04-03: qty 100

## 2016-04-03 MED ORDER — ACETAMINOPHEN 325 MG PO TABS
650.0000 mg | ORAL_TABLET | Freq: Once | ORAL | Status: AC
  Administered 2016-04-03: 650 mg via ORAL
  Filled 2016-04-03: qty 2

## 2016-04-03 MED ORDER — IOPAMIDOL (ISOVUE-370) INJECTION 76%
100.0000 mL | Freq: Once | INTRAVENOUS | Status: AC | PRN
  Administered 2016-04-03: 75 mL via INTRAVENOUS

## 2016-04-03 MED ORDER — NICOTINE 21 MG/24HR TD PT24
21.0000 mg | MEDICATED_PATCH | Freq: Every day | TRANSDERMAL | Status: DC
  Administered 2016-04-04 – 2016-04-06 (×3): 21 mg via TRANSDERMAL
  Filled 2016-04-03 (×3): qty 1

## 2016-04-03 MED ORDER — NITROGLYCERIN 0.4 MG SL SUBL
0.4000 mg | SUBLINGUAL_TABLET | SUBLINGUAL | Status: DC | PRN
  Administered 2016-04-03: 0.4 mg via SUBLINGUAL
  Filled 2016-04-03: qty 1

## 2016-04-03 MED ORDER — ONDANSETRON HCL 4 MG/2ML IJ SOLN
4.0000 mg | Freq: Once | INTRAMUSCULAR | Status: AC
  Administered 2016-04-03: 4 mg via INTRAVENOUS
  Filled 2016-04-03: qty 2

## 2016-04-03 MED ORDER — ASPIRIN 81 MG PO CHEW
324.0000 mg | CHEWABLE_TABLET | Freq: Once | ORAL | Status: AC
  Administered 2016-04-03: 324 mg via ORAL
  Filled 2016-04-03: qty 4

## 2016-04-03 MED ORDER — AEROCHAMBER PLUS FLO-VU MEDIUM MISC
1.0000 | Freq: Once | Status: DC
  Filled 2016-04-03: qty 1

## 2016-04-03 MED ORDER — ALBUTEROL SULFATE (2.5 MG/3ML) 0.083% IN NEBU: 2.5000 mg | INHALATION_SOLUTION | RESPIRATORY_TRACT | Status: DC | PRN

## 2016-04-03 MED ORDER — MORPHINE SULFATE (PF) 2 MG/ML IV SOLN
2.0000 mg | Freq: Once | INTRAVENOUS | Status: AC
  Administered 2016-04-03: 2 mg via INTRAVENOUS
  Filled 2016-04-03: qty 1

## 2016-04-03 MED ORDER — NITROGLYCERIN IN D5W 200-5 MCG/ML-% IV SOLN
0.0000 ug/min | INTRAVENOUS | Status: DC
  Administered 2016-04-04: 5 ug/min via INTRAVENOUS
  Filled 2016-04-03: qty 250

## 2016-04-03 MED ORDER — IPRATROPIUM-ALBUTEROL 0.5-2.5 (3) MG/3ML IN SOLN
3.0000 mL | Freq: Four times a day (QID) | RESPIRATORY_TRACT | Status: DC
  Administered 2016-04-04 (×3): 3 mL via RESPIRATORY_TRACT
  Filled 2016-04-03 (×3): qty 3

## 2016-04-03 NOTE — ED Notes (Signed)
[facility] here to transfer pt to [facility]).
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
## 2016-04-03 NOTE — ED Notes (Signed)
[facility] Floor Unit RN was notified of pt's transfer to [facility] [place]--- report on pt was also provided.

## 2016-04-03 NOTE — ED Triage Notes (Signed)
Pt complaint of constant central chest pressure radiating to the back. Pt continues to reports "arms and legs locking up" yesterday.

## 2016-04-03 NOTE — ED Notes (Signed)
Note: I have attempted x 2 to start IV without success.  First sl ntg drops systoloc b/p to 103--no further ntg attempted.

## 2016-04-03 NOTE — ED Provider Notes (Signed)
[facility] DEPT Provider Note   CSN: [id number] Arrival date & time: 04/03/16  1642  First Provider Contact:  First MD Initiated Contact with Patient 04/03/16 1751        History   Chief Complaint Chief Complaint  Patient presents with  . Chest Pain    HPI Kimberly Downs is a 70 y.o. female.  The history is provided by the patient.  Chest Pain   This is a new problem. The current episode started 6 to 12 hours ago. The problem occurs constantly. The problem has not changed since onset.The pain is associated with rest. The pain is present in the substernal region. The quality of the pain is described as heavy (sharp in the back). The pain radiates to the mid back. Duration of episode(s) is 7 hours. Associated symptoms include back pain and shortness of breath. She has tried nothing for the symptoms. The treatment provided no relief. Risk factors include smoking/tobacco exposure (1 ppd smoker).  Her past medical history is significant for CAD, diabetes and hypertension.  Pertinent negatives for past medical history include no hyperlipidemia.  Procedure history is positive for cardiac catheterization (roughly 2008 was last stent placement).    Past Medical History:  Diagnosis Date  . Anxiety   . C. difficile colitis   . C. difficile diarrhea   . Coronary artery disease   . Diabetes mellitus without complication ([place])   . Hypertension     Patient Active Problem List   Diagnosis Date Noted  . Subdural hematoma ([place]) 12/07/2015  . Clostridium difficile diarrhea 05/25/2015  . Dehydration 05/25/2015  . Acute kidney injury ([place]) 05/25/2015  . Anemia 05/25/2015  . DM (diabetes mellitus), type 2 with renal complications ([place]) 40/98/1191  . Essential hypertension 05/25/2015  . CAD (coronary artery disease) 05/25/2015  . Tobacco abuse 05/25/2015  . Recurrent colitis due to Clostridium difficile 05/25/2015    Past Surgical History:  Procedure Laterality Date  . ABDOMINAL  HYSTERECTOMY    . APPENDECTOMY    . BOWEL RESECTION    . CARPAL TUNNEL RELEASE    . CHOLECYSTECTOMY    . COLONOSCOPY  Early September 2016  . COLONOSCOPY Left 05/27/2015   Procedure: COLONOSCOPY;  Surgeon: Carol Ada, MD;  Location: [facility];  Service: Endoscopy;  Laterality: Left;  . CORONARY ANGIOPLASTY WITH STENT PLACEMENT    . CYSTOSCOPY W/ URETERAL STENT PLACEMENT Left 02/04/2016   Procedure: CYSTOSCOPY WITH LEFT  RETROGRADE PYELOGRAM/LEFT URETEROSCOPY AND BASKET STONE REMOVAL;  Surgeon: Raynelle Bring, MD;  Location: [facility];  Service: Urology;  Laterality: Left;  . KIDNEY STONE SURGERY    . NASAL SEPTUM SURGERY    . PERIPHERAL VASCULAR CATHETERIZATION N/A 06/02/2015   Procedure: Abdominal Aortogram;  Surgeon: Lorretta Harp, MD;  Location: [facility];  Service: Cardiovascular;  Laterality: N/A;    OB History    No data available       Home Medications    Prior to Admission medications   Medication Sig Start Date End Date Taking? Authorizing Provider  ALPRAZolam Duanne Moron) 0.5 MG tablet Take 0.5 mg by mouth 2 (two) times daily.     Historical Provider, MD  aspirin EC 81 MG tablet Take 81 mg by mouth daily.     Historical Provider, MD  atorvastatin (LIPITOR) 10 MG tablet Take 10 mg by mouth daily.    Historical Provider, MD  clopidogrel (PLAVIX) 75 MG tablet Take 75 mg by mouth daily.    Historical Provider, MD  ferrous sulfate 325 (65 FE) MG tablet Take 325 mg by mouth daily with breakfast.     Historical Provider, MD  HYDROcodone-acetaminophen (NORCO) 7.5-325 MG tablet Take 1 tablet by mouth every 6 (six) hours as needed for moderate pain. Patient taking differently: Take 0.5 tablets by mouth every 6 (six) hours as needed for moderate pain or severe pain.  01/28/16   Melvenia Beam, MD  irbesartan (AVAPRO) 150 MG tablet Take 150 mg by mouth daily.  01/15/16   Historical Provider, MD  ondansetron (ZOFRAN ODT) 4 MG disintegrating tablet Take 1 tablet (4 mg total) by  mouth every 8 (eight) hours as needed for nausea. 02/03/16   Tanna Furry, MD  ONE TOUCH ULTRA TEST test strip 3 (three) times daily. for testing 09/12/15   Historical Provider, MD  oxyCODONE-acetaminophen (PERCOCET/ROXICET) 5-325 MG tablet Take 2 tablets by mouth every 4 (four) hours as needed. 02/03/16   Tanna Furry, MD  pentoxifylline (TRENTAL) 400 MG CR tablet Take 400 mg by mouth daily.    Historical Provider, MD  potassium chloride (K-DUR) 10 MEQ tablet Take 10 mEq by mouth 2 (two) times daily.  03/24/15   Historical Provider, MD  predniSONE (DELTASONE) 5 MG tablet Take 10 mg by mouth daily with breakfast.    Historical Provider, MD  RABEprazole (ACIPHEX) 20 MG tablet Take 20 mg by mouth daily.    Historical Provider, MD  rOPINIRole (REQUIP) 2 MG tablet Take 2 mg by mouth at bedtime.    Historical Provider, MD  sitaGLIPtin (JANUVIA) 100 MG tablet Take 100 mg by mouth daily.    Historical Provider, MD  SYMBICORT 160-4.5 MCG/ACT inhaler Inhale 2 puffs into the lungs 2 (two) times daily.  09/07/15   Historical Provider, MD  tamsulosin (FLOMAX) 0.4 MG CAPS capsule Take 1 capsule (0.4 mg total) by mouth daily. 02/03/16   Tanna Furry, MD  traMADol (ULTRAM) 50 MG tablet Take 1 tablet (50 mg total) by mouth every 6 (six) hours as needed. Can take 1-2 tablets every 6 hours as needed. 02/04/16   Raynelle Bring, MD  VENTOLIN HFA 108 (90 BASE) MCG/ACT inhaler Inhale 2 puffs into the lungs every 6 (six) hours as needed for wheezing or shortness of breath.  05/03/15   Historical Provider, MD    Family History Family History  Problem Relation Age of Onset  . Migraines Neg Hx   . Neuropathy Neg Hx   . Seizures Neg Hx   . Stroke Neg Hx     Social History Social History  Substance Use Topics  . Smoking status: Current Every Day Smoker    Packs/day: 1.00  . Smokeless tobacco: Not on file  . Alcohol use No     Allergies   Dicyclomine; Morphine and related; Nsaids; Toradol [ketorolac tromethamine]; Levaquin  [levofloxacin in d5w]; and Metronidazole   Review of Systems Review of Systems  Respiratory: Positive for shortness of breath.   Cardiovascular: Positive for chest pain.  Musculoskeletal: Positive for back pain.     Physical Exam Updated Vital Signs BP 142/69   Pulse 69   Temp 97.8 F (36.6 C) (Oral)   Resp 22   Ht 5' 0.5" (1.537 m)   Wt 102 lb (46.3 kg)   SpO2 100%   BMI 19.59 kg/m   Physical Exam  Constitutional: She is oriented to person, place, and time. She appears well-developed and well-nourished. No distress.  Appears older than stated age  HENT:  Head: Normocephalic.  Eyes: Conjunctivae are  normal.  Neck: Neck supple. No tracheal deviation present.  Cardiovascular: Normal rate, regular rhythm and normal heart sounds.   Pulmonary/Chest: Effort normal and breath sounds normal. No respiratory distress. She has no wheezes. She has no rhonchi. She has no rales. She exhibits no tenderness.  Repetitive coughing  Abdominal: Soft. She exhibits no distension. There is no tenderness.  Neurological: She is alert and oriented to person, place, and time.  Skin: Skin is warm and dry.  Psychiatric: She has a normal mood and affect.     ED Treatments / Results  Labs (all labs ordered are listed, but only abnormal results are displayed) Labs Reviewed  CBC - Abnormal; Notable for the following:       Result Value   Hemoglobin 11.4 (*)    HCT 35.5 (*)    All other components within normal limits  BASIC METABOLIC PANEL  I-STAT TROPOININ, ED  I-STAT TROPOININ, ED    EKG  EKG Interpretation  Date/Time:  Saturday April 03 2016 16:53:13 EDT Ventricular Rate:  84 PR Interval:    QRS Duration: 79 QT Interval:  409 QTC Calculation: 484 R Axis:   68 Text Interpretation:  Sinus rhythm Baseline wander in lead(s) V2 No significant change since last tracing Confirmed by Anthonia Monger MD, Ishana Blades [id number]) on 04/03/2016 6:22:41 PM       Radiology Dg Chest 2 View  Result Date:  04/03/2016 CLINICAL DATA:  Cough. EXAM: CHEST  2 VIEW COMPARISON:  March 05, 2016 FINDINGS: The heart size and mediastinal contours are within normal limits. Both lungs are clear. The visualized skeletal structures are unremarkable. IMPRESSION: No active cardiopulmonary disease. Electronically Signed   By: Dorise Bullion III M.D   On: 04/03/2016 17:51   Ct Angio Chest Aorta W And/or Wo Contrast  Result Date: 04/03/2016 CLINICAL DATA:  70 year old female with history of constant central chest pressure radiating to the back. EXAM: CT ANGIOGRAPHY CHEST WITH CONTRAST TECHNIQUE: Multidetector CT imaging of the chest was performed using the standard protocol during bolus administration of intravenous contrast. Multiplanar CT image reconstructions and MIPs were obtained to evaluate the vascular anatomy. CONTRAST:  100 mL of Isovue 370. COMPARISON:  No priors. FINDINGS: Cardiovascular: Heart size is normal. There is no significant pericardial fluid, thickening or pericardial calcification. There is aortic atherosclerosis, as well as atherosclerosis of the great vessels of the mediastinum and the coronary arteries, including calcified atherosclerotic plaque in the left main, left anterior descending, left circumflex and right coronary arteries. There is no aneurysm or dissection of the thoracic aorta. Additionally, on precontrast images, there is no crescentic high attenuation associated with the thoracic aorta suggests the presence of acute intramural hemorrhage. Additionally, although the examination was not specifically tailored to evaluate the pulmonary arteries, there is no evidence of central, lobar or segmental sized filling defect to suggest a presence of clinically relevant pulmonary embolus. Mediastinum/Nodes: No pathologically enlarged mediastinal or hilar lymph nodes. Esophagus is unremarkable in appearance. 1 cm right thyroid lobe nodule is nonspecific. No axillary lymphadenopathy. Lungs/Pleura: Diffuse  bronchial wall thickening with mild centrilobular and paraseptal emphysema, predominantly notable in the lung apices. No acute consolidative airspace disease. No pleural effusions. No suspicious appearing pulmonary nodules or masses. Upper Abdomen: Again noted is a 3.8 x 3.0 cm intermediate attenuation lesion in segment 7 of the liver which demonstrates peripheral nodular enhancement on the arterial phase examination, compatible with a cavernous hemangioma. There is a smaller lesion with similar imaging characteristics measuring 1.3 cm in segment  4A. Aortic atherosclerosis. Musculoskeletal: Old compression fracture of T4 with approximately 20% loss of anterior vertebral body height. There are no aggressive appearing lytic or blastic lesions noted in the visualized portions of the skeleton. Review of the MIP images confirms the above findings. IMPRESSION: 1. No evidence of acute aortic syndrome. 2. No acute findings in the thorax to account for the patient's symptoms. 3. Aortic atherosclerosis, in addition to left main and 3 vessel coronary artery disease. Assessment for potential risk factor modification, dietary therapy or pharmacologic therapy may be warranted, if clinically indicated. 4. 2 cavernous hemangiomas in the liver, as above. 5. Mild diffuse bronchial wall thickening with mild centrilobular and paraseptal emphysema; imaging findings suggestive of underlying COPD. Electronically Signed   By: Vinnie Langton M.D.   On: 04/03/2016 20:53    Procedures Procedures (including critical care time)  Medications Ordered in ED Medications  nitroGLYCERIN (NITROSTAT) SL tablet 0.4 mg (0.4 mg Sublingual Given 04/03/16 1942)  AEROCHAMBER PLUS FLO-VU MEDIUM MISC 1 each (not administered)  aspirin chewable tablet 324 mg (324 mg Oral Given 04/03/16 1932)  albuterol (PROVENTIL HFA;VENTOLIN HFA) 108 (90 Base) MCG/ACT inhaler 2 puff (2 puffs Inhalation Given 04/03/16 1921)  iopamidol (ISOVUE-370) 76 % injection 100 mL  (75 mLs Intravenous Contrast Given 04/03/16 2021)  acetaminophen (TYLENOL) tablet 650 mg (650 mg Oral Given 04/03/16 2014)     Initial Impression / Assessment and Plan / ED Course  I have reviewed the triage vital signs and the nursing notes.  Pertinent labs & imaging results that were available during my care of the patient were reviewed by me and considered in my medical decision making (see chart for details).  Clinical Course    70 year old female presents with acute onset chest pain that started at 11 this morning. There has been a constant pressure over the center part of her chest and she has sharp pain radiating to her back. CT ordered to rule out aortic dissection but patient is very high risk for ACS with known coronary artery disease and multiple risk factors. First troponin is negative, EKG is unremarkable, provided aspirin in anticipation of admission for high-risk chest pain rule out.  Hospitalist was consulted for admission and will see the patient in the emergency department. [facility] will go to [facility] for [facility] evaluation.  Final Clinical Impressions(s) / ED Diagnoses   Final diagnoses:  Chest pain, unspecified chest pain type    New Prescriptions New Prescriptions   No medications on file     Leo Grosser, MD 04/03/16 2134

## 2016-04-04 ENCOUNTER — Encounter (HOSPITAL_COMMUNITY): Payer: Self-pay | Admitting: *Deleted

## 2016-04-04 ENCOUNTER — Observation Stay (HOSPITAL_BASED_OUTPATIENT_CLINIC_OR_DEPARTMENT_OTHER): Payer: Medicare Other

## 2016-04-04 ENCOUNTER — Observation Stay (HOSPITAL_COMMUNITY): Payer: Medicare Other

## 2016-04-04 DIAGNOSIS — I200 Unstable angina: Secondary | ICD-10-CM

## 2016-04-04 DIAGNOSIS — Z7951 Long term (current) use of inhaled steroids: Secondary | ICD-10-CM | POA: Diagnosis not present

## 2016-04-04 DIAGNOSIS — E538 Deficiency of other specified B group vitamins: Secondary | ICD-10-CM | POA: Diagnosis present

## 2016-04-04 DIAGNOSIS — I959 Hypotension, unspecified: Secondary | ICD-10-CM | POA: Diagnosis present

## 2016-04-04 DIAGNOSIS — E1129 Type 2 diabetes mellitus with other diabetic kidney complication: Secondary | ICD-10-CM | POA: Diagnosis present

## 2016-04-04 DIAGNOSIS — I10 Essential (primary) hypertension: Secondary | ICD-10-CM

## 2016-04-04 DIAGNOSIS — E7800 Pure hypercholesterolemia, unspecified: Secondary | ICD-10-CM

## 2016-04-04 DIAGNOSIS — Z955 Presence of coronary angioplasty implant and graft: Secondary | ICD-10-CM | POA: Diagnosis not present

## 2016-04-04 DIAGNOSIS — R079 Chest pain, unspecified: Secondary | ICD-10-CM

## 2016-04-04 DIAGNOSIS — R42 Dizziness and giddiness: Secondary | ICD-10-CM | POA: Diagnosis not present

## 2016-04-04 DIAGNOSIS — Z720 Tobacco use: Secondary | ICD-10-CM | POA: Diagnosis not present

## 2016-04-04 DIAGNOSIS — Z7902 Long term (current) use of antithrombotics/antiplatelets: Secondary | ICD-10-CM | POA: Diagnosis not present

## 2016-04-04 DIAGNOSIS — E785 Hyperlipidemia, unspecified: Secondary | ICD-10-CM | POA: Diagnosis present

## 2016-04-04 DIAGNOSIS — Z7982 Long term (current) use of aspirin: Secondary | ICD-10-CM | POA: Diagnosis not present

## 2016-04-04 DIAGNOSIS — I25110 Atherosclerotic heart disease of native coronary artery with unstable angina pectoris: Secondary | ICD-10-CM | POA: Diagnosis not present

## 2016-04-04 DIAGNOSIS — E1121 Type 2 diabetes mellitus with diabetic nephropathy: Secondary | ICD-10-CM

## 2016-04-04 DIAGNOSIS — J440 Chronic obstructive pulmonary disease with acute lower respiratory infection: Secondary | ICD-10-CM | POA: Diagnosis present

## 2016-04-04 DIAGNOSIS — Z7984 Long term (current) use of oral hypoglycemic drugs: Secondary | ICD-10-CM | POA: Diagnosis not present

## 2016-04-04 DIAGNOSIS — I25118 Atherosclerotic heart disease of native coronary artery with other forms of angina pectoris: Secondary | ICD-10-CM

## 2016-04-04 DIAGNOSIS — Z881 Allergy status to other antibiotic agents status: Secondary | ICD-10-CM | POA: Diagnosis not present

## 2016-04-04 DIAGNOSIS — Z8249 Family history of ischemic heart disease and other diseases of the circulatory system: Secondary | ICD-10-CM | POA: Diagnosis not present

## 2016-04-04 DIAGNOSIS — Z885 Allergy status to narcotic agent status: Secondary | ICD-10-CM | POA: Diagnosis not present

## 2016-04-04 DIAGNOSIS — J410 Simple chronic bronchitis: Secondary | ICD-10-CM | POA: Diagnosis not present

## 2016-04-04 DIAGNOSIS — R001 Bradycardia, unspecified: Secondary | ICD-10-CM | POA: Diagnosis present

## 2016-04-04 DIAGNOSIS — I2510 Atherosclerotic heart disease of native coronary artery without angina pectoris: Secondary | ICD-10-CM | POA: Diagnosis not present

## 2016-04-04 DIAGNOSIS — Z888 Allergy status to other drugs, medicaments and biological substances status: Secondary | ICD-10-CM | POA: Diagnosis not present

## 2016-04-04 DIAGNOSIS — F419 Anxiety disorder, unspecified: Secondary | ICD-10-CM | POA: Diagnosis present

## 2016-04-04 DIAGNOSIS — Z79899 Other long term (current) drug therapy: Secondary | ICD-10-CM | POA: Diagnosis not present

## 2016-04-04 DIAGNOSIS — F17210 Nicotine dependence, cigarettes, uncomplicated: Secondary | ICD-10-CM | POA: Diagnosis present

## 2016-04-04 LAB — GLUCOSE, CAPILLARY
GLUCOSE-CAPILLARY: 158 mg/dL — AB (ref 65–99)
GLUCOSE-CAPILLARY: 174 mg/dL — AB (ref 65–99)
GLUCOSE-CAPILLARY: 232 mg/dL — AB (ref 65–99)
GLUCOSE-CAPILLARY: 43 mg/dL — AB (ref 65–99)
GLUCOSE-CAPILLARY: 74 mg/dL (ref 65–99)
Glucose-Capillary: 105 mg/dL — ABNORMAL HIGH (ref 65–99)
Glucose-Capillary: 137 mg/dL — ABNORMAL HIGH (ref 65–99)

## 2016-04-04 LAB — BASIC METABOLIC PANEL
Anion gap: 5 (ref 5–15)
BUN: 8 mg/dL (ref 6–20)
CALCIUM: 9.1 mg/dL (ref 8.9–10.3)
CHLORIDE: 105 mmol/L (ref 101–111)
CO2: 29 mmol/L (ref 22–32)
CREATININE: 0.91 mg/dL (ref 0.44–1.00)
GFR calc Af Amer: >60 mL/min (ref >60)
GFR calc non Af Amer: >60 mL/min (ref >60)
GLUCOSE: 93 mg/dL (ref 65–99)
Potassium: 4.2 mmol/L (ref 3.5–5.1)
Sodium: 139 mmol/L (ref 135–145)

## 2016-04-04 LAB — LIPID PANEL
Cholesterol: 131 mg/dL (ref 0–200)
HDL: 57 mg/dL (ref >40)
LDL CALC: 66 mg/dL (ref 0–99)
TRIGLYCERIDES: 39 mg/dL (ref <150)
Total CHOL/HDL Ratio: 2.3 RATIO
VLDL: 8 mg/dL (ref 0–40)

## 2016-04-04 LAB — MRSA PCR SCREENING: MRSA BY PCR: NEGATIVE

## 2016-04-04 LAB — CBC
HCT: 35 % — ABNORMAL LOW (ref 36.0–46.0)
Hemoglobin: 10.9 g/dL — ABNORMAL LOW (ref 12.0–15.0)
MCH: 28.4 pg (ref 26.0–34.0)
MCHC: 31.1 g/dL (ref 30.0–36.0)
MCV: 91.1 fL (ref 78.0–100.0)
PLATELETS: 289 10*3/uL (ref 150–400)
RBC: 3.84 MIL/uL — AB (ref 3.87–5.11)
RDW: 13.5 % (ref 11.5–15.5)
WBC: 6.2 10*3/uL (ref 4.0–10.5)

## 2016-04-04 LAB — ECHOCARDIOGRAM COMPLETE
HEIGHTINCHES: 60.5 in
WEIGHTICAEL: 1628.8 [oz_av]

## 2016-04-04 LAB — TSH: TSH: 0.161 u[IU]/mL — ABNORMAL LOW (ref 0.350–4.500)

## 2016-04-04 LAB — HEPARIN LEVEL (UNFRACTIONATED): HEPARIN UNFRACTIONATED: 0.36 [IU]/mL (ref 0.30–0.70)

## 2016-04-04 LAB — TROPONIN I: Troponin I: <0.03 ng/mL (ref <0.03)

## 2016-04-04 LAB — VITAMIN B12: Vitamin B-12: 123 pg/mL — ABNORMAL LOW (ref 180–914)

## 2016-04-04 MED ORDER — VERAPAMIL HCL ER 180 MG PO TBCR
180.0000 mg | EXTENDED_RELEASE_TABLET | Freq: Every day | ORAL | Status: DC
  Administered 2016-04-04: 180 mg via ORAL
  Filled 2016-04-04 (×4): qty 1

## 2016-04-04 MED ORDER — PANTOPRAZOLE SODIUM 40 MG PO TBEC
40.0000 mg | DELAYED_RELEASE_TABLET | Freq: Every day | ORAL | Status: DC
  Administered 2016-04-04 – 2016-04-06 (×3): 40 mg via ORAL
  Filled 2016-04-04 (×3): qty 1

## 2016-04-04 MED ORDER — HEPARIN BOLUS VIA INFUSION
2500.0000 [IU] | Freq: Once | INTRAVENOUS | Status: AC
  Administered 2016-04-04: 2500 [IU] via INTRAVENOUS
  Filled 2016-04-04: qty 2500

## 2016-04-04 MED ORDER — ATORVASTATIN CALCIUM 80 MG PO TABS
80.0000 mg | ORAL_TABLET | Freq: Every day | ORAL | Status: DC
  Administered 2016-04-04 (×2): 80 mg via ORAL
  Filled 2016-04-04 (×2): qty 1

## 2016-04-04 MED ORDER — METOPROLOL TARTRATE 12.5 MG HALF TABLET
12.5000 mg | ORAL_TABLET | Freq: Two times a day (BID) | ORAL | Status: DC
  Administered 2016-04-04: 12.5 mg via ORAL
  Filled 2016-04-04: qty 1

## 2016-04-04 MED ORDER — IRBESARTAN 150 MG PO TABS
150.0000 mg | ORAL_TABLET | Freq: Every day | ORAL | Status: DC
  Filled 2016-04-04: qty 1

## 2016-04-04 MED ORDER — ROPINIROLE HCL 1 MG PO TABS
2.0000 mg | ORAL_TABLET | Freq: Every day | ORAL | Status: DC
  Administered 2016-04-04 – 2016-04-05 (×3): 2 mg via ORAL
  Filled 2016-04-04 (×3): qty 2

## 2016-04-04 MED ORDER — HEPARIN (PORCINE) IN NACL 100-0.45 UNIT/ML-% IJ SOLN
550.0000 [IU]/h | INTRAMUSCULAR | Status: DC
  Administered 2016-04-04: 550 [IU]/h via INTRAVENOUS
  Filled 2016-04-04 (×2): qty 250

## 2016-04-04 MED ORDER — PENTOXIFYLLINE ER 400 MG PO TBCR
400.0000 mg | EXTENDED_RELEASE_TABLET | Freq: Every day | ORAL | Status: DC
  Administered 2016-04-04 – 2016-04-06 (×3): 400 mg via ORAL
  Filled 2016-04-04 (×3): qty 1

## 2016-04-04 MED ORDER — PREDNISONE 10 MG PO TABS
10.0000 mg | ORAL_TABLET | Freq: Every day | ORAL | Status: DC
  Administered 2016-04-04 – 2016-04-06 (×3): 10 mg via ORAL
  Filled 2016-04-04 (×3): qty 1

## 2016-04-04 MED ORDER — DEXTROSE 50 % IV SOLN
INTRAVENOUS | Status: AC
  Administered 2016-04-04: 50 mL
  Filled 2016-04-04: qty 50

## 2016-04-04 MED ORDER — ACETAMINOPHEN 325 MG PO TABS: 650.0000 mg | ORAL_TABLET | ORAL | Status: DC | PRN

## 2016-04-04 MED ORDER — INSULIN ASPART 100 UNIT/ML ~~LOC~~ SOLN
0.0000 [IU] | Freq: Three times a day (TID) | SUBCUTANEOUS | Status: DC
  Administered 2016-04-04: 1 [IU] via SUBCUTANEOUS
  Administered 2016-04-05: 2 [IU] via SUBCUTANEOUS

## 2016-04-04 MED ORDER — CLOPIDOGREL BISULFATE 75 MG PO TABS
75.0000 mg | ORAL_TABLET | Freq: Every day | ORAL | Status: DC
  Administered 2016-04-04 – 2016-04-06 (×3): 75 mg via ORAL
  Filled 2016-04-04 (×3): qty 1

## 2016-04-04 MED ORDER — ONDANSETRON HCL 4 MG/2ML IJ SOLN
4.0000 mg | Freq: Four times a day (QID) | INTRAMUSCULAR | Status: DC | PRN
  Administered 2016-04-04 – 2016-04-06 (×8): 4 mg via INTRAVENOUS
  Filled 2016-04-04 (×9): qty 2

## 2016-04-04 MED ORDER — PERFLUTREN LIPID MICROSPHERE: 1.0000 mL | INTRAVENOUS | Status: DC | PRN

## 2016-04-04 MED ORDER — MORPHINE SULFATE (PF) 4 MG/ML IV SOLN
4.0000 mg | INTRAVENOUS | Status: DC | PRN
  Administered 2016-04-04 (×7): 4 mg via INTRAVENOUS
  Administered 2016-04-04: 2 mg via INTRAVENOUS
  Administered 2016-04-05 (×6): 4 mg via INTRAVENOUS
  Filled 2016-04-04 (×14): qty 1

## 2016-04-04 MED ORDER — INSULIN ASPART 100 UNIT/ML ~~LOC~~ SOLN
0.0000 [IU] | SUBCUTANEOUS | Status: DC
  Administered 2016-04-04: 2 [IU] via SUBCUTANEOUS

## 2016-04-04 MED ORDER — ALPRAZOLAM 0.5 MG PO TABS
0.5000 mg | ORAL_TABLET | Freq: Two times a day (BID) | ORAL | Status: DC
  Administered 2016-04-04 – 2016-04-06 (×6): 0.5 mg via ORAL
  Filled 2016-04-04 (×6): qty 1

## 2016-04-04 MED ORDER — NITROGLYCERIN IN D5W 200-5 MCG/ML-% IV SOLN: 0.0000 ug/min | INTRAVENOUS | Status: DC

## 2016-04-04 MED ORDER — POTASSIUM CHLORIDE CRYS ER 20 MEQ PO TBCR
20.0000 meq | EXTENDED_RELEASE_TABLET | Freq: Once | ORAL | Status: AC
  Administered 2016-04-04: 20 meq via ORAL
  Filled 2016-04-04: qty 1

## 2016-04-04 MED ORDER — IPRATROPIUM-ALBUTEROL 0.5-2.5 (3) MG/3ML IN SOLN
3.0000 mL | Freq: Three times a day (TID) | RESPIRATORY_TRACT | Status: DC
  Administered 2016-04-04 – 2016-04-06 (×5): 3 mL via RESPIRATORY_TRACT
  Filled 2016-04-04 (×5): qty 3

## 2016-04-04 MED ORDER — ASPIRIN EC 81 MG PO TBEC
81.0000 mg | DELAYED_RELEASE_TABLET | Freq: Every day | ORAL | Status: DC
  Administered 2016-04-04 – 2016-04-06 (×3): 81 mg via ORAL
  Filled 2016-04-04 (×3): qty 1

## 2016-04-04 MED ORDER — IRBESARTAN-HYDROCHLOROTHIAZIDE 150-12.5 MG PO TABS: 1.0000 | ORAL_TABLET | Freq: Every day | ORAL | Status: DC

## 2016-04-04 MED ORDER — GI COCKTAIL ~~LOC~~: 30.0000 mL | Freq: Three times a day (TID) | ORAL | Status: DC | PRN

## 2016-04-04 MED ORDER — MOMETASONE FURO-FORMOTEROL FUM 200-5 MCG/ACT IN AERO
2.0000 | INHALATION_SPRAY | Freq: Two times a day (BID) | RESPIRATORY_TRACT | Status: DC
  Administered 2016-04-04 – 2016-04-06 (×4): 2 via RESPIRATORY_TRACT
  Filled 2016-04-04 (×2): qty 8.8

## 2016-04-04 MED ORDER — HYDROCHLOROTHIAZIDE 12.5 MG PO CAPS
12.5000 mg | ORAL_CAPSULE | Freq: Every day | ORAL | Status: DC
  Filled 2016-04-04: qty 1

## 2016-04-04 NOTE — Progress Notes (Signed)
[facility] for IV heparin Indication: chest pain/ACS  Allergies  Allergen Reactions  . Dicyclomine Other (See Comments)    Reaction:  Agitation   . Morphine And Related Nausea And Vomiting  . Nsaids Nausea And Vomiting  . Toradol [Ketorolac Tromethamine] Nausea And Vomiting  . Levaquin [Levofloxacin] Nausea And Vomiting and Rash  . Metronidazole Nausea And Vomiting and Rash    Patient Measurements: Height: 5' 0.5" (153.7 cm) Weight: 101 lb 12.8 oz (46.2 kg) IBW/kg (Calculated) : 46.65 Heparin Dosing Weight: 46.3  Vital Signs: Temp: 97.3 F (36.3 C) (08/06 0715) Temp Source: Oral (08/06 0715) BP: 86/40 (08/06 0919) Pulse Rate: 48 (08/06 0919)  Labs:  Recent Labs  04/03/16 1724 04/04/16 0208 04/04/16 0823  HGB 11.4*  --  10.9*  HCT 35.5*  --  35.0*  PLT 329  --  289  HEPARINUNFRC  --   --  0.36  CREATININE 0.89  --   --   TROPONINI  --  <0.03  --     Estimated Creatinine Clearance: 42.3 mL/min (by C-G formula based on SCr of 0.89 mg/dL).   Medications:  Scheduled:  . ALPRAZolam  0.5 mg Oral BID  . aspirin EC  81 mg Oral Daily  . atorvastatin  80 mg Oral q1800  . clopidogrel  75 mg Oral Daily  . irbesartan  150 mg Oral Daily   And  . hydrochlorothiazide  12.5 mg Oral Daily  . insulin aspart  0-9 Units Subcutaneous TID WC  . ipratropium-albuterol  3 mL Nebulization Q6H  . metoprolol tartrate  12.5 mg Oral BID  . mometasone-formoterol  2 puff Inhalation BID  . nicotine  21 mg Transdermal Daily  . pantoprazole  40 mg Oral Daily  . pentoxifylline  400 mg Oral Daily  . predniSONE  10 mg Oral Q breakfast  . rOPINIRole  2 mg Oral QHS  . verapamil  180 mg Oral QHS   Infusions:  . heparin 550 Units/hr (04/04/16 0043)  . nitroGLYCERIN    . nitroGLYCERIN Stopped (04/04/16 0307)    Assessment: 23 yoF c/o chest pressure hx of ASCVD s/p stent placement and abd aorta angiogram, DM, HTN and HLD. Heparin continues for r/o ACS  and is at goal (HL= 0.36)  Goal of Therapy:  Heparin level 0.3-0.7 units/ml Monitor platelets by anticoagulation protocol: Yes   Plan:  -No heparin changes needed -Will confirm a heparin level later today  Hildred Laser, Pharm D 04/04/2016 9:37 AM
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
## 2016-04-04 NOTE — Progress Notes (Signed)
SUBJECTIVE: Complains of chest pressure, retrosternal, down from 8/10 to 12-29-08. Husband passed away one year ago and things have been very stressful for her. Moved here in March from [place], [place].   ROS: Other than pertinent positives in "Subjective", all others were reviewed and found to be negative.   Intake/Output Summary (Last 24 hours) at 04/04/16 1226 Last data filed at 04/04/16 0800  Gross per 24 hour  Intake            46.71 ml  Output                0 ml  Net            46.71 ml    Current Facility-Administered Medications  Medication Dose Route Frequency Provider Last Rate Last Dose  . acetaminophen (TYLENOL) tablet 650 mg  650 mg Oral Q4H PRN Karmen Bongo, MD      . albuterol (PROVENTIL) (2.5 MG/3ML) 0.083% nebulizer solution 2.5 mg  2.5 mg Nebulization Q4H PRN Karmen Bongo, MD      . ALPRAZolam Duanne Moron) tablet 0.5 mg  0.5 mg Oral BID Karmen Bongo, MD   0.5 mg at 04/04/16 0954  . aspirin EC tablet 81 mg  81 mg Oral Daily Karmen Bongo, MD   81 mg at 04/04/16 0954  . atorvastatin (LIPITOR) tablet 80 mg  80 mg Oral q1800 Karmen Bongo, MD   80 mg at 04/04/16 0120  . clopidogrel (PLAVIX) tablet 75 mg  75 mg Oral Daily Karmen Bongo, MD   75 mg at 04/04/16 0954  . gi cocktail (Maalox,Lidocaine,Donnatal)  30 mL Oral TID PRN Susanne Greenhouse, MD      . heparin ADULT infusion 100 units/mL (25000 units/251m sodium chloride 0.45%)  550 Units/hr Intravenous Continuous EDorrene German RPH 5.5 mL/hr at 04/04/16 0043 550 Units/hr at 04/04/16 0043  . irbesartan (AVAPRO) tablet 150 mg  150 mg Oral Daily JKarmen Bongo MD       And  . hydrochlorothiazide (MICROZIDE) capsule 12.5 mg  12.5 mg Oral Daily JKarmen Bongo MD      . insulin aspart (novoLOG) injection 0-9 Units  0-9 Units Subcutaneous TID WC NReyne Dumas MD      . ipratropium-albuterol (DUONEB) 0.5-2.5 (3) MG/3ML nebulizer solution 3 mL  3 mL Nebulization Q6H JKarmen Bongo MD   3 mL at 04/04/16 0918  .  metoprolol tartrate (LOPRESSOR) tablet 12.5 mg  12.5 mg Oral BID JKarmen Bongo MD   12.5 mg at 04/04/16 0120  . mometasone-formoterol (DULERA) 200-5 MCG/ACT inhaler 2 puff  2 puff Inhalation BID JKarmen Bongo MD   2 puff at 04/04/16 [id number] . morphine 4 MG/ML injection 4 mg  4 mg Intravenous Q2H PRN JKarmen Bongo MD   4 mg at 04/04/16 0959  . nicotine (NICODERM CQ - dosed in mg/24 hours) patch 21 mg  21 mg Transdermal Daily JKarmen Bongo MD   21 mg at 04/04/16 0954  . nitroGLYCERIN (NITROSTAT) SL tablet 0.4 mg  0.4 mg Sublingual Q5 min PRN DLeo Grosser MD   0.4 mg at 04/03/16 1942  . nitroGLYCERIN 50 mg in dextrose 5 % 250 mL (0.2 mg/mL) infusion  0-200 mcg/min Intravenous Titrated JKarmen Bongo MD      . nitroGLYCERIN 50 mg in dextrose 5 % 250 mL (0.2 mg/mL) infusion  0-200 mcg/min Intravenous Titrated JKarmen Bongo MD   Stopped at 04/04/16 [id number] . ondansetron (ZOFRAN) injection 4 mg  4 mg  Intravenous Q6H PRN Karmen Bongo, MD   4 mg at 04/04/16 0749  . pantoprazole (PROTONIX) EC tablet 40 mg  40 mg Oral Daily Karmen Bongo, MD   40 mg at 04/04/16 0954  . pentoxifylline (TRENTAL) CR tablet 400 mg  400 mg Oral Daily Karmen Bongo, MD   400 mg at 04/04/16 0955  . predniSONE (DELTASONE) tablet 10 mg  10 mg Oral Q breakfast Karmen Bongo, MD   10 mg at 04/04/16 0749  . rOPINIRole (REQUIP) tablet 2 mg  2 mg Oral QHS Karmen Bongo, MD   2 mg at 04/04/16 0120  . verapamil (CALAN-SR) CR tablet 180 mg  180 mg Oral QHS Karmen Bongo, MD   180 mg at 04/04/16 0120    Vitals:   04/04/16 0800 04/04/16 0900 04/04/16 0919 04/04/16 1000  BP: (!) 91/41 (!) 86/40 (!) 86/40 (!) 133/57  Pulse: (!) 41 (!) 43 (!) 48 (!) 46  Resp: '13 10 13 '$ (!) 21  Temp:      TempSrc:      SpO2: 100% 97% 100% 100%  Weight:      Height:        PHYSICAL EXAM General: NAD HEENT: Poor dentition. Neck: No JVD, no thyromegaly.  Lungs: Clear to auscultation bilaterally with normal respiratory effort. CV:  Nondisplaced PMI.  Bradycardic, regular rhythm, normal S1/S2, no S3/S4, no murmur.  No pretibial edema. Abdomen: Soft, nontender, no distention.  Neurologic: Alert and oriented x 3.  Psych: Normal affect. Musculoskeletal: No gross deformities. Extremities: No clubbing or cyanosis.   TELEMETRY: Reviewed telemetry pt in sinus bradycardia.  LABS: Basic Metabolic Panel:  Recent Labs  04/03/16 1724 04/04/16 0823  NA 136 139  K 3.8 4.2  CL 103 105  CO2 26 29  GLUCOSE 96 93  BUN 11 8  CREATININE 0.89 0.91  CALCIUM 9.2 9.1   Liver Function Tests: No results for input(s): AST, ALT, ALKPHOS, BILITOT, PROT, ALBUMIN in the last 72 hours. No results for input(s): LIPASE, AMYLASE in the last 72 hours. CBC:  Recent Labs  04/03/16 1724 04/04/16 0823  WBC 7.0 6.2  HGB 11.4* 10.9*  HCT 35.5* 35.0*  MCV 89.6 91.1  PLT 329 289   Cardiac Enzymes:  Recent Labs  04/04/16 0208 04/04/16 0823  TROPONINI <0.03 <0.03   BNP: Invalid input(s): POCBNP D-Dimer: No results for input(s): DDIMER in the last 72 hours. Hemoglobin A1C: No results for input(s): HGBA1C in the last 72 hours. Fasting Lipid Panel:  Recent Labs  04/04/16 0823  CHOL 131  HDL 57  LDLCALC 66  TRIG 39  CHOLHDL 2.3   Thyroid Function Tests: No results for input(s): TSH, T4TOTAL, T3FREE, THYROIDAB in the last 72 hours.  Invalid input(s): FREET3 Anemia Panel: No results for input(s): VITAMINB12, FOLATE, FERRITIN, TIBC, IRON, RETICCTPCT in the last 72 hours.  RADIOLOGY: Dg Chest 2 View  Result Date: 04/03/2016 CLINICAL DATA:  Cough. EXAM: CHEST  2 VIEW COMPARISON:  March 05, 2016 FINDINGS: The heart size and mediastinal contours are within normal limits. Both lungs are clear. The visualized skeletal structures are unremarkable. IMPRESSION: No active cardiopulmonary disease. Electronically Signed   By: Dorise Bullion III M.D   On: 04/03/2016 17:51   Ct Angio Chest Aorta W And/or Wo Contrast  Result Date:  04/03/2016 CLINICAL DATA:  70 year old female with history of constant central chest pressure radiating to the back. EXAM: CT ANGIOGRAPHY CHEST WITH CONTRAST TECHNIQUE: Multidetector CT imaging of the chest was performed using  the standard protocol during bolus administration of intravenous contrast. Multiplanar CT image reconstructions and MIPs were obtained to evaluate the vascular anatomy. CONTRAST:  100 mL of Isovue 370. COMPARISON:  No priors. FINDINGS: Cardiovascular: Heart size is normal. There is no significant pericardial fluid, thickening or pericardial calcification. There is aortic atherosclerosis, as well as atherosclerosis of the great vessels of the mediastinum and the coronary arteries, including calcified atherosclerotic plaque in the left main, left anterior descending, left circumflex and right coronary arteries. There is no aneurysm or dissection of the thoracic aorta. Additionally, on precontrast images, there is no crescentic high attenuation associated with the thoracic aorta suggests the presence of acute intramural hemorrhage. Additionally, although the examination was not specifically tailored to evaluate the pulmonary arteries, there is no evidence of central, lobar or segmental sized filling defect to suggest a presence of clinically relevant pulmonary embolus. Mediastinum/Nodes: No pathologically enlarged mediastinal or hilar lymph nodes. Esophagus is unremarkable in appearance. 1 cm right thyroid lobe nodule is nonspecific. No axillary lymphadenopathy. Lungs/Pleura: Diffuse bronchial wall thickening with mild centrilobular and paraseptal emphysema, predominantly notable in the lung apices. No acute consolidative airspace disease. No pleural effusions. No suspicious appearing pulmonary nodules or masses. Upper Abdomen: Again noted is a 3.8 x 3.0 cm intermediate attenuation lesion in segment 7 of the liver which demonstrates peripheral nodular enhancement on the arterial phase examination,  compatible with a cavernous hemangioma. There is a smaller lesion with similar imaging characteristics measuring 1.3 cm in segment 4A. Aortic atherosclerosis. Musculoskeletal: Old compression fracture of T4 with approximately 20% loss of anterior vertebral body height. There are no aggressive appearing lytic or blastic lesions noted in the visualized portions of the skeleton. Review of the MIP images confirms the above findings. IMPRESSION: 1. No evidence of acute aortic syndrome. 2. No acute findings in the thorax to account for the patient's symptoms. 3. Aortic atherosclerosis, in addition to left main and 3 vessel coronary artery disease. Assessment for potential risk factor modification, dietary therapy or pharmacologic therapy may be warranted, if clinically indicated. 4. 2 cavernous hemangiomas in the liver, as above. 5. Mild diffuse bronchial wall thickening with mild centrilobular and paraseptal emphysema; imaging findings suggestive of underlying COPD. Electronically Signed   By: Vinnie Langton M.D.   On: 04/03/2016 20:53      ASSESSMENT AND PLAN: 1. Chest pain in context of CAD and prior stent: Thus far troponins normal. ECG unremarkable. Will plan for nuclear MPI study tomorrow. Bradycardic and hypotensive; will stop metoprolol and Avalide. Continue ASA, statin, Plavix. Will stop heparin.  2. Essential HTN: Hypotensive. Will hold Avalide and stop metoprolol. Continue verapamil for now.  3. Hyperlipidemia: Continue statin.  Time spent: 35 minutes.  Greater than 50% was spent in counseling (XXXX) and coordination of care with the patient.  Kate Sable, M.D., F.A.C.C.

## 2016-04-04 NOTE — Progress Notes (Signed)
*  PRELIMINARY RESULTS* Echocardiogram 2D Echocardiogram has been performed.  Leavy Cella 04/04/2016, 12:23 PM

## 2016-04-04 NOTE — Progress Notes (Signed)
[facility] PROGRESS NOTE  Kimberly Downs DTO:[id number] DOB: 12-27-1945 DOA: 04/03/2016   PCP: Beatris Si     Assessment/Plan: Principal Problem:   Unstable angina ([place]) Active Problems:   DM (diabetes mellitus), type 2 with renal complications (HCC)   Essential hypertension   CAD (coronary artery disease)   Tobacco abuse   COPD (chronic obstructive pulmonary disease) ([place])   70 year old female with a significant past medical history of coronary artery disease status post percutaneous intervention with unknown coronary anatomy, non-insulin dependent diabetes, hypertension, hyperlipidemia and tobacco abuse who is here for chest pain.  Cardiology was consulted to evaluate patient for chest pain  Assessment and plan Unstable angina Unclear etiology at this time in the setting of atypical anginal chest pain.  Patient is hemodynamically stable and ECG is without any signs of myocardial ischemia/infarction.  Troponin levels have all been within normal range -She has a known h/o CAD s/p 1 stent 9 years ago and with apparent CAD visualized on CT today -Positive FH with her father and brother both dying of "massive" MIs -+DM, HTN, HLD, tobacco -With this strong history and persistent CP, will admit to SDU and request cardiology consultation tonight Continue heparin and nitroglycerin, continue aspirin and Plavix, -morphine, oxygen, lipitor, beta blocker -Risk factor stratification with FLP and HgbA1c; will also check TSH and UDS -Encourage smoking cessation -Continue Trental CT of the chest shows no PE, no dissection, emphysema, 2-D echo to rule out wall motion abnormalities  DM -Glucose well controlled while in ER today -No recent A1c, will check -Hold  januvia , start patient on sliding scale insulin    HTN -On Avalide and Verapamil, will continue -Does not take beta blocker; started low-dose metoprolol  Tobacco dependence -Encourage cessation.  This was  discussed with the patient and should be reviewed on an ongoing basis.   -Patch ordered.  COPD -Continue Symbicort and Prednisone for now -Duonebs q6h with albuterol prn   Numbness and tingling-suspect peripheral neuropathy in the setting of vascular issues/ongoing smoking CT of the head to rule out CVA/TIA, no gross focal symptoms on exam today Check vitamin B 12  DVT prophylaxsis heparin drip  Code Status:  Full code    Family Communication: Discussed in detail with the patient, all imaging results, lab results explained to the patient   Disposition Plan:  Anticipate discharge in 2-3 days    Consultants:  Cardiology  Procedures:  None  Antibiotics: Anti-infectives    None         HPI/Subjective:  patient complaining of numbness and tingling in both hands and feet  Objective: Vitals:   04/04/16 0606 04/04/16 0645 04/04/16 0700 04/04/16 0715  BP: (!) 88/49 (!) 123/42 (!) 114/56 (!) 105/50  Pulse: (!) 42 (!) 43 (!) 42 (!) 42  Resp: '13 16 13 11  '$ Temp: 97.7 F (36.5 C)   97.3 F (36.3 C)  TempSrc: Oral   Oral  SpO2: 99% 100% 97% 99%  Weight: 46.2 kg (101 lb 12.8 oz)     Height:        Intake/Output Summary (Last 24 hours) at 04/04/16 0759 Last data filed at 04/04/16 0700  Gross per 24 hour  Intake            41.21 ml  Output                0 ml  Net  41.21 ml    Exam:  Examination:  General exam: Appears calm and comfortable  Respiratory system: Clear to auscultation. Respiratory effort normal. Cardiovascular system: S1 & S2 heard, RRR. No JVD, murmurs, rubs, gallops or clicks. No pedal edema. Gastrointestinal system: Abdomen is nondistended, soft and nontender. No organomegaly or masses felt. Normal bowel sounds heard. Central nervous system: Alert and oriented. No focal neurological deficits. Extremities: Symmetric 5 x 5 power. Skin: No rashes, lesions or ulcers Psychiatry: Judgement and insight appear normal. Mood & affect  appropriate.     Data Reviewed: I have personally reviewed following labs and imaging studies  Micro Results Recent Results (from the past 240 hour(s))  MRSA PCR Screening     Status: None   Collection Time: 04/04/16 12:35 AM  Result Value Ref Range Status   MRSA by PCR NEGATIVE NEGATIVE Final    Comment:        The GeneXpert MRSA Assay (FDA approved for NASAL specimens only), is one component of a comprehensive MRSA colonization surveillance program. It is not intended to diagnose MRSA infection nor to guide or monitor treatment for MRSA infections.     Radiology Reports Dg Chest 2 View  Result Date: 04/03/2016 CLINICAL DATA:  Cough. EXAM: CHEST  2 VIEW COMPARISON:  March 05, 2016 FINDINGS: The heart size and mediastinal contours are within normal limits. Both lungs are clear. The visualized skeletal structures are unremarkable. IMPRESSION: No active cardiopulmonary disease. Electronically Signed   By: Dorise Bullion III M.D   On: 04/03/2016 17:51   Ct Angio Chest Aorta W And/or Wo Contrast  Result Date: 04/03/2016 CLINICAL DATA:  70 year old female with history of constant central chest pressure radiating to the back. EXAM: CT ANGIOGRAPHY CHEST WITH CONTRAST TECHNIQUE: Multidetector CT imaging of the chest was performed using the standard protocol during bolus administration of intravenous contrast. Multiplanar CT image reconstructions and MIPs were obtained to evaluate the vascular anatomy. CONTRAST:  100 mL of Isovue 370. COMPARISON:  No priors. FINDINGS: Cardiovascular: Heart size is normal. There is no significant pericardial fluid, thickening or pericardial calcification. There is aortic atherosclerosis, as well as atherosclerosis of the great vessels of the mediastinum and the coronary arteries, including calcified atherosclerotic plaque in the left main, left anterior descending, left circumflex and right coronary arteries. There is no aneurysm or dissection of the thoracic  aorta. Additionally, on precontrast images, there is no crescentic high attenuation associated with the thoracic aorta suggests the presence of acute intramural hemorrhage. Additionally, although the examination was not specifically tailored to evaluate the pulmonary arteries, there is no evidence of central, lobar or segmental sized filling defect to suggest a presence of clinically relevant pulmonary embolus. Mediastinum/Nodes: No pathologically enlarged mediastinal or hilar lymph nodes. Esophagus is unremarkable in appearance. 1 cm right thyroid lobe nodule is nonspecific. No axillary lymphadenopathy. Lungs/Pleura: Diffuse bronchial wall thickening with mild centrilobular and paraseptal emphysema, predominantly notable in the lung apices. No acute consolidative airspace disease. No pleural effusions. No suspicious appearing pulmonary nodules or masses. Upper Abdomen: Again noted is a 3.8 x 3.0 cm intermediate attenuation lesion in segment 7 of the liver which demonstrates peripheral nodular enhancement on the arterial phase examination, compatible with a cavernous hemangioma. There is a smaller lesion with similar imaging characteristics measuring 1.3 cm in segment 4A. Aortic atherosclerosis. Musculoskeletal: Old compression fracture of T4 with approximately 20% loss of anterior vertebral body height. There are no aggressive appearing lytic or blastic lesions noted in the visualized portions of  the skeleton. Review of the MIP images confirms the above findings. IMPRESSION: 1. No evidence of acute aortic syndrome. 2. No acute findings in the thorax to account for the patient's symptoms. 3. Aortic atherosclerosis, in addition to left main and 3 vessel coronary artery disease. Assessment for potential risk factor modification, dietary therapy or pharmacologic therapy may be warranted, if clinically indicated. 4. 2 cavernous hemangiomas in the liver, as above. 5. Mild diffuse bronchial wall thickening with mild  centrilobular and paraseptal emphysema; imaging findings suggestive of underlying COPD. Electronically Signed   By: Vinnie Langton M.D.   On: 04/03/2016 20:53     CBC  Recent Labs Lab 04/03/16 1724  WBC 7.0  HGB 11.4*  HCT 35.5*  PLT 329  MCV 89.6  MCH 28.8  MCHC 32.1  RDW 13.1    Chemistries   Recent Labs Lab 04/03/16 1724  NA 136  K 3.8  CL 103  CO2 26  GLUCOSE 96  BUN 11  CREATININE 0.89  CALCIUM 9.2   ------------------------------------------------------------------------------------------------------------------ estimated creatinine clearance is 42.3 mL/min (by C-G formula based on SCr of 0.89 mg/dL). ------------------------------------------------------------------------------------------------------------------ No results for input(s): HGBA1C in the last 72 hours. ------------------------------------------------------------------------------------------------------------------ No results for input(s): CHOL, HDL, LDLCALC, TRIG, CHOLHDL, LDLDIRECT in the last 72 hours. ------------------------------------------------------------------------------------------------------------------ No results for input(s): TSH, T4TOTAL, T3FREE, THYROIDAB in the last 72 hours.  Invalid input(s): FREET3 ------------------------------------------------------------------------------------------------------------------ No results for input(s): VITAMINB12, FOLATE, FERRITIN, TIBC, IRON, RETICCTPCT in the last 72 hours.  Coagulation profile No results for input(s): INR, PROTIME in the last 168 hours.  No results for input(s): DDIMER in the last 72 hours.  Cardiac Enzymes  Recent Labs Lab 04/04/16 0208  TROPONINI <0.03   ------------------------------------------------------------------------------------------------------------------ Invalid input(s): POCBNP   CBG:  Recent Labs Lab 04/04/16 0043 04/04/16 0356 04/04/16 0417  GLUCAP 174* 43* 232*        Studies: Dg Chest 2 View  Result Date: 04/03/2016 CLINICAL DATA:  Cough. EXAM: CHEST  2 VIEW COMPARISON:  March 05, 2016 FINDINGS: The heart size and mediastinal contours are within normal limits. Both lungs are clear. The visualized skeletal structures are unremarkable. IMPRESSION: No active cardiopulmonary disease. Electronically Signed   By: Dorise Bullion III M.D   On: 04/03/2016 17:51   Ct Angio Chest Aorta W And/or Wo Contrast  Result Date: 04/03/2016 CLINICAL DATA:  70 year old female with history of constant central chest pressure radiating to the back. EXAM: CT ANGIOGRAPHY CHEST WITH CONTRAST TECHNIQUE: Multidetector CT imaging of the chest was performed using the standard protocol during bolus administration of intravenous contrast. Multiplanar CT image reconstructions and MIPs were obtained to evaluate the vascular anatomy. CONTRAST:  100 mL of Isovue 370. COMPARISON:  No priors. FINDINGS: Cardiovascular: Heart size is normal. There is no significant pericardial fluid, thickening or pericardial calcification. There is aortic atherosclerosis, as well as atherosclerosis of the great vessels of the mediastinum and the coronary arteries, including calcified atherosclerotic plaque in the left main, left anterior descending, left circumflex and right coronary arteries. There is no aneurysm or dissection of the thoracic aorta. Additionally, on precontrast images, there is no crescentic high attenuation associated with the thoracic aorta suggests the presence of acute intramural hemorrhage. Additionally, although the examination was not specifically tailored to evaluate the pulmonary arteries, there is no evidence of central, lobar or segmental sized filling defect to suggest a presence of clinically relevant pulmonary embolus. Mediastinum/Nodes: No pathologically enlarged mediastinal or hilar lymph nodes. Esophagus is unremarkable in appearance. 1 cm right  thyroid lobe nodule is nonspecific. No  axillary lymphadenopathy. Lungs/Pleura: Diffuse bronchial wall thickening with mild centrilobular and paraseptal emphysema, predominantly notable in the lung apices. No acute consolidative airspace disease. No pleural effusions. No suspicious appearing pulmonary nodules or masses. Upper Abdomen: Again noted is a 3.8 x 3.0 cm intermediate attenuation lesion in segment 7 of the liver which demonstrates peripheral nodular enhancement on the arterial phase examination, compatible with a cavernous hemangioma. There is a smaller lesion with similar imaging characteristics measuring 1.3 cm in segment 4A. Aortic atherosclerosis. Musculoskeletal: Old compression fracture of T4 with approximately 20% loss of anterior vertebral body height. There are no aggressive appearing lytic or blastic lesions noted in the visualized portions of the skeleton. Review of the MIP images confirms the above findings. IMPRESSION: 1. No evidence of acute aortic syndrome. 2. No acute findings in the thorax to account for the patient's symptoms. 3. Aortic atherosclerosis, in addition to left main and 3 vessel coronary artery disease. Assessment for potential risk factor modification, dietary therapy or pharmacologic therapy may be warranted, if clinically indicated. 4. 2 cavernous hemangiomas in the liver, as above. 5. Mild diffuse bronchial wall thickening with mild centrilobular and paraseptal emphysema; imaging findings suggestive of underlying COPD. Electronically Signed   By: Vinnie Langton M.D.   On: 04/03/2016 20:53      No results found for: HGBA1C Lab Results  Component Value Date   CREATININE 0.89 04/03/2016       Scheduled Meds: . ALPRAZolam  0.5 mg Oral BID  . aspirin EC  81 mg Oral Daily  . atorvastatin  80 mg Oral q1800  . clopidogrel  75 mg Oral Daily  . irbesartan  150 mg Oral Daily   And  . hydrochlorothiazide  12.5 mg Oral Daily  . insulin aspart  0-9 Units Subcutaneous Q4H  . ipratropium-albuterol  3 mL  Nebulization Q6H  . metoprolol tartrate  12.5 mg Oral BID  . mometasone-formoterol  2 puff Inhalation BID  . nicotine  21 mg Transdermal Daily  . pantoprazole  40 mg Oral Daily  . pentoxifylline  400 mg Oral Daily  . predniSONE  10 mg Oral Q breakfast  . rOPINIRole  2 mg Oral QHS  . verapamil  180 mg Oral QHS   Continuous Infusions: . heparin 550 Units/hr (04/04/16 0043)  . nitroGLYCERIN    . nitroGLYCERIN Stopped (04/04/16 0307)     LOS: 0 days    Time spent: >30 MINS    [facility]  [facility] Pager [phone]. If 7PM-7AM, please contact night-coverage at [email or web address], password [facility] 04/04/2016, 7:59 AM  LOS: 0 days

## 2016-04-04 NOTE — Progress Notes (Signed)
Patient does not want GI cocktail. She only wants morphine and she drifts off to sleep whiles asking for morphine and zofran.
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
## 2016-04-04 NOTE — Progress Notes (Signed)
Hypoglycemic Event  CBG: 43  Treatment: 1 amp d50  Symptoms: sweaty  Follow-up CBG: [id number] CBG Result:232  Possible Reasons for Event: NPO  Comments/MD notified:Protocol    Amo Kuffour, Trilby Drummer

## 2016-04-05 ENCOUNTER — Inpatient Hospital Stay (HOSPITAL_COMMUNITY): Payer: Medicare Other

## 2016-04-05 DIAGNOSIS — R079 Chest pain, unspecified: Secondary | ICD-10-CM

## 2016-04-05 DIAGNOSIS — I25110 Atherosclerotic heart disease of native coronary artery with unstable angina pectoris: Principal | ICD-10-CM

## 2016-04-05 LAB — GLUCOSE, CAPILLARY
GLUCOSE-CAPILLARY: 171 mg/dL — AB (ref 65–99)
GLUCOSE-CAPILLARY: 162 mg/dL — AB (ref 65–99)
GLUCOSE-CAPILLARY: 82 mg/dL (ref 65–99)
Glucose-Capillary: 109 mg/dL — ABNORMAL HIGH (ref 65–99)

## 2016-04-05 LAB — NM MYOCAR MULTI W/SPECT W/WALL MOTION / EF
CHL CUP MPHR: 149 {beats}/min
CHL CUP NUCLEAR SDS: 3
CHL CUP NUCLEAR SRS: 3
CHL CUP NUCLEAR SSS: 6
CHL CUP RESTING HR STRESS: 56 {beats}/min
CSEPPHR: 85 {beats}/min
Estimated workload: 1 METS
LHR: 0.35
LVDIAVOL: 89 mL (ref 46–106)
LVSYSVOL: 33 mL
Percent HR: 57 %
TID: 1.16

## 2016-04-05 LAB — COMPREHENSIVE METABOLIC PANEL
ALBUMIN: 3.2 g/dL — AB (ref 3.5–5.0)
ALK PHOS: 59 U/L (ref 38–126)
ALT: 11 U/L — ABNORMAL LOW (ref 14–54)
ANION GAP: 6 (ref 5–15)
AST: 16 U/L (ref 15–41)
BUN: 10 mg/dL (ref 6–20)
CALCIUM: 9.8 mg/dL (ref 8.9–10.3)
CHLORIDE: 104 mmol/L (ref 101–111)
CO2: 29 mmol/L (ref 22–32)
Creatinine, Ser: 0.95 mg/dL (ref 0.44–1.00)
GFR calc non Af Amer: 59 mL/min — ABNORMAL LOW (ref >60)
GLUCOSE: 87 mg/dL (ref 65–99)
POTASSIUM: 4.5 mmol/L (ref 3.5–5.1)
SODIUM: 139 mmol/L (ref 135–145)
Total Bilirubin: 0.4 mg/dL (ref 0.3–1.2)
Total Protein: 5.3 g/dL — ABNORMAL LOW (ref 6.5–8.1)

## 2016-04-05 LAB — CBC
HCT: 35.2 % — ABNORMAL LOW (ref 36.0–46.0)
HEMOGLOBIN: 10.9 g/dL — AB (ref 12.0–15.0)
MCH: 28.5 pg (ref 26.0–34.0)
MCHC: 31 g/dL (ref 30.0–36.0)
MCV: 92.1 fL (ref 78.0–100.0)
PLATELETS: 307 10*3/uL (ref 150–400)
RBC: 3.82 MIL/uL — ABNORMAL LOW (ref 3.87–5.11)
RDW: 13.3 % (ref 11.5–15.5)
WBC: 7.2 10*3/uL (ref 4.0–10.5)

## 2016-04-05 LAB — HEMOGLOBIN A1C
HEMOGLOBIN A1C: 6.1 % — AB (ref 4.8–5.6)
HEMOGLOBIN A1C: 5.9 % — AB (ref 4.8–5.6)
Mean Plasma Glucose: 128 mg/dL
Mean Plasma Glucose: 123 mg/dL

## 2016-04-05 LAB — T4, FREE: FREE T4: 0.92 ng/dL (ref 0.61–1.12)

## 2016-04-05 MED ORDER — REGADENOSON 0.4 MG/5ML IV SOLN
INTRAVENOUS | Status: AC
  Filled 2016-04-05: qty 5

## 2016-04-05 MED ORDER — REGADENOSON 0.4 MG/5ML IV SOLN
0.4000 mg | Freq: Once | INTRAVENOUS | Status: AC
  Administered 2016-04-05: 0.4 mg via INTRAVENOUS
  Filled 2016-04-05: qty 5

## 2016-04-05 MED ORDER — TECHNETIUM TC 99M TETROFOSMIN IV KIT
10.0000 | PACK | Freq: Once | INTRAVENOUS | Status: AC | PRN
  Administered 2016-04-05: 10 via INTRAVENOUS

## 2016-04-05 MED ORDER — CYANOCOBALAMIN 1000 MCG/ML IJ SOLN
1000.0000 ug | Freq: Every day | INTRAMUSCULAR | Status: DC
Start: 2016-04-05 — End: 2016-04-06
  Administered 2016-04-05 – 2016-04-06 (×2): 1000 ug via INTRAMUSCULAR
  Filled 2016-04-05 (×2): qty 1

## 2016-04-05 MED ORDER — MORPHINE SULFATE (PF) 2 MG/ML IV SOLN
2.0000 mg | INTRAVENOUS | Status: DC | PRN
  Administered 2016-04-05 – 2016-04-06 (×2): 2 mg via INTRAVENOUS
  Filled 2016-04-05 (×3): qty 1

## 2016-04-05 MED ORDER — TECHNETIUM TC 99M TETROFOSMIN IV KIT
30.0000 | PACK | Freq: Once | INTRAVENOUS | Status: AC | PRN
  Administered 2016-04-05: 30 via INTRAVENOUS

## 2016-04-05 MED ORDER — ENOXAPARIN SODIUM 40 MG/0.4ML ~~LOC~~ SOLN
40.0000 mg | SUBCUTANEOUS | Status: DC
  Administered 2016-04-05: 40 mg via SUBCUTANEOUS
  Filled 2016-04-05: qty 0.4

## 2016-04-05 NOTE — Progress Notes (Signed)
Patient Name: Kimberly Downs Date of Encounter: 04/05/2016  Principal Problem:   Unstable angina ([place]) Active Problems:   DM (diabetes mellitus), type 2 with renal complications ([place])   Essential hypertension   CAD (coronary artery disease)   Tobacco abuse   Pain in the chest   COPD (chronic obstructive pulmonary disease) (HCC)   Hyperlipidemia   History of coronary artery stent placement    Patient Profile: 70 year old female with past medical history of CAD s/p PCI (unknown artery), DM, HTN, HLD. Developed chest pressure while resting in her pool. Plan is for [employer] today.   SUBJECTIVE: Feels well, denies chest pressure and pain. She was seen today in nuclear medicine.    OBJECTIVE Vitals:   04/05/16 0738 04/05/16 0800 04/05/16 0821 04/05/16 1139  BP: (!) 100/50 (!) 106/58 (!) 106/58 112/81  Pulse: (!) 53 (!) 53 (!) 56   Resp: '16 16 17   '$ Temp: 97.4 F (36.3 C)     TempSrc: Oral     SpO2: 95% 96% 90%   Weight:      Height:        Intake/Output Summary (Last 24 hours) at 04/05/16 1147 Last data filed at 04/05/16 7939  Gross per 24 hour  Intake              772 ml  Output              400 ml  Net              372 ml   Filed Weights   04/03/16 1652 04/04/16 0049 04/04/16 0606  Weight: 102 lb (46.3 kg) 103 lb 4.8 oz (46.9 kg) 101 lb 12.8 oz (46.2 kg)    PHYSICAL EXAM General: Well developed, well nourished, female in no acute distress. Head: Normocephalic, atraumatic.  Neck: Supple without bruits, no JVD. Lungs:  Resp regular and unlabored, CTA. Heart: RRR, S1, S2, no S3, S4, or murmur; no rub. Abdomen: Soft, non-tender, non-distended, BS + x 4.  Extremities: No clubbing, cyanosis, no edema.  Neuro: Alert and oriented X 3. Moves all extremities spontaneously. Psych: Normal affect.  LABS: CBC: Recent Labs  04/04/16 0823 04/05/16 0329  WBC 6.2 7.2  HGB 10.9* 10.9*  HCT 35.0* 35.2*  MCV 91.1 92.1  PLT 289 030   Basic Metabolic  Panel: Recent Labs  04/04/16 0823 04/05/16 0329  NA 139 139  K 4.2 4.5  CL 105 104  CO2 29 29  GLUCOSE 93 87  BUN 8 10  CREATININE 0.91 0.95  CALCIUM 9.1 9.8   Liver Function Tests: Recent Labs  04/05/16 0329  AST 16  ALT 11*  ALKPHOS 59  BILITOT 0.4  PROT 5.3*  ALBUMIN 3.2*   Cardiac Enzymes: Recent Labs  04/04/16 0208 04/04/16 0823 04/04/16 1231  TROPONINI <0.03 <0.03 <0.03    Recent Labs  04/03/16 1733 04/03/16 2135  TROPIPOC 0.00 0.01   Hemoglobin A1C: Recent Labs  04/04/16 0823  HGBA1C 5.9*   Fasting Lipid Panel: Recent Labs  04/04/16 0823  CHOL 131  HDL 57  LDLCALC 66  TRIG 39  CHOLHDL 2.3   Thyroid Function Tests: Recent Labs  04/03/16 1718  TSH 0.161*   Anemia Panel: Recent Labs  04/04/16 1231  VITAMINB12 123*     Current Facility-Administered Medications:  .  acetaminophen (TYLENOL) tablet 650 mg, 650 mg, Oral, Q4H PRN, Karmen Bongo, MD .  albuterol (PROVENTIL) (2.5 MG/3ML) 0.083% nebulizer solution  2.5 mg, 2.5 mg, Nebulization, Q4H PRN, Karmen Bongo, MD .  ALPRAZolam Duanne Moron) tablet 0.5 mg, 0.5 mg, Oral, BID, Karmen Bongo, MD, 0.5 mg at 04/05/16 0959 .  aspirin EC tablet 81 mg, 81 mg, Oral, Daily, Karmen Bongo, MD, 81 mg at 04/05/16 0959 .  atorvastatin (LIPITOR) tablet 80 mg, 80 mg, Oral, q1800, Karmen Bongo, MD, 80 mg at 04/04/16 1737 .  clopidogrel (PLAVIX) tablet 75 mg, 75 mg, Oral, Daily, Karmen Bongo, MD, 75 mg at 04/05/16 0959 .  gi cocktail (Maalox,Lidocaine,Donnatal), 30 mL, Oral, TID PRN, Susanne Greenhouse, MD .  insulin aspart (novoLOG) injection 0-9 Units, 0-9 Units, Subcutaneous, TID WC, Reyne Dumas, MD, 1 Units at 04/04/16 1737 .  ipratropium-albuterol (DUONEB) 0.5-2.5 (3) MG/3ML nebulizer solution 3 mL, 3 mL, Nebulization, TID, Karmen Bongo, MD, 3 mL at 04/05/16 0821 .  mometasone-formoterol (DULERA) 200-5 MCG/ACT inhaler 2 puff, 2 puff, Inhalation, BID, Karmen Bongo, MD, 2 puff at 04/05/16  [phone] .  morphine 4 MG/ML injection 4 mg, 4 mg, Intravenous, Q2H PRN, Karmen Bongo, MD, 4 mg at 04/05/16 0959 .  nicotine (NICODERM CQ - dosed in mg/24 hours) patch 21 mg, 21 mg, Transdermal, Daily, Karmen Bongo, MD, 21 mg at 04/05/16 0959 .  nitroGLYCERIN (NITROSTAT) SL tablet 0.4 mg, 0.4 mg, Sublingual, Q5 min PRN, Leo Grosser, MD, 0.4 mg at 04/03/16 1942 .  nitroGLYCERIN 50 mg in dextrose 5 % 250 mL (0.2 mg/mL) infusion, 0-200 mcg/min, Intravenous, Titrated, Karmen Bongo, MD .  nitroGLYCERIN 50 mg in dextrose 5 % 250 mL (0.2 mg/mL) infusion, 0-200 mcg/min, Intravenous, Titrated, Karmen Bongo, MD, Stopped at 04/04/16 [phone] .  ondansetron (ZOFRAN) injection 4 mg, 4 mg, Intravenous, Q6H PRN, Karmen Bongo, MD, 4 mg at 04/05/16 1011 .  pantoprazole (PROTONIX) EC tablet 40 mg, 40 mg, Oral, Daily, Karmen Bongo, MD, 40 mg at 04/05/16 0959 .  pentoxifylline (TRENTAL) CR tablet 400 mg, 400 mg, Oral, Daily, Karmen Bongo, MD, 400 mg at 04/05/16 0959 .  predniSONE (DELTASONE) tablet 10 mg, 10 mg, Oral, Q breakfast, Karmen Bongo, MD, 10 mg at 04/05/16 0801 .  regadenoson (LEXISCAN) 0.4 MG/5ML injection SOLN, , , ,  .  regadenoson (LEXISCAN) injection SOLN 0.4 mg, 0.4 mg, Intravenous, Once, [employer], [place] .  rOPINIRole (REQUIP) tablet 2 mg, 2 mg, Oral, QHS, Karmen Bongo, MD, 2 mg at 04/04/16 2216 .  verapamil (CALAN-SR) CR tablet 180 mg, 180 mg, Oral, QHS, Karmen Bongo, MD, 180 mg at 04/04/16 0120 . nitroGLYCERIN    . nitroGLYCERIN Stopped (04/04/16 0307)         ECG: Sinus bradycardia  Radiology/Studies: Dg Chest 2 View  Result Date: 04/03/2016 CLINICAL DATA:  Cough. EXAM: CHEST  2 VIEW COMPARISON:  March 05, 2016 FINDINGS: The heart size and mediastinal contours are within normal limits. Both lungs are clear. The visualized skeletal structures are unremarkable. IMPRESSION: No active cardiopulmonary disease. Electronically Signed   By: Dorise Bullion III M.D   On: 04/03/2016 17:51   Ct  Head Wo Contrast  Result Date: 04/04/2016 CLINICAL DATA:  70 year old female with history of dizziness, numbness and tingling in both hands and in the right foot for the past 2 days. EXAM: CT HEAD WITHOUT CONTRAST TECHNIQUE: Contiguous axial images were obtained from the base of the skull through the vertex without intravenous contrast. COMPARISON:  Head CT 11/12/2015. FINDINGS: Patchy and confluent areas of decreased attenuation are noted throughout the deep and periventricular white matter of the cerebral hemispheres bilaterally, compatible with  chronic microvascular ischemic disease. No acute intracranial abnormalities. Specifically, no evidence of acute intracranial hemorrhage, no definite findings of acute/subacute cerebral ischemia, no mass, mass effect, hydrocephalus or abnormal intra or extra-axial fluid collections. Visualized paranasal sinuses and right mastoid are well pneumatized. Small chronic left mastoid effusion, similar to the prior study. No acute displaced skull fractures are identified. IMPRESSION: 1. No acute intracranial abnormalities. 2. Chronic microvascular ischemic changes in the cerebral white matter redemonstrated, as above. 3. Small chronic left mastoid effusion is unchanged. Electronically Signed   By: Vinnie Langton M.D.   On: 04/04/2016 13:42   Ct Angio Chest Aorta W And/or Wo Contrast  Result Date: 04/03/2016 CLINICAL DATA:  70 year old female with history of constant central chest pressure radiating to the back. EXAM: CT ANGIOGRAPHY CHEST WITH CONTRAST TECHNIQUE: Multidetector CT imaging of the chest was performed using the standard protocol during bolus administration of intravenous contrast. Multiplanar CT image reconstructions and MIPs were obtained to evaluate the vascular anatomy. CONTRAST:  100 mL of Isovue 370. COMPARISON:  No priors. FINDINGS: Cardiovascular: Heart size is normal. There is no significant pericardial fluid, thickening or pericardial calcification. There  is aortic atherosclerosis, as well as atherosclerosis of the great vessels of the mediastinum and the coronary arteries, including calcified atherosclerotic plaque in the left main, left anterior descending, left circumflex and right coronary arteries. There is no aneurysm or dissection of the thoracic aorta. Additionally, on precontrast images, there is no crescentic high attenuation associated with the thoracic aorta suggests the presence of acute intramural hemorrhage. Additionally, although the examination was not specifically tailored to evaluate the pulmonary arteries, there is no evidence of central, lobar or segmental sized filling defect to suggest a presence of clinically relevant pulmonary embolus. Mediastinum/Nodes: No pathologically enlarged mediastinal or hilar lymph nodes. Esophagus is unremarkable in appearance. 1 cm right thyroid lobe nodule is nonspecific. No axillary lymphadenopathy. Lungs/Pleura: Diffuse bronchial wall thickening with mild centrilobular and paraseptal emphysema, predominantly notable in the lung apices. No acute consolidative airspace disease. No pleural effusions. No suspicious appearing pulmonary nodules or masses. Upper Abdomen: Again noted is a 3.8 x 3.0 cm intermediate attenuation lesion in segment 7 of the liver which demonstrates peripheral nodular enhancement on the arterial phase examination, compatible with a cavernous hemangioma. There is a smaller lesion with similar imaging characteristics measuring 1.3 cm in segment 4A. Aortic atherosclerosis. Musculoskeletal: Old compression fracture of T4 with approximately 20% loss of anterior vertebral body height. There are no aggressive appearing lytic or blastic lesions noted in the visualized portions of the skeleton. Review of the MIP images confirms the above findings. IMPRESSION: 1. No evidence of acute aortic syndrome. 2. No acute findings in the thorax to account for the patient's symptoms. 3. Aortic atherosclerosis, in  addition to left main and 3 vessel coronary artery disease. Assessment for potential risk factor modification, dietary therapy or pharmacologic therapy may be warranted, if clinically indicated. 4. 2 cavernous hemangiomas in the liver, as above. 5. Mild diffuse bronchial wall thickening with mild centrilobular and paraseptal emphysema; imaging findings suggestive of underlying COPD. Electronically Signed   By: Vinnie Langton M.D.   On: 04/03/2016 20:53     Current Medications:  . ALPRAZolam  0.5 mg Oral BID  . aspirin EC  81 mg Oral Daily  . atorvastatin  80 mg Oral q1800  . clopidogrel  75 mg Oral Daily  . insulin aspart  0-9 Units Subcutaneous TID WC  . ipratropium-albuterol  3 mL Nebulization TID  .  mometasone-formoterol  2 puff Inhalation BID  . nicotine  21 mg Transdermal Daily  . pantoprazole  40 mg Oral Daily  . pentoxifylline  400 mg Oral Daily  . predniSONE  10 mg Oral Q breakfast  . regadenoson      . regadenoson  0.4 mg Intravenous Once  . rOPINIRole  2 mg Oral QHS  . verapamil  180 mg Oral QHS   . nitroGLYCERIN    . nitroGLYCERIN Stopped (04/04/16 0307)    ASSESSMENT AND PLAN: Principal Problem:   Unstable angina (HCC) Active Problems:   DM (diabetes mellitus), type 2 with renal complications ([facility])   Essential hypertension   CAD (coronary artery disease)   Tobacco abuse   Pain in the chest   COPD (chronic obstructive pulmonary disease) (HCC)   Hyperlipidemia   History of coronary artery stent placement  1. Chest pain in context of CAD and prior stent: Thus far troponins normal. ECG unremarkable. Will plan for nuclear MPI study today.  She was bradycardic and hypotensive yesterday; metoprolol and Avalide discontinued. Continue ASA, statin, Plavix.   2. Essential HTN: Hypotensive. Will hold Avalide and stop metoprolol. Continue verapamil for now. BP today is 124/71.     Signed, Arbutus Leas , NP 11:47 AM 04/05/2016 Pager [phone]  I have examined the  patient and reviewed assessment and plan and discussed with patient.  Agree with above as stated.  Known PAD and tobacco history.  Eval for CAD.  Some atypical features to her pain.  Prior cath with PCI several years ago.   Larae Grooms

## 2016-04-05 NOTE — Care Management Important Message (Signed)
Important Message  Patient Details  Name: Kimberly Downs MRN: [id number] Date of Birth: 23-Mar-1946   Medicare Important Message Given:  Yes    Loann Quill 04/05/2016, 8:32 AM

## 2016-04-05 NOTE — Progress Notes (Signed)
[facility] PROGRESS NOTE  Kimberly Downs WIO:[id number] DOB: 14-Mar-1946 DOA: 04/03/2016   PCP: Beatris Si     Assessment/Plan: Principal Problem:   Unstable angina ([place]) Active Problems:   DM (diabetes mellitus), type 2 with renal complications (HCC)   Essential hypertension   CAD (coronary artery disease)   Tobacco abuse   Pain in the chest   COPD (chronic obstructive pulmonary disease) ([place])   Hyperlipidemia   History of coronary artery stent placement   70 year old female with a significant past medical history of coronary artery disease status post percutaneous intervention with unknown coronary anatomy, non-insulin dependent diabetes, hypertension, hyperlipidemia and tobacco abuse who is here for chest pain.  Cardiology was consulted to evaluate patient for chest pain  Assessment and plan Unstable angina Unclear etiology at this time in the setting of atypical anginal chest pain.  Patient is hemodynamically stable and ECG is without any signs of myocardial ischemia/infarction.  Troponin levels have all been within normal range -She has a known h/o CAD s/p 1 stent 9 years ago and with apparent CAD visualized on CT today -Positive FH with her father and brother both dying of "massive" MIs -+DM, HTN, HLD, tobacco -With this strong history and persistent CP, requested cardiology consultation   Continue heparin and nitroglycerin, continue aspirin and Plavix, -morphine, oxygen, lipitor, dc beta blocker, avalide due to low BP -  FLP -LDL 66 and HgbA1c 5.9 ;   TSH 0.161, check free T4   -Encourage smoking cessation -Continue Trental CT of the chest shows no PE, no dissection, emphysema,There was no ST segment deviation noted during stress.No T wave inversion was noted during stress.Defect 1: There is a small defect of moderate severity present in the mid inferior and apical inferior location. This appears to be artifactual due to overlying bowel activity. Cannot  rule out ischemia. 2-D echo shows EF of 40-45%  DM -Glucose well controlled while in ER today -Hold  januvia , start patient on sliding scale insulin    HTN -hold  Avalide and continue  Verapamil, will continue -Does not take beta blocker; started low-dose metoprolol  Tobacco dependence -Encourage cessation.  This was discussed with the patient and should be reviewed on an ongoing basis.   -Patch ordered.  COPD -Continue Symbicort and Prednisone for now -Duonebs q6h with albuterol prn   Numbness and tingling-suspect peripheral neuropathy in the setting of vascular issues/ongoing smoking CT of the head to rule out CVA/TIA, no gross focal symptoms on exam today   vitamin B 12 123, started B12 injections   DVT prophylaxsis  lovenox  Code Status:  Full code    Family Communication: Discussed in detail with the patient, all imaging results, lab results explained to the patient   Disposition Plan:  Anticipate discharge in 2-3 days    Consultants:  Cardiology  Procedures:  None  Antibiotics: Anti-infectives    None         HPI/Subjective:  well, denies chest pressure and pain. She was seen today in nuclear medicine  Objective: Vitals:   04/05/16 1151 04/05/16 1153 04/05/16 1250 04/05/16 1317  BP: 124/71 (!) 117/56 (!) 111/50 (!) 106/50  Pulse:    68  Resp:    12  Temp:    97.6 F (36.4 C)  TempSrc:    Oral  SpO2:    100%  Weight:      Height:        Intake/Output Summary (Last  24 hours) at 04/05/16 1346 Last data filed at 04/05/16 0932  Gross per 24 hour  Intake              772 ml  Output              400 ml  Net              372 ml    Exam:  Examination:  General exam: Appears calm and comfortable  Respiratory system: Clear to auscultation. Respiratory effort normal. Cardiovascular system: S1 & S2 heard, RRR. No JVD, murmurs, rubs, gallops or clicks. No pedal edema. Gastrointestinal system: Abdomen is nondistended, soft and nontender.  No organomegaly or masses felt. Normal bowel sounds heard. Central nervous system: Alert and oriented. No focal neurological deficits. Extremities: Symmetric 5 x 5 power. Skin: No rashes, lesions or ulcers Psychiatry: Judgement and insight appear normal. Mood & affect appropriate.     Data Reviewed: I have personally reviewed following labs and imaging studies  Micro Results Recent Results (from the past 240 hour(s))  MRSA PCR Screening     Status: None   Collection Time: 04/04/16 12:35 AM  Result Value Ref Range Status   MRSA by PCR NEGATIVE NEGATIVE Final    Comment:        The GeneXpert MRSA Assay (FDA approved for NASAL specimens only), is one component of a comprehensive MRSA colonization surveillance program. It is not intended to diagnose MRSA infection nor to guide or monitor treatment for MRSA infections.     Radiology Reports Dg Chest 2 View  Result Date: 04/03/2016 CLINICAL DATA:  Cough. EXAM: CHEST  2 VIEW COMPARISON:  March 05, 2016 FINDINGS: The heart size and mediastinal contours are within normal limits. Both lungs are clear. The visualized skeletal structures are unremarkable. IMPRESSION: No active cardiopulmonary disease. Electronically Signed   By: Dorise Bullion III M.D   On: 04/03/2016 17:51   Ct Head Wo Contrast  Result Date: 04/04/2016 CLINICAL DATA:  70 year old female with history of dizziness, numbness and tingling in both hands and in the right foot for the past 2 days. EXAM: CT HEAD WITHOUT CONTRAST TECHNIQUE: Contiguous axial images were obtained from the base of the skull through the vertex without intravenous contrast. COMPARISON:  Head CT 11/12/2015. FINDINGS: Patchy and confluent areas of decreased attenuation are noted throughout the deep and periventricular white matter of the cerebral hemispheres bilaterally, compatible with chronic microvascular ischemic disease. No acute intracranial abnormalities. Specifically, no evidence of acute intracranial  hemorrhage, no definite findings of acute/subacute cerebral ischemia, no mass, mass effect, hydrocephalus or abnormal intra or extra-axial fluid collections. Visualized paranasal sinuses and right mastoid are well pneumatized. Small chronic left mastoid effusion, similar to the prior study. No acute displaced skull fractures are identified. IMPRESSION: 1. No acute intracranial abnormalities. 2. Chronic microvascular ischemic changes in the cerebral white matter redemonstrated, as above. 3. Small chronic left mastoid effusion is unchanged. Electronically Signed   By: Vinnie Langton M.D.   On: 04/04/2016 13:42   Nm Myocar Multi W/spect W/wall Motion / Ef  Result Date: 04/05/2016  There was no ST segment deviation noted during stress.  No T wave inversion was noted during stress.  Defect 1: There is a small defect of moderate severity present in the mid inferior and apical inferior location. This appears to be artifactual due to overlying bowel activity. Cannot rule out ischemia.  This is a low risk study.  The left ventricular ejection fraction is normal (  55-65%).    Ct Angio Chest Aorta W And/or Wo Contrast  Result Date: 04/03/2016 CLINICAL DATA:  70 year old female with history of constant central chest pressure radiating to the back. EXAM: CT ANGIOGRAPHY CHEST WITH CONTRAST TECHNIQUE: Multidetector CT imaging of the chest was performed using the standard protocol during bolus administration of intravenous contrast. Multiplanar CT image reconstructions and MIPs were obtained to evaluate the vascular anatomy. CONTRAST:  100 mL of Isovue 370. COMPARISON:  No priors. FINDINGS: Cardiovascular: Heart size is normal. There is no significant pericardial fluid, thickening or pericardial calcification. There is aortic atherosclerosis, as well as atherosclerosis of the great vessels of the mediastinum and the coronary arteries, including calcified atherosclerotic plaque in the left main, left anterior descending,  left circumflex and right coronary arteries. There is no aneurysm or dissection of the thoracic aorta. Additionally, on precontrast images, there is no crescentic high attenuation associated with the thoracic aorta suggests the presence of acute intramural hemorrhage. Additionally, although the examination was not specifically tailored to evaluate the pulmonary arteries, there is no evidence of central, lobar or segmental sized filling defect to suggest a presence of clinically relevant pulmonary embolus. Mediastinum/Nodes: No pathologically enlarged mediastinal or hilar lymph nodes. Esophagus is unremarkable in appearance. 1 cm right thyroid lobe nodule is nonspecific. No axillary lymphadenopathy. Lungs/Pleura: Diffuse bronchial wall thickening with mild centrilobular and paraseptal emphysema, predominantly notable in the lung apices. No acute consolidative airspace disease. No pleural effusions. No suspicious appearing pulmonary nodules or masses. Upper Abdomen: Again noted is a 3.8 x 3.0 cm intermediate attenuation lesion in segment 7 of the liver which demonstrates peripheral nodular enhancement on the arterial phase examination, compatible with a cavernous hemangioma. There is a smaller lesion with similar imaging characteristics measuring 1.3 cm in segment 4A. Aortic atherosclerosis. Musculoskeletal: Old compression fracture of T4 with approximately 20% loss of anterior vertebral body height. There are no aggressive appearing lytic or blastic lesions noted in the visualized portions of the skeleton. Review of the MIP images confirms the above findings. IMPRESSION: 1. No evidence of acute aortic syndrome. 2. No acute findings in the thorax to account for the patient's symptoms. 3. Aortic atherosclerosis, in addition to left main and 3 vessel coronary artery disease. Assessment for potential risk factor modification, dietary therapy or pharmacologic therapy may be warranted, if clinically indicated. 4. 2  cavernous hemangiomas in the liver, as above. 5. Mild diffuse bronchial wall thickening with mild centrilobular and paraseptal emphysema; imaging findings suggestive of underlying COPD. Electronically Signed   By: Vinnie Langton M.D.   On: 04/03/2016 20:53     CBC  Recent Labs Lab 04/03/16 1724 04/04/16 0823 04/05/16 0329  WBC 7.0 6.2 7.2  HGB 11.4* 10.9* 10.9*  HCT 35.5* 35.0* 35.2*  PLT 329 289 307  MCV 89.6 91.1 92.1  MCH 28.8 28.4 28.5  MCHC 32.1 31.1 31.0  RDW 13.1 13.5 13.3    Chemistries   Recent Labs Lab 04/03/16 1724 04/04/16 0823 04/05/16 0329  NA 136 139 139  K 3.8 4.2 4.5  CL 103 105 104  CO2 '26 29 29  '$ GLUCOSE 96 93 87  BUN '11 8 10  '$ CREATININE 0.89 0.91 0.95  CALCIUM 9.2 9.1 9.8  AST  --   --  16  ALT  --   --  11*  ALKPHOS  --   --  59  BILITOT  --   --  0.4   ------------------------------------------------------------------------------------------------------------------ estimated creatinine clearance is 39.6 mL/min (by C-G  formula based on SCr of 0.95 mg/dL). ------------------------------------------------------------------------------------------------------------------  Recent Labs  04/04/16 0208 04/04/16 0823  HGBA1C 6.1* 5.9*   ------------------------------------------------------------------------------------------------------------------  Recent Labs  04/04/16 0823  CHOL 131  HDL 57  LDLCALC 66  TRIG 39  CHOLHDL 2.3   ------------------------------------------------------------------------------------------------------------------  Recent Labs  04/03/16 1718  TSH 0.161*   ------------------------------------------------------------------------------------------------------------------  Recent Labs  04/04/16 1231  VITAMINB12 123*    Coagulation profile No results for input(s): INR, PROTIME in the last 168 hours.  No results for input(s): DDIMER in the last 72 hours.  Cardiac Enzymes  Recent Labs Lab  04/04/16 0208 04/04/16 0823 04/04/16 1231  TROPONINI <0.03 <0.03 <0.03   ------------------------------------------------------------------------------------------------------------------ Invalid input(s): POCBNP   CBG:  Recent Labs Lab 04/04/16 1234 04/04/16 1645 04/04/16 2145 04/05/16 0732 04/05/16 1312  GLUCAP 105* 137* 158* 109* 171*       Studies: Dg Chest 2 View  Result Date: 04/03/2016 CLINICAL DATA:  Cough. EXAM: CHEST  2 VIEW COMPARISON:  March 05, 2016 FINDINGS: The heart size and mediastinal contours are within normal limits. Both lungs are clear. The visualized skeletal structures are unremarkable. IMPRESSION: No active cardiopulmonary disease. Electronically Signed   By: Dorise Bullion III M.D   On: 04/03/2016 17:51   Ct Head Wo Contrast  Result Date: 04/04/2016 CLINICAL DATA:  70 year old female with history of dizziness, numbness and tingling in both hands and in the right foot for the past 2 days. EXAM: CT HEAD WITHOUT CONTRAST TECHNIQUE: Contiguous axial images were obtained from the base of the skull through the vertex without intravenous contrast. COMPARISON:  Head CT 11/12/2015. FINDINGS: Patchy and confluent areas of decreased attenuation are noted throughout the deep and periventricular white matter of the cerebral hemispheres bilaterally, compatible with chronic microvascular ischemic disease. No acute intracranial abnormalities. Specifically, no evidence of acute intracranial hemorrhage, no definite findings of acute/subacute cerebral ischemia, no mass, mass effect, hydrocephalus or abnormal intra or extra-axial fluid collections. Visualized paranasal sinuses and right mastoid are well pneumatized. Small chronic left mastoid effusion, similar to the prior study. No acute displaced skull fractures are identified. IMPRESSION: 1. No acute intracranial abnormalities. 2. Chronic microvascular ischemic changes in the cerebral white matter redemonstrated, as above. 3.  Small chronic left mastoid effusion is unchanged. Electronically Signed   By: Vinnie Langton M.D.   On: 04/04/2016 13:42   Nm Myocar Multi W/spect W/wall Motion / Ef  Result Date: 04/05/2016  There was no ST segment deviation noted during stress.  No T wave inversion was noted during stress.  Defect 1: There is a small defect of moderate severity present in the mid inferior and apical inferior location. This appears to be artifactual due to overlying bowel activity. Cannot rule out ischemia.  This is a low risk study.  The left ventricular ejection fraction is normal (55-65%).    Ct Angio Chest Aorta W And/or Wo Contrast  Result Date: 04/03/2016 CLINICAL DATA:  70 year old female with history of constant central chest pressure radiating to the back. EXAM: CT ANGIOGRAPHY CHEST WITH CONTRAST TECHNIQUE: Multidetector CT imaging of the chest was performed using the standard protocol during bolus administration of intravenous contrast. Multiplanar CT image reconstructions and MIPs were obtained to evaluate the vascular anatomy. CONTRAST:  100 mL of Isovue 370. COMPARISON:  No priors. FINDINGS: Cardiovascular: Heart size is normal. There is no significant pericardial fluid, thickening or pericardial calcification. There is aortic atherosclerosis, as well as atherosclerosis of the great vessels of the mediastinum and the coronary  arteries, including calcified atherosclerotic plaque in the left main, left anterior descending, left circumflex and right coronary arteries. There is no aneurysm or dissection of the thoracic aorta. Additionally, on precontrast images, there is no crescentic high attenuation associated with the thoracic aorta suggests the presence of acute intramural hemorrhage. Additionally, although the examination was not specifically tailored to evaluate the pulmonary arteries, there is no evidence of central, lobar or segmental sized filling defect to suggest a presence of clinically relevant  pulmonary embolus. Mediastinum/Nodes: No pathologically enlarged mediastinal or hilar lymph nodes. Esophagus is unremarkable in appearance. 1 cm right thyroid lobe nodule is nonspecific. No axillary lymphadenopathy. Lungs/Pleura: Diffuse bronchial wall thickening with mild centrilobular and paraseptal emphysema, predominantly notable in the lung apices. No acute consolidative airspace disease. No pleural effusions. No suspicious appearing pulmonary nodules or masses. Upper Abdomen: Again noted is a 3.8 x 3.0 cm intermediate attenuation lesion in segment 7 of the liver which demonstrates peripheral nodular enhancement on the arterial phase examination, compatible with a cavernous hemangioma. There is a smaller lesion with similar imaging characteristics measuring 1.3 cm in segment 4A. Aortic atherosclerosis. Musculoskeletal: Old compression fracture of T4 with approximately 20% loss of anterior vertebral body height. There are no aggressive appearing lytic or blastic lesions noted in the visualized portions of the skeleton. Review of the MIP images confirms the above findings. IMPRESSION: 1. No evidence of acute aortic syndrome. 2. No acute findings in the thorax to account for the patient's symptoms. 3. Aortic atherosclerosis, in addition to left main and 3 vessel coronary artery disease. Assessment for potential risk factor modification, dietary therapy or pharmacologic therapy may be warranted, if clinically indicated. 4. 2 cavernous hemangiomas in the liver, as above. 5. Mild diffuse bronchial wall thickening with mild centrilobular and paraseptal emphysema; imaging findings suggestive of underlying COPD. Electronically Signed   By: Vinnie Langton M.D.   On: 04/03/2016 20:53      Lab Results  Component Value Date   HGBA1C 5.9 (H) 04/04/2016   HGBA1C 6.1 (H) 04/04/2016   Lab Results  Component Value Date   LDLCALC 66 04/04/2016   CREATININE 0.95 04/05/2016       Scheduled Meds: . ALPRAZolam   0.5 mg Oral BID  . aspirin EC  81 mg Oral Daily  . atorvastatin  80 mg Oral q1800  . clopidogrel  75 mg Oral Daily  . cyanocobalamin  1,000 mcg Intramuscular Daily  . insulin aspart  0-9 Units Subcutaneous TID WC  . ipratropium-albuterol  3 mL Nebulization TID  . mometasone-formoterol  2 puff Inhalation BID  . nicotine  21 mg Transdermal Daily  . pantoprazole  40 mg Oral Daily  . pentoxifylline  400 mg Oral Daily  . predniSONE  10 mg Oral Q breakfast  . regadenoson      . rOPINIRole  2 mg Oral QHS  . verapamil  180 mg Oral QHS   Continuous Infusions: . nitroGLYCERIN    . nitroGLYCERIN Stopped (04/04/16 0307)     LOS: 1 day    Time spent: >30 MINS    [facility]  [facility] Pager [phone]. If 7PM-7AM, please contact night-coverage at [email or web address], password [facility] 04/05/2016, 1:46 PM  LOS: 1 day

## 2016-04-05 NOTE — Progress Notes (Signed)
Initial Nutrition Assessment  DOCUMENTATION CODES:   Not applicable  INTERVENTION:   -RD will follow for diet advancement and supplement as appropriate  NUTRITION DIAGNOSIS:   Inadequate oral intake related to inability to eat as evidenced by NPO status.  GOAL:   Patient will meet greater than or equal to 90% of their needs  MONITOR:   Diet advancement, Labs, Weight trends, Skin, I & O's  REASON FOR ASSESSMENT:   Malnutrition Screening Tool    ASSESSMENT:   70 year old female with a significant past medical history of coronary artery disease status post percutaneous intervention with unknown coronary anatomy, non-insulin dependent diabetes, hypertension, hyperlipidemia and tobacco abuse who is here for chest pain.  Cardiology was consulted to evaluate patient for chest pain  Pt admitted with unstable angina.   Pt down for nuclear MPI study at time of visit. No family available to provide hx. Unable to complete Nutrition-Focused physical exam at this time.   Per chart review, pt has experienced a lot of stress and grief over the past few months secondary to the loss of her husband. Reviewed wt hx, which reveals a 25.7% wt loss over the past 10 months and a 22.3% wt loss over the past 5 months, which are significant time frame. Suspect wt loss may be related to decreased oral intake secondary to grieving process.   Pt currently NPO for procedure. Noted previous diet order was Heart Healthy. Meal completion 25%. Suspect poor oral intake PTA given social situation.   RD suspects malnutrition, however, unable to confirm at this time. Pt would likely benefit from supplements once diet is advanced.   Labs reviewed: CBGS: 109-158.   Diet Order:  Diet NPO time specified Except for: Sips with Meds  Skin:  Reviewed, no issues  Last BM:  04/03/16  Height:   Ht Readings from Last 1 Encounters:  04/03/16 5' 0.5" (1.537 m)    Weight:   Wt Readings from Last 1 Encounters:   04/04/16 101 lb 12.8 oz (46.2 kg)    Ideal Body Weight:  46.8 kg  BMI:  Body mass index is 19.55 kg/m.  Estimated Nutritional Needs:   Kcal:  1400-1600  Protein:  60-75 grams  Fluid:  1.4-1.6 L  EDUCATION NEEDS:   No education needs identified at this time  Caasi Giglia A. Jimmye Norman, RD, LDN, CDE Pager: [phone] After hours Pager: [phone]

## 2016-04-05 NOTE — Progress Notes (Signed)
Admission note:  Arrival Method: wheelchair  Mental Orientation: alert & oriented x 4  Telemetry: box #3 applied and CCMD notified and verified with Raj Janus  Assessment: completed  Skin: healed abrasion to right mid shin; assessed with Carolyne Littles  IV: right FA saline locked  Pain: pt denies; pt states "I feel much better" 6E Orientation: Patient has been oriented to the unit, staff and to the room.     Jacquelyne Quarry [employer], RN [employer] Phone [phone]

## 2016-04-05 NOTE — Progress Notes (Signed)
     The patient was seen in nuclear medicine for a Lexiscan myoview. She tolerated the procedure well. No acute ST or TW changes on ECG.  PERFUSION RESULTS TO FOLLOW.   Jettie Booze, NP

## 2016-04-05 NOTE — Progress Notes (Signed)
   04/05/16 0900  Clinical Encounter Type  Visited With Patient  Visit Type Initial  Referral From Chaplain;Other (Comment) (Admin. Sec.)  Spiritual Encounters  Spiritual Needs Emotional;Grief support  CHP visited with patient provided ministry of presence and listening.  Patient grieving some past losses. CHP available to follow up as needed. Roe Coombs 04/05/16

## 2016-04-06 DIAGNOSIS — I2510 Atherosclerotic heart disease of native coronary artery without angina pectoris: Secondary | ICD-10-CM

## 2016-04-06 LAB — COMPREHENSIVE METABOLIC PANEL
ALK PHOS: 51 U/L (ref 38–126)
ALT: 9 U/L — AB (ref 14–54)
AST: 13 U/L — AB (ref 15–41)
Albumin: 3 g/dL — ABNORMAL LOW (ref 3.5–5.0)
Anion gap: 7 (ref 5–15)
BUN: 17 mg/dL (ref 6–20)
CALCIUM: 9 mg/dL (ref 8.9–10.3)
CHLORIDE: 104 mmol/L (ref 101–111)
CO2: 28 mmol/L (ref 22–32)
CREATININE: 0.94 mg/dL (ref 0.44–1.00)
GFR, EST NON AFRICAN AMERICAN: 60 mL/min — AB (ref >60)
Glucose, Bld: 104 mg/dL — ABNORMAL HIGH (ref 65–99)
Potassium: 3.7 mmol/L (ref 3.5–5.1)
Sodium: 139 mmol/L (ref 135–145)
Total Bilirubin: 0.5 mg/dL (ref 0.3–1.2)
Total Protein: 5 g/dL — ABNORMAL LOW (ref 6.5–8.1)

## 2016-04-06 LAB — CBC
HCT: 31.9 % — ABNORMAL LOW (ref 36.0–46.0)
HEMOGLOBIN: 10 g/dL — AB (ref 12.0–15.0)
MCH: 28.8 pg (ref 26.0–34.0)
MCHC: 31.3 g/dL (ref 30.0–36.0)
MCV: 91.9 fL (ref 78.0–100.0)
PLATELETS: 285 10*3/uL (ref 150–400)
RBC: 3.47 MIL/uL — AB (ref 3.87–5.11)
RDW: 13.2 % (ref 11.5–15.5)
WBC: 6.9 10*3/uL (ref 4.0–10.5)

## 2016-04-06 LAB — GLUCOSE, CAPILLARY
GLUCOSE-CAPILLARY: 94 mg/dL (ref 65–99)
GLUCOSE-CAPILLARY: 134 mg/dL — AB (ref 65–99)

## 2016-04-06 MED ORDER — HYDROCODONE-ACETAMINOPHEN 5-325 MG PO TABS
1.0000 | ORAL_TABLET | Freq: Four times a day (QID) | ORAL | Status: DC | PRN
  Administered 2016-04-06: 2 via ORAL
  Filled 2016-04-06: qty 2

## 2016-04-06 MED ORDER — NICOTINE 21 MG/24HR TD PT24: 21.0000 mg | MEDICATED_PATCH | Freq: Every day | TRANSDERMAL | 0 refills | Status: DC

## 2016-04-06 MED ORDER — PANTOPRAZOLE SODIUM 40 MG PO TBEC: 40.0000 mg | DELAYED_RELEASE_TABLET | Freq: Every day | ORAL | 3 refills | Status: DC

## 2016-04-06 MED ORDER — IPRATROPIUM-ALBUTEROL 0.5-2.5 (3) MG/3ML IN SOLN: 3.0000 mL | Freq: Three times a day (TID) | RESPIRATORY_TRACT | 11 refills | Status: DC

## 2016-04-06 MED ORDER — CALCIUM CITRATE-VITAMIN D 250-100 MG-UNIT PO TABS: 1.0000 | ORAL_TABLET | Freq: Two times a day (BID) | ORAL | 2 refills | Status: DC

## 2016-04-06 MED ORDER — CYANOCOBALAMIN 1000 MCG/ML IJ SOLN: 1000.0000 ug | Freq: Every day | INTRAMUSCULAR | 0 refills | Status: AC

## 2016-04-06 MED ORDER — CYANOCOBALAMIN 2000 MCG PO TABS: 2000.0000 ug | ORAL_TABLET | Freq: Every day | ORAL | 0 refills | Status: DC

## 2016-04-06 NOTE — Progress Notes (Signed)
Patient ambulated in hall on room air and o2 sats were 98 to 100%. No complaints of shortness of breath.

## 2016-04-06 NOTE — Progress Notes (Signed)
Hospital Problem List     Principal Problem:   Unstable angina (HCC) Active Problems:   DM (diabetes mellitus), type 2 with renal complications (HCC)   Essential hypertension   CAD (coronary artery disease)   Tobacco abuse   Pain in the chest   COPD (chronic obstructive pulmonary disease) (HCC)   Hyperlipidemia   History of coronary artery stent placement    Patient Profile:   Primary Cardiologist: Dr. Gwenlyn Found    70 yo female w/ PMH of CAD s/p PCI (unknown artery), DM, HTN, HLD, and tobacco abuse who developed chest pressure and bilateral hand cramping on 04/03/2016 while resting in her pool.   Subjective   Reports having continued mild chest pressure since Saturday. Relieved with PO pain medications. Ambulating down the hallway this AM with no acute symptoms.   Inpatient Medications    . ALPRAZolam  0.5 mg Oral BID  . aspirin EC  81 mg Oral Daily  . atorvastatin  80 mg Oral q1800  . clopidogrel  75 mg Oral Daily  . cyanocobalamin  1,000 mcg Intramuscular Daily  . enoxaparin (LOVENOX) injection  40 mg Subcutaneous Q24H  . insulin aspart  0-9 Units Subcutaneous TID WC  . ipratropium-albuterol  3 mL Nebulization TID  . mometasone-formoterol  2 puff Inhalation BID  . nicotine  21 mg Transdermal Daily  . pantoprazole  40 mg Oral Daily  . pentoxifylline  400 mg Oral Daily  . predniSONE  10 mg Oral Q breakfast  . rOPINIRole  2 mg Oral QHS  . verapamil  180 mg Oral QHS    Vital Signs    Vitals:   04/05/16 2104 04/06/16 0418 04/06/16 0831 04/06/16 0914  BP:  (!) 100/43 139/71   Pulse:  (!) 57 71   Resp:  19 20   Temp:  98.1 F (36.7 C) 98.1 F (36.7 C)   TempSrc:  Oral Oral   SpO2: 94% 100% 100% 97%  Weight:      Height:        Intake/Output Summary (Last 24 hours) at 04/06/16 0942 Last data filed at 04/06/16 5284  Gross per 24 hour  Intake             1320 ml  Output              600 ml  Net              720 ml   Filed Weights   04/04/16 0049 04/04/16  0606 04/05/16 2035  Weight: 103 lb 4.8 oz (46.9 kg) 101 lb 12.8 oz (46.2 kg) 73 lb 6.6 oz (33.3 kg)    Physical Exam    General: Well developed, well nourished, female appearing in no acute distress. Head: Normocephalic, atraumatic.  Neck: Supple without bruits, JVD not elevated. Lungs:  Resp regular and unlabored, CTA without wheezing or rales. Heart: RRR, S1, S2, no S3, S4, or murmur; no rub. Abdomen: Soft, non-tender, non-distended with normoactive bowel sounds. No hepatomegaly. No rebound/guarding. No obvious abdominal masses. Extremities: No clubbing, cyanosis, or edema. Distal pedal pulses are 2+ bilaterally. Neuro: Alert and oriented X 3. Moves all extremities spontaneously. Psych: Normal affect.  Labs    CBC  Recent Labs  04/05/16 0329 04/06/16 0513  WBC 7.2 6.9  HGB 10.9* 10.0*  HCT 35.2* 31.9*  MCV 92.1 91.9  PLT 307 132   Basic Metabolic Panel  Recent Labs  04/05/16 0329 04/06/16 0513  NA 139 139  K  4.5 3.7  CL 104 104  CO2 29 28  GLUCOSE 87 104*  BUN 10 17  CREATININE 0.95 0.94  CALCIUM 9.8 9.0   Liver Function Tests  Recent Labs  04/05/16 0329 04/06/16 0513  AST 16 13*  ALT 11* 9*  ALKPHOS 59 51  BILITOT 0.4 0.5  PROT 5.3* 5.0*  ALBUMIN 3.2* 3.0*   No results for input(s): LIPASE, AMYLASE in the last 72 hours. Cardiac Enzymes  Recent Labs  04/04/16 0208 04/04/16 0823 04/04/16 1231  TROPONINI <0.03 <0.03 <0.03   BNP Invalid input(s): POCBNP D-Dimer No results for input(s): DDIMER in the last 72 hours. Hemoglobin A1C  Recent Labs  04/04/16 0823  HGBA1C 5.9*   Fasting Lipid Panel  Recent Labs  04/04/16 0823  CHOL 131  HDL 57  LDLCALC 66  TRIG 39  CHOLHDL 2.3   Thyroid Function Tests  Recent Labs  04/03/16 1718  TSH 0.161*    Telemetry    NSR, HR in 70's - 80's.   ECG    No new tracings.    Cardiac Studies and Radiology    Dg Chest 2 View  Result Date: 04/03/2016 CLINICAL DATA:  Cough. EXAM: CHEST  2  VIEW COMPARISON:  March 05, 2016 FINDINGS: The heart size and mediastinal contours are within normal limits. Both lungs are clear. The visualized skeletal structures are unremarkable. IMPRESSION: No active cardiopulmonary disease. Electronically Signed   By: Dorise Bullion III M.D   On: 04/03/2016 17:51   Ct Head Wo Contrast  Result Date: 04/04/2016 CLINICAL DATA:  70 year old female with history of dizziness, numbness and tingling in both hands and in the right foot for the past 2 days. EXAM: CT HEAD WITHOUT CONTRAST TECHNIQUE: Contiguous axial images were obtained from the base of the skull through the vertex without intravenous contrast. COMPARISON:  Head CT 11/12/2015. FINDINGS: Patchy and confluent areas of decreased attenuation are noted throughout the deep and periventricular white matter of the cerebral hemispheres bilaterally, compatible with chronic microvascular ischemic disease. No acute intracranial abnormalities. Specifically, no evidence of acute intracranial hemorrhage, no definite findings of acute/subacute cerebral ischemia, no mass, mass effect, hydrocephalus or abnormal intra or extra-axial fluid collections. Visualized paranasal sinuses and right mastoid are well pneumatized. Small chronic left mastoid effusion, similar to the prior study. No acute displaced skull fractures are identified. IMPRESSION: 1. No acute intracranial abnormalities. 2. Chronic microvascular ischemic changes in the cerebral white matter redemonstrated, as above. 3. Small chronic left mastoid effusion is unchanged. Electronically Signed   By: Vinnie Langton M.D.   On: 04/04/2016 13:42   Nm Myocar Multi W/spect W/wall Motion / Ef  Result Date: 04/05/2016  There was no ST segment deviation noted during stress.  No T wave inversion was noted during stress.  Defect 1: There is a small defect of moderate severity present in the mid inferior and apical inferior location. This appears to be artifactual due to overlying  bowel activity. Cannot rule out ischemia.  This is a low risk study.  The left ventricular ejection fraction is normal (55-65%).    Ct Angio Chest Aorta W And/or Wo Contrast  Result Date: 04/03/2016 CLINICAL DATA:  70 year old female with history of constant central chest pressure radiating to the back. EXAM: CT ANGIOGRAPHY CHEST WITH CONTRAST TECHNIQUE: Multidetector CT imaging of the chest was performed using the standard protocol during bolus administration of intravenous contrast. Multiplanar CT image reconstructions and MIPs were obtained to evaluate the vascular anatomy. CONTRAST:  100 mL of Isovue 370. COMPARISON:  No priors. FINDINGS: Cardiovascular: Heart size is normal. There is no significant pericardial fluid, thickening or pericardial calcification. There is aortic atherosclerosis, as well as atherosclerosis of the great vessels of the mediastinum and the coronary arteries, including calcified atherosclerotic plaque in the left main, left anterior descending, left circumflex and right coronary arteries. There is no aneurysm or dissection of the thoracic aorta. Additionally, on precontrast images, there is no crescentic high attenuation associated with the thoracic aorta suggests the presence of acute intramural hemorrhage. Additionally, although the examination was not specifically tailored to evaluate the pulmonary arteries, there is no evidence of central, lobar or segmental sized filling defect to suggest a presence of clinically relevant pulmonary embolus. Mediastinum/Nodes: No pathologically enlarged mediastinal or hilar lymph nodes. Esophagus is unremarkable in appearance. 1 cm right thyroid lobe nodule is nonspecific. No axillary lymphadenopathy. Lungs/Pleura: Diffuse bronchial wall thickening with mild centrilobular and paraseptal emphysema, predominantly notable in the lung apices. No acute consolidative airspace disease. No pleural effusions. No suspicious appearing pulmonary nodules or  masses. Upper Abdomen: Again noted is a 3.8 x 3.0 cm intermediate attenuation lesion in segment 7 of the liver which demonstrates peripheral nodular enhancement on the arterial phase examination, compatible with a cavernous hemangioma. There is a smaller lesion with similar imaging characteristics measuring 1.3 cm in segment 4A. Aortic atherosclerosis. Musculoskeletal: Old compression fracture of T4 with approximately 20% loss of anterior vertebral body height. There are no aggressive appearing lytic or blastic lesions noted in the visualized portions of the skeleton. Review of the MIP images confirms the above findings. IMPRESSION: 1. No evidence of acute aortic syndrome. 2. No acute findings in the thorax to account for the patient's symptoms. 3. Aortic atherosclerosis, in addition to left main and 3 vessel coronary artery disease. Assessment for potential risk factor modification, dietary therapy or pharmacologic therapy may be warranted, if clinically indicated. 4. 2 cavernous hemangiomas in the liver, as above. 5. Mild diffuse bronchial wall thickening with mild centrilobular and paraseptal emphysema; imaging findings suggestive of underlying COPD. Electronically Signed   By: Vinnie Langton M.D.   On: 04/03/2016 20:53    Echocardiogram: 04/04/2016 Study Conclusions  - Left ventricle: The cavity size was normal. There was mild focal   basal hypertrophy of the septum. Systolic function was mildly to   moderately reduced. The estimated ejection fraction was in the   range of 40% to 45%. Diffuse hypokinesis. Doppler parameters are   consistent with abnormal left ventricular relaxation (grade 1   diastolic dysfunction). - Aortic valve: There was trivial regurgitation.  Impressions:  - Mild to moderate global reduction in LV function; grade 1   diastolic dysfunction; trace AI, trace MR and TR.  Assessment & Plan    1. Chest pain in context of CAD and prior stent - reports having constant  chest pressure since Saturday, no association with exertion. Ambulated multiple times in the hallway today without acute symptoms. Pain improved with PO pain medication. - cyclic troponin values this admission have been negative and EKG without acute ischemic changes.  - CT on admission showed aortic atherosclerosis, in addition to left main and 3 vessel coronary artery disease. Echo with EF of 40-45% with diffuse HK (no prior imaging available for comparison). NST on 04/05/2016 showed a small defect of moderate severity present in the mid inferior and apical inferior location that appeared to be artifactual due to overlying bowel activity. Cannot rule out ischemia. EF 50-65%. Overall,  the study was low-risk. - bradycardic and hypotensive this admission, therefore Metoprolol and Avalide were discontinued. Continue ASA, Statin, and Plavix.  - if her pain does not resolve or she develops exertional symptoms, would have a low-threshold to pursue a cardiac catheterization with her reduced EF and significant coronary calcifications noted on CT. Will arrange for 2-week hospital follow-up at the [facility] office.   2. Essential HTN - has been hypotensive this admission, BP improved to 100/43 - 139/99 over the past 24 hours. Continue Verapamil.   3. Tobacco Use - cessation advised.  Arna Medici , PA-C 9:42 AM 04/06/2016 Pager: [phone]   I have examined the patient and reviewed assessment and plan and discussed with patient.  Agree with above as stated.  No CP with walking.  SHe was not worried about her heart when she admitted. She will let us know if she has any sx like she had before her stent.  She is concerned about B12 deficiency.  OK to discharge from a cardiac standpoint. F/u with Dr. Gwenlyn Found.  If more typical sx occur, could consider angiogram.  Larae Grooms

## 2016-04-06 NOTE — Discharge Summary (Addendum)
Physician Discharge Summary  Tanika Bracco MRN: [id number] DOB/AGE: 71-Aug-1947 71 y.o.  PCP: Corine Shelter, PA-C   Admit date: 04/03/2016 Discharge date: 04/06/2016  Discharge Diagnoses:    Principal Problem:   Unstable angina ([place]) Active Problems:   DM (diabetes mellitus), type 2 with renal complications (HCC)   Essential hypertension   CAD (coronary artery disease)   Tobacco abuse   Pain in the chest   COPD (chronic obstructive pulmonary disease) (HCC)   Hyperlipidemia   History of coronary artery stent placement Vitamin B-12 deficiency   Follow-up recommendations Follow-up with PCP in 3-5 days , including all  additional recommended appointments as below Follow-up CBC, CMP in 3-5 days Recommend outpatient scan  DEXA  and vitamin D levels Outpatient vitamin B-12, TSH and free T4 in the next 4-6 weeks      Current Discharge Medication List    START taking these medications   Details  calcium-vitamin D 250-100 MG-UNIT tablet Take 1 tablet by mouth 2 (two) times daily. Qty: 60 tablet, Refills: 2    cyanocobalamin (,VITAMIN B-12,) 1000 MCG/ML injection Inject 1 mL (1,000 mcg total) into the muscle daily. Qty: 1 mL, Refills: 0    cyanocobalamin ([facility] VITAMIN B12) 2000 MCG tablet Take 1 tablet (2,000 mcg total) by mouth daily. Qty: 120 tablet, Refills: 0    nicotine (NICODERM CQ - DOSED IN MG/24 HOURS) 21 mg/24hr patch Place 1 patch (21 mg total) onto the skin daily. Qty: 28 patch, Refills: 0    pantoprazole (PROTONIX) 40 MG tablet Take 1 tablet (40 mg total) by mouth daily. Qty: 30 tablet, Refills: 3      CONTINUE these medications which have NOT CHANGED   Details  ALPRAZolam (XANAX) 0.5 MG tablet Take 0.5 mg by mouth 2 (two) times daily.     aspirin EC 81 MG tablet Take 81 mg by mouth daily.     atorvastatin (LIPITOR) 10 MG tablet Take 10 mg by mouth at bedtime.     budesonide-formoterol (SYMBICORT) 160-4.5 MCG/ACT inhaler Inhale 2 puffs into the lungs 2 (two)  times daily.    clopidogrel (PLAVIX) 75 MG tablet Take 75 mg by mouth daily.    pentoxifylline (TRENTAL) 400 MG CR tablet Take 400 mg by mouth daily.    potassium chloride (K-DUR) 10 MEQ tablet Take 10 mEq by mouth 2 (two) times daily.  Refills: 0    predniSONE (DELTASONE) 10 MG tablet Take 10 mg by mouth daily with breakfast.    RABEprazole (ACIPHEX) 20 MG tablet Take 20 mg by mouth daily.    rOPINIRole (REQUIP) 2 MG tablet Take 2 mg by mouth at bedtime.    sitaGLIPtin (JANUVIA) 100 MG tablet Take 100 mg by mouth daily.    traMADol (ULTRAM) 50 MG tablet Take 50-100 mg by mouth every 6 (six) hours as needed (for headaches).    verapamil (VERELAN PM) 180 MG 24 hr capsule Take 180 mg by mouth at bedtime.      STOP taking these medications     irbesartan-hydrochlorothiazide (AVALIDE) 150-12.5 MG tablet          Discharge Condition Stable  harge Instructions Get Medicines reviewed and adjusted: Please take all your medications with you for your next visit with your Primary MD  Please request your Primary MD to go over all hospital tests and procedure/radiological results at the follow up, please ask your Primary MD to get all Hospital records sent to his/her office.  If you experience worsening of your admission  symptoms, develop shortness of breath, life threatening emergency, suicidal or homicidal thoughts you must seek medical attention immediately by calling 911 or calling your MD immediately if symptoms less severe.  You must read complete instructions/literature along with all the possible adverse reactions/side effects for all the Medicines you take and that have been prescribed to you. Take any new Medicines after you have completely understood and accpet all the possible adverse reactions/side effects.   Do not drive when taking Pain medications.   Do not take more than prescribed Pain, Sleep and Anxiety Medications  Special Instructions: If you have smoked or  chewed Tobacco in the last 2 yrs please stop smoking, stop any regular Alcohol and or any Recreational drug use.  Wear Seat belts while driving.  Please note  You were cared for by a hospitalist during your hospital stay. Once you are discharged, your primary care physician will handle any further medical issues. Please note that NO REFILLS for any discharge medications will be authorized once you are discharged, as it is imperative that you return to your primary care physician (or establish a relationship with a primary care physician if you do not have one) for your aftercare needs so that they can reassess your need for medications and monitor your lab values.     Allergies  Allergen Reactions  . Dicyclomine Other (See Comments)    Reaction:  Agitation   . Morphine And Related Nausea And Vomiting  . Nsaids Nausea And Vomiting  . Toradol [Ketorolac Tromethamine] Nausea And Vomiting  . Levaquin [Levofloxacin] Nausea And Vomiting and Rash  . Metronidazole Nausea And Vomiting and Rash      Disposition: 01-Home or Self Care   Consults:  Cardiology   icant Diagnostic Studies:  Dg Chest 2 View  Result Date: 04/03/2016 CLINICAL DATA:  Cough. EXAM: CHEST  2 VIEW COMPARISON:  March 05, 2016 FINDINGS: The heart size and mediastinal contours are within normal limits. Both lungs are clear. The visualized skeletal structures are unremarkable. IMPRESSION: No active cardiopulmonary disease. Electronically Signed   By: Dorise Bullion III M.D   On: 04/03/2016 17:51   Ct Head Wo Contrast  Result Date: 04/04/2016 CLINICAL DATA:  71 year old female with history of dizziness, numbness and tingling in both hands and in the right foot for the past 2 days. EXAM: CT HEAD WITHOUT CONTRAST TECHNIQUE: Contiguous axial images were obtained from the base of the skull through the vertex without intravenous contrast. COMPARISON:  Head CT 11/12/2015. FINDINGS: Patchy and confluent areas of decreased  attenuation are noted throughout the deep and periventricular white matter of the cerebral hemispheres bilaterally, compatible with chronic microvascular ischemic disease. No acute intracranial abnormalities. Specifically, no evidence of acute intracranial hemorrhage, no definite findings of acute/subacute cerebral ischemia, no mass, mass effect, hydrocephalus or abnormal intra or extra-axial fluid collections. Visualized paranasal sinuses and right mastoid are well pneumatized. Small chronic left mastoid effusion, similar to the prior study. No acute displaced skull fractures are identified. IMPRESSION: 1. No acute intracranial abnormalities. 2. Chronic microvascular ischemic changes in the cerebral white matter redemonstrated, as above. 3. Small chronic left mastoid effusion is unchanged. Electronically Signed   By: Vinnie Langton M.D.   On: 04/04/2016 13:42   Nm Myocar Multi W/spect W/wall Motion / Ef  Result Date: 04/05/2016  There was no ST segment deviation noted during stress.  No T wave inversion was noted during stress.  Defect 1: There is a small defect of moderate severity present in  the mid inferior and apical inferior location. This appears to be artifactual due to overlying bowel activity. Cannot rule out ischemia.  This is a low risk study.  The left ventricular ejection fraction is normal (55-65%).    Ct Angio Chest Aorta W And/or Wo Contrast  Result Date: 04/03/2016 CLINICAL DATA:  70 year old female with history of constant central chest pressure radiating to the back. EXAM: CT ANGIOGRAPHY CHEST WITH CONTRAST TECHNIQUE: Multidetector CT imaging of the chest was performed using the standard protocol during bolus administration of intravenous contrast. Multiplanar CT image reconstructions and MIPs were obtained to evaluate the vascular anatomy. CONTRAST:  100 mL of Isovue 370. COMPARISON:  No priors. FINDINGS: Cardiovascular: Heart size is normal. There is no significant pericardial  fluid, thickening or pericardial calcification. There is aortic atherosclerosis, as well as atherosclerosis of the great vessels of the mediastinum and the coronary arteries, including calcified atherosclerotic plaque in the left main, left anterior descending, left circumflex and right coronary arteries. There is no aneurysm or dissection of the thoracic aorta. Additionally, on precontrast images, there is no crescentic high attenuation associated with the thoracic aorta suggests the presence of acute intramural hemorrhage. Additionally, although the examination was not specifically tailored to evaluate the pulmonary arteries, there is no evidence of central, lobar or segmental sized filling defect to suggest a presence of clinically relevant pulmonary embolus. Mediastinum/Nodes: No pathologically enlarged mediastinal or hilar lymph nodes. Esophagus is unremarkable in appearance. 1 cm right thyroid lobe nodule is nonspecific. No axillary lymphadenopathy. Lungs/Pleura: Diffuse bronchial wall thickening with mild centrilobular and paraseptal emphysema, predominantly notable in the lung apices. No acute consolidative airspace disease. No pleural effusions. No suspicious appearing pulmonary nodules or masses. Upper Abdomen: Again noted is a 3.8 x 3.0 cm intermediate attenuation lesion in segment 7 of the liver which demonstrates peripheral nodular enhancement on the arterial phase examination, compatible with a cavernous hemangioma. There is a smaller lesion with similar imaging characteristics measuring 1.3 cm in segment 4A. Aortic atherosclerosis. Musculoskeletal: Old compression fracture of T4 with approximately 20% loss of anterior vertebral body height. There are no aggressive appearing lytic or blastic lesions noted in the visualized portions of the skeleton. Review of the MIP images confirms the above findings. IMPRESSION: 1. No evidence of acute aortic syndrome. 2. No acute findings in the thorax to account for  the patient's symptoms. 3. Aortic atherosclerosis, in addition to left main and 3 vessel coronary artery disease. Assessment for potential risk factor modification, dietary therapy or pharmacologic therapy may be warranted, if clinically indicated. 4. 2 cavernous hemangiomas in the liver, as above. 5. Mild diffuse bronchial wall thickening with mild centrilobular and paraseptal emphysema; imaging findings suggestive of underlying COPD. Electronically Signed   By: Vinnie Langton M.D.   On: 04/03/2016 20:53   2-D echoLV EF: 40% -   45%  ------------------------------------------------------------------- Indications:      Chest pain 786.51.  ------------------------------------------------------------------- History:   PMH:   Coronary artery disease.  Chronic obstructive pulmonary disease.  Risk factors:  Current tobacco use. Hypertension. Diabetes mellitus.  ------------------------------------------------------------------- Study Conclusions  - Left ventricle: The cavity size was normal. There was mild focal   basal hypertrophy of the septum. Systolic function was mildly to   moderately reduced. The estimated ejection fraction was in the   range of 40% to 45%. Diffuse hypokinesis. Doppler parameters are   consistent with abnormal left ventricular relaxation (grade 1   diastolic dysfunction). - Aortic valve: There was trivial regurgitation.  Impressions:  - Mild to moderate global reduction in LV function; grade 1   diastolic dysfunction; trace AI, trace MR and TR.   Nuclear Stress test   There was no ST segment deviation noted during stress.  No T wave inversion was noted during stress.  Defect 1: There is a small defect of moderate severity present in the mid inferior and apical inferior location. This appears to be artifactual due to overlying bowel activity. Cannot rule out ischemia.  This is a low risk study.  The left ventricular ejection fraction is normal  (55-65%).  Filed Weights   04/04/16 0049 04/04/16 0606 04/05/16 2035  Weight: 46.9 kg (103 lb 4.8 oz) 46.2 kg (101 lb 12.8 oz) 33.3 kg (73 lb 6.6 oz)     Microbiology: Recent Results (from the past 240 hour(s))  MRSA PCR Screening     Status: None   Collection Time: 04/04/16 12:35 AM  Result Value Ref Range Status   MRSA by PCR NEGATIVE NEGATIVE Final    Comment:        The GeneXpert MRSA Assay (FDA approved for NASAL specimens only), is one component of a comprehensive MRSA colonization surveillance program. It is not intended to diagnose MRSA infection nor to guide or monitor treatment for MRSA infections.        Blood Culture    Component Value Date/Time   SDES STOOL 05/25/2015 1421   SPECREQUEST NONE 05/25/2015 1421   CULT  05/25/2015 1421    NO SALMONELLA, SHIGELLA, CAMPYLOBACTER, YERSINIA, OR E.COLI 0157:H7 ISOLATED Performed at [place] 05/29/2015 FINAL 05/25/2015 1421      Labs: Results for orders placed or performed during the hospital encounter of 04/03/16 (from the past 48 hour(s))  Troponin I     Status: None   Collection Time: 04/04/16 12:31 PM  Result Value Ref Range   Troponin I <0.03 <0.03 ng/mL  Vitamin B12     Status: Abnormal   Collection Time: 04/04/16 12:31 PM  Result Value Ref Range   Vitamin B-12 123 (L) 180 - 914 pg/mL    Comment: (NOTE) This assay is not validated for testing neonatal or myeloproliferative syndrome specimens for Vitamin B12 levels.   Glucose, capillary     Status: Abnormal   Collection Time: 04/04/16 12:34 PM  Result Value Ref Range   Glucose-Capillary 105 (H) 65 - 99 mg/dL   Comment 1 Notify RN   Glucose, capillary     Status: Abnormal   Collection Time: 04/04/16  4:45 PM  Result Value Ref Range   Glucose-Capillary 137 (H) 65 - 99 mg/dL   Comment 1 Notify RN   Glucose, capillary     Status: Abnormal   Collection Time: 04/04/16  9:45 PM  Result Value Ref Range   Glucose-Capillary  158 (H) 65 - 99 mg/dL   Comment 1 Capillary Specimen   CBC     Status: Abnormal   Collection Time: 04/05/16  3:29 AM  Result Value Ref Range   WBC 7.2 4.0 - 10.5 K/uL   RBC 3.82 (L) 3.87 - 5.11 MIL/uL   Hemoglobin 10.9 (L) 12.0 - 15.0 g/dL   HCT 35.2 (L) 36.0 - 46.0 %   MCV 92.1 78.0 - 100.0 fL   MCH 28.5 26.0 - 34.0 pg   MCHC 31.0 30.0 - 36.0 g/dL   RDW 13.3 11.5 - 15.5 %   Platelets 307 150 - 400 K/uL  Comprehensive metabolic panel  Status: Abnormal   Collection Time: 04/05/16  3:29 AM  Result Value Ref Range   Sodium 139 135 - 145 mmol/L   Potassium 4.5 3.5 - 5.1 mmol/L   Chloride 104 101 - 111 mmol/L   CO2 29 22 - 32 mmol/L   Glucose, Bld 87 65 - 99 mg/dL   BUN 10 6 - 20 mg/dL   Creatinine, Ser 0.95 0.44 - 1.00 mg/dL   Calcium 9.8 8.9 - 10.3 mg/dL   Total Protein 5.3 (L) 6.5 - 8.1 g/dL   Albumin 3.2 (L) 3.5 - 5.0 g/dL   AST 16 15 - 41 U/L   ALT 11 (L) 14 - 54 U/L   Alkaline Phosphatase 59 38 - 126 U/L   Total Bilirubin 0.4 0.3 - 1.2 mg/dL   GFR calc non Af Amer 59 (L) >60 mL/min   GFR calc Af Amer >60 >60 mL/min    Comment: (NOTE) The eGFR has been calculated using the CKD EPI equation. This calculation has not been validated in all clinical situations. eGFR's persistently <60 mL/min signify possible Chronic Kidney Disease.    Anion gap 6 5 - 15  Glucose, capillary     Status: Abnormal   Collection Time: 04/05/16  7:32 AM  Result Value Ref Range   Glucose-Capillary 109 (H) 65 - 99 mg/dL   Comment 1 Capillary Specimen    Comment 2 Notify RN   Glucose, capillary     Status: Abnormal   Collection Time: 04/05/16  1:12 PM  Result Value Ref Range   Glucose-Capillary 171 (H) 65 - 99 mg/dL   Comment 1 Capillary Specimen    Comment 2 Notify RN   T4, free     Status: None   Collection Time: 04/05/16  2:47 PM  Result Value Ref Range   Free T4 0.92 0.61 - 1.12 ng/dL    Comment: (NOTE) Biotin ingestion may interfere with free T4 tests. If the results  are inconsistent with the TSH level, previous test results, or the clinical presentation, then consider biotin interference. If needed, order repeat testing after stopping biotin.   Glucose, capillary     Status: Abnormal   Collection Time: 04/05/16  4:27 PM  Result Value Ref Range   Glucose-Capillary 162 (H) 65 - 99 mg/dL   Comment 1 Capillary Specimen    Comment 2 Notify RN   Glucose, capillary     Status: None   Collection Time: 04/05/16  8:34 PM  Result Value Ref Range   Glucose-Capillary 82 65 - 99 mg/dL  CBC     Status: Abnormal   Collection Time: 04/06/16  5:13 AM  Result Value Ref Range   WBC 6.9 4.0 - 10.5 K/uL   RBC 3.47 (L) 3.87 - 5.11 MIL/uL   Hemoglobin 10.0 (L) 12.0 - 15.0 g/dL   HCT 31.9 (L) 36.0 - 46.0 %   MCV 91.9 78.0 - 100.0 fL   MCH 28.8 26.0 - 34.0 pg   MCHC 31.3 30.0 - 36.0 g/dL   RDW 13.2 11.5 - 15.5 %   Platelets 285 150 - 400 K/uL  Comprehensive metabolic panel     Status: Abnormal   Collection Time: 04/06/16  5:13 AM  Result Value Ref Range   Sodium 139 135 - 145 mmol/L   Potassium 3.7 3.5 - 5.1 mmol/L   Chloride 104 101 - 111 mmol/L   CO2 28 22 - 32 mmol/L   Glucose, Bld 104 (H) 65 - 99 mg/dL  BUN 17 6 - 20 mg/dL   Creatinine, Ser 0.94 0.44 - 1.00 mg/dL   Calcium 9.0 8.9 - 10.3 mg/dL   Total Protein 5.0 (L) 6.5 - 8.1 g/dL   Albumin 3.0 (L) 3.5 - 5.0 g/dL   AST 13 (L) 15 - 41 U/L   ALT 9 (L) 14 - 54 U/L   Alkaline Phosphatase 51 38 - 126 U/L   Total Bilirubin 0.5 0.3 - 1.2 mg/dL   GFR calc non Af Amer 60 (L) >60 mL/min   GFR calc Af Amer >60 >60 mL/min    Comment: (NOTE) The eGFR has been calculated using the CKD EPI equation. This calculation has not been validated in all clinical situations. eGFR's persistently <60 mL/min signify possible Chronic Kidney Disease.    Anion gap 7 5 - 15  Glucose, capillary     Status: None   Collection Time: 04/06/16  7:56 AM  Result Value Ref Range   Glucose-Capillary 94 65 - 99 mg/dL     Lipid  Panel     Component Value Date/Time   CHOL 131 04/04/2016 0823   TRIG 39 04/04/2016 0823   HDL 57 04/04/2016 0823   CHOLHDL 2.3 04/04/2016 0823   VLDL 8 04/04/2016 0823   LDLCALC 66 04/04/2016 0823     Lab Results  Component Value Date   HGBA1C 5.9 (H) 04/04/2016   HGBA1C 6.1 (H) 04/04/2016     Lab Results  Component Value Date   LDLCALC 66 04/04/2016   CREATININE 0.94 04/06/2016     HPI :    70 y.o. female with medical history significant of ASCVD s/p stent placement and abdominal aorta angiogram with 50% stenosis per patient report with h/o DM, HTN, HLD.  Patient noticed left hand cramping, spasming 2 days ago; started in right hand as well.  This AM, went to store to buy cigarettes and sat down to play on gambling machine and feet started locking up.   presented with constantly, pressure and heaviness that seems to be getting worse.   Chest pain getting worse and so came to ER instead.     Saw PCP over the winter before she left [place].  Remote h/o last stress test.  Had stent in 2009.  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 Gwenlyn Found.    ED Course:  Chest pain, substernal and with sharp pain radiating into her back; CT to r/o aortic dissection negative except for multivessel CAD.  Very high risk for ACS with known CAD and multiple CVD RF.  First troponin and EKG negative.  Given ASA, consulted hospitalist, recommend transfer to [facility] SDU for persistent CP and cardiology consultation.  HOSPITAL COURSE:    Chest pain in context of CAD and prior stent  Patient is hemodynamically stable and ECG is without any signs of myocardial ischemia/infarction. Troponin levels have all been within normal range -She has a known h/o CAD s/p 1 stent 9 years ago and with apparent CAD visualized on CT today -Positive FH with her  father and brother both dying of "massive" MIs -+DM, HTN, HLD, tobacco -With this strong history and persistent CP,  cardiology was consulted and recommended a nuclear   stress test   Initially placed on heparin and nitroglycerin drip, these were discontinued within 48 hours,  continue aspirin and Plavix, -morphine, oxygen, lipitor, dc  beta blocker, avalide due to low BP in the 50s and 60s, continue verapamil -  FLP -LDL 66 and HgbA1c 5.9 ;   TSH 0.161, normal T4   -Encourage smoking cessation -started patient on nicotine patch -Continue Trental CT of the chest shows no PE, no dissection, emphysema,There was no ST segment deviation noted during stress.No T wave inversion was noted during stress.  nuclear images. There is a small defect of moderate severity present in the mid inferior and apical inferior location. This appears to be artifactual due to overlying bowel activity. Cannot rule out ischemia. 2-D echo shows EF of 40-45% Cardiology evaluated the patient prior to discharge. They will arrange for outpatient follow-up  DM Resume home medication   hemoglobin A1c 5.9  HTN -hold  Avalide and continue  Verapamil, will continue    Tobacco dependence -Encourage cessation. This was discussed with the patient and should be reviewed on an ongoing basis.  Continue nicotine patch  COPD -Continue Symbicort and Prednisone for now, patient chronically on prednisone and needs to be evaluated for osteoporosis -Duonebs q6h with albuterol prn   Numbness and tingling-suspect peripheral neuropathy in the setting of vascular issues/ongoing smoking CT of the head to rule out CVA/TIA did not show any acute intracranial abnormality, no gross focal symptoms on exam today   vitamin B 12 -123, started B12 injections , sublingual B-12   Discharge Exam:   Blood pressure (P) 139/71, pulse (P) 71, temperature (P) 98.1 F (36.7 C), temperature source (P) Oral, resp. rate (P) 20, height 5' 0.5"  (1.537 m), weight 33.3 kg (73 lb 6.6 oz), SpO2 (P) 100 %.  General: Well developed, well nourished, female in no acute distress. Head: Normocephalic, atraumatic.                Neck: Supple without bruits, no JVD. Lungs:  Resp regular and unlabored, CTA. Heart: RRR, S1, S2, no S3, S4, or murmur; no rub. Abdomen: Soft, non-tender, non-distended, BS + x 4.  Extremities: No clubbing, cyanosis, no edema.  Neuro: Alert and oriented X 3. Moves all extremities spontaneously. Psych: Normal affect    Follow-up Information    HEPLER,MARK, PA-C. Schedule an appointment as soon as possible for a visit in 1 week(s).   Specialty:  Physician Assistant Why:  Please call to make this appointment as soon as possible, hospital follow-up Also requests PCP for osteoporosis evaluation Contact information: [place] [address] [phone]           Signed: Reyne Dumas 04/06/2016, 9:02 AM        Time spent >45 mins

## 2016-04-07 DIAGNOSIS — E538 Deficiency of other specified B group vitamins: Secondary | ICD-10-CM | POA: Diagnosis not present

## 2016-04-07 DIAGNOSIS — I119 Hypertensive heart disease without heart failure: Secondary | ICD-10-CM | POA: Diagnosis not present

## 2016-04-07 DIAGNOSIS — M353 Polymyalgia rheumatica: Secondary | ICD-10-CM | POA: Diagnosis not present

## 2016-04-07 DIAGNOSIS — D649 Anemia, unspecified: Secondary | ICD-10-CM | POA: Diagnosis not present

## 2016-04-23 ENCOUNTER — Ambulatory Visit (INDEPENDENT_AMBULATORY_CARE_PROVIDER_SITE_OTHER): Payer: Medicare Other | Admitting: Physician Assistant

## 2016-04-23 ENCOUNTER — Encounter: Payer: Self-pay | Admitting: Physician Assistant

## 2016-04-23 VITALS — BP 126/70 | HR 84 | Ht 60.5 in | Wt 101.4 lb

## 2016-04-23 DIAGNOSIS — I10 Essential (primary) hypertension: Secondary | ICD-10-CM

## 2016-04-23 DIAGNOSIS — I25118 Atherosclerotic heart disease of native coronary artery with other forms of angina pectoris: Secondary | ICD-10-CM

## 2016-04-23 DIAGNOSIS — E118 Type 2 diabetes mellitus with unspecified complications: Secondary | ICD-10-CM | POA: Diagnosis not present

## 2016-04-23 DIAGNOSIS — R0789 Other chest pain: Secondary | ICD-10-CM

## 2016-04-23 DIAGNOSIS — E538 Deficiency of other specified B group vitamins: Secondary | ICD-10-CM | POA: Diagnosis not present

## 2016-04-23 DIAGNOSIS — Z720 Tobacco use: Secondary | ICD-10-CM

## 2016-04-23 DIAGNOSIS — E785 Hyperlipidemia, unspecified: Secondary | ICD-10-CM

## 2016-04-23 DIAGNOSIS — I119 Hypertensive heart disease without heart failure: Secondary | ICD-10-CM | POA: Diagnosis not present

## 2016-04-23 DIAGNOSIS — D649 Anemia, unspecified: Secondary | ICD-10-CM | POA: Diagnosis not present

## 2016-04-23 MED ORDER — ISOSORBIDE MONONITRATE ER 30 MG PO TB24: 30.0000 mg | ORAL_TABLET | Freq: Every day | ORAL | 6 refills | Status: DC
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
## 2016-04-23 NOTE — Patient Instructions (Signed)
Your provider has recommended you make the following change in your medication: restart the isosorbide 30 mg daily. A new prescription has been sent to your pharmacy.   Your physician recommends that you schedule a follow-up appointment in: 3 months with Dr Gwenlyn Found.

## 2016-05-07 DIAGNOSIS — Z23 Encounter for immunization: Secondary | ICD-10-CM | POA: Diagnosis not present

## 2016-05-07 DIAGNOSIS — E538 Deficiency of other specified B group vitamins: Secondary | ICD-10-CM | POA: Diagnosis not present

## 2016-05-21 DIAGNOSIS — E538 Deficiency of other specified B group vitamins: Secondary | ICD-10-CM | POA: Diagnosis not present

## 2016-05-24 DIAGNOSIS — R42 Dizziness and giddiness: Secondary | ICD-10-CM | POA: Diagnosis not present

## 2016-05-24 DIAGNOSIS — R252 Cramp and spasm: Secondary | ICD-10-CM | POA: Diagnosis not present

## 2016-05-24 DIAGNOSIS — R05 Cough: Secondary | ICD-10-CM | POA: Diagnosis not present

## 2016-05-24 DIAGNOSIS — I6200 Nontraumatic subdural hemorrhage, unspecified: Secondary | ICD-10-CM | POA: Diagnosis not present

## 2016-05-24 DIAGNOSIS — R1110 Vomiting, unspecified: Secondary | ICD-10-CM | POA: Diagnosis not present

## 2016-05-26 ENCOUNTER — Telehealth: Payer: Self-pay | Admitting: Neurology

## 2016-05-26 NOTE — Telephone Encounter (Signed)
LVM for pt. Advised per Dr Jaynee Eagles, she should f/u with PCP for new symptoms. She can still f/u if they feel it is neurologic. Asked her to call back to ensure she received message. Gave [facility] phone number.

## 2016-05-26 NOTE — Telephone Encounter (Signed)
Patient reports new pain in toes and feet with drawing sensation up to the calf of the leg and pain in left hand and arm with "locking of the hand" that started around the first of August.  An appointment was made for 06/23/16.  Advised that the nurse would call if there was any other questions.

## 2016-05-26 NOTE — Telephone Encounter (Signed)
Per Dr Jaynee Eagles- she recommends patient see PCP for these new sx as well prior to appt.

## 2016-05-27 ENCOUNTER — Other Ambulatory Visit: Payer: Self-pay | Admitting: Physician Assistant

## 2016-05-27 DIAGNOSIS — R1111 Vomiting without nausea: Secondary | ICD-10-CM

## 2016-05-27 DIAGNOSIS — R05 Cough: Secondary | ICD-10-CM

## 2016-05-27 DIAGNOSIS — R42 Dizziness and giddiness: Secondary | ICD-10-CM | POA: Diagnosis not present

## 2016-05-27 DIAGNOSIS — R059 Cough, unspecified: Secondary | ICD-10-CM

## 2016-05-27 NOTE — Telephone Encounter (Signed)
Called and LVM for pt to call to verify she received message yesterday since I have not heard back. Gave [facility] phone number.

## 2016-05-31 DIAGNOSIS — R42 Dizziness and giddiness: Secondary | ICD-10-CM | POA: Diagnosis not present

## 2016-05-31 NOTE — Telephone Encounter (Signed)
Called and made f.u for 06/02/16 at 4pm, check in 345pm. Pt verbalized understanding. Ok per Dr Jaynee Eagles to use this slot.

## 2016-05-31 NOTE — Telephone Encounter (Signed)
Pt returned Emma's call. She said she saw PCP on 9/25 who advised her to call Dr A. PCP referred her to PT who advised her to call to get an appt with Dr A. Please call

## 2016-06-02 ENCOUNTER — Encounter: Payer: Self-pay | Admitting: Neurology

## 2016-06-02 ENCOUNTER — Other Ambulatory Visit: Payer: Medicare Other

## 2016-06-02 ENCOUNTER — Ambulatory Visit (INDEPENDENT_AMBULATORY_CARE_PROVIDER_SITE_OTHER): Payer: Medicare Other | Admitting: Neurology

## 2016-06-02 VITALS — BP 149/75 | HR 90 | Ht 60.5 in | Wt 106.0 lb

## 2016-06-02 DIAGNOSIS — I25118 Atherosclerotic heart disease of native coronary artery with other forms of angina pectoris: Secondary | ICD-10-CM

## 2016-06-02 DIAGNOSIS — R42 Dizziness and giddiness: Secondary | ICD-10-CM | POA: Diagnosis not present

## 2016-06-02 DIAGNOSIS — R51 Headache: Secondary | ICD-10-CM | POA: Diagnosis not present

## 2016-06-02 DIAGNOSIS — M316 Other giant cell arteritis: Secondary | ICD-10-CM

## 2016-06-02 DIAGNOSIS — H532 Diplopia: Secondary | ICD-10-CM | POA: Diagnosis not present

## 2016-06-02 DIAGNOSIS — R519 Headache, unspecified: Secondary | ICD-10-CM

## 2016-06-02 MED ORDER — DIAZEPAM 5 MG PO TABS: ORAL_TABLET | ORAL | 0 refills | Status: DC
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
## 2016-06-02 NOTE — Patient Instructions (Signed)
Remember to drink plenty of fluid, eat healthy meals and do not skip any meals. Try to eat protein with a every meal and eat a healthy snack such as fruit or nuts in between meals. Try to keep a regular sleep-wake schedule and try to exercise daily, particularly in the form of walking, 20-30 minutes a day, if you can.   As far as diagnostic testing: MRI brain w/wo contrast   Our phone number is [phone]. We also have an after hours call service for urgent matters and there is a physician on-call for urgent questions. For any emergencies you know to call 911 or go to the nearest emergency room

## 2016-06-03 ENCOUNTER — Telehealth: Payer: Self-pay | Admitting: *Deleted

## 2016-06-03 DIAGNOSIS — R42 Dizziness and giddiness: Secondary | ICD-10-CM | POA: Diagnosis not present

## 2016-06-03 LAB — BASIC METABOLIC PANEL
BUN/Creatinine Ratio: 13 (ref 12–28)
BUN: 13 mg/dL (ref 8–27)
CALCIUM: 9.7 mg/dL (ref 8.7–10.3)
CO2: 26 mmol/L (ref 18–29)
CREATININE: 1.04 mg/dL — AB (ref 0.57–1.00)
Chloride: 101 mmol/L (ref 96–106)
GFR, EST AFRICAN AMERICAN: 62 mL/min/{1.73_m2} (ref >59)
GFR, EST NON AFRICAN AMERICAN: 54 mL/min/{1.73_m2} — AB (ref >59)
Glucose: 199 mg/dL — ABNORMAL HIGH (ref 65–99)
POTASSIUM: 4 mmol/L (ref 3.5–5.2)
Sodium: 144 mmol/L (ref 134–144)

## 2016-06-03 LAB — SEDIMENTATION RATE: SED RATE: 6 mm/h (ref 0–40)

## 2016-06-03 LAB — C-REACTIVE PROTEIN: CRP: 0.6 mg/L (ref 0.0–4.9)

## 2016-06-03 NOTE — Telephone Encounter (Signed)
-----   Message from Melvenia Beam, MD sent at 06/03/2016 10:11 AM EDT ----- Labs unremarkable except for elevated gkucose of 199 (she may have eaten beforehand, this was not a fasting lab so this may not be abnormal) thanks

## 2016-06-03 NOTE — Telephone Encounter (Signed)
Called and spoke to patient about lab results per Dr Jaynee Eagles. Advised pt she should make sure PCP monitoring glucose. She verbalized understanding.

## 2016-06-04 DIAGNOSIS — E538 Deficiency of other specified B group vitamins: Secondary | ICD-10-CM | POA: Diagnosis not present

## 2016-06-07 DIAGNOSIS — R42 Dizziness and giddiness: Secondary | ICD-10-CM | POA: Diagnosis not present

## 2016-06-11 ENCOUNTER — Telehealth: Payer: Self-pay | Admitting: *Deleted

## 2016-06-11 DIAGNOSIS — R42 Dizziness and giddiness: Secondary | ICD-10-CM | POA: Diagnosis not present

## 2016-06-14 DIAGNOSIS — R42 Dizziness and giddiness: Secondary | ICD-10-CM | POA: Diagnosis not present

## 2016-06-14 NOTE — Telephone Encounter (Signed)
Patient sent to [facility], took call from son and gave him the number to [place].

## 2016-06-14 NOTE — Telephone Encounter (Signed)
Patient's MRI was ordered on 10/04. I called the patient to make sure she had been scheduled by [facility]. She stated that she had been.

## 2016-06-14 NOTE — Telephone Encounter (Signed)
Patient called, states she's been trying for 3 weeks to schedule MRI, states she spoke w/Danielle this morning, was given number to [facility]. Patient called [facility] and was told they can't schedule because they don't have paperwork. Skyped Danielle who took the call.

## 2016-06-14 NOTE — Telephone Encounter (Signed)
Pt's son called to speak with MRI scheduler. Andee Poles and Raquel Sarna were skyped and took call.

## 2016-06-18 DIAGNOSIS — D649 Anemia, unspecified: Secondary | ICD-10-CM | POA: Diagnosis not present

## 2016-06-18 DIAGNOSIS — I119 Hypertensive heart disease without heart failure: Secondary | ICD-10-CM | POA: Diagnosis not present

## 2016-06-18 DIAGNOSIS — E538 Deficiency of other specified B group vitamins: Secondary | ICD-10-CM | POA: Diagnosis not present

## 2016-06-21 ENCOUNTER — Telehealth: Payer: Self-pay | Admitting: Neurology

## 2016-06-21 DIAGNOSIS — R42 Dizziness and giddiness: Secondary | ICD-10-CM | POA: Diagnosis not present

## 2016-06-21 NOTE — Telephone Encounter (Signed)
Dr Jannifer Franklin- This is a Dr Jaynee Eagles patient. She is not here today. You are the [facility] this p.m. I clarified with phone staff and they stated patient was going to proceed to ED. I wanted to make sure you did not want to proceed differently or do anything else?

## 2016-06-21 NOTE — Telephone Encounter (Addendum)
Patient called, states she has been going to physical therapy for crystals in her ear, physical therapist advised, her symptoms are not for crystals in her ears, states she needs to contact her neurologist or go to ER, at today's visit said her left side was weak, is getting worse instead of better, "I think she thinks, I'm having mini strokes", pain left arm and hand is terrible, big toe on left foot is numb, "like she's had novocaine at the dentist". States "this has been going on for about 6 weeks now" patient adds that she will go to ER, asked patient if anyone was with her, patient states no, advised patient that she should call 911 if not safe to drive. Patient understands and agrees.

## 2016-06-21 NOTE — Telephone Encounter (Signed)
I called the patient. She has had worsening problems with a right sided headache and left sided weakness, no change in vision, but she is having problems with gait.   I would agree with the nursing assesment. The patient should go to the ER for a more urgent evaluation. I instructed her to go to [facility].

## 2016-06-22 NOTE — Telephone Encounter (Signed)
Called back no answer, left message for son

## 2016-06-22 NOTE — Telephone Encounter (Signed)
Call son

## 2016-06-22 NOTE — Telephone Encounter (Signed)
Dr Jaynee Eagles- FYI  Called patient. She is still in pain. She did not go to ER per dr Jannifer Franklin recommendation. She stated she did not have anyone to bring her, she is alone. Advised she can call 911. She understands. She states her son did not think she should go to ER. She declines going to ER at this time. She took tramadol and is going to lay down and see how she feels. She states her daughter is coming in a couple hours to check on her and she is an Therapist, sports. She lives with them. She wants to try and wait until Friday when her MRI is scheduled. I highly encouraged patient to proceed to ER as recommended and they can do MRI while she is there sooner than Friday. She denies an sx of stroke. She is going to wait and see how she feels later on today. She will call back and give me an update. She declined me scheduling a f/u with NP at this time.

## 2016-06-22 NOTE — Telephone Encounter (Signed)
Pt's son called in asking to speak with the physician. He wants to know what is going on with his mother. States she seems to go to the ER every third week or so. Does he really need to be concerned? Is this her?  Johns states that mother is always complaining of head pains ( headaches ) Is she seeking pain pills?  John - [phone]

## 2016-06-22 NOTE — Telephone Encounter (Signed)
Kimberly Downs, set her up with a follow up with carolyn please thanks

## 2016-06-22 NOTE — Telephone Encounter (Signed)
Dr Wendi Snipes son (he is listed on DPR) is requesting a call from you

## 2016-06-23 ENCOUNTER — Encounter: Payer: Self-pay | Admitting: Neurology

## 2016-06-23 ENCOUNTER — Ambulatory Visit: Payer: Medicare Other | Admitting: Neurology

## 2016-06-23 DIAGNOSIS — Z765 Malingerer [conscious simulation]: Secondary | ICD-10-CM | POA: Insufficient documentation

## 2016-06-23 NOTE — Telephone Encounter (Signed)
Spoke to son, he says his mother has a chronic history of pain and has always had pain medication. He asks if she is pain seeking. When father was in hospice, patient took his pain medication and went into respiratory distress. She gives vague and misleading information and he feels she is very smart and this is on purpose. He worries she is getting dilaudid and she is pain seeking. He says she only starts complaining of headaches when she runs out of pain medication and she is pain seeking.

## 2016-06-25 ENCOUNTER — Other Ambulatory Visit: Payer: Medicare Other

## 2016-06-25 ENCOUNTER — Ambulatory Visit
Admission: RE | Admit: 2016-06-25 | Discharge: 2016-06-25 | Disposition: A | Discharge status: Home / Self Care | Payer: Medicare Other | Source: Ambulatory Visit | Attending: Neurology | Admitting: Neurology

## 2016-06-25 DIAGNOSIS — R51 Headache: Principal | ICD-10-CM

## 2016-06-25 DIAGNOSIS — H532 Diplopia: Secondary | ICD-10-CM

## 2016-06-25 DIAGNOSIS — R519 Headache, unspecified: Secondary | ICD-10-CM

## 2016-06-25 DIAGNOSIS — R42 Dizziness and giddiness: Secondary | ICD-10-CM | POA: Diagnosis not present

## 2016-06-25 DIAGNOSIS — M316 Other giant cell arteritis: Secondary | ICD-10-CM

## 2016-06-25 MED ORDER — GADOBENATE DIMEGLUMINE 529 MG/ML IV SOLN
9.0000 mL | Freq: Once | INTRAVENOUS | Status: AC | PRN
  Administered 2016-06-25: 9 mL via INTRAVENOUS

## 2016-06-28 ENCOUNTER — Telehealth: Payer: Self-pay | Admitting: Neurology

## 2016-06-28 DIAGNOSIS — R42 Dizziness and giddiness: Secondary | ICD-10-CM | POA: Diagnosis not present

## 2016-06-28 NOTE — Telephone Encounter (Signed)
Patient is calling to get MRI results and also needs to discuss pain on the right side of  her head which makes her nauseated. Pain medication does not help.Please call and discuss.

## 2016-06-28 NOTE — Telephone Encounter (Signed)
Left a message for patient. I advised I do not see any reason for her headaches in the mri of her brain. She is apparently having another headache. Son called and I had a long discussion with him, patient apparently has a long history of drug-seeking behavior. In the past she has taken other people's pain medications including her husband's, she frequents EDs for pain medications. If she has a new headache she needs to be seen by one of our NPs but our office has a strict no chronic pain medication policy. She can come in and be evaluated for migraine/headache preventative medications but not pain medications. She is to call us back if she would like a follow up appointment for headache but we cannot prescribe meds over the phone and again no pain medication.  Would you try calling her back and relaying this again? thanks

## 2016-06-28 NOTE — Telephone Encounter (Signed)
Dr Ahern- please advise 

## 2016-06-29 NOTE — Telephone Encounter (Signed)
Kimberly Downs, patient is reportedly drug seeking. I think we may consider cognitive behavioral therapy for her headaches as opposed to medication. She has multiple changing symptoms. MRI negative for any causes. I had a discussion with son. lets discuss

## 2016-06-29 NOTE — Telephone Encounter (Signed)
Called and spoke to Kimberly Downs. Made f/u tomorrow at 1pm, check in 1245 w/ MM, NP. Advised again that we donnot prescribe chronic pain medication. She can be evaluated in office and discuss possible preventative medications, not pain medications. She verbalized understanding.  Spoke to Kimberly Downs about MRI results per Dr Jaynee Eagles phone note She verbalized understanding.

## 2016-06-30 ENCOUNTER — Ambulatory Visit: Payer: Self-pay | Admitting: Adult Health

## 2016-07-02 DIAGNOSIS — R42 Dizziness and giddiness: Secondary | ICD-10-CM | POA: Diagnosis not present

## 2016-07-05 DIAGNOSIS — R42 Dizziness and giddiness: Secondary | ICD-10-CM | POA: Diagnosis not present

## 2016-07-19 DIAGNOSIS — D649 Anemia, unspecified: Secondary | ICD-10-CM | POA: Diagnosis not present

## 2016-07-19 DIAGNOSIS — E1140 Type 2 diabetes mellitus with diabetic neuropathy, unspecified: Secondary | ICD-10-CM | POA: Diagnosis not present

## 2016-07-19 DIAGNOSIS — F419 Anxiety disorder, unspecified: Secondary | ICD-10-CM | POA: Diagnosis not present

## 2016-07-19 DIAGNOSIS — M353 Polymyalgia rheumatica: Secondary | ICD-10-CM | POA: Diagnosis not present

## 2016-07-19 DIAGNOSIS — E538 Deficiency of other specified B group vitamins: Secondary | ICD-10-CM | POA: Diagnosis not present

## 2016-07-19 DIAGNOSIS — R51 Headache: Secondary | ICD-10-CM | POA: Diagnosis not present

## 2016-07-26 ENCOUNTER — Encounter: Payer: Self-pay | Admitting: *Deleted

## 2016-07-28 ENCOUNTER — Encounter: Payer: Self-pay | Admitting: Cardiovascular Disease

## 2016-07-28 ENCOUNTER — Ambulatory Visit (INDEPENDENT_AMBULATORY_CARE_PROVIDER_SITE_OTHER): Payer: Medicare Other | Admitting: Cardiovascular Disease

## 2016-07-28 DIAGNOSIS — I2510 Atherosclerotic heart disease of native coronary artery without angina pectoris: Secondary | ICD-10-CM | POA: Diagnosis not present

## 2016-07-28 DIAGNOSIS — I10 Essential (primary) hypertension: Secondary | ICD-10-CM

## 2016-07-28 DIAGNOSIS — E7800 Pure hypercholesterolemia, unspecified: Secondary | ICD-10-CM | POA: Diagnosis not present

## 2016-07-28 DIAGNOSIS — J410 Simple chronic bronchitis: Secondary | ICD-10-CM

## 2016-07-28 NOTE — Patient Instructions (Signed)

## 2016-07-28 NOTE — Assessment & Plan Note (Signed)
History of CAD status post coronary stenting in [place] approximately 6 years ago. She did have a chest CT that showed coronary calcification in all 3 coronary arteries. During her recent hospitalization in August she had a Myoview stress test that was low risk and nonischemic. She denies chest pain or shortness of breath.

## 2016-07-28 NOTE — Assessment & Plan Note (Signed)
History of hypertension blood pressure measured today at 160/80. She is on Avapro and verapamil. Continue current meds at current dosing

## 2016-07-28 NOTE — Progress Notes (Signed)
07/28/2016 Kimberly Downs   Jul 03, 1946  [id number]  Primary Physician Kimberly Shelter, PA-C Primary Cardiologist: Kimberly Harp MD Kimberly Downs  HPI:  Kimberly Downs is a 70 year old mildly overweight weight recently widowed Caucasian female mother of one son who is here in [place] who I last saw during a recent hospitalization October 2016 to perform abdominal aortography and rule out mesenteric ischemia. She has a history of coronary stenting in [place] approximately 7 years ago apparently did not have a myocardial infarction. Her cardiac risk factors include continued tobacco abuse. Strong family history, diabetes, hypertension and hyperlipidemia. She gets occasional chest pain. She also complains of claudication. She says several months of postprandial abdominal pain with food avoidance and 30 pound weight loss. She's had multiple colonoscopies was told she's had C. Difficile on several occasions. Recent colonoscopy performed by Dr. Benson Downs did not show any mucosal changes of ischemic colitis. A abdominal CT however did show high-grade disease in the celiac axis, superior and inferior mesenteric artery suggesting an ischemic etiology. I performed abdominal aortography on 06/02/15 showing an most mild to moderate superior mesenteric artery stenosis and celiac stenosis. She did have a Myoview stress test performed at the time of that hospitalization which was nonischemic. She denies chest pain or shortness of breath. She does continue to smoke a half a pack per day and has a 50-75-pack-year history of tobacco abuse.   Current Outpatient Prescriptions  Medication Sig Dispense Refill  . ALPRAZolam (XANAX) 0.5 MG tablet Take 0.5 mg by mouth 2 (two) times daily.     [name] aspirin EC 81 MG tablet Take 81 mg by mouth daily.     [name] atorvastatin (LIPITOR) 10 MG tablet Take 10 mg by mouth at bedtime.     . clopidogrel (PLAVIX) 75 MG tablet Take 75 mg by mouth daily.    . cyanocobalamin ([employer]  VITAMIN B12) 2000 MCG tablet Take 1 tablet (2,000 mcg total) by mouth daily. 120 tablet 0  . ipratropium-albuterol (DUONEB) 0.5-2.5 (3) MG/3ML SOLN Take 3 mLs by nebulization 3 (three) times daily. 360 mL 11  . irbesartan (AVAPRO) 150 MG tablet Take 150 mg by mouth daily.    . isosorbide mononitrate (IMDUR) 30 MG 24 hr tablet Take 1 tablet (30 mg total) by mouth daily. 30 tablet 6  . pentoxifylline (TRENTAL) 400 MG CR tablet Take 400 mg by mouth daily.    . potassium chloride (K-DUR) 10 MEQ tablet Take 10 mEq by mouth 2 (two) times daily.   0  . predniSONE (DELTASONE) 10 MG tablet Take 10 mg by mouth daily with breakfast.    . RABEprazole (ACIPHEX) 20 MG tablet Take 20 mg by mouth daily.    [name] rOPINIRole (REQUIP) 2 MG tablet Take 2 mg by mouth at bedtime.    . sitaGLIPtin (JANUVIA) 100 MG tablet Take 100 mg by mouth daily.    . traMADol (ULTRAM) 50 MG tablet Take 50-100 mg by mouth every 6 (six) hours as needed (for headaches).    . verapamil (VERELAN PM) 180 MG 24 hr capsule Take 180 mg by mouth at bedtime.     No current facility-administered medications for this visit.     Allergies  Allergen Reactions  . Dicyclomine Other (See Comments)    Reaction:  Agitation   . Morphine And Related Nausea And Vomiting  . Nsaids Nausea And Vomiting  . Toradol [Ketorolac Tromethamine] Nausea And Vomiting  . Levaquin [Levofloxacin] Nausea And Vomiting and  Rash  . Metronidazole Nausea And Vomiting and Rash    Social History   Social History  . Marital status: Widowed    Spouse name: N/A  . Number of children: 1  . Years of education: 44   Occupational History  . Retired - [employer] employee    Social History Main Topics  . Smoking status: Current Every Day Smoker    Packs/day: 1.00    Years: 53.00  . Smokeless tobacco: Never Used  . Alcohol use Yes     Comment: rare use  . Drug use: No  . Sexual activity: No   Other Topics Concern  . Not on file   Social History Narrative   Lives with  son, Kimberly Downs   Caffeine use: 2 cups coffee/day      Review of Systems: General: negative for chills, fever, night sweats or weight changes.  Cardiovascular: negative for chest pain, dyspnea on exertion, edema, orthopnea, palpitations, paroxysmal nocturnal dyspnea or shortness of breath Dermatological: negative for rash Respiratory: negative for cough or wheezing Urologic: negative for hematuria Abdominal: negative for nausea, vomiting, diarrhea, bright red blood per rectum, melena, or hematemesis Neurologic: negative for visual changes, syncope, or dizziness All other systems reviewed and are otherwise negative except as noted above.    Blood pressure (!) 160/80, pulse 78, height 5' (1.524 m), weight 111 lb (50.3 kg).  General appearance: alert and no distress Neck: no adenopathy, no carotid bruit, no JVD, supple, symmetrical, trachea midline and thyroid not enlarged, symmetric, no tenderness/mass/nodules Lungs: clear to auscultation bilaterally Heart: regular rate and rhythm, S1, S2 normal, no murmur, click, rub or gallop Extremities: extremities normal, atraumatic, no cyanosis or edema  EKG not performed today  ASSESSMENT AND PLAN:   COPD (chronic obstructive pulmonary disease) (HCC) History of COPD with 50-75 pack years of tobacco abuse continuing to smoke one half pack per day recalcitrant risk factor modification  Essential hypertension History of hypertension blood pressure measured today at 160/80. She is on Avapro and verapamil. Continue current meds at current dosing  CAD (coronary artery disease) History of CAD status post coronary stenting in [place] approximately 6 years ago. She did have a chest CT that showed coronary calcification in all 3 coronary arteries. During her recent hospitalization in August she had a Myoview stress test that was low risk and nonischemic. She denies chest pain or shortness of breath.  Hyperlipidemia History of hyperlipidemia on  statin therapy with recent lipid profile performed 04/04/16 her ventral cholesterol 31, LDL 66 and HDL of [place] MD [facility] 07/28/2016 3:06 PM

## 2016-07-28 NOTE — Assessment & Plan Note (Signed)
History of hyperlipidemia on statin therapy with recent lipid profile performed 04/04/16 her ventral cholesterol 31, LDL 66 and HDL of 57

## 2016-07-28 NOTE — Assessment & Plan Note (Signed)
History of COPD with 50-75 pack years of tobacco abuse continuing to smoke one half pack per day recalcitrant risk factor modification

## 2016-08-16 DIAGNOSIS — E538 Deficiency of other specified B group vitamins: Secondary | ICD-10-CM | POA: Diagnosis not present

## 2016-08-29 DIAGNOSIS — R079 Chest pain, unspecified: Secondary | ICD-10-CM | POA: Diagnosis not present

## 2016-08-29 DIAGNOSIS — I10 Essential (primary) hypertension: Secondary | ICD-10-CM | POA: Diagnosis not present

## 2016-08-29 DIAGNOSIS — R05 Cough: Secondary | ICD-10-CM | POA: Diagnosis not present

## 2016-09-02 ENCOUNTER — Ambulatory Visit: Payer: Medicare Other | Admitting: Adult Health

## 2016-09-13 DIAGNOSIS — Z8619 Personal history of other infectious and parasitic diseases: Secondary | ICD-10-CM | POA: Diagnosis not present

## 2016-09-13 DIAGNOSIS — Z7984 Long term (current) use of oral hypoglycemic drugs: Secondary | ICD-10-CM | POA: Diagnosis not present

## 2016-09-13 DIAGNOSIS — R6883 Chills (without fever): Secondary | ICD-10-CM | POA: Diagnosis not present

## 2016-09-13 DIAGNOSIS — E1140 Type 2 diabetes mellitus with diabetic neuropathy, unspecified: Secondary | ICD-10-CM | POA: Diagnosis not present

## 2016-09-13 DIAGNOSIS — I2510 Atherosclerotic heart disease of native coronary artery without angina pectoris: Secondary | ICD-10-CM | POA: Diagnosis not present

## 2016-09-13 DIAGNOSIS — R079 Chest pain, unspecified: Secondary | ICD-10-CM | POA: Diagnosis not present

## 2016-09-13 DIAGNOSIS — I6200 Nontraumatic subdural hemorrhage, unspecified: Secondary | ICD-10-CM | POA: Diagnosis not present

## 2016-09-13 DIAGNOSIS — R05 Cough: Secondary | ICD-10-CM | POA: Diagnosis not present

## 2016-09-13 DIAGNOSIS — E538 Deficiency of other specified B group vitamins: Secondary | ICD-10-CM | POA: Diagnosis not present

## 2016-09-13 DIAGNOSIS — I119 Hypertensive heart disease without heart failure: Secondary | ICD-10-CM | POA: Diagnosis not present

## 2016-09-22 DIAGNOSIS — I119 Hypertensive heart disease without heart failure: Secondary | ICD-10-CM | POA: Diagnosis not present

## 2016-09-22 DIAGNOSIS — R05 Cough: Secondary | ICD-10-CM | POA: Diagnosis not present

## 2016-09-22 DIAGNOSIS — M353 Polymyalgia rheumatica: Secondary | ICD-10-CM | POA: Diagnosis not present

## 2016-09-22 DIAGNOSIS — J449 Chronic obstructive pulmonary disease, unspecified: Secondary | ICD-10-CM | POA: Diagnosis not present

## 2016-09-22 DIAGNOSIS — I739 Peripheral vascular disease, unspecified: Secondary | ICD-10-CM | POA: Diagnosis not present

## 2016-10-04 DIAGNOSIS — I700 Atherosclerosis of aorta: Secondary | ICD-10-CM | POA: Diagnosis not present

## 2016-10-04 DIAGNOSIS — R0789 Other chest pain: Secondary | ICD-10-CM | POA: Diagnosis not present

## 2016-10-25 ENCOUNTER — Ambulatory Visit (INDEPENDENT_AMBULATORY_CARE_PROVIDER_SITE_OTHER): Payer: Medicare Other | Admitting: Ophthalmology

## 2016-11-10 DIAGNOSIS — R079 Chest pain, unspecified: Secondary | ICD-10-CM | POA: Diagnosis not present

## 2016-11-11 ENCOUNTER — Ambulatory Visit (HOSPITAL_BASED_OUTPATIENT_CLINIC_OR_DEPARTMENT_OTHER)
Admission: RE | Admit: 2016-11-11 | Discharge: 2016-11-11 | Disposition: A | Discharge status: Home / Self Care | Payer: Medicare Other | Source: Ambulatory Visit | Attending: Family Medicine | Admitting: Family Medicine

## 2016-11-11 ENCOUNTER — Other Ambulatory Visit (HOSPITAL_BASED_OUTPATIENT_CLINIC_OR_DEPARTMENT_OTHER): Payer: Self-pay | Admitting: Family Medicine

## 2016-11-11 ENCOUNTER — Encounter (HOSPITAL_BASED_OUTPATIENT_CLINIC_OR_DEPARTMENT_OTHER): Payer: Self-pay

## 2016-11-11 DIAGNOSIS — D1809 Hemangioma of other sites: Secondary | ICD-10-CM | POA: Diagnosis not present

## 2016-11-11 DIAGNOSIS — R918 Other nonspecific abnormal finding of lung field: Secondary | ICD-10-CM | POA: Diagnosis not present

## 2016-11-11 DIAGNOSIS — R079 Chest pain, unspecified: Secondary | ICD-10-CM | POA: Diagnosis not present

## 2016-11-11 DIAGNOSIS — S2231XA Fracture of one rib, right side, initial encounter for closed fracture: Secondary | ICD-10-CM | POA: Diagnosis not present

## 2016-11-11 DIAGNOSIS — I7090 Unspecified atherosclerosis: Secondary | ICD-10-CM | POA: Diagnosis not present

## 2016-11-11 MED ORDER — IOPAMIDOL (ISOVUE-300) INJECTION 61%
100.0000 mL | Freq: Once | INTRAVENOUS | Status: AC | PRN
  Administered 2016-11-11: 80 mL via INTRAVENOUS

## 2017-01-10 DIAGNOSIS — R319 Hematuria, unspecified: Secondary | ICD-10-CM | POA: Diagnosis not present

## 2017-01-10 DIAGNOSIS — K5909 Other constipation: Secondary | ICD-10-CM | POA: Diagnosis not present

## 2017-01-10 DIAGNOSIS — N390 Urinary tract infection, site not specified: Secondary | ICD-10-CM | POA: Diagnosis not present

## 2017-01-10 DIAGNOSIS — R300 Dysuria: Secondary | ICD-10-CM | POA: Diagnosis not present

## 2017-01-10 DIAGNOSIS — Z8719 Personal history of other diseases of the digestive system: Secondary | ICD-10-CM | POA: Diagnosis not present

## 2017-01-10 DIAGNOSIS — K5790 Diverticulosis of intestine, part unspecified, without perforation or abscess without bleeding: Secondary | ICD-10-CM | POA: Diagnosis not present

## 2017-01-10 DIAGNOSIS — R05 Cough: Secondary | ICD-10-CM | POA: Diagnosis not present

## 2017-01-10 DIAGNOSIS — R1084 Generalized abdominal pain: Secondary | ICD-10-CM | POA: Diagnosis not present

## 2017-01-10 DIAGNOSIS — F419 Anxiety disorder, unspecified: Secondary | ICD-10-CM | POA: Diagnosis not present

## 2017-01-10 DIAGNOSIS — K660 Peritoneal adhesions (postprocedural) (postinfection): Secondary | ICD-10-CM | POA: Diagnosis not present

## 2017-01-12 ENCOUNTER — Other Ambulatory Visit: Payer: Self-pay | Admitting: Family Medicine

## 2017-01-12 ENCOUNTER — Ambulatory Visit (HOSPITAL_BASED_OUTPATIENT_CLINIC_OR_DEPARTMENT_OTHER)
Admission: RE | Admit: 2017-01-12 | Discharge: 2017-01-12 | Disposition: A | Discharge status: Home / Self Care | Payer: Medicare Other | Source: Ambulatory Visit | Attending: Family Medicine | Admitting: Family Medicine

## 2017-01-12 ENCOUNTER — Encounter (HOSPITAL_BASED_OUTPATIENT_CLINIC_OR_DEPARTMENT_OTHER): Payer: Self-pay

## 2017-01-12 DIAGNOSIS — R1031 Right lower quadrant pain: Secondary | ICD-10-CM | POA: Insufficient documentation

## 2017-01-12 DIAGNOSIS — Z8619 Personal history of other infectious and parasitic diseases: Secondary | ICD-10-CM | POA: Diagnosis not present

## 2017-01-12 DIAGNOSIS — M79604 Pain in right leg: Secondary | ICD-10-CM | POA: Diagnosis not present

## 2017-01-12 DIAGNOSIS — D649 Anemia, unspecified: Secondary | ICD-10-CM | POA: Diagnosis not present

## 2017-01-12 DIAGNOSIS — R1032 Left lower quadrant pain: Secondary | ICD-10-CM | POA: Diagnosis not present

## 2017-01-12 DIAGNOSIS — I700 Atherosclerosis of aorta: Secondary | ICD-10-CM | POA: Insufficient documentation

## 2017-01-12 DIAGNOSIS — K7689 Other specified diseases of liver: Secondary | ICD-10-CM | POA: Diagnosis not present

## 2017-01-12 DIAGNOSIS — K5790 Diverticulosis of intestine, part unspecified, without perforation or abscess without bleeding: Secondary | ICD-10-CM | POA: Diagnosis not present

## 2017-01-12 DIAGNOSIS — Z8719 Personal history of other diseases of the digestive system: Secondary | ICD-10-CM | POA: Diagnosis not present

## 2017-01-12 DIAGNOSIS — R195 Other fecal abnormalities: Secondary | ICD-10-CM | POA: Diagnosis not present

## 2017-01-12 DIAGNOSIS — K5909 Other constipation: Secondary | ICD-10-CM | POA: Diagnosis not present

## 2017-01-12 DIAGNOSIS — Z9049 Acquired absence of other specified parts of digestive tract: Secondary | ICD-10-CM | POA: Insufficient documentation

## 2017-01-12 DIAGNOSIS — K769 Liver disease, unspecified: Secondary | ICD-10-CM | POA: Insufficient documentation

## 2017-01-12 DIAGNOSIS — M353 Polymyalgia rheumatica: Secondary | ICD-10-CM | POA: Diagnosis not present

## 2017-01-12 DIAGNOSIS — R1030 Lower abdominal pain, unspecified: Secondary | ICD-10-CM | POA: Diagnosis not present

## 2017-01-12 DIAGNOSIS — M79605 Pain in left leg: Secondary | ICD-10-CM | POA: Diagnosis not present

## 2017-01-12 DIAGNOSIS — K660 Peritoneal adhesions (postprocedural) (postinfection): Secondary | ICD-10-CM | POA: Diagnosis not present

## 2017-01-12 MED ORDER — IOPAMIDOL (ISOVUE-300) INJECTION 61%
100.0000 mL | Freq: Once | INTRAVENOUS | Status: AC | PRN
  Administered 2017-01-12: 100 mL via INTRAVENOUS

## 2017-01-26 ENCOUNTER — Emergency Department (HOSPITAL_COMMUNITY): Payer: Medicare Other

## 2017-01-26 ENCOUNTER — Inpatient Hospital Stay (HOSPITAL_COMMUNITY)
Admission: EM | Admit: 2017-01-26 | Discharge: 2017-02-02 | DRG: 208 | Disposition: A | Discharge status: Home Health | Payer: Medicare Other | Attending: Family Medicine | Admitting: Family Medicine

## 2017-01-26 ENCOUNTER — Encounter (INDEPENDENT_AMBULATORY_CARE_PROVIDER_SITE_OTHER): Payer: Medicare Other | Admitting: Ophthalmology

## 2017-01-26 ENCOUNTER — Other Ambulatory Visit: Payer: Self-pay

## 2017-01-26 ENCOUNTER — Encounter (HOSPITAL_COMMUNITY): Payer: Self-pay | Admitting: Emergency Medicine

## 2017-01-26 DIAGNOSIS — J189 Pneumonia, unspecified organism: Secondary | ICD-10-CM | POA: Diagnosis not present

## 2017-01-26 DIAGNOSIS — J9601 Acute respiratory failure with hypoxia: Secondary | ICD-10-CM | POA: Diagnosis present

## 2017-01-26 DIAGNOSIS — Z9911 Dependence on respirator [ventilator] status: Secondary | ICD-10-CM

## 2017-01-26 DIAGNOSIS — R197 Diarrhea, unspecified: Secondary | ICD-10-CM | POA: Diagnosis present

## 2017-01-26 DIAGNOSIS — K922 Gastrointestinal hemorrhage, unspecified: Secondary | ICD-10-CM

## 2017-01-26 DIAGNOSIS — E46 Unspecified protein-calorie malnutrition: Secondary | ICD-10-CM | POA: Diagnosis not present

## 2017-01-26 DIAGNOSIS — J9621 Acute and chronic respiratory failure with hypoxia: Secondary | ICD-10-CM | POA: Diagnosis present

## 2017-01-26 DIAGNOSIS — D500 Iron deficiency anemia secondary to blood loss (chronic): Secondary | ICD-10-CM | POA: Diagnosis present

## 2017-01-26 DIAGNOSIS — J9690 Respiratory failure, unspecified, unspecified whether with hypoxia or hypercapnia: Secondary | ICD-10-CM

## 2017-01-26 DIAGNOSIS — R0902 Hypoxemia: Secondary | ICD-10-CM

## 2017-01-26 DIAGNOSIS — I5042 Chronic combined systolic (congestive) and diastolic (congestive) heart failure: Secondary | ICD-10-CM | POA: Diagnosis present

## 2017-01-26 DIAGNOSIS — K254 Chronic or unspecified gastric ulcer with hemorrhage: Secondary | ICD-10-CM | POA: Diagnosis present

## 2017-01-26 DIAGNOSIS — J440 Chronic obstructive pulmonary disease with acute lower respiratory infection: Principal | ICD-10-CM | POA: Diagnosis present

## 2017-01-26 DIAGNOSIS — Z01818 Encounter for other preprocedural examination: Secondary | ICD-10-CM

## 2017-01-26 DIAGNOSIS — Z7951 Long term (current) use of inhaled steroids: Secondary | ICD-10-CM

## 2017-01-26 DIAGNOSIS — I4891 Unspecified atrial fibrillation: Secondary | ICD-10-CM

## 2017-01-26 DIAGNOSIS — Z881 Allergy status to other antibiotic agents status: Secondary | ICD-10-CM

## 2017-01-26 DIAGNOSIS — I959 Hypotension, unspecified: Secondary | ICD-10-CM | POA: Diagnosis present

## 2017-01-26 DIAGNOSIS — F419 Anxiety disorder, unspecified: Secondary | ICD-10-CM | POA: Diagnosis present

## 2017-01-26 DIAGNOSIS — Z87442 Personal history of urinary calculi: Secondary | ICD-10-CM

## 2017-01-26 DIAGNOSIS — Z7984 Long term (current) use of oral hypoglycemic drugs: Secondary | ICD-10-CM

## 2017-01-26 DIAGNOSIS — R0602 Shortness of breath: Secondary | ICD-10-CM | POA: Diagnosis not present

## 2017-01-26 DIAGNOSIS — D631 Anemia in chronic kidney disease: Secondary | ICD-10-CM | POA: Diagnosis present

## 2017-01-26 DIAGNOSIS — Z7982 Long term (current) use of aspirin: Secondary | ICD-10-CM

## 2017-01-26 DIAGNOSIS — G9340 Encephalopathy, unspecified: Secondary | ICD-10-CM | POA: Diagnosis present

## 2017-01-26 DIAGNOSIS — I10 Essential (primary) hypertension: Secondary | ICD-10-CM | POA: Diagnosis present

## 2017-01-26 DIAGNOSIS — E1122 Type 2 diabetes mellitus with diabetic chronic kidney disease: Secondary | ICD-10-CM | POA: Diagnosis present

## 2017-01-26 DIAGNOSIS — Z8249 Family history of ischemic heart disease and other diseases of the circulatory system: Secondary | ICD-10-CM

## 2017-01-26 DIAGNOSIS — E1129 Type 2 diabetes mellitus with other diabetic kidney complication: Secondary | ICD-10-CM | POA: Diagnosis present

## 2017-01-26 DIAGNOSIS — F17210 Nicotine dependence, cigarettes, uncomplicated: Secondary | ICD-10-CM | POA: Diagnosis present

## 2017-01-26 DIAGNOSIS — B3781 Candidal esophagitis: Secondary | ICD-10-CM | POA: Diagnosis present

## 2017-01-26 DIAGNOSIS — Z79899 Other long term (current) drug therapy: Secondary | ICD-10-CM

## 2017-01-26 DIAGNOSIS — Z781 Physical restraint status: Secondary | ICD-10-CM

## 2017-01-26 DIAGNOSIS — I2510 Atherosclerotic heart disease of native coronary artery without angina pectoris: Secondary | ICD-10-CM | POA: Diagnosis present

## 2017-01-26 DIAGNOSIS — J441 Chronic obstructive pulmonary disease with (acute) exacerbation: Secondary | ICD-10-CM | POA: Diagnosis not present

## 2017-01-26 DIAGNOSIS — J180 Bronchopneumonia, unspecified organism: Secondary | ICD-10-CM | POA: Diagnosis not present

## 2017-01-26 DIAGNOSIS — R6881 Early satiety: Secondary | ICD-10-CM | POA: Diagnosis present

## 2017-01-26 DIAGNOSIS — G2581 Restless legs syndrome: Secondary | ICD-10-CM | POA: Diagnosis present

## 2017-01-26 DIAGNOSIS — Z681 Body mass index (BMI) 19 or less, adult: Secondary | ICD-10-CM

## 2017-01-26 DIAGNOSIS — Z7902 Long term (current) use of antithrombotics/antiplatelets: Secondary | ICD-10-CM

## 2017-01-26 DIAGNOSIS — R778 Other specified abnormalities of plasma proteins: Secondary | ICD-10-CM | POA: Diagnosis not present

## 2017-01-26 DIAGNOSIS — E785 Hyperlipidemia, unspecified: Secondary | ICD-10-CM | POA: Diagnosis present

## 2017-01-26 DIAGNOSIS — A419 Sepsis, unspecified organism: Secondary | ICD-10-CM | POA: Diagnosis present

## 2017-01-26 DIAGNOSIS — I130 Hypertensive heart and chronic kidney disease with heart failure and stage 1 through stage 4 chronic kidney disease, or unspecified chronic kidney disease: Secondary | ICD-10-CM | POA: Diagnosis present

## 2017-01-26 DIAGNOSIS — K319 Disease of stomach and duodenum, unspecified: Secondary | ICD-10-CM | POA: Diagnosis present

## 2017-01-26 DIAGNOSIS — Z955 Presence of coronary angioplasty implant and graft: Secondary | ICD-10-CM

## 2017-01-26 DIAGNOSIS — I480 Paroxysmal atrial fibrillation: Secondary | ICD-10-CM | POA: Diagnosis present

## 2017-01-26 DIAGNOSIS — N183 Chronic kidney disease, stage 3 (moderate): Secondary | ICD-10-CM | POA: Diagnosis present

## 2017-01-26 LAB — BASIC METABOLIC PANEL
ANION GAP: 13 (ref 5–15)
BUN: 13 mg/dL (ref 6–20)
CHLORIDE: 95 mmol/L — AB (ref 101–111)
CO2: 24 mmol/L (ref 22–32)
Calcium: 9 mg/dL (ref 8.9–10.3)
Creatinine, Ser: 0.94 mg/dL (ref 0.44–1.00)
GFR calc Af Amer: >60 mL/min (ref >60)
GFR, EST NON AFRICAN AMERICAN: 59 mL/min — AB (ref >60)
GLUCOSE: 143 mg/dL — AB (ref 65–99)
POTASSIUM: 3.4 mmol/L — AB (ref 3.5–5.1)
Sodium: 132 mmol/L — ABNORMAL LOW (ref 135–145)

## 2017-01-26 LAB — I-STAT ARTERIAL BLOOD GAS, ED
Acid-Base Excess: 1 mmol/L (ref 0.0–2.0)
BICARBONATE: 25.5 mmol/L (ref 20.0–28.0)
O2 Saturation: 99 %
PCO2 ART: 39.2 mmHg (ref 32.0–48.0)
TCO2: 27 mmol/L (ref 0–100)
pH, Arterial: 7.422 (ref 7.350–7.450)
pO2, Arterial: 119 mmHg — ABNORMAL HIGH (ref 83.0–108.0)

## 2017-01-26 LAB — I-STAT TROPONIN, ED: Troponin i, poc: 0 ng/mL (ref 0.00–0.08)

## 2017-01-26 LAB — CBC
HEMATOCRIT: 30.4 % — AB (ref 36.0–46.0)
HEMOGLOBIN: 9.5 g/dL — AB (ref 12.0–15.0)
MCH: 25 pg — ABNORMAL LOW (ref 26.0–34.0)
MCHC: 31.3 g/dL (ref 30.0–36.0)
MCV: 80 fL (ref 78.0–100.0)
Platelets: 475 10*3/uL — ABNORMAL HIGH (ref 150–400)
RBC: 3.8 MIL/uL — ABNORMAL LOW (ref 3.87–5.11)
RDW: 13.9 % (ref 11.5–15.5)
WBC: 14 10*3/uL — ABNORMAL HIGH (ref 4.0–10.5)

## 2017-01-26 LAB — I-STAT CG4 LACTIC ACID, ED: LACTIC ACID, VENOUS: 1.73 mmol/L (ref 0.5–1.9)

## 2017-01-26 MED ORDER — SODIUM CHLORIDE 0.9 % IV BOLUS (SEPSIS)
500.0000 mL | Freq: Once | INTRAVENOUS | Status: AC
  Administered 2017-01-27: 500 mL via INTRAVENOUS

## 2017-01-26 MED ORDER — METHYLPREDNISOLONE SODIUM SUCC 125 MG IJ SOLR
60.0000 mg | Freq: Two times a day (BID) | INTRAMUSCULAR | Status: DC
  Administered 2017-01-27 – 2017-01-28 (×4): 60 mg via INTRAVENOUS
  Filled 2017-01-26 (×4): qty 2

## 2017-01-26 MED ORDER — ALBUTEROL SULFATE (2.5 MG/3ML) 0.083% IN NEBU
5.0000 mg | INHALATION_SOLUTION | Freq: Once | RESPIRATORY_TRACT | Status: AC
  Administered 2017-01-26: 5 mg via RESPIRATORY_TRACT

## 2017-01-26 MED ORDER — MAGNESIUM SULFATE 2 GM/50ML IV SOLN
2.0000 g | Freq: Once | INTRAVENOUS | Status: AC
  Administered 2017-01-26: 2 g via INTRAVENOUS
  Filled 2017-01-26: qty 50

## 2017-01-26 MED ORDER — IPRATROPIUM BROMIDE 0.02 % IN SOLN: 0.5000 mg | Freq: Four times a day (QID) | RESPIRATORY_TRACT | Status: DC

## 2017-01-26 MED ORDER — PREDNISONE 20 MG PO TABS
60.0000 mg | ORAL_TABLET | Freq: Every day | ORAL | Status: DC
  Administered 2017-01-26: 60 mg via ORAL
  Filled 2017-01-26: qty 3

## 2017-01-26 MED ORDER — ALBUTEROL SULFATE (2.5 MG/3ML) 0.083% IN NEBU
INHALATION_SOLUTION | RESPIRATORY_TRACT | Status: AC
  Filled 2017-01-26: qty 6

## 2017-01-26 MED ORDER — SODIUM CHLORIDE 0.9 % IV BOLUS (SEPSIS)
1000.0000 mL | Freq: Once | INTRAVENOUS | Status: AC
  Administered 2017-01-26: 1000 mL via INTRAVENOUS

## 2017-01-26 MED ORDER — DEXTROSE 5 % IV SOLN
500.0000 mg | Freq: Once | INTRAVENOUS | Status: AC
  Administered 2017-01-26: 500 mg via INTRAVENOUS
  Filled 2017-01-26: qty 500

## 2017-01-26 MED ORDER — IPRATROPIUM-ALBUTEROL 0.5-2.5 (3) MG/3ML IN SOLN: 3.0000 mL | Freq: Four times a day (QID) | RESPIRATORY_TRACT | Status: DC

## 2017-01-26 MED ORDER — LORAZEPAM 2 MG/ML IJ SOLN
0.5000 mg | Freq: Once | INTRAMUSCULAR | Status: AC
  Administered 2017-01-26: 0.5 mg via INTRAVENOUS
  Filled 2017-01-26: qty 1

## 2017-01-26 MED ORDER — DILTIAZEM LOAD VIA INFUSION
20.0000 mg | Freq: Once | INTRAVENOUS | Status: AC
  Administered 2017-01-26: 20 mg via INTRAVENOUS
  Filled 2017-01-26: qty 20

## 2017-01-26 MED ORDER — LEVALBUTEROL HCL 0.63 MG/3ML IN NEBU: 0.6300 mg | INHALATION_SOLUTION | RESPIRATORY_TRACT | Status: DC | PRN

## 2017-01-26 MED ORDER — LORAZEPAM 2 MG/ML IJ SOLN
2.0000 mg | Freq: Once | INTRAMUSCULAR | Status: AC
  Administered 2017-01-26: 2 mg via INTRAVENOUS
  Filled 2017-01-26: qty 1

## 2017-01-26 MED ORDER — DILTIAZEM HCL 25 MG/5ML IV SOLN
20.0000 mg | Freq: Once | INTRAVENOUS | Status: AC
  Administered 2017-01-27: 20 mg via INTRAVENOUS

## 2017-01-26 MED ORDER — ALBUTEROL (5 MG/ML) CONTINUOUS INHALATION SOLN
10.0000 mg/h | INHALATION_SOLUTION | Freq: Once | RESPIRATORY_TRACT | Status: AC
  Administered 2017-01-26: 10 mg/h via RESPIRATORY_TRACT
  Filled 2017-01-26: qty 20

## 2017-01-26 MED ORDER — DEXTROSE 5 % IV SOLN
1.0000 g | Freq: Once | INTRAVENOUS | Status: AC
  Administered 2017-01-26: 1 g via INTRAVENOUS
  Filled 2017-01-26: qty 10

## 2017-01-26 MED ORDER — ONDANSETRON HCL 4 MG/2ML IJ SOLN
4.0000 mg | Freq: Once | INTRAMUSCULAR | Status: AC
  Administered 2017-01-26: 4 mg via INTRAVENOUS
  Filled 2017-01-26: qty 2

## 2017-01-26 MED ORDER — IPRATROPIUM BROMIDE 0.02 % IN SOLN
0.5000 mg | Freq: Once | RESPIRATORY_TRACT | Status: AC
  Administered 2017-01-26: 0.5 mg via RESPIRATORY_TRACT
  Filled 2017-01-26: qty 2.5

## 2017-01-26 MED ORDER — DEXTROSE 5 % IV SOLN
5.0000 mg/h | INTRAVENOUS | Status: DC
  Administered 2017-01-26: 5 mg/h via INTRAVENOUS
  Filled 2017-01-26: qty 100
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
## 2017-01-26 NOTE — ED Notes (Signed)
Patient to xray via WC
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
## 2017-01-26 NOTE — ED Triage Notes (Signed)
Pt reports to ER for evaluation of progressively worsening shortness of breath with chest pressure onset Saturday night. States has been using inhalers at home without improvement. Pt labored at triage. SpO2 90% on RA. Inspiratory wheezing noted in lower lobes. Pt is a/o x4.
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
## 2017-01-26 NOTE — ED Provider Notes (Addendum)
Pt seen and examined. Discussed with Resident. Pt with sx since Saturday (5 days) despite home nebs. Has been in ICU with exacerbation of COPD before. C/o marked increase in sputum--green, no hemoptysis.  On exam pt in mild distress. Globally diminished and prolonged breath sounds.Receiving CAT. Radiology read of CXR is bronchitis v. Developing pneumonia. Agree with steroids, BLCx, antibiotics, bronchodilators. Pt with marked increase work of breathing. Will likely require admission.  22:31: Examined with Dr. Lorin Mercy. Pt now with HR 155. EKG with AF with RVR. Pt still on Bipap, and states that her breathing feels better. Continues to complain of pain.   Plan ABG, Ativan for anxiety, Diltiazem bolus and qtt.  CRITICAL CARE Performed by: Tanna Furry JOSEPH   Total critical care time: 30 minutes. Including:  Diltiazem bolus and infusion, repeat bolus. Repeat evaluations for response to therapy.  Critical care time was exclusive of separately billable procedures and treating other patients.  Critical care was necessary to treat or prevent imminent or life-threatening deterioration.  Critical care was time spent personally by me on the following activities: development of treatment plan with patient and/or surrogate as well as nursing, discussions with consultants, evaluation of patient's response to treatment, examination of patient, obtaining history from patient or surrogate, ordering and performing treatments and interventions, ordering and review of laboratory studies, ordering and review of radiographic studies, pulse oximetry and re-evaluation of patient's condition.    Tanna Furry, MD 01/26/17 2018    Tanna Furry, MD 01/26/17 9021    Tanna Furry, MD 01/27/17 [phone]

## 2017-01-27 ENCOUNTER — Inpatient Hospital Stay (HOSPITAL_COMMUNITY): Payer: Medicare Other

## 2017-01-27 DIAGNOSIS — K3189 Other diseases of stomach and duodenum: Secondary | ICD-10-CM | POA: Diagnosis not present

## 2017-01-27 DIAGNOSIS — I2510 Atherosclerotic heart disease of native coronary artery without angina pectoris: Secondary | ICD-10-CM | POA: Diagnosis present

## 2017-01-27 DIAGNOSIS — N183 Chronic kidney disease, stage 3 (moderate): Secondary | ICD-10-CM | POA: Diagnosis present

## 2017-01-27 DIAGNOSIS — K921 Melena: Secondary | ICD-10-CM | POA: Diagnosis not present

## 2017-01-27 DIAGNOSIS — B379 Candidiasis, unspecified: Secondary | ICD-10-CM | POA: Diagnosis not present

## 2017-01-27 DIAGNOSIS — K922 Gastrointestinal hemorrhage, unspecified: Secondary | ICD-10-CM | POA: Diagnosis not present

## 2017-01-27 DIAGNOSIS — R197 Diarrhea, unspecified: Secondary | ICD-10-CM | POA: Diagnosis not present

## 2017-01-27 DIAGNOSIS — I4891 Unspecified atrial fibrillation: Secondary | ICD-10-CM | POA: Diagnosis not present

## 2017-01-27 DIAGNOSIS — B3781 Candidal esophagitis: Secondary | ICD-10-CM | POA: Diagnosis present

## 2017-01-27 DIAGNOSIS — I130 Hypertensive heart and chronic kidney disease with heart failure and stage 1 through stage 4 chronic kidney disease, or unspecified chronic kidney disease: Secondary | ICD-10-CM | POA: Diagnosis present

## 2017-01-27 DIAGNOSIS — F419 Anxiety disorder, unspecified: Secondary | ICD-10-CM | POA: Diagnosis present

## 2017-01-27 DIAGNOSIS — K259 Gastric ulcer, unspecified as acute or chronic, without hemorrhage or perforation: Secondary | ICD-10-CM | POA: Diagnosis not present

## 2017-01-27 DIAGNOSIS — Z955 Presence of coronary angioplasty implant and graft: Secondary | ICD-10-CM | POA: Diagnosis not present

## 2017-01-27 DIAGNOSIS — J180 Bronchopneumonia, unspecified organism: Secondary | ICD-10-CM | POA: Diagnosis not present

## 2017-01-27 DIAGNOSIS — I10 Essential (primary) hypertension: Secondary | ICD-10-CM | POA: Diagnosis not present

## 2017-01-27 DIAGNOSIS — E1121 Type 2 diabetes mellitus with diabetic nephropathy: Secondary | ICD-10-CM | POA: Diagnosis not present

## 2017-01-27 DIAGNOSIS — J189 Pneumonia, unspecified organism: Secondary | ICD-10-CM | POA: Diagnosis not present

## 2017-01-27 DIAGNOSIS — I480 Paroxysmal atrial fibrillation: Secondary | ICD-10-CM | POA: Diagnosis present

## 2017-01-27 DIAGNOSIS — Z681 Body mass index (BMI) 19 or less, adult: Secondary | ICD-10-CM | POA: Diagnosis not present

## 2017-01-27 DIAGNOSIS — J9621 Acute and chronic respiratory failure with hypoxia: Secondary | ICD-10-CM | POA: Diagnosis not present

## 2017-01-27 DIAGNOSIS — J9690 Respiratory failure, unspecified, unspecified whether with hypoxia or hypercapnia: Secondary | ICD-10-CM | POA: Diagnosis not present

## 2017-01-27 DIAGNOSIS — J9601 Acute respiratory failure with hypoxia: Secondary | ICD-10-CM | POA: Diagnosis not present

## 2017-01-27 DIAGNOSIS — G2581 Restless legs syndrome: Secondary | ICD-10-CM | POA: Diagnosis present

## 2017-01-27 DIAGNOSIS — J441 Chronic obstructive pulmonary disease with (acute) exacerbation: Secondary | ICD-10-CM | POA: Diagnosis not present

## 2017-01-27 DIAGNOSIS — K319 Disease of stomach and duodenum, unspecified: Secondary | ICD-10-CM | POA: Diagnosis present

## 2017-01-27 DIAGNOSIS — E46 Unspecified protein-calorie malnutrition: Secondary | ICD-10-CM | POA: Diagnosis present

## 2017-01-27 DIAGNOSIS — E1122 Type 2 diabetes mellitus with diabetic chronic kidney disease: Secondary | ICD-10-CM | POA: Diagnosis present

## 2017-01-27 DIAGNOSIS — Z87442 Personal history of urinary calculi: Secondary | ICD-10-CM | POA: Diagnosis not present

## 2017-01-27 DIAGNOSIS — K254 Chronic or unspecified gastric ulcer with hemorrhage: Secondary | ICD-10-CM | POA: Diagnosis not present

## 2017-01-27 DIAGNOSIS — A419 Sepsis, unspecified organism: Secondary | ICD-10-CM | POA: Diagnosis not present

## 2017-01-27 DIAGNOSIS — D500 Iron deficiency anemia secondary to blood loss (chronic): Secondary | ICD-10-CM | POA: Diagnosis present

## 2017-01-27 DIAGNOSIS — J440 Chronic obstructive pulmonary disease with acute lower respiratory infection: Secondary | ICD-10-CM | POA: Diagnosis not present

## 2017-01-27 DIAGNOSIS — Z4682 Encounter for fitting and adjustment of non-vascular catheter: Secondary | ICD-10-CM | POA: Diagnosis not present

## 2017-01-27 DIAGNOSIS — D649 Anemia, unspecified: Secondary | ICD-10-CM | POA: Diagnosis not present

## 2017-01-27 DIAGNOSIS — D631 Anemia in chronic kidney disease: Secondary | ICD-10-CM | POA: Diagnosis present

## 2017-01-27 DIAGNOSIS — R0902 Hypoxemia: Secondary | ICD-10-CM | POA: Diagnosis not present

## 2017-01-27 DIAGNOSIS — I5042 Chronic combined systolic (congestive) and diastolic (congestive) heart failure: Secondary | ICD-10-CM | POA: Diagnosis present

## 2017-01-27 DIAGNOSIS — I959 Hypotension, unspecified: Secondary | ICD-10-CM | POA: Diagnosis present

## 2017-01-27 DIAGNOSIS — Z781 Physical restraint status: Secondary | ICD-10-CM | POA: Diagnosis not present

## 2017-01-27 DIAGNOSIS — G9340 Encephalopathy, unspecified: Secondary | ICD-10-CM | POA: Diagnosis present

## 2017-01-27 LAB — GLUCOSE, CAPILLARY
GLUCOSE-CAPILLARY: 155 mg/dL — AB (ref 65–99)
GLUCOSE-CAPILLARY: 171 mg/dL — AB (ref 65–99)
Glucose-Capillary: 252 mg/dL — ABNORMAL HIGH (ref 65–99)
Glucose-Capillary: 130 mg/dL — ABNORMAL HIGH (ref 65–99)
Glucose-Capillary: 200 mg/dL — ABNORMAL HIGH (ref 65–99)

## 2017-01-27 LAB — BASIC METABOLIC PANEL
ANION GAP: 10 (ref 5–15)
BUN: 16 mg/dL (ref 6–20)
CALCIUM: 8.7 mg/dL — AB (ref 8.9–10.3)
CO2: 26 mmol/L (ref 22–32)
Chloride: 100 mmol/L — ABNORMAL LOW (ref 101–111)
Creatinine, Ser: 0.97 mg/dL (ref 0.44–1.00)
GFR calc Af Amer: >60 mL/min (ref >60)
GFR, EST NON AFRICAN AMERICAN: 57 mL/min — AB (ref >60)
GLUCOSE: 151 mg/dL — AB (ref 65–99)
Potassium: 3.2 mmol/L — ABNORMAL LOW (ref 3.5–5.1)
SODIUM: 136 mmol/L (ref 135–145)

## 2017-01-27 LAB — TSH: TSH: 0.131 u[IU]/mL — AB (ref 0.350–4.500)

## 2017-01-27 LAB — APTT: aPTT: 36 seconds (ref 24–36)

## 2017-01-27 LAB — TROPONIN I
TROPONIN I: 0.04 ng/mL — AB (ref <0.03)
TROPONIN I: 0.04 ng/mL — AB (ref <0.03)
Troponin I: 0.05 ng/mL (ref <0.03)

## 2017-01-27 LAB — RAPID URINE DRUG SCREEN, HOSP PERFORMED
AMPHETAMINES: NOT DETECTED
BENZODIAZEPINES: POSITIVE — AB
Barbiturates: NOT DETECTED
Cocaine: NOT DETECTED
OPIATES: NOT DETECTED
Tetrahydrocannabinol: NOT DETECTED

## 2017-01-27 LAB — POCT I-STAT 3, ART BLOOD GAS (G3+)
ACID-BASE DEFICIT: 2 mmol/L (ref 0.0–2.0)
Bicarbonate: 26.8 mmol/L (ref 20.0–28.0)
O2 SAT: 99 %
PH ART: 7.236 — AB (ref 7.350–7.450)
PO2 ART: 137 mmHg — AB (ref 83.0–108.0)
TCO2: 29 mmol/L (ref 0–100)
pCO2 arterial: 62.6 mmHg — ABNORMAL HIGH (ref 32.0–48.0)

## 2017-01-27 LAB — I-STAT CG4 LACTIC ACID, ED: Lactic Acid, Venous: 1 mmol/L (ref 0.5–1.9)

## 2017-01-27 LAB — BLOOD GAS, ARTERIAL
Acid-Base Excess: 0.3 mmol/L (ref 0.0–2.0)
BICARBONATE: 25.6 mmol/L (ref 20.0–28.0)
Drawn by: 24486
FIO2: 40
O2 SAT: 98.9 %
PEEP: 5 cmH2O
Patient temperature: 97.9
RATE: 18 resp/min
VT: 400 mL
pCO2 arterial: 49.5 mmHg — ABNORMAL HIGH (ref 32.0–48.0)
pH, Arterial: 7.331 — ABNORMAL LOW (ref 7.350–7.450)
pO2, Arterial: 158 mmHg — ABNORMAL HIGH (ref 83.0–108.0)

## 2017-01-27 LAB — PHOSPHORUS
Phosphorus: 3.5 mg/dL (ref 2.5–4.6)
Phosphorus: 3.5 mg/dL (ref 2.5–4.6)
Phosphorus: 2.8 mg/dL (ref 2.5–4.6)

## 2017-01-27 LAB — PROCALCITONIN
Procalcitonin: 0.72 ng/mL
Procalcitonin: 0.64 ng/mL

## 2017-01-27 LAB — CBC
HCT: 28.5 % — ABNORMAL LOW (ref 36.0–46.0)
HEMOGLOBIN: 8.7 g/dL — AB (ref 12.0–15.0)
MCH: 24.7 pg — ABNORMAL LOW (ref 26.0–34.0)
MCHC: 30.5 g/dL (ref 30.0–36.0)
MCV: 81 fL (ref 78.0–100.0)
Platelets: 403 10*3/uL — ABNORMAL HIGH (ref 150–400)
RBC: 3.52 MIL/uL — ABNORMAL LOW (ref 3.87–5.11)
RDW: 14.1 % (ref 11.5–15.5)
WBC: 13.2 10*3/uL — AB (ref 4.0–10.5)

## 2017-01-27 LAB — PROTIME-INR
INR: 1.12
PROTHROMBIN TIME: 14.5 s (ref 11.4–15.2)

## 2017-01-27 LAB — T4, FREE: Free T4: 1.38 ng/dL — ABNORMAL HIGH (ref 0.61–1.12)

## 2017-01-27 LAB — ECHOCARDIOGRAM COMPLETE
Height: 62 in
WEIGHTICAEL: 1746.04 [oz_av]

## 2017-01-27 LAB — MRSA PCR SCREENING: MRSA BY PCR: NEGATIVE

## 2017-01-27 LAB — MAGNESIUM
MAGNESIUM: 2.4 mg/dL (ref 1.7–2.4)
Magnesium: 2.5 mg/dL — ABNORMAL HIGH (ref 1.7–2.4)

## 2017-01-27 LAB — LACTIC ACID, PLASMA
LACTIC ACID, VENOUS: 1 mmol/L (ref 0.5–1.9)
Lactic Acid, Venous: 1.5 mmol/L (ref 0.5–1.9)

## 2017-01-27 LAB — INFLUENZA PANEL BY PCR (TYPE A & B)
INFLAPCR: NEGATIVE
INFLBPCR: NEGATIVE

## 2017-01-27 LAB — STREP PNEUMONIAE URINARY ANTIGEN: Strep Pneumo Urinary Antigen: NEGATIVE

## 2017-01-27 MED ORDER — MIDAZOLAM HCL 2 MG/2ML IJ SOLN
1.0000 mg | INTRAMUSCULAR | Status: DC | PRN
  Administered 2017-01-27 – 2017-01-28 (×5): 1 mg via INTRAVENOUS
  Filled 2017-01-27 (×5): qty 2

## 2017-01-27 MED ORDER — ASPIRIN EC 81 MG PO TBEC: 81.0000 mg | DELAYED_RELEASE_TABLET | Freq: Every day | ORAL | Status: DC

## 2017-01-27 MED ORDER — ACETAMINOPHEN 325 MG PO TABS
650.0000 mg | ORAL_TABLET | ORAL | Status: DC | PRN
  Administered 2017-01-28: 650 mg via ORAL
  Filled 2017-01-27: qty 2

## 2017-01-27 MED ORDER — FENTANYL CITRATE (PF) 100 MCG/2ML IJ SOLN
INTRAMUSCULAR | Status: AC
  Filled 2017-01-27: qty 2

## 2017-01-27 MED ORDER — ATORVASTATIN CALCIUM 10 MG PO TABS
10.0000 mg | ORAL_TABLET | Freq: Every day | ORAL | Status: DC
  Administered 2017-01-27 – 2017-02-01 (×6): 10 mg via ORAL
  Filled 2017-01-27 (×6): qty 1

## 2017-01-27 MED ORDER — CLOPIDOGREL BISULFATE 75 MG PO TABS
75.0000 mg | ORAL_TABLET | Freq: Every day | ORAL | Status: DC
  Administered 2017-01-27 – 2017-01-30 (×4): 75 mg via ORAL
  Filled 2017-01-27 (×4): qty 1

## 2017-01-27 MED ORDER — PENTOXIFYLLINE ER 400 MG PO TBCR
400.0000 mg | EXTENDED_RELEASE_TABLET | Freq: Every day | ORAL | Status: DC
Start: 2017-01-27 — End: 2017-02-02
  Administered 2017-01-29 – 2017-02-02 (×5): 400 mg via ORAL
  Filled 2017-01-27 (×7): qty 1

## 2017-01-27 MED ORDER — MIDAZOLAM HCL 5 MG/5ML IJ SOLN
INTRAMUSCULAR | Status: AC | PRN
  Administered 2017-01-27: 2 mg via INTRAVENOUS

## 2017-01-27 MED ORDER — IPRATROPIUM-ALBUTEROL 0.5-2.5 (3) MG/3ML IN SOLN
3.0000 mL | Freq: Four times a day (QID) | RESPIRATORY_TRACT | Status: DC
  Administered 2017-01-27 – 2017-01-28 (×7): 3 mL via RESPIRATORY_TRACT
  Filled 2017-01-27 (×7): qty 3

## 2017-01-27 MED ORDER — ENOXAPARIN SODIUM 60 MG/0.6ML ~~LOC~~ SOLN
50.0000 mg | SUBCUTANEOUS | Status: AC
Start: 2017-01-27 — End: 2017-01-27
  Administered 2017-01-27: 50 mg via SUBCUTANEOUS
  Filled 2017-01-27 (×2): qty 0.6

## 2017-01-27 MED ORDER — AMIODARONE LOAD VIA INFUSION
150.0000 mg | Freq: Once | INTRAVENOUS | Status: AC
  Administered 2017-01-27: 150 mg via INTRAVENOUS
  Filled 2017-01-27: qty 83.34

## 2017-01-27 MED ORDER — FENTANYL BOLUS VIA INFUSION
25.0000 ug | INTRAVENOUS | Status: DC | PRN
  Administered 2017-01-27 (×3): 25 ug via INTRAVENOUS
  Filled 2017-01-27: qty 25

## 2017-01-27 MED ORDER — FENTANYL CITRATE (PF) 100 MCG/2ML IJ SOLN
50.0000 ug | Freq: Once | INTRAMUSCULAR | Status: AC
  Administered 2017-01-27: 50 ug via INTRAVENOUS
  Filled 2017-01-27: qty 2

## 2017-01-27 MED ORDER — ROCURONIUM BROMIDE 50 MG/5ML IV SOLN
INTRAVENOUS | Status: AC | PRN
  Administered 2017-01-27: 50 mg via INTRAVENOUS

## 2017-01-27 MED ORDER — ISOSORBIDE MONONITRATE ER 30 MG PO TB24
30.0000 mg | ORAL_TABLET | Freq: Every day | ORAL | Status: DC
  Administered 2017-01-29 – 2017-02-02 (×5): 30 mg via ORAL
  Filled 2017-01-27 (×6): qty 1

## 2017-01-27 MED ORDER — POTASSIUM CHLORIDE 20 MEQ/15ML (10%) PO SOLN
30.0000 meq | ORAL | Status: AC
  Administered 2017-01-27 (×2): 30 meq
  Filled 2017-01-27 (×2): qty 30

## 2017-01-27 MED ORDER — DEXTROSE 5 % IV SOLN
500.0000 mg | INTRAVENOUS | Status: DC
  Administered 2017-01-27: 500 mg via INTRAVENOUS
  Filled 2017-01-27: qty 500

## 2017-01-27 MED ORDER — PRO-STAT SUGAR FREE PO LIQD
30.0000 mL | Freq: Two times a day (BID) | ORAL | Status: DC
  Administered 2017-01-27: 30 mL
  Filled 2017-01-27: qty 30

## 2017-01-27 MED ORDER — LORAZEPAM 2 MG/ML IJ SOLN: 1.0000 mg | INTRAMUSCULAR | Status: DC | PRN

## 2017-01-27 MED ORDER — ONDANSETRON HCL 4 MG/2ML IJ SOLN
4.0000 mg | Freq: Four times a day (QID) | INTRAMUSCULAR | Status: DC | PRN
  Administered 2017-01-29: 4 mg via INTRAVENOUS
  Filled 2017-01-27: qty 2

## 2017-01-27 MED ORDER — ENOXAPARIN SODIUM 60 MG/0.6ML ~~LOC~~ SOLN
50.0000 mg | Freq: Two times a day (BID) | SUBCUTANEOUS | Status: DC
  Administered 2017-01-27 – 2017-01-29 (×5): 50 mg via SUBCUTANEOUS
  Filled 2017-01-27 (×6): qty 0.6

## 2017-01-27 MED ORDER — DILTIAZEM HCL 100 MG IV SOLR: 5.0000 mg/h | INTRAVENOUS | Status: DC

## 2017-01-27 MED ORDER — FENTANYL 2500MCG IN NS 250ML (10MCG/ML) PREMIX INFUSION
25.0000 ug/h | INTRAVENOUS | Status: DC
  Administered 2017-01-27: 25 ug/h via INTRAVENOUS
  Filled 2017-01-27: qty 250

## 2017-01-27 MED ORDER — INSULIN ASPART 100 UNIT/ML ~~LOC~~ SOLN: 0.0000 [IU] | Freq: Three times a day (TID) | SUBCUTANEOUS | Status: DC

## 2017-01-27 MED ORDER — AMIODARONE HCL IN DEXTROSE 360-4.14 MG/200ML-% IV SOLN
30.0000 mg/h | INTRAVENOUS | Status: DC
  Filled 2017-01-27: qty 200

## 2017-01-27 MED ORDER — PANTOPRAZOLE SODIUM 40 MG IV SOLR
40.0000 mg | Freq: Every day | INTRAVENOUS | Status: DC
  Administered 2017-01-27 – 2017-01-29 (×3): 40 mg via INTRAVENOUS
  Filled 2017-01-27 (×3): qty 40

## 2017-01-27 MED ORDER — INSULIN ASPART 100 UNIT/ML ~~LOC~~ SOLN
0.0000 [IU] | SUBCUTANEOUS | Status: DC
  Administered 2017-01-27: 5 [IU] via SUBCUTANEOUS
  Administered 2017-01-27: 1 [IU] via SUBCUTANEOUS
  Administered 2017-01-27 (×3): 2 [IU] via SUBCUTANEOUS
  Administered 2017-01-28: 1 [IU] via SUBCUTANEOUS
  Administered 2017-01-28: 5 [IU] via SUBCUTANEOUS
  Administered 2017-01-28: 2 [IU] via SUBCUTANEOUS
  Administered 2017-01-28: 7 [IU] via SUBCUTANEOUS
  Administered 2017-01-29 – 2017-01-30 (×10): 2 [IU] via SUBCUTANEOUS

## 2017-01-27 MED ORDER — VITAL HIGH PROTEIN PO LIQD: 1000.0000 mL | ORAL | Status: DC

## 2017-01-27 MED ORDER — MIDAZOLAM HCL 2 MG/2ML IJ SOLN
INTRAMUSCULAR | Status: AC
  Filled 2017-01-27: qty 2

## 2017-01-27 MED ORDER — PRO-STAT SUGAR FREE PO LIQD
30.0000 mL | Freq: Every day | ORAL | Status: DC
  Administered 2017-01-28: 30 mL

## 2017-01-27 MED ORDER — ADULT MULTIVITAMIN LIQUID CH
15.0000 mL | Freq: Every day | ORAL | Status: DC
  Administered 2017-01-27 – 2017-01-29 (×3): 15 mL
  Filled 2017-01-27 (×3): qty 15

## 2017-01-27 MED ORDER — ASPIRIN 81 MG PO CHEW
81.0000 mg | CHEWABLE_TABLET | Freq: Every day | ORAL | Status: DC
  Administered 2017-01-27 – 2017-01-31 (×5): 81 mg via ORAL
  Filled 2017-01-27 (×5): qty 1

## 2017-01-27 MED ORDER — ROPINIROLE HCL 1 MG PO TABS
2.0000 mg | ORAL_TABLET | Freq: Every day | ORAL | Status: DC
  Administered 2017-01-27 – 2017-02-01 (×6): 2 mg via ORAL
  Filled 2017-01-27 (×6): qty 2

## 2017-01-27 MED ORDER — FENTANYL CITRATE (PF) 100 MCG/2ML IJ SOLN
INTRAMUSCULAR | Status: AC | PRN
  Administered 2017-01-27: 100 ug via INTRAVENOUS

## 2017-01-27 MED ORDER — ORAL CARE MOUTH RINSE
15.0000 mL | Freq: Two times a day (BID) | OROMUCOSAL | Status: DC
  Administered 2017-01-27 – 2017-01-30 (×7): 15 mL via OROMUCOSAL

## 2017-01-27 MED ORDER — AMIODARONE HCL IN DEXTROSE 360-4.14 MG/200ML-% IV SOLN
60.0000 mg/h | INTRAVENOUS | Status: AC
  Administered 2017-01-27 (×2): 60 mg/h via INTRAVENOUS
  Filled 2017-01-27: qty 200

## 2017-01-27 MED ORDER — ETOMIDATE 2 MG/ML IV SOLN
INTRAVENOUS | Status: AC | PRN
  Administered 2017-01-27: 20 mg via INTRAVENOUS

## 2017-01-27 MED ORDER — VITAL AF 1.2 CAL PO LIQD
1000.0000 mL | ORAL | Status: DC
  Administered 2017-01-27: 1000 mL

## 2017-01-27 MED ORDER — PANTOPRAZOLE SODIUM 40 MG PO TBEC: 40.0000 mg | DELAYED_RELEASE_TABLET | Freq: Every day | ORAL | Status: DC

## 2017-01-27 MED ORDER — MIDAZOLAM HCL 2 MG/2ML IJ SOLN
1.0000 mg | INTRAMUSCULAR | Status: DC | PRN
  Filled 2017-01-27: qty 2

## 2017-01-27 MED ORDER — CHLORHEXIDINE GLUCONATE 0.12 % MT SOLN
15.0000 mL | Freq: Two times a day (BID) | OROMUCOSAL | Status: DC
  Administered 2017-01-27 – 2017-02-01 (×8): 15 mL via OROMUCOSAL
  Filled 2017-01-27 (×6): qty 15

## 2017-01-27 MED ORDER — DEXTROSE 5 % IV SOLN
1.0000 g | INTRAVENOUS | Status: DC
  Administered 2017-01-27: 1 g via INTRAVENOUS
  Filled 2017-01-27: qty 10
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
## 2017-01-27 NOTE — Progress Notes (Signed)
Called by RT, patient's WOB is increasing and RR is elevated with a low Tv.[name]  HR in the 150's and patient is desaturating.  She is able to speak some and asked that we speak with her son to inform him.  Spoke with son over the phone.  Some questions answered but due to the emergent nature of the situation.  Patient clearly elected full code and son was informed.  Patient was intubated and stabilized.  Amiodarone drip started.  Fentanyl drip and versed pushes started.  CXR and ABG ordered and vent orders entered.  Called patient's son Courtlyn Aki back, went straight to voice mail and voice mail left with contact information.  No need for abx at this time.  D/C cardizem.  2D echo ordered and PCCM will take on our service.  The patient is critically ill with multiple organ systems failure and requires high complexity decision making for assessment and support, frequent evaluation and titration of therapies, application of advanced monitoring technologies and extensive interpretation of multiple databases.   Critical Care Time devoted to patient care services described in this note is  60  Minutes. This time reflects time of care of this signee Dr [name]. This critical care time does not reflect procedure time, or teaching time or supervisory time of PA/NP/Med student/Med Resident etc but could involve care discussion time.  Rush Farmer, M.D. [facility] Pulmonary/Critical Care Medicine. Pager: [phone]. After hours pager: [phone].

## 2017-01-27 NOTE — Progress Notes (Addendum)
Initial Nutrition Assessment  DOCUMENTATION CODES:   Not applicable  INTERVENTION:    Initiate Vital AF 1.2 at goal rate of 35 ml/h (840 ml per day) and Prostat 30 ml daily   Liquid MVI daily  TF regimen to provide 1108 kcals, 78 gm protein, 681 ml free water daily  NUTRITION DIAGNOSIS:   Inadequate oral intake related to inability to eat as evidenced by NPO status  GOAL:   Patient will meet greater than or equal to 90% of their needs  MONITOR:   Vent status, TF tolerance, Labs, Weight trends, Skin, I & O's  REASON FOR ASSESSMENT:   Consult Enteral/tube feeding initiation and management  ASSESSMENT:   71 y.o. Female with medical history significant of ASCVD s/p stent placement and abdominal aorta angiogram with 50% stenosis per patient report with h/o DM, HTN, HLD, and COPD not on home O2 presenting with SOB.  Patient is currently intubated on ventilator support MV: 7.5 L/min Temp (24hrs), Avg:97.5 F (36.4 C), Min:96.4 F (35.8 C), Max:98 F (36.7 C)  OGT in place  Pt presented to ED for SOB, chest tightness and productive cough. Placed on BiPAP.  Developed AF RVR.  Started on Diltiazem and intubated. CXR suggested bibasilar infiltrates c/w bronchitis or bronchopneumonia. Medications reviewed.  Labs reviewed.  Potassium 3.2 (L). CBG's [phone].  Unable to complete Nutrition-Focused physical exam at this time.  Pt undergoing 2D Echocardiogram.   Diet Order:  Diet NPO time specified Except for: Sips with Meds  Skin:  Reviewed, no issues  Last BM:  N/A  Height:   Ht Readings from Last 1 Encounters:  01/27/17 5\' 2"  (1.575 m)   Weight:   Wt Readings from Last 1 Encounters:  01/27/17 109 lb 2 oz (49.5 kg)   Ideal Body Weight:  50 kg  BMI:  Body mass index is 19.96 kg/m.  Estimated Nutritional Needs:   Kcal:  2694  Protein:  75-90 gm  Fluid:  per MD  EDUCATION NEEDS:   No education needs identified at this time  Arthur Holms, RD,  LDN Pager #: [phone] After-Hours Pager #: [phone]

## 2017-01-27 NOTE — Progress Notes (Signed)
eLink Physician-Brief Progress Note Patient Name: Kimberly Downs DOB: [birth year] MRN: [id number]   Date of Service  01/27/2017  HPI/Events of Note  Multiple issues: 1. Agitation - request for wrist restraints, 2. Request to change blood glucose to Q 4 hours and 3. ABG on 60%/PRVC 14/TV 400/P 5 = 7.236/62.6/137.0  eICU Interventions  Will order: 1. Bilateral soft wrist restraints. 2. Blood glucose checks Q 4 hours. 3. Increase PRVC rate to 18. 4. ABG at 5:30 AM.     Intervention Category Major Interventions: Acid-Base disturbance - evaluation and management;Respiratory failure - evaluation and management Minor Interventions: Agitation / anxiety - evaluation and management  Kimberly Downs 01/27/2017, 4:30 AM

## 2017-01-27 NOTE — ED Notes (Signed)
CRITICAL VALUE ALERT  Critical Value:  Trop 0.05

## 2017-01-27 NOTE — Progress Notes (Signed)
  Echocardiogram 2D Echocardiogram has been performed.  Kumar Falwell L Androw 01/27/2017, 8:57 AM

## 2017-01-27 NOTE — Procedures (Signed)
Intubation Procedure Note Kimberly Downs [id number] Nov 19, 1945  Procedure: Intubation Indications: Respiratory insufficiency  Procedure Details Consent: Risks of procedure as well as the alternatives and risks of each were explained to the (patient/caregiver).  Consent for procedure obtained. Time Out: Verified patient identification, verified procedure, site/side was marked, verified correct patient position, special equipment/implants available, medications/allergies/relevent history reviewed, required imaging and test results available.  Performed  Maximum sterile technique was used including gloves, hand hygiene and mask.  MAC    Evaluation Hemodynamic Status: BP stable throughout; O2 sats: stable throughout Patient's Current Condition: stable Complications: No apparent complications Patient did tolerate procedure well. Chest X-ray ordered to verify placement.  CXR: pending.   Kimberly Downs 01/27/2017

## 2017-01-27 NOTE — Progress Notes (Signed)
PULMONARY / CRITICAL CARE MEDICINE   Name: Kimberly Downs MRN: [id number] DOB: Jan 10, 1946    ADMISSION DATE:  01/26/2017 CONSULTATION DATE:  01/27/2017  REFERRING MD:  Dr. Jeneen Rinks  CHIEF COMPLAINT:  Dyspnea   Brief:   71 y.o. female with a PMH of ASCVD s/p stents, COPD, DM, HTN, HLD, with 50-75 pack per year smoking history.   Presents to ED on 01/26/17 for SOB and chest tightness x 5 days, with productive cough. Placed on BiPAP for mild increase in WOB.  CXR suggested bibasilar infiltrates c/w bronchitis or bronchopneumonia.  While in ED, she developed AF RVR for which she was started on diltiazem and ultimately intubated.   SUBJECTIVE:  Alert on minimal fentanyl gtt and mechanical ventilation. Afib with RVR overnight, now on ammo gtt  VITAL SIGNS: BP 125/83   Pulse 72   Temp 97.9 F (36.6 C) (Oral)   Resp (!) 28   Ht 5\' 2"  (1.575 m)   Wt 49.5 kg (109 lb 2 oz)   SpO2 98%   BMI 19.96 kg/m   HEMODYNAMICS:    VENTILATOR SETTINGS: Vent Mode: CPAP;PCV FiO2 (%):  [40 %-60 %] 40 % Set Rate:  [14 bmp-18 bmp] 18 bmp Vt Set:  [400 mL] 400 mL PEEP:  [5 cmH20] 5 cmH20 Pressure Support:  [10 cmH20] 10 cmH20 Plateau Pressure:  [16 cmH20-17 cmH20] 16 cmH20  INTAKE / OUTPUT: I/O last 3 completed shifts: In: 2100.3 [I.V.:250.3; IV Piggyback:1850] Out: 330 [Urine:330]  PHYSICAL EXAMINATION: General appearance: female, no distress Eyes:  PERRL, EOMI bilaterally. Mouth:  membranes and no mucosal ulcerations Neck: Trachea midline; neck supple, no JVD Lungs/chest: diminished, no wheeze, with mild respiratory distress  CV: Irregular, no MRGs  Abdomen: Soft, non-tender; active bowel sounds  Extremities: No peripheral edema or extremity lymphadenopathy Skin: Normal temperature, turgor and texture; no rash, ulcers or subcutaneous nodules Psych: Alert, follows commands, moves all extremities   LABS:  BMET  Recent Labs Lab 01/26/17 1852  NA 132*  K 3.4*  CL 95*  CO2 24  BUN 13   CREATININE 0.94  GLUCOSE 143*    Electrolytes  Recent Labs Lab 01/26/17 1852  CALCIUM 9.0    CBC  Recent Labs Lab 01/26/17 1852  WBC 14.0*  HGB 9.5*  HCT 30.4*  PLT 475*    Coag's  Recent Labs Lab 01/27/17 0304  APTT 36  INR 1.12    Sepsis Markers  Recent Labs Lab 01/27/17 0023 01/27/17 0059 01/27/17 0304 01/27/17 0559  LATICACIDVEN  --  1.00 1.0 1.5  PROCALCITON 0.72  --  0.64  --     ABG  Recent Labs Lab 01/26/17 2231 01/27/17 0247  PHART 7.422 7.236*  PCO2ART 39.2 62.6*  PO2ART 119.0* 137.0*    Liver Enzymes No results for input(s): AST, ALT, ALKPHOS, BILITOT, ALBUMIN in the last 168 hours.  Cardiac Enzymes  Recent Labs Lab 01/27/17 0023 01/27/17 0559  TROPONINI 0.05* 0.04*    Glucose  Recent Labs Lab 01/27/17 0742  GLUCAP 130*    Imaging Dg Chest 2 View  Result Date: 01/26/2017 CLINICAL DATA:  71 y/o  F; shortness of breath. EXAM: CHEST  2 VIEW COMPARISON:  01/10/2017 chest radiograph FINDINGS: Normal cardiac silhouette. Aortic atherosclerosis with calcification. Stable emphysema and hyperinflation of lungs. Reticular and nodular opacities in the lung bases. Blunting of costal diaphragmatic angles, possible trace effusions. Chronic rib fractures. Bones are demineralized. No acute osseous abnormality is evident. IMPRESSION: 1. Reticulonodular opacities in the lung bases  probably represents developing acute bronchitis/bronchopneumonia. 2. Aortic atherosclerosis. 3. COPD. Electronically Signed   By: Kristine Garbe M.D.   On: 01/26/2017 19:50   Dg Chest Port 1 View  Result Date: 01/27/2017 CLINICAL DATA:  Check endotracheal tube placement EXAM: PORTABLE CHEST 1 VIEW COMPARISON:  01/27/2017 FINDINGS: Endotracheal tube and nasogastric catheter are noted. The endotracheal tube is noted just above the carina improved in positioning when compare with the prior exam. Cardiac shadow is stable. Stable interstitial changes are noted  bilaterally. No new focal infiltrate or effusion is seen. No bony abnormality is noted. IMPRESSION: Stable bilateral interstitial changes. Improved endotracheal tube positioning Electronically Signed   By: Inez Catalina M.D.   On: 01/27/2017 08:00   Portable Chest Xray  Result Date: 01/27/2017 CLINICAL DATA:  Endotracheal tube placement.  Initial encounter. EXAM: PORTABLE CHEST 1 VIEW COMPARISON:  Chest radiograph performed 01/26/2017 FINDINGS: The patient's endotracheal tube is seen ending 1-2 cm below the carina, at the right mainstem bronchus. This should be retracted 4-5 cm. The enteric tube is seen extending below the diaphragm. Vascular congestion is noted. Increased interstitial markings raise concern for mild interstitial edema. No pleural effusion or pneumothorax is seen The cardiomediastinal silhouette is borderline enlarged. No acute osseous abnormalities are identified. IMPRESSION: 1. Endotracheal tube seen ending 1-2 cm below the carina, at the right mainstem bronchus. This should be retracted 4-5 cm. 2. Vascular congestion and borderline cardiomegaly. Increased interstitial markings raise concern for mild interstitial edema. These results were called by telephone at the time of interpretation on 01/27/2017 at 2:16 am to Dr. Wyvonnia Dusky, who verbally acknowledged these results. Electronically Signed   By: Garald Balding M.D.   On: 01/27/2017 02:17    CULTURES: Influenza 5/30 > Blood 5/30 > Step Pneumo 5/30 > Negative  Sputum 5/31 >   ANTIBIOTICS: Azithromycin 5/30 >> Rocephin 5/30 >>   STUDIES:  CXR 5/30 > bibasilar infiltrates.  SIGNIFICANT EVENTS  5/30 > admit.  LINES/TUBES: ETT 5/31 >>  DISCUSSION: 71 year old female presents with respiratory distress secondary to +/CAP, +/-AECOPD  ASSESSMENT / PLAN:  PULMONARY A: Acute on Chronic Hypoxic Respiratory Failure +/-CAP, +/-AECOPD H/O Tobacco Dependence  P:   Vent Support  Wean as tolerated (currently on 10/5) - has  increased sputum production  VAP bundle  Trend ABG/CXR Continue Ipratropium / Levalbuterol and Solumedrol (60 BID)  CARDIOVASCULAR A:  Afib with RVR H/O HTN, HLD, CAD s/p Stents  P:  Cardiac Monitoring  Cardiology following  Continue Ammo gtt > okay for now however avoid long term  Continue Lipitor, ASA, Plavix  ECHO pending   RENAL A:   CKD stage 3  P:   Trend BMP Replace electrolytes as needed   GASTROINTESTINAL A:   Protein Calorie Malnutrition  P:   TF PPI  HEMATOLOGIC A:   Anemia of Chronic Diease  P:  Trend CBC  Maintain Hbg >7  INFECTIOUS A:   +/-CAP +/-AECOPD P:   Trend WBC and Fever Curve  Follow Culture Data Continue Rocephin/Azithromycin   ENDOCRINE A:   DM   P:   Trend Glucose  SSI  NEUROLOGIC A:   Acute Encephalopathy in setting of sedation  H/O Anxiety  P:   RASS goal: -1/-2 Wean Fentanyl gtt to achieve RASS PRN versed  Hold home Xanax    FAMILY  - Updates: no family at bedside   - Inter-disciplinary family meet or Palliative Care meeting due by:  02/02/2017   CC Time: 32 minutes  Hayden Pedro, AGACNP-BC [place] [facility]  Pgr: [phone]  PCCM Pgr: [phone]   STAFF NOTE: Linwood Dibbles, MD FACP have personally reviewed patient's available data, including medical history, events of note, physical examination and test results as part of my evaluation. I have discussed with resident/NP and other care providers such as pharmacist, RN and RRT. In addition, I personally evaluated patient and elicited key findings of: awake, on vent, cooperative, distant BS, fib HR wnl, abdo soft, pcxr c/w copd int chronic changes, clinically this is copd exac giving rise to arrythmia concern some diast dysfunction edema from fib rvr vs viral pneumonitis, ensure viral panela  Dn covering atypical illness with proper abx, last abg noted, dc sbt, assess MV needs on rest again, if amio continued would use short term  with lung dz, appears that treating resp failure has improved HR, get echo, lactic re assuring, no repeat needed, may need lasix, allow pos to even balance for now, I updated pt in room The patient is critically ill with multiple organ systems failure and requires high complexity decision making for assessment and support, frequent evaluation and titration of therapies, application of advanced monitoring technologies and extensive interpretation of multiple databases.   Critical Care Time devoted to patient care services described in this note is 30 Minutes. This time reflects time of care of this signee: Merrie Roof, MD FACP. This critical care time does not reflect procedure time, or teaching time or supervisory time of PA/NP/Med student/Med Resident etc but could involve care discussion time. Rest per NP/medical resident whose note is outlined above and that I agree with   Lavon Paganini. Titus Mould, MD, [place] Pgr: [facility] 01/27/2017 1:32 PM

## 2017-01-27 NOTE — Progress Notes (Signed)
eLink Physician-Brief Progress Note Patient Name: Kimberly Downs DOB: Jun 21, 1946 MRN: [id number]   Date of Service  01/27/2017  HPI/Events of Note  Intubated and ventilated - Request for Foley Catheter.   eICU Interventions  Will place Foley Catheter.      Intervention Category Intermediate Interventions: Other:  Sommer,Steven Cornelia Copa 01/27/2017, 2:51 AM

## 2017-01-28 ENCOUNTER — Inpatient Hospital Stay (HOSPITAL_COMMUNITY): Payer: Medicare Other

## 2017-01-28 LAB — GLUCOSE, CAPILLARY
GLUCOSE-CAPILLARY: 107 mg/dL — AB (ref 65–99)
GLUCOSE-CAPILLARY: 115 mg/dL — AB (ref 65–99)
GLUCOSE-CAPILLARY: 134 mg/dL — AB (ref 65–99)
GLUCOSE-CAPILLARY: 194 mg/dL — AB (ref 65–99)
GLUCOSE-CAPILLARY: 299 mg/dL — AB (ref 65–99)
GLUCOSE-CAPILLARY: 64 mg/dL — AB (ref 65–99)
Glucose-Capillary: 309 mg/dL — ABNORMAL HIGH (ref 65–99)
Glucose-Capillary: 197 mg/dL — ABNORMAL HIGH (ref 65–99)

## 2017-01-28 LAB — HEMOGLOBIN A1C
Hgb A1c MFr Bld: 7 % — ABNORMAL HIGH (ref 4.8–5.6)
MEAN PLASMA GLUCOSE: 154 mg/dL

## 2017-01-28 LAB — BASIC METABOLIC PANEL
Anion gap: 8 (ref 5–15)
BUN: 33 mg/dL — ABNORMAL HIGH (ref 6–20)
CALCIUM: 9.4 mg/dL (ref 8.9–10.3)
CO2: 25 mmol/L (ref 22–32)
CREATININE: 1 mg/dL (ref 0.44–1.00)
Chloride: 105 mmol/L (ref 101–111)
GFR calc Af Amer: >60 mL/min (ref >60)
GFR, EST NON AFRICAN AMERICAN: 55 mL/min — AB (ref >60)
Glucose, Bld: 151 mg/dL — ABNORMAL HIGH (ref 65–99)
Potassium: 4.2 mmol/L (ref 3.5–5.1)
SODIUM: 138 mmol/L (ref 135–145)

## 2017-01-28 LAB — CBC
HCT: 29.8 % — ABNORMAL LOW (ref 36.0–46.0)
Hemoglobin: 9.3 g/dL — ABNORMAL LOW (ref 12.0–15.0)
MCH: 25.5 pg — AB (ref 26.0–34.0)
MCHC: 31.2 g/dL (ref 30.0–36.0)
MCV: 81.9 fL (ref 78.0–100.0)
PLATELETS: 449 10*3/uL — AB (ref 150–400)
RBC: 3.64 MIL/uL — ABNORMAL LOW (ref 3.87–5.11)
RDW: 14.2 % (ref 11.5–15.5)
WBC: 18.4 10*3/uL — AB (ref 4.0–10.5)

## 2017-01-28 LAB — PHOSPHORUS
PHOSPHORUS: 3.2 mg/dL (ref 2.5–4.6)
Phosphorus: 2.1 mg/dL — ABNORMAL LOW (ref 2.5–4.6)

## 2017-01-28 LAB — LEGIONELLA PNEUMOPHILA SEROGP 1 UR AG: L. pneumophila Serogp 1 Ur Ag: NEGATIVE

## 2017-01-28 LAB — MAGNESIUM
MAGNESIUM: 2.4 mg/dL (ref 1.7–2.4)
MAGNESIUM: 2.2 mg/dL (ref 1.7–2.4)

## 2017-01-28 MED ORDER — ALBUTEROL SULFATE (2.5 MG/3ML) 0.083% IN NEBU
2.5000 mg | INHALATION_SOLUTION | RESPIRATORY_TRACT | Status: DC | PRN
  Administered 2017-01-31 – 2017-02-01 (×3): 2.5 mg via RESPIRATORY_TRACT
  Filled 2017-01-28 (×3): qty 3

## 2017-01-28 MED ORDER — LORAZEPAM 0.5 MG PO TABS
0.2500 mg | ORAL_TABLET | Freq: Two times a day (BID) | ORAL | Status: DC
  Administered 2017-01-28 – 2017-01-29 (×3): 0.25 mg via ORAL
  Filled 2017-01-28 (×3): qty 1

## 2017-01-28 MED ORDER — ROPINIROLE HCL 1 MG PO TABS: 2.0000 mg | ORAL_TABLET | Freq: Every day | ORAL | Status: DC

## 2017-01-28 MED ORDER — GUAIFENESIN-DM 100-10 MG/5ML PO SYRP
5.0000 mL | ORAL_SOLUTION | ORAL | Status: DC | PRN
  Administered 2017-01-28 – 2017-02-02 (×12): 5 mL via ORAL
  Filled 2017-01-28 (×12): qty 5

## 2017-01-28 MED ORDER — IPRATROPIUM-ALBUTEROL 0.5-2.5 (3) MG/3ML IN SOLN
3.0000 mL | Freq: Three times a day (TID) | RESPIRATORY_TRACT | Status: DC
  Administered 2017-01-29: 3 mL via RESPIRATORY_TRACT
  Filled 2017-01-28: qty 3

## 2017-01-28 MED ORDER — METHYLPREDNISOLONE SODIUM SUCC 40 MG IJ SOLR
40.0000 mg | Freq: Two times a day (BID) | INTRAMUSCULAR | Status: DC
  Administered 2017-01-28 – 2017-01-29 (×4): 40 mg via INTRAVENOUS
  Filled 2017-01-28 (×4): qty 1

## 2017-01-28 MED ORDER — TRAMADOL HCL 50 MG PO TABS
50.0000 mg | ORAL_TABLET | Freq: Four times a day (QID) | ORAL | Status: DC | PRN
Start: 2017-01-28 — End: 2017-01-29
  Administered 2017-01-28 – 2017-01-29 (×3): 50 mg via ORAL
  Filled 2017-01-28 (×4): qty 1

## 2017-01-28 NOTE — Progress Notes (Signed)
90 mls of fentanyl wasted in sink with Beckie Salts RN.

## 2017-01-28 NOTE — Procedures (Signed)
Extubation Procedure Note  Patient Details:   Name: Kimberly Downs DOB: July 16, 1946 MRN: [id number]   Airway Documentation:  Airway 7 mm (Active)  Secured at (cm) 23 cm 01/28/2017  7:58 AM  Measured From Lips 01/28/2017  7:58 AM  [place] 01/28/2017  7:58 AM  Secured By [employer] 01/28/2017  7:58 AM  Tube Holder Repositioned Yes 01/28/2017  7:58 AM  Site Condition Dry 01/28/2017  7:58 AM    Evaluation  O2 sats: stable throughout Complications: No apparent complications Patient did tolerate procedure well. Bilateral Breath Sounds: Clear, Diminished   Yes  Patient orient to time and place.  Extubated patient to 3L nasal canula. Patient used Incentive spirometer times 5 post extubation reaching 600.   Dimple Nanas 01/28/2017, 9:41 AM

## 2017-01-28 NOTE — Progress Notes (Signed)
Patient assisted to [facility] with 1 assist. Patient had episode of coughing and then developed a "panic attack". Night time dose of Ativan 0.25mg  PO given, and tylenol 650 mg given for c/o bil chest "muscle pain". Patient request something to help with cough and something else for pain. She states that "tylenol never helps with her pain". Message left with [facility] RN.

## 2017-01-28 NOTE — Progress Notes (Signed)
Respiratory protocol done and patient scored a 9. Patient Doing well No O2 needed and BBS heard throughout with some diminished in the bases. Take treatments at home Prn and with today's scored changed to TID duo neb and added a PRN albuterol.

## 2017-01-28 NOTE — Progress Notes (Addendum)
PULMONARY / CRITICAL CARE MEDICINE   Name: Kimberly Downs MRN: [id number] DOB: 10-07-1945    ADMISSION DATE:  01/26/2017 CONSULTATION DATE:  01/27/2017  REFERRING MD:  Dr. Jeneen Rinks  CHIEF COMPLAINT:  Dyspnea   Brief:   71 y.o. female with a PMH of ASCVD s/p stents, COPD, DM, HTN, HLD, with 50-75 pack per year smoking history.   Presents to ED on 01/26/17 for SOB and chest tightness x 5 days, with productive cough. Placed on BiPAP for mild increase in WOB.  CXR suggested bibasilar infiltrates c/w bronchitis or bronchopneumonia.  While in ED, she developed AF RVR for which she was started on diltiazem and ultimately intubated.   SUBJECTIVE:  No events overnight. Patient remains on low dose fentanyl gtt.   VITAL SIGNS: BP (!) 106/46   Pulse 61   Temp 97.5 F (36.4 C) (Oral)   Resp 18   Ht 5\' 2"  (1.575 m)   Wt 50.5 kg (111 lb 5.3 oz)   SpO2 99%   BMI 20.36 kg/m   HEMODYNAMICS:    VENTILATOR SETTINGS: Vent Mode: PRVC FiO2 (%):  [40 %] 40 % Set Rate:  [18 bmp] 18 bmp Vt Set:  [400 mL] 400 mL PEEP:  [5 cmH20] 5 cmH20 Pressure Support:  [10 cmH20] 10 cmH20 Plateau Pressure:  [11 cmH20-18 cmH20] 18 cmH20  INTAKE / OUTPUT: I/O last 3 completed shifts: In: 3052.9 [I.V.:412.9; NG/GT:490; IV [id number]:0938] Out: 31 [Urine:880; Emesis/NG output:75]  PHYSICAL EXAMINATION: General appearance: female, no acute distress Eyes:  PERRL, EOMI bilaterally. Mouth:  membranes and no mucosal ulcerations Neck: Trachea midline; neck supple, no JVD Lungs/chest: diminished, no wheeze, with no respiratory distress  CV: Irregular, no MRGs  Abdomen: Soft, non-tender; active bowel sounds  Extremities: No peripheral edema or extremity lymphadenopathy Skin: Normal temperature, turgor and texture; no rash, ulcers or subcutaneous nodules Psych: Alert, follows commands, moves all extremities, grossly intact    LABS:  BMET  Recent Labs Lab 01/26/17 1852 01/27/17 1014 01/28/17 0549  NA 132*  136 138  K 3.4* 3.2* 4.2  CL 95* 100* 105  CO2 24 26 25   BUN 13 16 33*  CREATININE 0.94 0.97 1.00  GLUCOSE 143* 151* 151*    Electrolytes  Recent Labs Lab 01/26/17 1852 01/27/17 1014 01/27/17 1910 01/28/17 0549  CALCIUM 9.0 8.7*  --  9.4  MG  --  2.4 2.5* 2.4  PHOS  --  3.5  3.5 2.8 3.2    CBC  Recent Labs Lab 01/26/17 1852 01/27/17 1014 01/28/17 0549  WBC 14.0* 13.2* 18.4*  HGB 9.5* 8.7* 9.3*  HCT 30.4* 28.5* 29.8*  PLT 475* 403* 449*    Coag's  Recent Labs Lab 01/27/17 0304  APTT 36  INR 1.12    Sepsis Markers  Recent Labs Lab 01/27/17 0023 01/27/17 0059 01/27/17 0304 01/27/17 0559  LATICACIDVEN  --  1.00 1.0 1.5  PROCALCITON 0.72  --  0.64  --     ABG  Recent Labs Lab 01/26/17 2231 01/27/17 0247 01/27/17 1015  PHART 7.422 7.236* 7.331*  PCO2ART 39.2 62.6* 49.5*  PO2ART 119.0* 137.0* 158*    Liver Enzymes No results for input(s): AST, ALT, ALKPHOS, BILITOT, ALBUMIN in the last 168 hours.  Cardiac Enzymes  Recent Labs Lab 01/27/17 0023 01/27/17 0559 01/27/17 1014  TROPONINI 0.05* 0.04* 0.04*    Glucose  Recent Labs Lab 01/27/17 0238 01/27/17 0742 01/27/17 1134 01/27/17 1549 01/27/17 1940 01/28/17 0352  GLUCAP 252* 130* 155* 171* 200* 134*  Imaging No results found.  CULTURES: Influenza 5/30 > Negative  Blood 5/30 >> Step Pneumo 5/30 > Negative  Sputum 5/31 >>  ANTIBIOTICS: Azithromycin 5/30 >> Rocephin 5/30 >>   STUDIES:  CXR 5/30 > bibasilar infiltrates. CXR 5/31 > Stable bilateral interstitial changes ECHO 5/31 > EF 42-59, systolic function mildly reduced, G1DD  SIGNIFICANT EVENTS  5/30 > admit.  LINES/TUBES: ETT 5/31 >>  DISCUSSION: 71 year old female presents with respiratory distress secondary to +/CAP, +/-AECOPD  ASSESSMENT / PLAN:  PULMONARY A: Acute on Chronic Hypoxic Respiratory Failure +/-CAP, +/-AECOPD H/O Tobacco Dependence  P:   Vent Support  Wean as tolerated (currently  on 5/5) > will assess for extubation.  VAP bundle  Trend ABG/CXR Continue Ipratropium / Levalbuterol and Solumedrol (60 BID)  CARDIOVASCULAR A: Paroxysmal Atrial fibrillation Afib with RVR Mixed HF- EF 40-45 H/O HTN, HLD, CAD s/p Stents  -Ammo gtt d/c 5/31 P:  Cardiac Monitoring  Cardiology following  Continue Lipitor, ASA, Plavix   RENAL A:   CKD stage 3  P:   Trend BMP Replace electrolytes as needed   GASTROINTESTINAL A:   Protein Calorie Malnutrition  P:   TF PPI  HEMATOLOGIC A:   Anemia of Chronic Diease  P:  Trend CBC  Maintain Hbg >7  INFECTIOUS A:   +/-CAP +/-AECOPD P:   Trend WBC and Fever Curve  Follow Culture Data Continue Rocephin/Azithromycin (Day 3)   ENDOCRINE A:   DM   P:   Trend Glucose  SSI  NEUROLOGIC A:   Acute Encephalopathy in setting of sedation  H/O Anxiety  P:   RASS goal: -1/-2 Wean Fentanyl gtt to achieve RASS PRN versed  Hold home Xanax    FAMILY  - Updates: no family at bedside, patient updated on plan   - Inter-disciplinary family meet or Palliative Care meeting due by:  02/02/2017   CC Time: 18 minutes   Hayden Pedro, AGACNP-BC [facility]  Pgr: [phone]  PCCM Pgr: [phone]  STAFF NOTE: Linwood Dibbles, MD FACP have personally reviewed patient's available data, including medical history, events of note, physical examination and test results as part of my evaluation. I have discussed with resident/NP and other care providers such as pharmacist, RN and RRT. In addition, I personally evaluated patient and elicited key findings of: awake, alert, fc, lungs with reduced BS, pcxr which I reviewed revelaed no defined infiltrate, chornic int changes as well, ett wnl, she has strong cough, lung sounds are improved, no defined PNA, h/o cdiff, dc all abx and observe, wean cpap 5 ps 5 goal 30 min , assess rsbi, steroids reduction, echo  Reviewed and I think there is a component on pcxr  for failure, even balance to slight neg goals, may need lasix, hold tf if weaning well, I update pt in full, flu neg, if extubation may opt for NIMV, may be a good candidate for this The patient is critically ill with multiple organ systems failure and requires high complexity decision making for assessment and support, frequent evaluation and titration of therapies, application of advanced monitoring technologies and extensive interpretation of multiple databases.   Critical Care Time devoted to patient care services described in this note is 30  Minutes. This time reflects time of care of this signee: Merrie Roof, MD FACP. This critical care time does not reflect procedure time, or teaching time or supervisory time of PA/NP/Med student/Med Resident etc but could involve care discussion time. Rest per  NP/medical resident whose note is outlined above and that I agree with   Lavon Paganini. Titus Mould, MD, [place] Pgr: [facility] 01/28/2017 8:09 AM

## 2017-01-28 NOTE — Progress Notes (Signed)
RN called RT to place pt back on full support per MD request.

## 2017-01-28 NOTE — Plan of Care (Signed)
Problem: Nutrition: Goal: Adequate nutrition will be maintained Outcome: Progressing Tube feeding started. Tolerating well.

## 2017-01-29 DIAGNOSIS — J189 Pneumonia, unspecified organism: Secondary | ICD-10-CM

## 2017-01-29 LAB — GLUCOSE, CAPILLARY
GLUCOSE-CAPILLARY: 129 mg/dL — AB (ref 65–99)
Glucose-Capillary: 182 mg/dL — ABNORMAL HIGH (ref 65–99)
Glucose-Capillary: 167 mg/dL — ABNORMAL HIGH (ref 65–99)
Glucose-Capillary: 161 mg/dL — ABNORMAL HIGH (ref 65–99)
Glucose-Capillary: 168 mg/dL — ABNORMAL HIGH (ref 65–99)
Glucose-Capillary: 158 mg/dL — ABNORMAL HIGH (ref 65–99)

## 2017-01-29 LAB — BASIC METABOLIC PANEL
Anion gap: 8 (ref 5–15)
BUN: 33 mg/dL — ABNORMAL HIGH (ref 6–20)
CALCIUM: 9.1 mg/dL (ref 8.9–10.3)
CO2: 26 mmol/L (ref 22–32)
Chloride: 100 mmol/L — ABNORMAL LOW (ref 101–111)
Creatinine, Ser: 1.02 mg/dL — ABNORMAL HIGH (ref 0.44–1.00)
GFR, EST NON AFRICAN AMERICAN: 54 mL/min — AB (ref >60)
GLUCOSE: 186 mg/dL — AB (ref 65–99)
POTASSIUM: 5.1 mmol/L (ref 3.5–5.1)
Sodium: 134 mmol/L — ABNORMAL LOW (ref 135–145)

## 2017-01-29 LAB — CBC
HCT: 29 % — ABNORMAL LOW (ref 36.0–46.0)
Hemoglobin: 9 g/dL — ABNORMAL LOW (ref 12.0–15.0)
MCH: 25.3 pg — ABNORMAL LOW (ref 26.0–34.0)
MCHC: 31 g/dL (ref 30.0–36.0)
MCV: 81.5 fL (ref 78.0–100.0)
Platelets: 443 10*3/uL — ABNORMAL HIGH (ref 150–400)
RBC: 3.56 MIL/uL — ABNORMAL LOW (ref 3.87–5.11)
RDW: 14.4 % (ref 11.5–15.5)
WBC: 26.8 10*3/uL — ABNORMAL HIGH (ref 4.0–10.5)

## 2017-01-29 LAB — CULTURE, RESPIRATORY W GRAM STAIN

## 2017-01-29 LAB — CULTURE, RESPIRATORY: CULTURE: NO GROWTH

## 2017-01-29 MED ORDER — OXYCODONE-ACETAMINOPHEN 5-325 MG PO TABS
1.0000 | ORAL_TABLET | ORAL | Status: DC | PRN
  Administered 2017-01-29 – 2017-02-02 (×17): 1 via ORAL
  Filled 2017-01-29 (×17): qty 1

## 2017-01-29 MED ORDER — BUDESONIDE 0.5 MG/2ML IN SUSP
0.5000 mg | Freq: Two times a day (BID) | RESPIRATORY_TRACT | Status: DC
  Administered 2017-01-29 – 2017-01-31 (×4): 0.5 mg via RESPIRATORY_TRACT
  Filled 2017-01-29 (×5): qty 2

## 2017-01-29 MED ORDER — GUAIFENESIN ER 600 MG PO TB12
1200.0000 mg | ORAL_TABLET | Freq: Two times a day (BID) | ORAL | Status: DC
  Administered 2017-01-29 – 2017-02-02 (×9): 1200 mg via ORAL
  Filled 2017-01-29 (×8): qty 2

## 2017-01-29 MED ORDER — ADULT MULTIVITAMIN W/MINERALS CH: 1.0000 | ORAL_TABLET | Freq: Every day | ORAL | Status: DC

## 2017-01-29 MED ORDER — PANTOPRAZOLE SODIUM 40 MG PO TBEC
40.0000 mg | DELAYED_RELEASE_TABLET | Freq: Every day | ORAL | Status: DC
  Administered 2017-01-29 – 2017-01-31 (×3): 40 mg via ORAL
  Filled 2017-01-29 (×3): qty 1

## 2017-01-29 MED ORDER — CHLORHEXIDINE GLUCONATE 0.12 % MT SOLN
OROMUCOSAL | Status: AC
  Filled 2017-01-29: qty 15

## 2017-01-29 MED ORDER — ALPRAZOLAM 0.5 MG PO TABS
0.5000 mg | ORAL_TABLET | Freq: Two times a day (BID) | ORAL | Status: DC | PRN
  Administered 2017-01-29 – 2017-02-02 (×6): 0.5 mg via ORAL
  Filled 2017-01-29 (×6): qty 1

## 2017-01-29 MED ORDER — ADULT MULTIVITAMIN W/MINERALS CH
1.0000 | ORAL_TABLET | Freq: Every day | ORAL | Status: DC
  Administered 2017-01-30 – 2017-02-02 (×4): 1 via ORAL
  Filled 2017-01-29 (×4): qty 1

## 2017-01-29 MED ORDER — DEXTROMETHORPHAN POLISTIREX ER 30 MG/5ML PO SUER
30.0000 mg | Freq: Two times a day (BID) | ORAL | Status: DC
  Administered 2017-01-29 – 2017-02-02 (×9): 30 mg via ORAL
  Filled 2017-01-29 (×9): qty 5

## 2017-01-29 MED ORDER — ARFORMOTEROL TARTRATE 15 MCG/2ML IN NEBU: 15.0000 ug | INHALATION_SOLUTION | Freq: Two times a day (BID) | RESPIRATORY_TRACT | Status: DC

## 2017-01-29 MED ORDER — VERAPAMIL HCL ER 180 MG PO TBCR
180.0000 mg | EXTENDED_RELEASE_TABLET | Freq: Every day | ORAL | Status: DC
  Administered 2017-01-29 – 2017-02-02 (×5): 180 mg via ORAL
  Filled 2017-01-29 (×5): qty 1

## 2017-01-29 MED ORDER — ARFORMOTEROL TARTRATE 15 MCG/2ML IN NEBU
15.0000 ug | INHALATION_SOLUTION | Freq: Two times a day (BID) | RESPIRATORY_TRACT | Status: DC
  Administered 2017-01-29 – 2017-01-31 (×4): 15 ug via RESPIRATORY_TRACT
  Filled 2017-01-29 (×5): qty 2

## 2017-01-29 MED ORDER — BUDESONIDE 0.5 MG/2ML IN SUSP: 0.5000 mg | Freq: Two times a day (BID) | RESPIRATORY_TRACT | Status: DC

## 2017-01-29 NOTE — Progress Notes (Signed)
Patient arrived on the unit on a wheelchair from [place], placed on tele ccmd notified, assessment completed see flowsheet, patient oriented to room and staff, bed in lowest position, call bell within reach will continue to monitor.

## 2017-01-29 NOTE — Progress Notes (Signed)
PULMONARY / CRITICAL CARE MEDICINE   Name: Kimberly Downs MRN: [id number] DOB: [birth year]    ADMISSION DATE:  01/26/2017 CONSULTATION DATE:  01/27/2017  REFERRING MD:  Dr. Jeneen Rinks  CHIEF COMPLAINT:  Dyspnea   Brief:   71 y.o. female with CAD and COPD was admitted on 5/30 for acute respiratory failure in the setting of a COPD exacerbation and afib.  She required intubation.   SUBJECTIVE:  Extubated on 6/1 Coughing a lot, Chest congestion Chest pain with cough, radiates to back  VITAL SIGNS: BP (!) 130/98   Pulse 84   Temp 98.3 F (36.8 C)   Resp 20   Ht 5\' 2"  (5.176 m)   Wt 50.5 kg (111 lb 5.3 oz)   SpO2 94%   BMI 20.36 kg/m   HEMODYNAMICS:    VENTILATOR SETTINGS:    INTAKE / OUTPUT: I/O last 3 completed shifts: In: 1607 [P.O.:840; I.V.:84; NG/GT:490; IV Piggyback:300] Out: 970 [Urine:970]  PHYSICAL EXAMINATION:  General:  Coughing frequently, in chair HENT: NCAT OP clear PULM: Wheezing bilaterally, normal effort CV: RRR, no mgr GI: BS+, soft, nontender MSK: normal bulk and tone Neuro: awake, alert, oriented x4, MAEW   LABS:  BMET  Recent Labs Lab 01/27/17 1014 01/28/17 0549 01/29/17 0508  NA 136 138 134*  K 3.2* 4.2 5.1  CL 100* 105 100*  CO2 26 25 26   BUN 16 33* 33*  CREATININE 0.97 1.00 1.02*  GLUCOSE 151* 151* 186*    Electrolytes  Recent Labs Lab 01/27/17 1014 01/27/17 1910 01/28/17 0549 01/28/17 1805 01/29/17 0508  CALCIUM 8.7*  --  9.4  --  9.1  MG 2.4 2.5* 2.4 2.2  --   PHOS 3.5  3.5 2.8 3.2 2.1*  --     CBC  Recent Labs Lab 01/27/17 1014 01/28/17 0549 01/29/17 0508  WBC 13.2* 18.4* 26.8*  HGB 8.7* 9.3* 9.0*  HCT 28.5* 29.8* 29.0*  PLT 403* 449* 443*    Coag's  Recent Labs Lab 01/27/17 0304  APTT 36  INR 1.12    Sepsis Markers  Recent Labs Lab 01/27/17 0023 01/27/17 0059 01/27/17 0304 01/27/17 0559  LATICACIDVEN  --  1.00 1.0 1.5  PROCALCITON 0.72  --  0.64  --     ABG  Recent Labs Lab  01/26/17 2231 01/27/17 0247 01/27/17 1015  PHART 7.422 7.236* 7.331*  PCO2ART 39.2 62.6* 49.5*  PO2ART 119.0* 137.0* 158*    Liver Enzymes No results for input(s): AST, ALT, ALKPHOS, BILITOT, ALBUMIN in the last 168 hours.  Cardiac Enzymes  Recent Labs Lab 01/27/17 0023 01/27/17 0559 01/27/17 1014  TROPONINI 0.05* 0.04* 0.04*    Glucose  Recent Labs Lab 01/28/17 2005 01/28/17 2046 01/28/17 2324 01/29/17 0320 01/29/17 0757 01/29/17 1138  GLUCAP 64* 107* 197* 182* 167* 161*    Imaging No results found.  CULTURES: Influenza 5/30 > Negative  Blood 5/30 >> Step Pneumo 5/30 > Negative  Sputum 5/31 >> negative  ANTIBIOTICS: Azithromycin 5/30 >> Rocephin 5/30 >>   STUDIES:  CXR 5/30 > bibasilar infiltrates CXR 5/31 > Stable bilateral interstitial changes ECHO 5/31 > EF 37-10, systolic function mildly reduced, G1DD  SIGNIFICANT EVENTS  5/30 > admit  LINES/TUBES: ETT 5/31 >> 6/1   DISCUSSION: 71 y/o female admitted for acute respiratory failure with hypoxemia requiring intubation.  As of 6/1 she was extubated.  ASSESSMENT / PLAN:  PULMONARY A: Acute on chronic respiratory failure with hypoxemia AE COPD Cough, chest congestion P:   Solumedrol to  continue Add brovana Add pulmicort Prn albuterol Add guaifenesin scheduled Flutter valve May need tussionex, holding as giving oxycodone for pain  CARDIOVASCULAR A:  Afib with RVR > resolved Mixed HF- EF 40-45 H/O HTN, HLD, CAD s/p Stents  P:  Cardiac Monitoring  Add back home verapamil Continue Lipitor, ASA, Plavix   RENAL A:   CKD stage 3  P:   Monitor BMET and UOP Replace electrolytes as needed   GASTROINTESTINAL A:   Protein Calorie Malnutrition  P:   Advance diet  HEMATOLOGIC A:   Anemia of Chronic Diease  Leukocytosis> likely steroid effect as no fever P:  Trend CBC  Maintain Hbg >7  INFECTIOUS A:   CAP AECOPD P:   Continue ceftriaxone and azithromycin Monitor for  fever  ENDOCRINE A:   DM   P:   Trend Glucose  SSI  NEUROLOGIC A:   Acute Encephalopathy > resolved H/O Anxiety  P:   Add back home xanax  Stop ativan  FAMILY  - Updates: no family at bedside, patient updated on plan   - Inter-disciplinary family meet or Palliative Care meeting due by:  02/02/2017  Roselie Awkward, MD [place] [facility] Pager: [phone] Cell: [phone] After 3pm or if no response, call [phone]   01/29/2017 11:51 AM

## 2017-01-30 DIAGNOSIS — E1121 Type 2 diabetes mellitus with diabetic nephropathy: Secondary | ICD-10-CM

## 2017-01-30 DIAGNOSIS — I10 Essential (primary) hypertension: Secondary | ICD-10-CM

## 2017-01-30 LAB — GLUCOSE, CAPILLARY
GLUCOSE-CAPILLARY: 152 mg/dL — AB (ref 65–99)
GLUCOSE-CAPILLARY: 159 mg/dL — AB (ref 65–99)
Glucose-Capillary: 172 mg/dL — ABNORMAL HIGH (ref 65–99)
Glucose-Capillary: 152 mg/dL — ABNORMAL HIGH (ref 65–99)
Glucose-Capillary: 209 mg/dL — ABNORMAL HIGH (ref 65–99)

## 2017-01-30 LAB — BASIC METABOLIC PANEL
Anion gap: 7 (ref 5–15)
BUN: 26 mg/dL — ABNORMAL HIGH (ref 6–20)
CHLORIDE: 101 mmol/L (ref 101–111)
CO2: 27 mmol/L (ref 22–32)
CREATININE: 0.98 mg/dL (ref 0.44–1.00)
Calcium: 8.7 mg/dL — ABNORMAL LOW (ref 8.9–10.3)
GFR calc non Af Amer: 56 mL/min — ABNORMAL LOW (ref >60)
GLUCOSE: 159 mg/dL — AB (ref 65–99)
Potassium: 4.6 mmol/L (ref 3.5–5.1)
Sodium: 135 mmol/L (ref 135–145)

## 2017-01-30 MED ORDER — INSULIN ASPART 100 UNIT/ML ~~LOC~~ SOLN
0.0000 [IU] | Freq: Three times a day (TID) | SUBCUTANEOUS | Status: DC
  Administered 2017-01-31: 2 [IU] via SUBCUTANEOUS
  Administered 2017-01-31: 1 [IU] via SUBCUTANEOUS
  Administered 2017-02-01: 2 [IU] via SUBCUTANEOUS
  Administered 2017-02-01 – 2017-02-02 (×2): 5 [IU] via SUBCUTANEOUS
  Administered 2017-02-02: 1 [IU] via SUBCUTANEOUS

## 2017-01-30 MED ORDER — AZITHROMYCIN 500 MG PO TABS
500.0000 mg | ORAL_TABLET | Freq: Once | ORAL | Status: AC
  Administered 2017-01-30: 500 mg via ORAL
  Filled 2017-01-30: qty 1

## 2017-01-30 MED ORDER — DEXTROSE 5 % IV SOLN
1.0000 g | INTRAVENOUS | Status: DC
  Administered 2017-01-30 – 2017-02-01 (×3): 1 g via INTRAVENOUS
  Filled 2017-01-30 (×4): qty 10

## 2017-01-30 MED ORDER — METHYLPREDNISOLONE SODIUM SUCC 40 MG IJ SOLR
40.0000 mg | Freq: Every day | INTRAMUSCULAR | Status: DC
  Administered 2017-01-30 – 2017-01-31 (×2): 40 mg via INTRAVENOUS
  Filled 2017-01-30 (×2): qty 1

## 2017-01-30 MED ORDER — AZITHROMYCIN 250 MG PO TABS
250.0000 mg | ORAL_TABLET | Freq: Every day | ORAL | Status: DC
  Administered 2017-01-31: 250 mg via ORAL
  Filled 2017-01-30: qty 1

## 2017-01-30 MED ORDER — ENOXAPARIN SODIUM 40 MG/0.4ML ~~LOC~~ SOLN
40.0000 mg | Freq: Two times a day (BID) | SUBCUTANEOUS | Status: DC
  Administered 2017-01-30 – 2017-01-31 (×2): 40 mg via SUBCUTANEOUS
  Filled 2017-01-30 (×2): qty 0.4

## 2017-01-30 NOTE — Evaluation (Addendum)
Physical Therapy Evaluation Patient Details Name: Kimberly Downs MRN: [id number] DOB: 1945-09-08 Today's Date: 01/30/2017   History of Present Illness  Kimberly Downs is a 71 y.o. female with medical history significant of ASCVD s/p stent placement and abdominal aorta angiogram with 50% stenosis per patient report with h/o DM, HTN, HLD, and COPD not on home O2 presenting with SOB.  Patient admitted for bronchitis and bronchopneumonia. Developed A. fib RVR on the emergency department. Required Cardizem drip and was subsequently intubated. She was extubated on 01/28/2017.     Clinical Impression  Patient presents with decreased strength and balance, impacting mobility/gait/safety.  Will benefit from acute PT to maximize functional independence prior to return home with family.  Recommend f/u HHPT for continued therapy at d/c.  Also recommend patient use her rollator for gait at home for safety.    Follow Up Recommendations Home health PT;Supervision - Intermittent    Equipment Recommendations  3in1 (PT)    Recommendations for Other Services       Precautions / Restrictions Precautions Precautions: Fall Restrictions Weight Bearing Restrictions: No      Mobility  Bed Mobility Overal bed mobility: Modified Independent             General bed mobility comments: Increased time.  Patient moves slowly, and sits for several seconds prior to standing due to "dizziness"  Transfers Overall transfer level: Needs assistance Equipment used: 1 person hand held assist Transfers: Sit to/from Stand Sit to Stand: Min assist         General transfer comment: Assist to rise to standing and for balance.  Slightly unsteady in stance.  Reports minimal dizziness in stance.  Ambulation/Gait Ambulation/Gait assistance: Min assist Ambulation Distance (Feet): 40 Feet Assistive device: None Gait Pattern/deviations: Step-through pattern;Decreased step length - right;Decreased step length -  left;Decreased stride length;Shuffle;Staggering left;Staggering right;Trunk flexed Gait velocity: decreased Gait velocity interpretation: Below normal speed for age/gender General Gait Details: Patient with slow, unsteady gait, reaching for sink/wall for support for balance.   Distance limited by patient, had just ambulated with nursing in hallway.  Stairs            Wheelchair Mobility    Modified Rankin (Stroke Patients Only)       Balance Overall balance assessment: Needs assistance Sitting-balance support: No upper extremity supported;Feet supported Sitting balance-Leahy Scale: Good     Standing balance support: Single extremity supported Standing balance-Leahy Scale: Poor                               Pertinent Vitals/Pain Pain Assessment: 0-10 Pain Score: 5  Pain Location: chest wall (from coughing) Pain Descriptors / Indicators: Sore Pain Intervention(s): Monitored during session    Home Living Family/patient expects to be discharged to:: Private residence Living Arrangements: Children;Other relatives (Son, d-i-l, 2 grandchildren) Available Help at Discharge: Family;Available PRN/intermittently (Son works Designer, fashion/clothing, daughter-in-law works days) Type of Home: [employer] Access: Stairs to enter Entrance Stairs-Rails: Psychiatric nurse of Steps: [employer]: Multi-level;Able to live on main level with bedroom/bathroom Home Equipment: Walker - 4 wheels      Prior Function Level of Independence: Independent         Comments: Drives, Very active. Swims     Hand Dominance        Extremity/Trunk Assessment   Upper Extremity Assessment Upper Extremity Assessment: Overall WFL for tasks assessed    Lower Extremity Assessment Lower Extremity Assessment: Generalized  weakness       Communication   Communication: No difficulties  Cognition Arousal/Alertness: Awake/alert Behavior During Therapy: WFL for tasks  assessed/performed (Very talkative) Overall Cognitive Status: Within Functional Limits for tasks assessed                                        General Comments      Exercises     Assessment/Plan    PT Assessment Patient needs continued PT services  PT Problem List Decreased strength;Decreased activity tolerance;Decreased balance;Decreased mobility;Decreased knowledge of use of DME;Cardiopulmonary status limiting activity;Pain       PT Treatment Interventions DME instruction;Gait training;Stair training;Functional mobility training;Therapeutic activities;Balance training;Patient/family education    PT Goals (Current goals can be found in the Care Plan section)  Acute Rehab PT Goals Patient Stated Goal: To get well PT Goal Formulation: With patient Time For Goal Achievement: 02/06/17 Potential to Achieve Goals: Good    Frequency Min 3X/week   Barriers to discharge Decreased caregiver support Family available prn - son and daughter-in-law work.    Co-evaluation               AM-PAC PT "6 Clicks" Daily Activity  Outcome Measure Difficulty turning over in bed (including adjusting bedclothes, sheets and blankets)?: None Difficulty moving from lying on back to sitting on the side of the bed? : None Difficulty sitting down on and standing up from a chair with arms (e.g., wheelchair, bedside commode, etc,.)?: Total Help needed moving to and from a bed to chair (including a wheelchair)?: A Little Help needed walking in hospital room?: A Little Help needed climbing 3-5 steps with a railing? : A Little 6 Click Score: 18    End of Session Equipment Utilized During Treatment: Gait belt Activity Tolerance: Patient limited by fatigue Patient left: in bed;with call bell/phone within reach;with bed alarm set Nurse Communication: Mobility status PT Visit Diagnosis: Unsteadiness on feet (R26.81);Other abnormalities of gait and mobility (R26.89);Muscle weakness  (generalized) (M62.81);Pain;Dizziness and giddiness (R42) Pain - part of body:  (chest wall)    Time: 7026-3785 PT Time Calculation (min) (ACUTE ONLY): 25 min   Charges:   PT Evaluation $PT Eval Moderate Complexity: 1 Procedure PT Treatments $Gait Training: 8-22 mins   PT G Codes:        Carita Pian. Sanjuana Kava, [facility] Acute Rehab Services Pager [place] 01/30/2017, 5:17 PM

## 2017-01-30 NOTE — Progress Notes (Signed)
PROGRESS NOTE    Kimberly Downs  QZE:[id number] DOB: 1946/07/30 DOA: 01/26/2017 PCP: Corine Shelter, PA-C   No chief complaint on file.   Brief Narrative:  HPI On 01/26/2017 by Dr. Karmen Bongo Kimberly Downs is a 71 y.o. female with medical history significant of ASCVD s/p stent placement and abdominal aorta angiogram with 50% stenosis per patient report with h/o DM, HTN, HLD, and COPD not on home O2 presenting with SOB.  She reports that since Saturday, she has been very sick to her stomach, coughing up so much.  +SOB.  Sputum productive of light green to dark green sputum.  +chills, no fevers.  No sick contacts.  Unable to provide additional history due to marked SOB/tachypnea while on BIPAP.  Interim history Patient admitted for bronchitis and bronchopneumonia. Developed A. fib RVR on the emergency department. Required Cardizem drip and was subsequently intubated. She was extubated on 01/28/2017. [facility] assumed care on 01/30/2017. Assessment & Plan   Acute on chronic respiratory failure with hypoxemia/COPD exacerbation -Patient did require intubation however was successfully extubated on 01/28/2017 -Pulmonology following and appreciated -Currently requiring supplemental oxygen -Continue azithromycin, ceftriaxone -Wean steroids -Continue Brovana, Pulmicort, Mucinex, Delsym  Transient atrial fibrillation with RVR/mildly elevated troponin -Likely secondary to respiratory status -Patient did require Cardizem drip however weaned off successfully and currently in sinus rhythm -Was placed on Lovenox full dose -CHADSVASC 3 (given gender, age, HTN) -Will discuss continued anticoagulation with cardiology -Suspect mild elevation in troponin, 0.05, 0.04 likely due to imminent ischemia from atrial fibrillation -No complaints of chest pain -Continue aspirin, Plavix, statin  Anemia of chronic disease -Baseline hemoglobin proximally 9-10, currently 9 -Continue to monitor  CBC  Leukocytosis -Likely secondary to steroids -Currently afebrile -Continue to monitor CBC  Acute encephalopathy -Likely metabolic, has resolved  Anxiety -Continue Xanax  Essential hypertension -Continue verapamil  Chronic kidney disease, stage III -Creatinine appears to be stable, continue to monitor BMP  Chronic systolic/diastolic heart failure -Patient appears to be euvolemic -Echocardiogram showed EF of 22-63%, grade 1 diastolic dysfunction -Continue monitor intake and output, daily weights  Deconditioning -Will consult PT  DVT Prophylaxis  lovenox  Code Status: Full  Family Communication: None at bedside  Disposition Plan: Admitted  Consultants PCCM  Procedures  Intubation/extubation Echocardiogram  Antibiotics   Anti-infectives    Start     Dose/Rate Route Frequency Ordered Stop   01/31/17 1000  azithromycin (ZITHROMAX) tablet 250 mg     250 mg Oral Daily 01/30/17 1159 02/04/17 0959   01/30/17 1300  cefTRIAXone (ROCEPHIN) 1 g in dextrose 5 % 50 mL IVPB     1 g 100 mL/hr over 30 Minutes Intravenous Every 24 hours 01/30/17 1159 02/04/17 1259   01/30/17 1200  azithromycin (ZITHROMAX) tablet 500 mg     500 mg Oral  Once 01/30/17 1159     01/27/17 2200  cefTRIAXone (ROCEPHIN) 1 g in dextrose 5 % 50 mL IVPB  Status:  Discontinued     1 g 100 mL/hr over 30 Minutes Intravenous Every 24 hours 01/27/17 0241 01/28/17 0810   01/27/17 2000  azithromycin (ZITHROMAX) 500 mg in dextrose 5 % 250 mL IVPB  Status:  Discontinued     500 mg 250 mL/hr over 60 Minutes Intravenous Every 24 hours 01/27/17 0241 01/28/17 0810   01/26/17 2030  cefTRIAXone (ROCEPHIN) 1 g in dextrose 5 % 50 mL IVPB     1 g 100 mL/hr over 30 Minutes Intravenous  Once 01/26/17 2016 01/26/17 2152  01/26/17 2030  azithromycin (ZITHROMAX) 500 mg in dextrose 5 % 250 mL IVPB     500 mg 250 mL/hr over 60 Minutes Intravenous  Once 01/26/17 2016 01/26/17 2220      Subjective:   Kimberly Downs  seen and examined today.  Patient states she feels weak and continues to have dry cough. Is feeling better compared to prior days. Denies any chest pain, abdominal pain, nausea or vomiting, diarrhea, constipation.    Objective:   Vitals:   01/30/17 0456 01/30/17 0930 01/30/17 0946 01/30/17 1100  BP: (!) 143/50  (!) 151/63   Pulse: 62  75   Resp: 20  (!) 21   Temp: 97.7 F (36.5 C)  98.9 F (37.2 C)   TempSrc: Oral  Oral   SpO2: 98% 96% 96%   Weight: 5.352 kg (11 lb 12.8 oz)   49.8 kg (109 lb 12.8 oz)  Height:        Intake/Output Summary (Last 24 hours) at 01/30/17 1212 Last data filed at 01/30/17 0459  Gross per 24 hour  Intake              240 ml  Output              340 ml  Net             -100 ml   Filed Weights   01/29/17 0500 01/30/17 0456 01/30/17 1100  Weight: 50.5 kg (111 lb 5.3 oz) 5.352 kg (11 lb 12.8 oz) 49.8 kg (109 lb 12.8 oz)    Exam  General: Well developed, elderly, ill appearing, NAD  HEENT: NCAT, mucous membranes moist.   Cardiovascular: S1 S2 auscultated, no rubs, murmurs or gallops. Regular rate and rhythm.  Respiratory: Diminished breath sounds, mild wheezing, +dry cough  Abdomen: Soft, nontender, nondistended, + bowel sounds  Extremities: warm dry without cyanosis clubbing or edema  Neuro: AAOx3, nonfocal  Psych: Anxious however appropriate   Data Reviewed: I have personally reviewed following labs and imaging studies  CBC:  Recent Labs Lab 01/26/17 1852 01/27/17 1014 01/28/17 0549 01/29/17 0508  WBC 14.0* 13.2* 18.4* 26.8*  HGB 9.5* 8.7* 9.3* 9.0*  HCT 30.4* 28.5* 29.8* 29.0*  MCV 80.0 81.0 81.9 81.5  PLT 475* 403* 449* 478*   Basic Metabolic Panel:  Recent Labs Lab 01/26/17 1852 01/27/17 1014 01/27/17 1910 01/28/17 0549 01/28/17 1805 01/29/17 0508 01/30/17 0152  NA 132* 136  --  138  --  134* 135  K 3.4* 3.2*  --  4.2  --  5.1 4.6  CL 95* 100*  --  105  --  100* 101  CO2 24 26  --  25  --  26 27  GLUCOSE 143*  151*  --  151*  --  186* 159*  BUN 13 16  --  33*  --  33* 26*  CREATININE 0.94 0.97  --  1.00  --  1.02* 0.98  CALCIUM 9.0 8.7*  --  9.4  --  9.1 8.7*  MG  --  2.4 2.5* 2.4 2.2  --   --   PHOS  --  3.5  3.5 2.8 3.2 2.1*  --   --    GFR: Estimated Creatinine Clearance: 40.8 mL/min (by C-G formula based on SCr of 0.98 mg/dL). Liver Function Tests: No results for input(s): AST, ALT, ALKPHOS, BILITOT, PROT, ALBUMIN in the last 168 hours. No results for input(s): LIPASE, AMYLASE in the last 168 hours. No results for input(s):  AMMONIA in the last 168 hours. Coagulation Profile:  Recent Labs Lab 01/27/17 0304  INR 1.12   Cardiac Enzymes:  Recent Labs Lab 01/27/17 0023 01/27/17 0559 01/27/17 1014  TROPONINI 0.05* 0.04* 0.04*   BNP (last 3 results) No results for input(s): PROBNP in the last 8760 hours. HbA1C: No results for input(s): HGBA1C in the last 72 hours. CBG:  Recent Labs Lab 01/29/17 2003 01/29/17 2349 01/30/17 0438 01/30/17 0806 01/30/17 1127  GLUCAP 158* 129* 172* 152* 152*   Lipid Profile: No results for input(s): CHOL, HDL, LDLCALC, TRIG, CHOLHDL, LDLDIRECT in the last 72 hours. Thyroid Function Tests: No results for input(s): TSH, T4TOTAL, FREET4, T3FREE, THYROIDAB in the last 72 hours. Anemia Panel: No results for input(s): VITAMINB12, FOLATE, FERRITIN, TIBC, IRON, RETICCTPCT in the last 72 hours. Urine analysis:    Component Value Date/Time   COLORURINE YELLOW 02/03/2016 1309   APPEARANCEUR CLEAR 02/03/2016 1309   LABSPEC 1.011 02/03/2016 1309   PHURINE 5.5 02/03/2016 1309   GLUCOSEU NEGATIVE 02/03/2016 1309   HGBUR LARGE (A) 02/03/2016 1309   BILIRUBINUR NEGATIVE 02/03/2016 1309   KETONESUR NEGATIVE 02/03/2016 1309   PROTEINUR NEGATIVE 02/03/2016 1309   UROBILINOGEN 0.2 05/25/2015 1400   NITRITE NEGATIVE 02/03/2016 1309   LEUKOCYTESUR NEGATIVE 02/03/2016 1309   Sepsis Labs: @LABRCNTIP (procalcitonin:4,lacticidven:4)  ) Recent Results  (from the past 240 hour(s))  Blood culture (routine x 2)     Status: None (Preliminary result)   Collection Time: 01/26/17  8:58 PM  Result Value Ref Range Status   Specimen Description BLOOD RIGHT ARM  Final   Special Requests   Final    BOTTLES DRAWN AEROBIC AND ANAEROBIC Blood Culture adequate volume   Culture NO GROWTH 3 DAYS  Final   Report Status PENDING  Incomplete  Blood culture (routine x 2)     Status: None (Preliminary result)   Collection Time: 01/26/17  9:07 PM  Result Value Ref Range Status   Specimen Description BLOOD RIGHT ARM  Final   Special Requests   Final    BOTTLES DRAWN AEROBIC AND ANAEROBIC Blood Culture adequate volume   Culture NO GROWTH 3 DAYS  Final   Report Status PENDING  Incomplete  MRSA PCR Screening     Status: None   Collection Time: 01/27/17  3:25 AM  Result Value Ref Range Status   MRSA by PCR NEGATIVE NEGATIVE Final    Comment:        The GeneXpert MRSA Assay (FDA approved for NASAL specimens only), is one component of a comprehensive MRSA colonization surveillance program. It is not intended to diagnose MRSA infection nor to guide or monitor treatment for MRSA infections.   Culture, respiratory (NON-Expectorated)     Status: None   Collection Time: 01/27/17  4:32 AM  Result Value Ref Range Status   Specimen Description TRACHEAL ASPIRATE  Final   Special Requests NONE  Final   Gram Stain   Final    ABUNDANT WBC PRESENT, PREDOMINANTLY PMN RARE GRAM POSITIVE COCCI    Culture NO GROWTH 2 DAYS  Final   Report Status 01/29/2017 FINAL  Final      Radiology Studies: No results found.   Scheduled Meds: . arformoterol  15 mcg Nebulization BID  . aspirin  81 mg Oral Daily  . atorvastatin  10 mg Oral QHS  . [START ON 01/31/2017] azithromycin  250 mg Oral Daily  . azithromycin  500 mg Oral Once  . budesonide (PULMICORT) nebulizer solution  0.5 mg  Nebulization BID  . chlorhexidine  15 mL Mouth Rinse BID  . clopidogrel  75 mg Oral Daily   . dextromethorphan  30 mg Oral BID  . enoxaparin (LOVENOX) injection  40 mg Subcutaneous Q12H  . guaiFENesin  1,200 mg Oral BID  . insulin aspart  0-9 Units Subcutaneous Q4H  . isosorbide mononitrate  30 mg Oral Daily  . mouth rinse  15 mL Mouth Rinse q12n4p  . methylPREDNISolone (SOLU-MEDROL) injection  40 mg Intravenous Daily  . multivitamin with minerals  1 tablet Oral Daily  . pantoprazole  40 mg Oral Daily  . pentoxifylline  400 mg Oral Daily  . rOPINIRole  2 mg Oral QHS  . verapamil  180 mg Oral Daily   Continuous Infusions: . cefTRIAXone (ROCEPHIN)  IV       LOS: 3 days   Time Spent in minutes   30 minutes  Kimberly Downs D.O. on 01/30/2017 at 12:12 PM  Between 7am to 7pm - Pager - [phone]  After 7pm go to [email or web address] - password [email or web address]  And look for the night coverage person covering for me after hours  [facility] Office  [phone]

## 2017-01-30 NOTE — Progress Notes (Addendum)
Patient ambulated in the hall on room air using a rolling walker, maintained an O2 sat greater than 92% before and during ambulation, ambulation well tolerated, patient now resting quietly in bed , call bell within reach, bed in lowest position, will continue to monitor.

## 2017-01-30 NOTE — Progress Notes (Signed)
Patient walked 361ft on room air w/ front wheel walker. Pt tolerated ambulation well. Will monitor.
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
## 2017-01-31 ENCOUNTER — Encounter (HOSPITAL_COMMUNITY): Payer: Self-pay | Admitting: Anesthesiology

## 2017-01-31 DIAGNOSIS — D649 Anemia, unspecified: Secondary | ICD-10-CM

## 2017-01-31 DIAGNOSIS — K922 Gastrointestinal hemorrhage, unspecified: Secondary | ICD-10-CM

## 2017-01-31 LAB — C DIFFICILE QUICK SCREEN W PCR REFLEX
C Diff antigen: POSITIVE — AB
C Diff toxin: NEGATIVE

## 2017-01-31 LAB — CBC
HCT: 26.5 % — ABNORMAL LOW (ref 36.0–46.0)
Hemoglobin: 8.4 g/dL — ABNORMAL LOW (ref 12.0–15.0)
MCH: 25.8 pg — AB (ref 26.0–34.0)
MCHC: 31.7 g/dL (ref 30.0–36.0)
MCV: 81.3 fL (ref 78.0–100.0)
PLATELETS: 450 10*3/uL — AB (ref 150–400)
RBC: 3.26 MIL/uL — ABNORMAL LOW (ref 3.87–5.11)
RDW: 14.6 % (ref 11.5–15.5)
WBC: 19.2 10*3/uL — ABNORMAL HIGH (ref 4.0–10.5)

## 2017-01-31 LAB — RETICULOCYTES
RBC.: 3.28 MIL/uL — AB (ref 3.87–5.11)
RETIC COUNT ABSOLUTE: 39.4 10*3/uL (ref 19.0–186.0)
RETIC CT PCT: 1.2 % (ref 0.4–3.1)

## 2017-01-31 LAB — FERRITIN: FERRITIN: 108 ng/mL (ref 11–307)

## 2017-01-31 LAB — CULTURE, BLOOD (ROUTINE X 2)
CULTURE: NO GROWTH
Culture: NO GROWTH
SPECIAL REQUESTS: ADEQUATE
Special Requests: ADEQUATE

## 2017-01-31 LAB — IRON AND TIBC
Iron: 40 ug/dL (ref 28–170)
Saturation Ratios: 12 % (ref 10.4–31.8)
TIBC: 340 ug/dL (ref 250–450)
UIBC: 300 ug/dL

## 2017-01-31 LAB — FOLATE: FOLATE: 13.2 ng/mL (ref >5.9)

## 2017-01-31 LAB — GLUCOSE, CAPILLARY
GLUCOSE-CAPILLARY: 141 mg/dL — AB (ref 65–99)
Glucose-Capillary: 112 mg/dL — ABNORMAL HIGH (ref 65–99)
Glucose-Capillary: 200 mg/dL — ABNORMAL HIGH (ref 65–99)
Glucose-Capillary: 205 mg/dL — ABNORMAL HIGH (ref 65–99)

## 2017-01-31 LAB — OCCULT BLOOD X 1 CARD TO LAB, STOOL: Fecal Occult Bld: POSITIVE — AB

## 2017-01-31 LAB — CLOSTRIDIUM DIFFICILE BY PCR: CDIFFPCR: NEGATIVE

## 2017-01-31 LAB — VITAMIN B12: Vitamin B-12: 1399 pg/mL — ABNORMAL HIGH (ref 180–914)

## 2017-01-31 MED ORDER — PANTOPRAZOLE SODIUM 40 MG PO TBEC
40.0000 mg | DELAYED_RELEASE_TABLET | Freq: Two times a day (BID) | ORAL | Status: DC
  Administered 2017-01-31: 40 mg via ORAL
  Filled 2017-01-31: qty 1

## 2017-01-31 MED ORDER — BENZONATATE 100 MG PO CAPS
200.0000 mg | ORAL_CAPSULE | Freq: Three times a day (TID) | ORAL | Status: DC
  Administered 2017-01-31 – 2017-02-02 (×7): 200 mg via ORAL
  Filled 2017-01-31 (×7): qty 2

## 2017-01-31 MED ORDER — PREDNISONE 20 MG PO TABS
40.0000 mg | ORAL_TABLET | Freq: Every day | ORAL | Status: DC
  Administered 2017-02-01 – 2017-02-02 (×2): 40 mg via ORAL
  Filled 2017-01-31 (×2): qty 2

## 2017-01-31 MED ORDER — TIOTROPIUM BROMIDE MONOHYDRATE 18 MCG IN CAPS
18.0000 ug | ORAL_CAPSULE | Freq: Every day | RESPIRATORY_TRACT | Status: DC
  Administered 2017-02-01 – 2017-02-02 (×2): 18 ug via RESPIRATORY_TRACT
  Filled 2017-01-31: qty 5

## 2017-01-31 MED ORDER — MOMETASONE FURO-FORMOTEROL FUM 200-5 MCG/ACT IN AERO
2.0000 | INHALATION_SPRAY | Freq: Two times a day (BID) | RESPIRATORY_TRACT | Status: DC
  Administered 2017-01-31 – 2017-02-02 (×4): 2 via RESPIRATORY_TRACT
  Filled 2017-01-31: qty 8.8

## 2017-01-31 MED ORDER — SODIUM CHLORIDE 0.9 % IV SOLN
INTRAVENOUS | Status: DC
  Administered 2017-01-31: 22:00:00 via INTRAVENOUS

## 2017-01-31 NOTE — Progress Notes (Signed)
PROGRESS NOTE    Kimberly Downs  LPF:[id number] DOB: 18-Mar-1946 DOA: 01/26/2017 PCP: Corine Shelter, PA-C   No chief complaint on file.   Brief Narrative:  HPI On 01/26/2017 by Dr. Karmen Bongo Kimberly Downs is a 71 y.o. female with medical history significant of ASCVD s/p stent placement and abdominal aorta angiogram with 50% stenosis per patient report with h/o DM, HTN, HLD, and COPD not on home O2 presenting with SOB.  She reports that since Saturday, she has been very sick to her stomach, coughing up so much.  +SOB.  Sputum productive of light green to dark green sputum.  +chills, no fevers.  No sick contacts.  Unable to provide additional history due to marked SOB/tachypnea while on BIPAP.  Interim history Patient admitted for bronchitis and bronchopneumonia. Developed A. fib RVR on the emergency department. Required Cardizem drip and was subsequently intubated. She was extubated on 01/28/2017. [facility] assumed care on 01/30/2017. Patient now having dark melanotic stools. GI consulted.   Assessment & Plan   Acute on chronic respiratory failure with hypoxemia/COPD exacerbation -Patient did require intubation however was successfully extubated on 01/28/2017 -Pulmonology following and appreciated -Currently requiring supplemental oxygen -Initially placed on azithromycin and ceftriaxone. Will discontinue azithromycin as this could exacerbate patient's diarrhea.  -Continue to wean steroids, will transition to PO prednisone -Continue Brovana, Pulmicort, Mucinex, Delsym -Patient was able to ambulate with rolling walker, and maintained O2 sats >92% on room air.  -Upon discharge patient would likely benefit from home nebulizer -Added scheduled tessalon to aid with cough  Normocytic Anemia / Possible GI bleed/ Melena/ Symptomatic Anemia -Baseline hemoglobin between 9-11 (reviewing records in EPIC) -Patient was placed on full dose lovenox for transient Afib on admission, however this was  discontinued. She was also on plavix and aspirin, which have both been held as well. -hemoglobin currently 8.4 -FOBT positive -obtained Anemia panel WNL, B12: 1399 -Gastroenterology consulted and appreciated -Last colonoscopy Sept 2016 showed left sided diverticula, end-to-side anastamosis at 10cm above dentate line. Biposy results were unremarkable. (colonscopy done at that time for rectal bleeding and diarrhea) -Will continue to monitor CBC  Transient atrial fibrillation with RVR/mildly elevated troponin/history of CAD -Likely secondary to respiratory status -Patient did require Cardizem drip however weaned off successfully and currently in sinus rhythm -Was placed on Lovenox full dose -CHADSVASC 3 (given gender, age, HTN) -Will discuss continued anticoagulation with cardiology -Suspect mild elevation in troponin, 0.05, 0.04 likely due to imminent ischemia from atrial fibrillation -No complaints of chest pain -Given possible GI bleed, will hold aspirin and plavix  Leukocytosis -Likely secondary to steroids -WBC trending downward, currently 19.2 -Currently afebrile -Continue to monitor CBC  Acute encephalopathy -Likely metabolic, has resolved  Anxiety -Continue Xanax  Essential hypertension -Continue verapamil  Chronic kidney disease, stage III -Creatinine appears to be stable, continue to monitor BMP  Chronic systolic/diastolic heart failure -Patient appears to be euvolemic -Echocardiogram showed EF of 53-29%, grade 1 diastolic dysfunction -Continue monitor intake and output, daily weights  Deconditioning -PT consulted and recommended home health PT and 3in1 -likely secondary to COPD exac, made worse by continued cough  DVT Prophylaxis  lovenox  Code Status: Full  Family Communication: None at bedside  Disposition Plan: Admitted  Consultants PCCM Gastroenterology  Procedures  Intubation/extubation Echocardiogram  Antibiotics   Anti-infectives    Start      Dose/Rate Route Frequency Ordered Stop   01/31/17 1000  azithromycin (ZITHROMAX) tablet 250 mg  Status:  Discontinued     250  mg Oral Daily 01/30/17 1159 01/31/17 1027   01/30/17 1300  cefTRIAXone (ROCEPHIN) 1 g in dextrose 5 % 50 mL IVPB     1 g 100 mL/hr over 30 Minutes Intravenous Every 24 hours 01/30/17 1159 02/04/17 1259   01/30/17 1200  azithromycin (ZITHROMAX) tablet 500 mg     500 mg Oral  Once 01/30/17 1159 01/30/17 1306   01/27/17 2200  cefTRIAXone (ROCEPHIN) 1 g in dextrose 5 % 50 mL IVPB  Status:  Discontinued     1 g 100 mL/hr over 30 Minutes Intravenous Every 24 hours 01/27/17 0241 01/28/17 0810   01/27/17 2000  azithromycin (ZITHROMAX) 500 mg in dextrose 5 % 250 mL IVPB  Status:  Discontinued     500 mg 250 mL/hr over 60 Minutes Intravenous Every 24 hours 01/27/17 0241 01/28/17 0810   01/26/17 2030  cefTRIAXone (ROCEPHIN) 1 g in dextrose 5 % 50 mL IVPB     1 g 100 mL/hr over 30 Minutes Intravenous  Once 01/26/17 2016 01/26/17 2152   01/26/17 2030  azithromycin (ZITHROMAX) 500 mg in dextrose 5 % 250 mL IVPB     500 mg 250 mL/hr over 60 Minutes Intravenous  Once 01/26/17 2016 01/26/17 2220      Subjective:   Kimberly Downs seen and examined today.  Continues to feel weak and have cough. States when she coughs, she is unable to control her bowel movements. Has been having black diarrhea type BM. Denies abdominal pain, nausea, vomiting, chest pain. Feels breathing has improved, but cough causes her to become short of breath and feel pressure. Patient having a dry cough.  Objective:   Vitals:   01/30/17 2053 01/31/17 0432 01/31/17 0850 01/31/17 0852  BP:  140/60    Pulse:  66    Resp:  19    Temp:  98.8 F (37.1 C)    TempSrc:  Oral    SpO2: 98% 94% 97% 97%  Weight:  49.9 kg (110 lb)    Height:        Intake/Output Summary (Last 24 hours) at 01/31/17 1029 Last data filed at 01/30/17 1700  Gross per 24 hour  Intake              530 ml  Output              420  ml  Net              110 ml   Filed Weights   01/30/17 0456 01/30/17 1100 01/31/17 0432  Weight: 5.352 kg (11 lb 12.8 oz) 49.8 kg (109 lb 12.8 oz) 49.9 kg (110 lb)   Exam  General: Well developed, elderly, ill-appearing  HEENT: NCAT,  mucous membranes moist.   Cardiovascular: S1 S2 auscultated, RRR, no murmurs appreciated  Respiratory: Diminished breath sounds, but clear. No wheezing this morning. +cough  Abdomen: Soft, nontender, nondistended, + bowel sounds  Extremities: warm dry without cyanosis clubbing or edema  Neuro: AAOx3, nonfocal  Psych: Normal affect and demeanor  Data Reviewed: I have personally reviewed following labs and imaging studies  CBC:  Recent Labs Lab 01/26/17 1852 01/27/17 1014 01/28/17 0549 01/29/17 0508 01/31/17 0540  WBC 14.0* 13.2* 18.4* 26.8* 19.2*  HGB 9.5* 8.7* 9.3* 9.0* 8.4*  HCT 30.4* 28.5* 29.8* 29.0* 26.5*  MCV 80.0 81.0 81.9 81.5 81.3  PLT 475* 403* 449* 443* 630*   Basic Metabolic Panel:  Recent Labs Lab 01/26/17 1852 01/27/17 1014 01/27/17 1910 01/28/17 0549 01/28/17 1805  01/29/17 0508 01/30/17 0152  NA 132* 136  --  138  --  134* 135  K 3.4* 3.2*  --  4.2  --  5.1 4.6  CL 95* 100*  --  105  --  100* 101  CO2 24 26  --  25  --  26 27  GLUCOSE 143* 151*  --  151*  --  186* 159*  BUN 13 16  --  33*  --  33* 26*  CREATININE 0.94 0.97  --  1.00  --  1.02* 0.98  CALCIUM 9.0 8.7*  --  9.4  --  9.1 8.7*  MG  --  2.4 2.5* 2.4 2.2  --   --   PHOS  --  3.5  3.5 2.8 3.2 2.1*  --   --    GFR: Estimated Creatinine Clearance: 40.9 mL/min (by C-G formula based on SCr of 0.98 mg/dL). Liver Function Tests: No results for input(s): AST, ALT, ALKPHOS, BILITOT, PROT, ALBUMIN in the last 168 hours. No results for input(s): LIPASE, AMYLASE in the last 168 hours. No results for input(s): AMMONIA in the last 168 hours. Coagulation Profile:  Recent Labs Lab 01/27/17 0304  INR 1.12   Cardiac Enzymes:  Recent Labs Lab  01/27/17 0023 01/27/17 0559 01/27/17 1014  TROPONINI 0.05* 0.04* 0.04*   BNP (last 3 results) No results for input(s): PROBNP in the last 8760 hours. HbA1C: No results for input(s): HGBA1C in the last 72 hours. CBG:  Recent Labs Lab 01/30/17 0806 01/30/17 1127 01/30/17 1618 01/30/17 2044 01/31/17 0600  GLUCAP 152* 152* 159* 209* 141*   Lipid Profile: No results for input(s): CHOL, HDL, LDLCALC, TRIG, CHOLHDL, LDLDIRECT in the last 72 hours. Thyroid Function Tests: No results for input(s): TSH, T4TOTAL, FREET4, T3FREE, THYROIDAB in the last 72 hours. Anemia Panel:  Recent Labs  01/31/17 0744  VITAMINB12 1,399*  FOLATE 13.2  FERRITIN 108  TIBC 340  IRON 40  RETICCTPCT 1.2   Urine analysis:    Component Value Date/Time   COLORURINE YELLOW 02/03/2016 1309   APPEARANCEUR CLEAR 02/03/2016 1309   LABSPEC 1.011 02/03/2016 1309   PHURINE 5.5 02/03/2016 1309   GLUCOSEU NEGATIVE 02/03/2016 1309   HGBUR LARGE (A) 02/03/2016 1309   BILIRUBINUR NEGATIVE 02/03/2016 1309   KETONESUR NEGATIVE 02/03/2016 1309   PROTEINUR NEGATIVE 02/03/2016 1309   UROBILINOGEN 0.2 05/25/2015 1400   NITRITE NEGATIVE 02/03/2016 1309   LEUKOCYTESUR NEGATIVE 02/03/2016 1309   Sepsis Labs: @LABRCNTIP (procalcitonin:4,lacticidven:4)  ) Recent Results (from the past 240 hour(s))  Blood culture (routine x 2)     Status: None (Preliminary result)   Collection Time: 01/26/17  8:58 PM  Result Value Ref Range Status   Specimen Description BLOOD RIGHT ARM  Final   Special Requests   Final    BOTTLES DRAWN AEROBIC AND ANAEROBIC Blood Culture adequate volume   Culture NO GROWTH 4 DAYS  Final   Report Status PENDING  Incomplete  Blood culture (routine x 2)     Status: None (Preliminary result)   Collection Time: 01/26/17  9:07 PM  Result Value Ref Range Status   Specimen Description BLOOD RIGHT ARM  Final   Special Requests   Final    BOTTLES DRAWN AEROBIC AND ANAEROBIC Blood Culture adequate  volume   Culture NO GROWTH 4 DAYS  Final   Report Status PENDING  Incomplete  MRSA PCR Screening     Status: None   Collection Time: 01/27/17  3:25 AM  Result  Value Ref Range Status   MRSA by PCR NEGATIVE NEGATIVE Final    Comment:        The GeneXpert MRSA Assay (FDA approved for NASAL specimens only), is one component of a comprehensive MRSA colonization surveillance program. It is not intended to diagnose MRSA infection nor to guide or monitor treatment for MRSA infections.   Culture, respiratory (NON-Expectorated)     Status: None   Collection Time: 01/27/17  4:32 AM  Result Value Ref Range Status   Specimen Description TRACHEAL ASPIRATE  Final   Special Requests NONE  Final   Gram Stain   Final    ABUNDANT WBC PRESENT, PREDOMINANTLY PMN RARE GRAM POSITIVE COCCI    Culture NO GROWTH 2 DAYS  Final   Report Status 01/29/2017 FINAL  Final      Radiology Studies: No results found.   Scheduled Meds: . arformoterol  15 mcg Nebulization BID  . atorvastatin  10 mg Oral QHS  . benzonatate  200 mg Oral TID  . budesonide (PULMICORT) nebulizer solution  0.5 mg Nebulization BID  . chlorhexidine  15 mL Mouth Rinse BID  . dextromethorphan  30 mg Oral BID  . guaiFENesin  1,200 mg Oral BID  . insulin aspart  0-9 Units Subcutaneous TID WC  . isosorbide mononitrate  30 mg Oral Daily  . mouth rinse  15 mL Mouth Rinse q12n4p  . methylPREDNISolone (SOLU-MEDROL) injection  40 mg Intravenous Daily  . multivitamin with minerals  1 tablet Oral Daily  . pantoprazole  40 mg Oral Daily  . pentoxifylline  400 mg Oral Daily  . rOPINIRole  2 mg Oral QHS  . verapamil  180 mg Oral Daily   Continuous Infusions: . cefTRIAXone (ROCEPHIN)  IV Stopped (01/30/17 1336)     LOS: 4 days   Time Spent in minutes   30 minutes  Ladarrell Cornwall D.O. on 01/31/2017 at 10:29 AM  Between 7am to 7pm - Pager - [phone]  After 7pm go to [email or web address] - password [email or web address]  And look for the night  coverage person covering for me after hours  [facility] Office  [phone]

## 2017-01-31 NOTE — Progress Notes (Signed)
Pt is having liquid black/dark brown stools and her hemoglobin has dropped to 8.4 from when it was 9.0 yesterday.  Will inform on coming shift.  Lupita Dawn, RN

## 2017-01-31 NOTE — Progress Notes (Signed)
[facility] Attending Note  ADMISSION DATE:  01/26/2017  CONSULTATION DATE:  01/27/2017  REFERRING MD:  Dr. Jeneen Rinks  CHIEF COMPLAINT:  Dyspnea   Presenting HPI:  71 y.o. femalewith CAD and COPD was admitted on 5/30 for acute respiratory failure in the setting of a COPD exacerbation and afib.  She required intubation. Patient subsequently extubated on 6/1.  Subjective:  Patient reports continued intermittent, nonproductive cough. No wheezing. Dyspnea with only marginal improvement. Denies any subjective fever or chills.   Review of Systems:  Denies any chest pain or pressure. Mild nausea with abdominal bloating & melena.   Temp:  [98.7 F (37.1 C)-98.9 F (37.2 C)] 98.8 F (37.1 C) (06/04 0432) Pulse Rate:  [66-80] 76 (06/04 1149) Resp:  [19-20] 19 (06/04 1149) BP: (129-144)/(60-72) 130/72 (06/04 1149) SpO2:  [94 %-98 %] 95 % (06/04 1149) Weight:  [110 lb (49.9 kg)] 110 lb (49.9 kg) (06/04 0432)   General:  Awake. No distress. Alert. Integument:  Warm & dry. No rash on exposed skin. HEENT:  No scleral icterus or injection. Pupils symmetric.  Pulmonary:  Clear bilaterally to auscultation. No accessory muscle use on room air. Good aeration bilaterally. Intermittent cough witnessed. Cardiovascular:  Regular rate. No JVD apprecaited. No edema. Abdomen:  Soft. Nontender. Normal bowel sounds. Neurological:  Cranial nerves grossly in tact. No meningismus. Moving all 4 extremities equally. Oriented x4.   CBC Latest Ref Rng & Units 01/31/2017 01/29/2017 01/28/2017  WBC 4.0 - 10.5 K/uL 19.2(H) 26.8(H) 18.4(H)  Hemoglobin 12.0 - 15.0 g/dL 8.4(L) 9.0(L) 9.3(L)  Hematocrit 36.0 - 46.0 % 26.5(L) 29.0(L) 29.8(L)  Platelets 150 - 400 K/uL 450(H) 443(H) 449(H)    BMP Latest Ref Rng & Units 01/30/2017 01/29/2017 01/28/2017  Glucose 65 - 99 mg/dL 159(H) 186(H) 151(H)  BUN 6 - 20 mg/dL 26(H) 33(H) 33(H)  Creatinine 0.44 - 1.00 mg/dL 0.98 1.02(H) 1.00  BUN/Creat Ratio 12 - 28 - - -   Sodium 135 - 145 mmol/L 135 134(L) 138  Potassium 3.5 - 5.1 mmol/L 4.6 5.1 4.2  Chloride 101 - 111 mmol/L 101 100(L) 105  CO2 22 - 32 mmol/L 27 26 25   Calcium 8.9 - 10.3 mg/dL 8.7(L) 9.1 9.4    IMAGING/STUDIES: PORT CXR 6/1:  Personally reviewed by me. Endotracheal tube in place. Persistent increased bilateral interstitial markings. No pleural effusion appreciated.   MICROBIOLOGY: MRSA PCR 5/31:  Negative Blood Cultures x2 5/30:  Negative  Tracheal Aspirate Culture 5/31:  Rare GPC Influenza A/B PCR 5/31:  Negative  Urine Streptococcal Antigen 5/31:  Negative  Urine Legionella Antigen 5/31:  Negative   ANTIBIOTICS: Azithromycin 5/30 - 6/4 Rocephin 5/30 >>> (stop date 6/8)  ASSESSMENT/PLAN:  71 y.o.  female admitted with acute on chronic hypoxic respiratory failure secondary to COPD exacerbation. Patient did develop atrial fibrillation with rapid ventricular response. Patient now having reported melena. Successfully extubated with continuously improving respiratory status.  1. Acute on chronic hypoxic respiratory failure: Continuing to wean FiO2. Continuing pulmonary toilet with flutter valve. 2. COPD with acute exacerbation: Transitioning off of Brovana and Pulmicort to [place]. Completing course of empiric antibiotics. Recommend a two-week taper of prednisone. Plan for outpatient PFTs after she recovers. Continuing current cough suppression regimen.  3. Acute encephalopathy: Likely multifactorial from hypercarbia and hypoxia. Resolved. 4. Tobacco use disorder:  Encouraged continued abstinence after discharge.  5. Follow-up:  Patient scheduled for an office follow-up on 6/13 at 11:00am with Noe Gens, NP.   I  have spent a total of 36 minutes of time today caring for the patient, reviewing the patient's electronic medical record, and with more than 50% of that time spent coordinating care with the patient as well as reviewing the continuing plan of care with the patient at  bedside.  Remainder of care as per primary service. We will be available as needed. Please let me know if there are any further questions or concerns.   Sonia Baller Ashok Cordia, M.D. [facility] Pager:  [phone] After 3pm or if no response, call [phone] 1:34 PM 01/31/17

## 2017-01-31 NOTE — Progress Notes (Signed)
Physical Therapy Treatment Patient Details Name: Kimberly Downs MRN: [id number] DOB: 24-Apr-1946 Today's Date: 01/31/2017    History of Present Illness Kimberly Downs is a 71 y.o. female with medical history significant of ASCVD s/p stent placement and abdominal aorta angiogram with 50% stenosis per patient report with h/o DM, HTN, HLD, and COPD not on home O2 presenting with SOB.  Patient admitted for bronchitis and bronchopneumonia. Developed A. fib RVR on the emergency department. Required Cardizem drip and was subsequently intubated. She was extubated on 01/28/2017. 6/4 concern for GIB    PT Comments    Pt very pleasant and willing to ambulate but reports fatigue with drop in Hgb. Pt wanted to walk without RW but unable today. Pt encouraged to mobilize with nursing and therapy to continue to follow to return to independent gait and ability for stairs to mobilize at home.   SpO2 90-95% onRA with gait   Follow Up Recommendations  Home health PT;Supervision - Intermittent     Equipment Recommendations       Recommendations for Other Services       Precautions / Restrictions Precautions Precautions: Fall    Mobility  Bed Mobility Overal bed mobility: Modified Independent                Transfers Overall transfer level: Modified independent                  Ambulation/Gait Ambulation/Gait assistance: Min guard Ambulation Distance (Feet): 150 Feet Assistive device: Rolling walker (2 wheeled) Gait Pattern/deviations: Step-through pattern;Decreased stride length   Gait velocity interpretation: Below normal speed for age/gender General Gait Details: cues for position in RW. 1 standing rest. Pt reported lightheadeness with gait with need for sitting in chair for return to room. Pt intiated gait without RW but was unable to maintain more than a few feet and required support for gait   Stairs            Wheelchair Mobility    Modified Rankin (Stroke Patients  Only)       Balance Overall balance assessment: Needs assistance   Sitting balance-Leahy Scale: Normal       Standing balance-Leahy Scale: Fair                              Cognition Arousal/Alertness: Awake/alert Behavior During Therapy: WFL for tasks assessed/performed Overall Cognitive Status: Within Functional Limits for tasks assessed                                        Exercises      General Comments        Pertinent Vitals/Pain Pain Assessment: No/denies pain    Home Living                      Prior Function            PT Goals (current goals can now be found in the care plan section) Progress towards PT goals: Progressing toward goals    Frequency           PT Plan Current plan remains appropriate    Co-evaluation              AM-PAC PT "6 Clicks" Daily Activity  Outcome Measure  Difficulty turning over in bed (including adjusting bedclothes, sheets and  blankets)?: None Difficulty moving from lying on back to sitting on the side of the bed? : None Difficulty sitting down on and standing up from a chair with arms (e.g., wheelchair, bedside commode, etc,.)?: None Help needed moving to and from a bed to chair (including a wheelchair)?: None Help needed walking in hospital room?: A Little Help needed climbing 3-5 steps with a railing? : A Little 6 Click Score: 22    End of Session Equipment Utilized During Treatment: Gait belt Activity Tolerance: Patient limited by fatigue Patient left: in bed;with call bell/phone within reach Nurse Communication: Mobility status PT Visit Diagnosis: Other abnormalities of gait and mobility (R26.89);Muscle weakness (generalized) (M62.81)     Time: 2194-7125 PT Time Calculation (min) (ACUTE ONLY): 20 min  Charges:  $Gait Training: 8-22 mins                    G Codes:       Elwyn Reach, PT [phone]    [place] 01/31/2017, 1:57 PM

## 2017-01-31 NOTE — Care Management Important Message (Signed)
Important Message  Patient Details  Name: Kimberly Downs MRN: [id number] Date of Birth: [birth year]   Medicare Important Message Given:  Yes    Nathen May 01/31/2017, 12:14 PM
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
## 2017-02-01 ENCOUNTER — Encounter (HOSPITAL_COMMUNITY)
Admission: EM | Disposition: A | Discharge status: Home Health | Payer: Self-pay | Source: Home / Self Care | Attending: Internal Medicine

## 2017-02-01 ENCOUNTER — Inpatient Hospital Stay (HOSPITAL_COMMUNITY): Payer: Medicare Other | Admitting: Anesthesiology

## 2017-02-01 ENCOUNTER — Encounter (HOSPITAL_COMMUNITY): Payer: Self-pay | Admitting: Emergency Medicine

## 2017-02-01 DIAGNOSIS — R197 Diarrhea, unspecified: Secondary | ICD-10-CM

## 2017-02-01 HISTORY — PX: ESOPHAGOGASTRODUODENOSCOPY (EGD) WITH PROPOFOL: SHX5813

## 2017-02-01 LAB — BASIC METABOLIC PANEL
Anion gap: 8 (ref 5–15)
BUN: 18 mg/dL (ref 6–20)
CALCIUM: 8.6 mg/dL — AB (ref 8.9–10.3)
CO2: 27 mmol/L (ref 22–32)
CREATININE: 1.01 mg/dL — AB (ref 0.44–1.00)
Chloride: 100 mmol/L — ABNORMAL LOW (ref 101–111)
GFR, EST NON AFRICAN AMERICAN: 54 mL/min — AB (ref >60)
Glucose, Bld: 146 mg/dL — ABNORMAL HIGH (ref 65–99)
Potassium: 4.4 mmol/L (ref 3.5–5.1)
Sodium: 135 mmol/L (ref 135–145)

## 2017-02-01 LAB — CBC
HCT: 26.7 % — ABNORMAL LOW (ref 36.0–46.0)
HEMOGLOBIN: 8.2 g/dL — AB (ref 12.0–15.0)
MCH: 24.8 pg — ABNORMAL LOW (ref 26.0–34.0)
MCHC: 30.7 g/dL (ref 30.0–36.0)
MCV: 80.7 fL (ref 78.0–100.0)
PLATELETS: 423 10*3/uL — AB (ref 150–400)
RBC: 3.31 MIL/uL — ABNORMAL LOW (ref 3.87–5.11)
RDW: 14.2 % (ref 11.5–15.5)
WBC: 18.3 10*3/uL — ABNORMAL HIGH (ref 4.0–10.5)

## 2017-02-01 LAB — GLUCOSE, CAPILLARY
GLUCOSE-CAPILLARY: 275 mg/dL — AB (ref 65–99)
GLUCOSE-CAPILLARY: 140 mg/dL — AB (ref 65–99)
Glucose-Capillary: 109 mg/dL — ABNORMAL HIGH (ref 65–99)
Glucose-Capillary: 189 mg/dL — ABNORMAL HIGH (ref 65–99)

## 2017-02-01 SURGERY — ESOPHAGOGASTRODUODENOSCOPY (EGD) WITH PROPOFOL
Anesthesia: Monitor Anesthesia Care | Laterality: Left

## 2017-02-01 MED ORDER — FLUCONAZOLE 100 MG PO TABS
100.0000 mg | ORAL_TABLET | Freq: Every day | ORAL | Status: DC
  Administered 2017-02-01 – 2017-02-02 (×2): 100 mg via ORAL
  Filled 2017-02-01 (×2): qty 1

## 2017-02-01 MED ORDER — GABAPENTIN 100 MG PO CAPS
100.0000 mg | ORAL_CAPSULE | Freq: Every day | ORAL | Status: DC
  Administered 2017-02-01: 100 mg via ORAL
  Filled 2017-02-01: qty 1

## 2017-02-01 MED ORDER — BENZONATATE 100 MG PO CAPS: 200.0000 mg | ORAL_CAPSULE | Freq: Two times a day (BID) | ORAL | Status: DC

## 2017-02-01 MED ORDER — PROPOFOL 500 MG/50ML IV EMUL
INTRAVENOUS | Status: DC | PRN
  Administered 2017-02-01: 100 ug/kg/min via INTRAVENOUS

## 2017-02-01 MED ORDER — HYDROCHLOROTHIAZIDE 12.5 MG PO CAPS
12.5000 mg | ORAL_CAPSULE | Freq: Every day | ORAL | Status: DC
  Administered 2017-02-01 – 2017-02-02 (×2): 12.5 mg via ORAL
  Filled 2017-02-01 (×2): qty 1

## 2017-02-01 MED ORDER — PANTOPRAZOLE SODIUM 40 MG PO TBEC
40.0000 mg | DELAYED_RELEASE_TABLET | Freq: Every day | ORAL | Status: DC
  Administered 2017-02-02: 40 mg via ORAL
  Filled 2017-02-01: qty 1

## 2017-02-01 MED ORDER — IRBESARTAN 150 MG PO TABS
150.0000 mg | ORAL_TABLET | Freq: Every day | ORAL | Status: DC
  Administered 2017-02-01 – 2017-02-02 (×2): 150 mg via ORAL
  Filled 2017-02-01 (×2): qty 1

## 2017-02-01 MED ORDER — PROPOFOL 10 MG/ML IV BOLUS
INTRAVENOUS | Status: DC | PRN
  Administered 2017-02-01: 20 mg via INTRAVENOUS

## 2017-02-01 MED ORDER — VANCOMYCIN 50 MG/ML ORAL SOLUTION
125.0000 mg | Freq: Four times a day (QID) | ORAL | Status: DC
  Administered 2017-02-01: 125 mg via ORAL
  Filled 2017-02-01 (×2): qty 2.5

## 2017-02-01 MED ORDER — LACTATED RINGERS IV SOLN
INTRAVENOUS | Status: DC | PRN
  Administered 2017-02-01: 09:00:00 via INTRAVENOUS

## 2017-02-01 MED ORDER — IRBESARTAN-HYDROCHLOROTHIAZIDE 150-12.5 MG PO TABS: 1.0000 | ORAL_TABLET | Freq: Every day | ORAL | Status: DC

## 2017-02-01 MED ORDER — FENTANYL CITRATE (PF) 100 MCG/2ML IJ SOLN: 25.0000 ug | INTRAMUSCULAR | Status: DC | PRN

## 2017-02-01 MED ORDER — LOPERAMIDE HCL 2 MG PO CAPS
2.0000 mg | ORAL_CAPSULE | ORAL | Status: DC | PRN
  Administered 2017-02-02: 2 mg via ORAL
  Filled 2017-02-01: qty 1

## 2017-02-01 MED ORDER — ONDANSETRON HCL 4 MG/2ML IJ SOLN: 4.0000 mg | Freq: Once | INTRAMUSCULAR | Status: DC | PRN

## 2017-02-01 MED ORDER — ALBUTEROL SULFATE (2.5 MG/3ML) 0.083% IN NEBU
2.5000 mg | INHALATION_SOLUTION | Freq: Three times a day (TID) | RESPIRATORY_TRACT | Status: DC
  Administered 2017-02-02: 2.5 mg via RESPIRATORY_TRACT
  Filled 2017-02-01: qty 3

## 2017-02-01 MED ORDER — PANTOPRAZOLE SODIUM 40 MG PO TBEC: 40.0000 mg | DELAYED_RELEASE_TABLET | Freq: Every day | ORAL | Status: DC | PRN

## 2017-02-01 NOTE — Op Note (Signed)
[facility] Patient Name: Kimberly Downs Procedure Date : 02/01/2017 MRN: [id number] Attending MD: [name] , MD Date of Birth: 20-Jul-1946 CSN: [id number] Age: 71 Admit Type: Inpatient Procedure:                Upper GI endoscopy Indications:              Iron deficiency anemia secondary to chronic blood                            loss, Melena Providers:                [name], MD, Cleda Daub, RN, Elspeth Cho                            Tech., Technician, Alan Mulder, Technician Referring MD:              Medicines:                Monitored Anesthesia Care Complications:            No immediate complications. Estimated Blood Loss:     Estimated blood loss: none. Procedure:                Pre-Anesthesia Assessment:                           - Prior to the procedure, a History and Physical                            was performed, and patient medications and                            allergies were reviewed. The patient's tolerance of                            previous anesthesia was also reviewed. The risks                            and benefits of the procedure and the sedation                            options and risks were discussed with the patient.                            All questions were answered, and informed consent                            was obtained. Prior Anticoagulants: The patient has                            taken ASA,Plavix and Lovenox, last doses were 1 day                            prior to procedure. ASA Grade Assessment: III - A  patient with severe systemic disease. After                            reviewing the risks and benefits, the patient was                            deemed in satisfactory condition to undergo the                            procedure.                           After obtaining informed consent, the endoscope was                            passed under direct vision. Throughout  the                            procedure, the patient's blood pressure, pulse, and                            oxygen saturations were monitored continuously. The                            [id number]-[id number] ([id number]) scope was introduced through the                            mouth, and advanced to the second part of duodenum.                            The upper GI endoscopy was accomplished without                            difficulty. The patient tolerated the procedure                            well. Scope In: Scope Out: Findings:      Patchy candidiasis was found in the upper third of the esophagus, in the       middle third of the esophagus and in the lower third of the esophagus.       Cells for cytology were obtained by brushing.      The Z-line was regular and was found 35 cm from the incisors.      Few non-bleeding superficial gastric ulcers with a clean ulcer base       (Forrest Class III) were found in the gastric antrum. The largest lesion       was 6 mm in largest dimension.      Localized mildly erythematous mucosa without bleeding was found in the       gastric antrum.      A few dispersed, diminutive non-bleeding erosions were found in the       gastric body. There were no stigmata of recent bleeding.      The cardia and gastric fundus were normal on retroflexion.      The examined duodenum was normal.  Biopsy is contraindicated because the patient is taking anticoagulation       medication. Impression:               - Monilial esophagitis. Cells for cytology obtained.                           - Z-line regular, 35 cm from the incisors.                           - Non-bleeding gastric ulcers with a clean ulcer                            base (Forrest Class III).                           - Erythematous mucosa in the antrum.                           - Non-bleeding erosive gastropathy.                           - Normal examined duodenum.                           -  Biopsy contraindicated. Moderate Sedation:      Patient did not receive moderate sedation for this procedure, but       instead received monitored anesthesia care. Recommendation:           - Admit the patient to hospital ward for ongoing                            care.                           - Resume regular diet.                           - Use Protonix (pantoprazole) 40 mg PO daily for 4                            weeks.                           - Diflucan (fluconazole) 100 mg PO daily for 7 days.                           - Resume aspirin today and Plavix (clopidogrel)                            today at prior doses. Refer to referring physician                            for further adjustment of therapy.                           - Vancocin (vancomycin) 125 mg PO QID for 2  weeks.C                            diff Ag is positive, Toxin negative but patient has                            more than 5 loose stools a day. Procedure Code(s):        --- Professional ---                           [phone], Esophagogastroduodenoscopy, flexible,                            transoral; diagnostic, including collection of                            specimen(s) by brushing or washing, when performed                            (separate procedure) Diagnosis Code(s):        --- Professional ---                           B37.81, Candidal esophagitis                           K25.9, Gastric ulcer, unspecified as acute or                            chronic, without hemorrhage or perforation                           K31.89, Other diseases of stomach and duodenum                           D50.0, Iron deficiency anemia secondary to blood                            loss (chronic)                           K92.1, Melena (includes Hematochezia) CPT copyright 2016 American Medical Association. All rights reserved. The codes documented in this report are preliminary and upon coder review may  be revised  to meet current compliance requirements. [name], MD 02/01/2017 9:57:08 AM This report has been signed electronically. Number of Addenda: 0

## 2017-02-01 NOTE — Anesthesia Procedure Notes (Signed)
Procedure Name: MAC Date/Time: 02/01/2017 9:30 AM Performed by: Eligha Bridegroom Pre-anesthesia Checklist: Patient identified, Emergency Drugs available, Suction available, Patient being monitored and Timeout performed Patient Re-evaluated:Patient Re-evaluated prior to inductionPreoxygenation: Pre-oxygenation with 100% oxygen Intubation Type: IV induction

## 2017-02-01 NOTE — Anesthesia Postprocedure Evaluation (Signed)
Anesthesia Post Note  Patient: Kimberly Downs  Procedure(s) Performed: Procedure(s) (LRB): ESOPHAGOGASTRODUODENOSCOPY (EGD) WITH PROPOFOL (Left)     Patient location during evaluation: Endoscopy Anesthesia Type: MAC Pain management: pain level controlled Vital Signs Assessment: post-procedure vital signs reviewed and stable Respiratory status: spontaneous breathing, nonlabored ventilation and respiratory function stable Cardiovascular status: blood pressure returned to baseline Postop Assessment: no headache Anesthetic complications: no    Last Vitals:  Vitals:   02/01/17 1010 02/01/17 1257  BP: (!) 150/69 133/67  Pulse:  71  Resp: 20 18  Temp:  36.8 C    Last Pain:  Vitals:   02/01/17 1631  TempSrc:   PainSc: 0-No pain                 [name] COKER

## 2017-02-01 NOTE — Brief Op Note (Signed)
01/26/2017 - 02/01/2017  9:57 AM  PATIENT:  Kimberly Downs  71 y.o. female  PRE-OPERATIVE DIAGNOSIS:  Melena,anemia, on antiplatelets  POST-OPERATIVE DIAGNOSIS:  antral ulcers/candidi esophagus  PROCEDURE:  Procedure(s): ESOPHAGOGASTRODUODENOSCOPY (EGD) WITH PROPOFOL (Left)  SURGEON:  Surgeon(s) and Role:    [name], MD - Primary  PHYSICIAN ASSISTANT:   ASSISTANTS: none   ANESTHESIA:   MAC  EBL:  No intake/output data recorded.  BLOOD ADMINISTERED:none  DRAINS: none   LOCAL MEDICATIONS USED:  NONE  SPECIMEN:  Source of Specimen:  cytology  DISPOSITION OF SPECIMEN:  N/A  COUNTS:  YES  TOURNIQUET:  * No tourniquets in log *  DICTATION: .Dragon Dictation  PLAN OF CARE: Admit to inpatient   PATIENT DISPOSITION:  PACU - hemodynamically stable.   Delay start of Pharmacological VTE agent (>24hrs) due to surgical blood loss or risk of bleeding: no

## 2017-02-01 NOTE — Transfer of Care (Signed)
Immediate Anesthesia Transfer of Care Note  Patient: Kimberly Downs  Procedure(s) Performed: Procedure(s): ESOPHAGOGASTRODUODENOSCOPY (EGD) WITH PROPOFOL (Left)  Patient Location: PACU  Anesthesia Type:MAC  Level of Consciousness: awake, alert  and oriented  Airway & Oxygen Therapy: Patient Spontanous Breathing  Post-op Assessment: Report given to RN and Post -op Vital signs reviewed and stable  Post vital signs: Reviewed and stable  Last Vitals:  Vitals:   02/01/17 0825 02/01/17 0955  BP: (!) 164/50   Pulse: 68 74  Resp: 18 (!) 21  Temp: 36.8 C 36.9 C    Last Pain:  Vitals:   02/01/17 0955  TempSrc: Oral  PainSc:       Patients Stated Pain Goal: 2 (54/62/70 3500)  Complications: No apparent anesthesia complications

## 2017-02-01 NOTE — Interval H&P Note (Signed)
History and Physical Interval Note: 71 year old female with melena and anemia. Off aspirin, Plavix and Lovenox since yesterday. Has diarrhea tested positive for C. difficile antigen, negative for toxin.  02/01/2017 8:56 AM  Kimberly Downs  has presented today for EGD with the diagnosis of Melena,anemia, on antiplatelets  The various methods of treatment have been discussed with the patient and family. After consideration of risks, benefits and other options for treatment, the patient has consented to  Procedure(s): ESOPHAGOGASTRODUODENOSCOPY (EGD) WITH PROPOFOL (Left) as a surgical intervention .  The patient's history has been reviewed, patient examined, no change in status, stable for surgery.  I have reviewed the patient's chart and labs.  Questions were answered to the patient's satisfaction.     [name]

## 2017-02-01 NOTE — Progress Notes (Signed)
Physical Therapy Treatment Patient Details Name: Kimberly Downs MRN: [id number] DOB: [birth year] Today's Date: 02/01/2017    History of Present Illness Kimberly Downs is a 71 y.o. female with medical history significant of ASCVD s/p stent placement and abdominal aorta angiogram with 50% stenosis per patient report with h/o DM, HTN, HLD, and COPD not on home O2 presenting with SOB.  Patient admitted for bronchitis and bronchopneumonia. Developed A. fib RVR on the emergency department. Required Cardizem drip and was subsequently intubated. She was extubated on 01/28/2017. 6/4 concern for GIB    PT Comments    Pt progressing well with mobility. Should be able to return home with family when medically ready.   Follow Up Recommendations  Supervision - Intermittent;No PT follow up     Equipment Recommendations       Recommendations for Other Services       Precautions / Restrictions Precautions Precautions: Fall Restrictions Weight Bearing Restrictions: No    Mobility  Bed Mobility               General bed mobility comments: Pt up on EOB  Transfers Overall transfer level: Modified independent Equipment used: 4-wheeled walker Transfers: Sit to/from Stand Sit to Stand: Modified independent (Device/Increase time)            Ambulation/Gait Ambulation/Gait assistance: Supervision Ambulation Distance (Feet): 150 Feet Assistive device: 4-wheeled walker Gait Pattern/deviations: Step-through pattern;Decreased stride length Gait velocity: decreased Gait velocity interpretation: Below normal speed for age/gender General Gait Details: Used rollator throughout with incr stability.   Stairs            Wheelchair Mobility    Modified Rankin (Stroke Patients Only)       Balance Overall balance assessment: Needs assistance Sitting-balance support: No upper extremity supported;Feet supported Sitting balance-Leahy Scale: Normal     Standing balance support: No  upper extremity supported Standing balance-Leahy Scale: Fair                              Cognition Arousal/Alertness: Awake/alert Behavior During Therapy: WFL for tasks assessed/performed Overall Cognitive Status: Within Functional Limits for tasks assessed                                        Exercises      General Comments        Pertinent Vitals/Pain Pain Assessment: No/denies pain    Home Living                      Prior Function            PT Goals (current goals can now be found in the care plan section) Progress towards PT goals: Progressing toward goals    Frequency    Min 3X/week      PT Plan Discharge plan needs to be updated    Co-evaluation              AM-PAC PT "6 Clicks" Daily Activity  Outcome Measure  Difficulty turning over in bed (including adjusting bedclothes, sheets and blankets)?: None Difficulty moving from lying on back to sitting on the side of the bed? : None Difficulty sitting down on and standing up from a chair with arms (e.g., wheelchair, bedside commode, etc,.)?: None Help needed moving to and from a bed to chair (including a  wheelchair)?: None Help needed walking in hospital room?: A Little Help needed climbing 3-5 steps with a railing? : A Little 6 Click Score: 22    End of Session Equipment Utilized During Treatment: Gait belt Activity Tolerance: Patient tolerated treatment well Patient left: in bed;with call bell/phone within reach (sitting) Nurse Communication: Mobility status PT Visit Diagnosis: Other abnormalities of gait and mobility (R26.89);Muscle weakness (generalized) (M62.81)     Time: 1572-6203 PT Time Calculation (min) (ACUTE ONLY): 18 min  Charges:  $Gait Training: 8-22 mins                    G Codes:       [facility] PT [place] 02/01/2017, 2:34 PM

## 2017-02-01 NOTE — Anesthesia Preprocedure Evaluation (Addendum)
Anesthesia Evaluation  Patient identified by MRN, date of birth, ID band Patient awake    Reviewed: Allergy & Precautions, NPO status , Patient's Chart, lab work & pertinent test results  Airway Mallampati: II  TM Distance: >3 FB     Dental  (+) Edentulous Upper, Edentulous Lower   Pulmonary former smoker,    breath sounds clear to auscultation       Cardiovascular hypertension,  Rhythm:Regular Rate:Normal     Neuro/Psych    GI/Hepatic   Endo/Other  diabetes  Renal/GU      Musculoskeletal   Abdominal   Peds  Hematology   Anesthesia Other Findings   Reproductive/Obstetrics                            Anesthesia Physical Anesthesia Plan  ASA: III  Anesthesia Plan: MAC   Post-op Pain Management:    Induction: Intravenous  PONV Risk Score and Plan: 1 and Ondansetron and Treatment may vary due to age  Airway Management Planned: Nasal Cannula and Natural Airway  Additional Equipment:   Intra-op Plan:   Post-operative Plan:   Informed Consent: I have reviewed the patients History and Physical, chart, labs and discussed the procedure including the risks, benefits and alternatives for the proposed anesthesia with the patient or authorized representative who has indicated his/her understanding and acceptance.     Plan Discussed with: CRNA and Anesthesiologist  Anesthesia Plan Comments:         Anesthesia Quick Evaluation

## 2017-02-01 NOTE — Progress Notes (Signed)
PT Cancellation Note  Patient Details Name: Kimberly Downs MRN: [id number] DOB: February 03, 1946   Cancelled Treatment:    Reason Eval/Treat Not Completed: Patient at procedure or test/unavailable   Shary Decamp [facility] 02/01/2017, 9:36 AM Suanne Marker PT [phone]

## 2017-02-01 NOTE — Progress Notes (Signed)
PROGRESS NOTE    Kimberly Downs  YWV:[id number] DOB: Aug 08, 1946 DOA: 01/26/2017 PCP: Kimberly Shelter, PA-C   No chief complaint on file.   Brief Narrative:  HPI On 01/26/2017 by Dr. Karmen Bongo [place] Downs is a 71 y.o. female with medical history significant of ASCVD s/p stent placement and abdominal aorta angiogram with 50% stenosis per patient report with h/o DM, HTN, HLD, and COPD not on home O2 presenting with SOB.  She reports that since Saturday, she has been very sick to her stomach, coughing up so much.  +SOB.  Sputum productive of light green to dark green sputum.  +chills, no fevers.  No sick contacts.  Unable to provide additional history due to marked SOB/tachypnea while on BIPAP.  Interim history Patient admitted for bronchitis and bronchopneumonia. Developed A. fib RVR on the emergency department. Required Cardizem drip and was subsequently intubated. She was extubated on 01/28/2017. [facility] assumed care on 01/30/2017. Patient now having dark melanotic stools. GI consulted s/p EGD   Assessment & Plan   Acute on chronic respiratory failure with hypoxemia/COPD exacerbation -Patient did require intubation however was successfully extubated on 01/28/2017 -Pulmonology following and appreciated -Currently requiring supplemental oxygen -Initially placed on azithromycin and ceftriaxone. Will discontinue azithromycin as this could exacerbate patient's diarrhea.  -Continue to wean steroids, will transition to PO prednisone -Patient was able to ambulate with rolling walker, and maintained O2 sats >92% on room air.  -Upon discharge patient would likely benefit from home nebulizer -Continue mucinex, delsym -Transitioned to spiriva and dulra -Pulm recommended long prednisone taper (over 2 weeks)  Normocytic Anemia / Possible GI bleed/ Melena/ Symptomatic Anemia -Baseline hemoglobin between 9-11 (reviewing records in EPIC) -Patient was placed on full dose lovenox for transient Afib on  admission, however this was discontinued. She was also on plavix and aspirin, which have both been held as well. -hemoglobin currently 8.2 -FOBT positive -obtained Anemia panel WNL, B12: 1399 -Gastroenterology consulted and appreciated -Last colonoscopy Sept 2016 showed left sided diverticula, end-to-side anastamosis at 10cm above dentate line. Biposy results were unremarkable. (colonscopy done at that time for rectal bleeding and diarrhea) -S/p EGD today: monilial esophagitis (sample sent for cytology), nonbleeding gastric ulcers with clean ulcer base, non-bleeding erosive duodenum. Biopsy contraindicated.  -Recommendations: protonix 40mg  daily x 4 weeks  Esophageal candidiasis  -Started on fluconazole 100mg  daily for 4 weeks  Diarrhea -Possibly related to the above and worsens with cough -Cdiff Ag +, however toxin negative and Cdiff PCR negative -Discussed with ID, no need for vancomycin treatment at this time -Discussed with Dr. Therisa Doyne, gastroenterology, will use imodium to aide in diarrhea cessation. If diarrhea persists may consider vancomyin  Transient atrial fibrillation with RVR/mildly elevated troponin/history of CAD -Likely secondary to respiratory status -Patient did require Cardizem drip however weaned off successfully and currently in sinus rhythm -Was placed on Lovenox full dose -CHADSVASC 3 (given gender, age, HTN) -Will discuss continued anticoagulation with cardiology -Suspect mild elevation in troponin, 0.05, 0.04 likely due to imminent ischemia from atrial fibrillation -No complaints of chest pain -GI feels that plavix and aspirin can be resumed today  Leukocytosis -Likely secondary to steroids -WBC trending downward, currently 18.3 -Currently afebrile -Continue to monitor CBC  Acute encephalopathy -Likely metabolic, has resolved  Anxiety -Continue Xanax  Essential hypertension -Continue verapamil  Chronic kidney disease, stage III -Creatinine appears to  be stable, continue to monitor BMP  Chronic systolic/diastolic heart failure -Patient appears to be euvolemic -Echocardiogram showed EF of 85-46%, grade 1 diastolic  dysfunction -Continue monitor intake and output, daily weights  Deconditioning -PT consulted and recommended home health PT and 3in1 -likely secondary to COPD exac, made worse by continued cough  DVT Prophylaxis  lovenox  Code Status: Full  Family Communication: None at bedside  Disposition Plan: Admitted. Possible discharge in 24 hours to home, pending no further drop in hemoglobin as this could increase patient's weakness and SOB.   Consultants PCCM Gastroenterology  Procedures  Intubation/extubation Echocardiogram EGD  Antibiotics   Anti-infectives    Start     Dose/Rate Route Frequency Ordered Stop   02/01/17 1200  vancomycin (VANCOCIN) 50 mg/mL oral solution 125 mg     125 mg Oral Every 6 hours 02/01/17 1037 02/15/17 1159   02/01/17 1200  fluconazole (DIFLUCAN) tablet 100 mg     100 mg Oral Daily 02/01/17 1037 02/08/17 0959   01/31/17 1000  azithromycin (ZITHROMAX) tablet 250 mg  Status:  Discontinued     250 mg Oral Daily 01/30/17 1159 01/31/17 1027   01/30/17 1300  cefTRIAXone (ROCEPHIN) 1 g in dextrose 5 % 50 mL IVPB     1 g 100 mL/hr over 30 Minutes Intravenous Every 24 hours 01/30/17 1159 02/04/17 1259   01/30/17 1200  azithromycin (ZITHROMAX) tablet 500 mg     500 mg Oral  Once 01/30/17 1159 01/30/17 1306   01/27/17 2200  cefTRIAXone (ROCEPHIN) 1 g in dextrose 5 % 50 mL IVPB  Status:  Discontinued     1 g 100 mL/hr over 30 Minutes Intravenous Every 24 hours 01/27/17 0241 01/28/17 0810   01/27/17 2000  azithromycin (ZITHROMAX) 500 mg in dextrose 5 % 250 mL IVPB  Status:  Discontinued     500 mg 250 mL/hr over 60 Minutes Intravenous Every 24 hours 01/27/17 0241 01/28/17 0810   01/26/17 2030  cefTRIAXone (ROCEPHIN) 1 g in dextrose 5 % 50 mL IVPB     1 g 100 mL/hr over 30 Minutes Intravenous  Once  01/26/17 2016 01/26/17 2152   01/26/17 2030  azithromycin (ZITHROMAX) 500 mg in dextrose 5 % 250 mL IVPB     500 mg 250 mL/hr over 60 Minutes Intravenous  Once 01/26/17 2016 01/26/17 2220      Subjective:   [name] and examined today.  Feeling better today. Feels breathing and cough have improved. Still have a few episodes of diarrhea, worsens with cough. Patient denies chest pain, abdominal pain, nausea, vomiting.  Objective:   Vitals:   02/01/17 0825 02/01/17 0955 02/01/17 1000 02/01/17 1010  BP: (!) 164/50  (!) 148/84 (!) 150/69  Pulse: 68 74 73   Resp: 18 (!) 21 20 20   Temp: 98.3 F (36.8 C) 98.5 F (36.9 C)    TempSrc: Oral Oral    SpO2: 94% 96% 97%   Weight: 50.3 kg (111 lb)     Height: 5' 0.5" (1.537 m)       Intake/Output Summary (Last 24 hours) at 02/01/17 1231 Last data filed at 02/01/17 0604  Gross per 24 hour  Intake              540 ml  Output              250 ml  Net              290 ml   Filed Weights   01/31/17 0432 02/01/17 0421 02/01/17 0825  Weight: 49.9 kg (110 lb) 50.8 kg (111 lb 14.4 oz) 50.3 kg (111 lb)  Exam  General: Well developed, chronically ill appearing, elderly, NAD  HEENT: NCAT, mucous membranes moist.   Cardiovascular: S1 S2 auscultated, RRR, no murmurs  Respiratory: Diminished breath sounds, no wheezing. Occ dry cough  Abdomen: Soft, nontender, nondistended, + bowel sounds  Extremities: warm dry without cyanosis clubbing or edema  Neuro: AAOx3, nonfocal  Psych: Normal affect and demeanor, pleasant  Data Reviewed: I have personally reviewed following labs and imaging studies  CBC:  Recent Labs Lab 01/27/17 1014 01/28/17 0549 01/29/17 0508 01/31/17 0540 02/01/17 0302  WBC 13.2* 18.4* 26.8* 19.2* 18.3*  HGB 8.7* 9.3* 9.0* 8.4* 8.2*  HCT 28.5* 29.8* 29.0* 26.5* 26.7*  MCV 81.0 81.9 81.5 81.3 80.7  PLT 403* 449* 443* 450* 762*   Basic Metabolic Panel:  Recent Labs Lab 01/27/17 1014 01/27/17 1910  01/28/17 0549 01/28/17 1805 01/29/17 0508 01/30/17 0152 02/01/17 0302  NA 136  --  138  --  134* 135 135  K 3.2*  --  4.2  --  5.1 4.6 4.4  CL 100*  --  105  --  100* 101 100*  CO2 26  --  25  --  26 27 27   GLUCOSE 151*  --  151*  --  186* 159* 146*  BUN 16  --  33*  --  33* 26* 18  CREATININE 0.97  --  1.00  --  1.02* 0.98 1.01*  CALCIUM 8.7*  --  9.4  --  9.1 8.7* 8.6*  MG 2.4 2.5* 2.4 2.2  --   --   --   PHOS 3.5  3.5 2.8 3.2 2.1*  --   --   --    GFR: Estimated Creatinine Clearance: 37.1 mL/min (A) (by C-G formula based on SCr of 1.01 mg/dL (H)). Liver Function Tests: No results for input(s): AST, ALT, ALKPHOS, BILITOT, PROT, ALBUMIN in the last 168 hours. No results for input(s): LIPASE, AMYLASE in the last 168 hours. No results for input(s): AMMONIA in the last 168 hours. Coagulation Profile:  Recent Labs Lab 01/27/17 0304  INR 1.12   Cardiac Enzymes:  Recent Labs Lab 01/27/17 0023 01/27/17 0559 01/27/17 1014  TROPONINI 0.05* 0.04* 0.04*   BNP (last 3 results) No results for input(s): PROBNP in the last 8760 hours. HbA1C: No results for input(s): HGBA1C in the last 72 hours. CBG:  Recent Labs Lab 01/31/17 1132 01/31/17 1655 01/31/17 2033 02/01/17 0611 02/01/17 1138  GLUCAP 112* 200* 205* 109* 189*   Lipid Profile: No results for input(s): CHOL, HDL, LDLCALC, TRIG, CHOLHDL, LDLDIRECT in the last 72 hours. Thyroid Function Tests: No results for input(s): TSH, T4TOTAL, FREET4, T3FREE, THYROIDAB in the last 72 hours. Anemia Panel:  Recent Labs  01/31/17 0744  VITAMINB12 1,399*  FOLATE 13.2  FERRITIN 108  TIBC 340  IRON 40  RETICCTPCT 1.2   Urine analysis:    Component Value Date/Time   COLORURINE YELLOW 02/03/2016 1309   APPEARANCEUR CLEAR 02/03/2016 1309   LABSPEC 1.011 02/03/2016 1309   PHURINE 5.5 02/03/2016 1309   GLUCOSEU NEGATIVE 02/03/2016 1309   HGBUR LARGE (A) 02/03/2016 1309   BILIRUBINUR NEGATIVE 02/03/2016 1309   KETONESUR  NEGATIVE 02/03/2016 1309   PROTEINUR NEGATIVE 02/03/2016 1309   UROBILINOGEN 0.2 05/25/2015 1400   NITRITE NEGATIVE 02/03/2016 1309   LEUKOCYTESUR NEGATIVE 02/03/2016 1309   Sepsis Labs: @LABRCNTIP (procalcitonin:4,lacticidven:4)  ) Recent Results (from the past 240 hour(s))  Blood culture (routine x 2)     Status: None   Collection Time: 01/26/17  8:58 PM  Result Value Ref Range Status   Specimen Description BLOOD RIGHT ARM  Final   Special Requests   Final    BOTTLES DRAWN AEROBIC AND ANAEROBIC Blood Culture adequate volume   Culture NO GROWTH 5 DAYS  Final   Report Status 01/31/2017 FINAL  Final  Blood culture (routine x 2)     Status: None   Collection Time: 01/26/17  9:07 PM  Result Value Ref Range Status   Specimen Description BLOOD RIGHT ARM  Final   Special Requests   Final    BOTTLES DRAWN AEROBIC AND ANAEROBIC Blood Culture adequate volume   Culture NO GROWTH 5 DAYS  Final   Report Status 01/31/2017 FINAL  Final  MRSA PCR Screening     Status: None   Collection Time: 01/27/17  3:25 AM  Result Value Ref Range Status   MRSA by PCR NEGATIVE NEGATIVE Final    Comment:        The GeneXpert MRSA Assay (FDA approved for NASAL specimens only), is one component of a comprehensive MRSA colonization surveillance program. It is not intended to diagnose MRSA infection nor to guide or monitor treatment for MRSA infections.   Culture, respiratory (NON-Expectorated)     Status: None   Collection Time: 01/27/17  4:32 AM  Result Value Ref Range Status   Specimen Description TRACHEAL ASPIRATE  Final   Special Requests NONE  Final   Gram Stain   Final    ABUNDANT WBC PRESENT, PREDOMINANTLY PMN RARE GRAM POSITIVE COCCI    Culture NO GROWTH 2 DAYS  Final   Report Status 01/29/2017 FINAL  Final  C difficile quick scan w PCR reflex     Status: Abnormal   Collection Time: 01/31/17  2:05 PM  Result Value Ref Range Status   C Diff antigen POSITIVE (A) NEGATIVE Final   C Diff  toxin NEGATIVE NEGATIVE Final   C Diff interpretation Results are indeterminate. See PCR results.  Final  Clostridium Difficile by PCR     Status: None   Collection Time: 01/31/17  2:05 PM  Result Value Ref Range Status   Toxigenic C Difficile by pcr NEGATIVE NEGATIVE Final    Comment: Patient is colonized with non toxigenic C. difficile. May not need treatment unless significant symptoms are present.      Radiology Studies: No results found.   Scheduled Meds: . atorvastatin  10 mg Oral QHS  . benzonatate  200 mg Oral TID  . chlorhexidine  15 mL Mouth Rinse BID  . dextromethorphan  30 mg Oral BID  . fluconazole  100 mg Oral Daily  . gabapentin  100 mg Oral QHS  . guaiFENesin  1,200 mg Oral BID  . irbesartan  150 mg Oral Daily   And  . hydrochlorothiazide  12.5 mg Oral Daily  . insulin aspart  0-9 Units Subcutaneous TID WC  . isosorbide mononitrate  30 mg Oral Daily  . mouth rinse  15 mL Mouth Rinse q12n4p  . mometasone-formoterol  2 puff Inhalation BID  . multivitamin with minerals  1 tablet Oral Daily  . [START ON 02/02/2017] pantoprazole  40 mg Oral Daily  . pentoxifylline  400 mg Oral Daily  . predniSONE  40 mg Oral Q breakfast  . rOPINIRole  2 mg Oral QHS  . tiotropium  18 mcg Inhalation Daily  . vancomycin  125 mg Oral Q6H  . verapamil  180 mg Oral Daily   Continuous Infusions: . cefTRIAXone (ROCEPHIN)  IV 1 g (02/01/17 1226)     LOS: 5 days   Time Spent in minutes   30 minutes  Deforest Maiden D.O. on 02/01/2017 at 12:31 PM  Between 7am to 7pm - Pager - [phone]  After 7pm go to [email or web address] - password [email or web address]  And look for the night coverage person covering for me after hours  [facility] Office  [phone]
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
## 2017-02-02 LAB — CBC
HEMATOCRIT: 27.2 % — AB (ref 36.0–46.0)
HEMOGLOBIN: 8.3 g/dL — AB (ref 12.0–15.0)
MCH: 25.1 pg — ABNORMAL LOW (ref 26.0–34.0)
MCHC: 30.5 g/dL (ref 30.0–36.0)
MCV: 82.2 fL (ref 78.0–100.0)
Platelets: 406 10*3/uL — ABNORMAL HIGH (ref 150–400)
RBC: 3.31 MIL/uL — AB (ref 3.87–5.11)
RDW: 14.5 % (ref 11.5–15.5)
WBC: 13.4 10*3/uL — AB (ref 4.0–10.5)

## 2017-02-02 LAB — GLUCOSE, CAPILLARY
GLUCOSE-CAPILLARY: 295 mg/dL — AB (ref 65–99)
Glucose-Capillary: 126 mg/dL — ABNORMAL HIGH (ref 65–99)

## 2017-02-02 MED ORDER — FLUCONAZOLE 100 MG PO TABS: 100.0000 mg | ORAL_TABLET | Freq: Every day | ORAL | 0 refills | Status: AC

## 2017-02-02 MED ORDER — PREDNISONE 10 MG PO TABS: 10.0000 mg | ORAL_TABLET | ORAL | 0 refills | Status: DC

## 2017-02-02 MED ORDER — DOXYCYCLINE HYCLATE 100 MG PO TABS: 100.0000 mg | ORAL_TABLET | Freq: Two times a day (BID) | ORAL | 0 refills | Status: AC

## 2017-02-02 MED ORDER — TIOTROPIUM BROMIDE MONOHYDRATE 18 MCG IN CAPS: 18.0000 ug | ORAL_CAPSULE | Freq: Every day | RESPIRATORY_TRACT | 0 refills | Status: DC

## 2017-02-02 NOTE — Care Management Note (Signed)
Case Management Note [name] RN, BSN Unit 2W-Case Manager [phone]  Patient Details  Name: Kimberly Downs MRN: [id number] Date of Birth: June 19, 1946  Subjective/Objective:    Pt admitted acute resp. Failure- COPD- intubated on admission - now extubated                Action/Plan:  PTA pt lived at home- independent- per PT eval recommendations for HHPT, orders have been placed for [facility] and DME- per pt she does not feel like she needs HH services and declines referral/services for discharge she also states that she already has nebulizer and does not want a 3n1. No CM needs for discharge- pt plans to return home.   Expected Discharge Date:     02/02/17             Expected Discharge Plan:  [place]  In-House Referral:     Discharge planning Services  CM Consult  Post Acute Care Choice:  Durable Medical Equipment, Home Health Choice offered to:  Patient  DME Arranged:  3-N-1, Nebulizer machine, Patient refused services DME Agency:  NA  HH Arranged:  PT, Patient Refused [place] Agency:  NA  Status of Service:  Completed, signed off  If discussed at [place] of Stay Meetings, dates discussed:  6/5  Discharge Disposition: home/self care   Additional Comments:  Dawayne Patricia, RN 02/02/2017, 10:10 AM

## 2017-02-02 NOTE — Discharge Summary (Signed)
Physician Discharge Summary  Kimberly Downs FUX:[id number] DOB: 16-Jan-1946 DOA: 01/26/2017  PCP: Kimberly Shelter, PA-C  Admit date: 01/26/2017 Discharge date: 02/02/2017  Time spent: > 35 minutes  Recommendations for Outpatient Follow-up:  1. Ensure patient follows up with gastroenterology (Dr. Therisa Doyne) for cytology results 2. Also aspirin and Plavix are held on discharge secondary to blood loss anemia most likely from ulcers. Please discuss with gastroenterologist as to when these can be continued 3. Monitor blood sugars and adjust hypoglycemic agents accordingly 4. Reassess CBC on follow-up 5. Discharge on prednisone taper please ensure patient follows up with pulmonologist to see if they want to maintain patient on 10 mg by mouth daily after taper completed   Discharge Diagnoses:  Principal Problem:   Acute respiratory failure with hypoxia ([place]) Active Problems:   DM (diabetes mellitus), type 2 with renal complications ([place])   Essential hypertension   Sepsis ([place])   Community acquired pneumonia   Atrial fibrillation with RVR ([place])   Gastrointestinal hemorrhage   Discharge Condition: stable  Diet recommendation: diabetic diet  Filed Weights   02/01/17 0421 02/01/17 0825 02/02/17 0300  Weight: 50.8 kg (111 lb 14.4 oz) 50.3 kg (111 lb) 51.4 kg (113 lb 6.4 oz)    History of present illness:  71 y.o.femalewith medical history significant of ASCVD s/p stent placement and abdominal aorta angiogram with 50% stenosis per patient report with h/o DM, HTN, HLD, and COPD not on home O2 presenting with SOB. She reports that since Saturday, she has been very sick to her stomach, coughing up so much. +SOB. Sputum productive of light green to dark green sputum. +chills, no fevers. No sick contacts. Unable to provide additional history due to marked SOB/tachypnea while on BIPAP.  Interim history Patient admitted for bronchitis and bronchopneumonia. Developed A. fib RVR on the emergency  department. Required Cardizem drip and was subsequently intubated. She was extubated on 01/28/2017. [facility] assumed care on 01/30/2017. Patient now having dark melanotic stools. GI consulted s/p EGD  Hospital Course:  Acute on chronic respiratory failure with hypoxemia/COPD exacerbation - Patient did require intubation however was successfully extubated on 01/28/2017 - Pulmonology followed in house - d/c on doxycycline - d/c on spiriva (script provided on d/c) - d/c on prednisone taper  Normocytic Anemia / Possible GI bleed/ Melena/ Symptomatic Anemia -Baseline hemoglobin between 9-11 (reviewing records in EP[facility]) -Patient was placed on full dose lovenox for transient Afib on admission, however this was discontinued. She was also on plavix and aspirin, which have both been held as well. -hemoglobin currently 8.2 -FOBT positive -obtained Anemia panel WNL, B12: 1399 -Gastroenterology consulted  -Last colonoscopy Sept 2016 showed left sided diverticula, end-to-side anastamosis at 10cm above dentate line. Biposy results were unremarkable. (colonscopy done at that time for rectal bleeding and diarrhea) -S/p EGD: monilial esophagitis (sample sent for cytology), nonbleeding gastric ulcers with clean ulcer base, non-bleeding erosive duodenum. Biopsy contraindicated.  -Recommendations: protonix 40mg  daily x 4 weeks  Esophageal candidiasis  -Started on fluconazole 100mg  daily for 4 weeks  Diarrhea -resolved no reports of diarrhea on d/c.   Transient atrial fibrillation with RVR/mildly elevated troponin/history of CAD - Likely secondary to respiratory status - Patient did require Cardizem drip however weaned off successfully and currently in sinus rhythm - Was placed on Lovenox full dose - CHADSVASC 3 (given gender, age, HTN) - Will discuss continued anticoagulation with cardiology - Suspect mild elevation in troponin, 0.05, 0.04 likely due to imminent ischemia from atrial fibrillation - No  complaints  of chest pain - Pt is just noticing stool normalizing. Will hold aspirin and plavix until cleared by GI specialist.  Leukocytosis -WBC trending downward, currently 13.4 -Most likely exacerbated due to steroids  Acute encephalopathy -Likely metabolic, has resolved  Anxiety -Continue Xanax  Essential hypertension -Continue verapamil  Chronic kidney disease, stage III -Creatinine appears to be stable, continue to monitor BMP  Chronic systolic/diastolic heart failure -Patient appears to be euvolemic -Echocardiogram showed EF of 54-00%, grade 1 diastolic dysfunction -Continue monitor intake and output, daily weights  Deconditioning -PT consulted and recommended home health PT and 3 in1 -likely secondary to COPD exac, made worse by continued cough   Procedures:  none  Consultations:  Gastroenterology  Pulmonology  Discharge Exam: Vitals:   02/01/17 2036 02/02/17 0500  BP:  (!) 143/78  Pulse: 68 68  Resp: 18 18  Temp:  97.9 F (36.6 C)    General: Pt in nad, alert and awake Cardiovascular: rrr, no rubs Respiratory: no increased wob, no wheezes, prolonged expiratory phase  Discharge Instructions   Discharge Instructions    Call MD for:  difficulty breathing, headache or visual disturbances    Complete by:  As directed    Call MD for:  extreme fatigue    Complete by:  As directed    Call MD for:  severe uncontrolled pain    Complete by:  As directed    Call MD for:  temperature >100.4    Complete by:  As directed    Diet - low sodium heart healthy    Complete by:  As directed    Discharge instructions    Complete by:  As directed    Please follow-up with your primary care physician and pulmonologist after hospital discharge. Also follow-up with the gastroenterologist for the results of the cytology obtained during your testing in the hospital.   Increase activity slowly    Complete by:  As directed      Current Discharge Medication  List    START taking these medications   Details  doxycycline (VIBRA-TABS) 100 MG tablet Take 1 tablet (100 mg total) by mouth 2 (two) times daily. Qty: 8 tablet, Refills: 0    fluconazole (DIFLUCAN) 100 MG tablet Take 1 tablet (100 mg total) by mouth daily. Qty: 25 tablet, Refills: 0    tiotropium (SPIRIVA) 18 MCG inhalation capsule Place 1 capsule (18 mcg total) into inhaler and inhale daily. Qty: 30 capsule, Refills: 0      CONTINUE these medications which have CHANGED   Details  predniSONE (DELTASONE) 10 MG tablet Take 1 tablet (10 mg total) by mouth as directed. Take 15 mg by mouth for the next 3 days, then take 40 mg by mouth for the next 3 days, then take 30 mg by mouth for the next 3 days, then take 20 mg by mouth for the next 3 days then take 10 mg by mouth daily. Please follow-up with your pulmonologist to see if you should continue on 10 mg daily Qty: 40 tablet, Refills: 0      CONTINUE these medications which have NOT CHANGED   Details  ALPRAZolam (XANAX) 0.5 MG tablet Take 0.5 mg by mouth 2 (two) times daily.     atorvastatin (LIPITOR) 10 MG tablet Take 10 mg by mouth at bedtime.     benzonatate (TESSALON) 200 MG capsule Take 200 mg by mouth 2 (two) times daily.    budesonide-formoterol (SYMBICORT) 160-4.5 MCG/ACT inhaler Inhale 2 puffs into the lungs  2 (two) times daily.    gabapentin (NEURONTIN) 100 MG capsule Take 100 mg by mouth See admin instructions. Take 100 mg by mouth at bedtime. If no improvement may take 100 mg every 8-12 hours Refills: 0    irbesartan-hydrochlorothiazide (AVALIDE) 150-12.5 MG tablet Take 1 tablet by mouth daily. Refills: 4    isosorbide mononitrate (IMDUR) 30 MG 24 hr tablet Take 1 tablet (30 mg total) by mouth daily. Qty: 30 tablet, Refills: 6    pantoprazole (PROTONIX) 40 MG tablet Take 40 mg by mouth daily as needed (for acid reflux).    pentoxifylline (TRENTAL) 400 MG CR tablet Take 400 mg by mouth daily.    rOPINIRole (REQUIP)  2 MG tablet Take 2 mg by mouth at bedtime.    traMADol (ULTRAM) 50 MG tablet Take 50-100 mg by mouth every 6 (six) hours as needed (for headaches).    cyanocobalamin ([employer] VITAMIN B12) 2000 MCG tablet Take 1 tablet (2,000 mcg total) by mouth daily. Qty: 120 tablet, Refills: 0    ipratropium-albuterol (DUONEB) 0.5-2.5 (3) MG/3ML SOLN Take 3 mLs by nebulization 3 (three) times daily. Qty: 360 mL, Refills: 11    sitaGLIPtin (JANUVIA) 100 MG tablet Take 100 mg by mouth daily.    verapamil (VERELAN PM) 180 MG 24 hr capsule Take 180 mg by mouth at bedtime.      STOP taking these medications     clopidogrel (PLAVIX) 75 MG tablet      aspirin EC 81 MG tablet      RABEprazole (ACIPHEX) 20 MG tablet        Allergies  Allergen Reactions  . Dicyclomine Other (See Comments)    Reaction:  Agitation   . Morphine And Related Nausea And Vomiting  . Nsaids Nausea And Vomiting  . Toradol [Ketorolac Tromethamine] Nausea And Vomiting  . Levaquin [Levofloxacin] Nausea And Vomiting and Rash  . Metronidazole Nausea And Vomiting and Rash      The results of significant diagnostics from this hospitalization (including imaging, microbiology, ancillary and laboratory) are listed below for reference.    Significant Diagnostic Studies: Dg Chest 2 View  Result Date: 01/26/2017 CLINICAL DATA:  71 y/o  F; shortness of breath. EXAM: CHEST  2 VIEW COMPARISON:  01/10/2017 chest radiograph FINDINGS: Normal cardiac silhouette. Aortic atherosclerosis with calcification. Stable emphysema and hyperinflation of lungs. Reticular and nodular opacities in the lung bases. Blunting of costal diaphragmatic angles, possible trace effusions. Chronic rib fractures. Bones are demineralized. No acute osseous abnormality is evident. IMPRESSION: 1. Reticulonodular opacities in the lung bases probably represents developing acute bronchitis/bronchopneumonia. 2. Aortic atherosclerosis. 3. COPD. Electronically Signed   By: Kristine Garbe M.D.   On: 01/26/2017 19:50   Ct Abdomen Pelvis W Contrast  Result Date: 01/12/2017 CLINICAL DATA:  Periumbilical abdominal pain. EXAM: CT ABDOMEN AND PELVIS WITH CONTRAST TECHNIQUE: Multidetector CT imaging of the abdomen and pelvis was performed using the standard protocol following bolus administration of intravenous contrast. CONTRAST:  171mL ISOVUE-300 IOPAMIDOL (ISOVUE-300) INJECTION 61% COMPARISON:  CT scan 02/03/2016 FINDINGS: Lower chest: The lung bases are clear of acute process. Minimal scarring changes and atelectasis. The heart is normal in size. Coronary artery calcifications are noted. Small to moderate-sized hiatal hernia. Hepatobiliary: Right and left hepatic lobe liver lesions are consistent with benign hemangiomas. No new or worrisome lesions. Status post cholecystectomy with mild stable common bile duct dilatation. Pancreas: No mass, inflammation or ductal dilatation. Spleen: Normal size.  No focal lesions. Adrenals/Urinary Tract: The adrenal  glands and kidneys are unremarkable and stable. No ureteral or bladder calculi or bladder mass. Stomach/Bowel: The stomach, duodenum, small bowel and colon are unremarkable. No inflammatory changes, mass lesions or obstructive findings. Remote surgical changes involving the rectum. Low lying cecum deep in the pelvis. Normal terminal ileum. Vascular/Lymphatic: Severe atherosclerotic calcifications involving the aorta, iliac arteries and branch vessels but no focal aneurysm or dissection. The major venous structures are patent. Small scattered mesenteric and retroperitoneal lymph nodes but no mass or adenopathy. Reproductive: Surgically absent. Other: No pelvic mass or adenopathy. No free pelvic fluid collections. No inguinal mass or adenopathy. No abdominal wall hernia or subcutaneous lesions. Musculoskeletal: No significant bony findings.  Osteoporosis. IMPRESSION: 1. No acute abdominal/pelvic findings, mass lesions or lymphadenopathy.  2. Multiple liver lesions consistent with benign hepatic hemangiomas. 3. Status post cholecystectomy with stable common bile duct dilatation. 4. Advanced atherosclerotic calcifications involving the aorta and branch vessels Electronically Signed   By: Marijo Sanes M.D.   On: 01/12/2017 16:34   Dg Chest Port 1 View  Result Date: 01/28/2017 CLINICAL DATA:  Intubation, respiratory failure, ventilator dependent, hypertension, diabetes mellitus, COPD, coronary artery disease EXAM: PORTABLE CHEST 1 VIEW COMPARISON:  Portable exam 0545 hours compared to 01/27/2017 FINDINGS: Tip of endotracheal tube at carina recommend withdrawal 1.5 to 2.0 cm. Normal heart size and pulmonary vascularity. Atherosclerotic calcification aorta. Interstitial prominence in both lungs again identified, unchanged. No new areas of segmental consolidation, pleural effusion or pneumothorax. Bones demineralized. IMPRESSION: Persistent mild interstitial infiltrates. Aortic atherosclerosis. Tip of endotracheal tube is at the carina, recommend withdrawal 1.5 to 2.0 cm Findings called to [facility] on [facility] on 01/28/2017 at 0806 hours. Electronically Signed   By: Lavonia Dana M.D.   On: 01/28/2017 08:07   Dg Chest Port 1 View  Result Date: 01/27/2017 CLINICAL DATA:  Check endotracheal tube placement EXAM: PORTABLE CHEST 1 VIEW COMPARISON:  01/27/2017 FINDINGS: Endotracheal tube and nasogastric catheter are noted. The endotracheal tube is noted just above the carina improved in positioning when compare with the prior exam. Cardiac shadow is stable. Stable interstitial changes are noted bilaterally. No new focal infiltrate or effusion is seen. No bony abnormality is noted. IMPRESSION: Stable bilateral interstitial changes. Improved endotracheal tube positioning Electronically Signed   By: Inez Catalina M.D.   On: 01/27/2017 08:00   Portable Chest Xray  Result Date: 01/27/2017 CLINICAL DATA:  Endotracheal tube placement.  Initial encounter. EXAM:  PORTABLE CHEST 1 VIEW COMPARISON:  Chest radiograph performed 01/26/2017 FINDINGS: The patient's endotracheal tube is seen ending 1-2 cm below the carina, at the right mainstem bronchus. This should be retracted 4-5 cm. The enteric tube is seen extending below the diaphragm. Vascular congestion is noted. Increased interstitial markings raise concern for mild interstitial edema. No pleural effusion or pneumothorax is seen The cardiomediastinal silhouette is borderline enlarged. No acute osseous abnormalities are identified. IMPRESSION: 1. Endotracheal tube seen ending 1-2 cm below the carina, at the right mainstem bronchus. This should be retracted 4-5 cm. 2. Vascular congestion and borderline cardiomegaly. Increased interstitial markings raise concern for mild interstitial edema. These results were called by telephone at the time of interpretation on 01/27/2017 at 2:16 am to Dr. Wyvonnia Dusky, who verbally acknowledged these results. Electronically Signed   By: Garald Balding M.D.   On: 01/27/2017 02:17    Microbiology: Recent Results (from the past 240 hour(s))  Blood culture (routine x 2)     Status: None   Collection Time: 01/26/17  8:58 PM  Result Value Ref Range Status   Specimen Description BLOOD RIGHT ARM  Final   Special Requests   Final    BOTTLES DRAWN AEROBIC AND ANAEROBIC Blood Culture adequate volume   Culture NO GROWTH 5 DAYS  Final   Report Status 01/31/2017 FINAL  Final  Blood culture (routine x 2)     Status: None   Collection Time: 01/26/17  9:07 PM  Result Value Ref Range Status   Specimen Description BLOOD RIGHT ARM  Final   Special Requests   Final    BOTTLES DRAWN AEROBIC AND ANAEROBIC Blood Culture adequate volume   Culture NO GROWTH 5 DAYS  Final   Report Status 01/31/2017 FINAL  Final  MRSA PCR Screening     Status: None   Collection Time: 01/27/17  3:25 AM  Result Value Ref Range Status   MRSA by PCR NEGATIVE NEGATIVE Final    Comment:        The GeneXpert MRSA Assay  (FDA approved for NASAL specimens only), is one component of a comprehensive MRSA colonization surveillance program. It is not intended to diagnose MRSA infection nor to guide or monitor treatment for MRSA infections.   Culture, respiratory (NON-Expectorated)     Status: None   Collection Time: 01/27/17  4:32 AM  Result Value Ref Range Status   Specimen Description TRACHEAL ASPIRATE  Final   Special Requests NONE  Final   Gram Stain   Final    ABUNDANT WBC PRESENT, PREDOMINANTLY PMN RARE GRAM POSITIVE COCCI    Culture NO GROWTH 2 DAYS  Final   Report Status 01/29/2017 FINAL  Final  C difficile quick scan w PCR reflex     Status: Abnormal   Collection Time: 01/31/17  2:05 PM  Result Value Ref Range Status   C Diff antigen POSITIVE (A) NEGATIVE Final   C Diff toxin NEGATIVE NEGATIVE Final   C Diff interpretation Results are indeterminate. See PCR results.  Final  Clostridium Difficile by PCR     Status: None   Collection Time: 01/31/17  2:05 PM  Result Value Ref Range Status   Toxigenic C Difficile by pcr NEGATIVE NEGATIVE Final    Comment: Patient is colonized with non toxigenic C. difficile. May not need treatment unless significant symptoms are present.     Labs: Basic Metabolic Panel:  Recent Labs Lab 01/27/17 1014 01/27/17 1910 01/28/17 0549 01/28/17 1805 01/29/17 0508 01/30/17 0152 02/01/17 0302  NA 136  --  138  --  134* 135 135  K 3.2*  --  4.2  --  5.1 4.6 4.4  CL 100*  --  105  --  100* 101 100*  CO2 26  --  25  --  26 27 27   GLUCOSE 151*  --  151*  --  186* 159* 146*  BUN 16  --  33*  --  33* 26* 18  CREATININE 0.97  --  1.00  --  1.02* 0.98 1.01*  CALCIUM 8.7*  --  9.4  --  9.1 8.7* 8.6*  MG 2.4 2.5* 2.4 2.2  --   --   --   PHOS 3.5  3.5 2.8 3.2 2.1*  --   --   --    Liver Function Tests: No results for input(s): AST, ALT, ALKPHOS, BILITOT, PROT, ALBUMIN in the last 168 hours. No results for input(s): LIPASE, AMYLASE in the last 168 hours. No  results for input(s): AMMONIA in the last 168 hours. CBC:  Recent  Labs Lab 01/28/17 0549 01/29/17 0508 01/31/17 0540 02/01/17 0302 02/02/17 0229  WBC 18.4* 26.8* 19.2* 18.3* 13.4*  HGB 9.3* 9.0* 8.4* 8.2* 8.3*  HCT 29.8* 29.0* 26.5* 26.7* 27.2*  MCV 81.9 81.5 81.3 80.7 82.2  PLT 449* 443* 450* 423* 406*   Cardiac Enzymes:  Recent Labs Lab 01/27/17 0023 01/27/17 0559 01/27/17 1014  TROPONINI 0.05* 0.04* 0.04*   BNP: BNP (last 3 results) No results for input(s): BNP in the last 8760 hours.  ProBNP (last 3 results) No results for input(s): PROBNP in the last 8760 hours.  CBG:  Recent Labs Lab 02/01/17 1138 02/01/17 1613 02/01/17 2118 02/02/17 0603 02/02/17 1113  GLUCAP 189* 275* 140* 126* 295*    Signed:  Velvet Bathe MD.  [facility] 02/02/2017, 12:25 PM

## 2017-02-02 NOTE — Progress Notes (Signed)
Discussed with the patient and all questioned fully answered. She will call me if any problems arise.  IV removed. Telemetry removed. Pt given discharge packet   Fritz Pickerel, RN

## 2017-02-06 ENCOUNTER — Other Ambulatory Visit: Payer: Self-pay | Admitting: Physician Assistant

## 2017-02-07 NOTE — Telephone Encounter (Signed)
Rx(s) sent to pharmacy electronically.  

## 2017-02-07 NOTE — Telephone Encounter (Signed)
Please review for refill. Thanks!  

## 2017-02-09 ENCOUNTER — Encounter: Payer: Self-pay | Admitting: Pulmonary Disease

## 2017-02-09 ENCOUNTER — Other Ambulatory Visit (INDEPENDENT_AMBULATORY_CARE_PROVIDER_SITE_OTHER): Payer: Medicare Other

## 2017-02-09 ENCOUNTER — Ambulatory Visit (INDEPENDENT_AMBULATORY_CARE_PROVIDER_SITE_OTHER): Payer: Medicare Other | Admitting: Acute Care

## 2017-02-09 ENCOUNTER — Ambulatory Visit (INDEPENDENT_AMBULATORY_CARE_PROVIDER_SITE_OTHER)
Admission: RE | Admit: 2017-02-09 | Discharge: 2017-02-09 | Disposition: A | Discharge status: Home / Self Care | Payer: Medicare Other | Source: Ambulatory Visit | Attending: Pulmonary Disease | Admitting: Pulmonary Disease

## 2017-02-09 VITALS — BP 120/78 | HR 86 | Ht 60.0 in | Wt 107.6 lb

## 2017-02-09 DIAGNOSIS — R42 Dizziness and giddiness: Secondary | ICD-10-CM

## 2017-02-09 DIAGNOSIS — J449 Chronic obstructive pulmonary disease, unspecified: Secondary | ICD-10-CM | POA: Diagnosis not present

## 2017-02-09 DIAGNOSIS — R296 Repeated falls: Secondary | ICD-10-CM

## 2017-02-09 DIAGNOSIS — J441 Chronic obstructive pulmonary disease with (acute) exacerbation: Secondary | ICD-10-CM | POA: Diagnosis not present

## 2017-02-09 DIAGNOSIS — D649 Anemia, unspecified: Secondary | ICD-10-CM

## 2017-02-09 DIAGNOSIS — F32A Depression, unspecified: Secondary | ICD-10-CM

## 2017-02-09 DIAGNOSIS — Z720 Tobacco use: Secondary | ICD-10-CM

## 2017-02-09 DIAGNOSIS — F329 Major depressive disorder, single episode, unspecified: Secondary | ICD-10-CM | POA: Diagnosis not present

## 2017-02-09 LAB — CBC WITH DIFFERENTIAL/PLATELET
BASOS ABS: 0 10*3/uL (ref 0.0–0.1)
BASOS PCT: 0.3 % (ref 0.0–3.0)
EOS ABS: 0 10*3/uL (ref 0.0–0.7)
Eosinophils Relative: 0 % (ref 0.0–5.0)
HEMATOCRIT: 31.1 % — AB (ref 36.0–46.0)
Hemoglobin: 9.9 g/dL — ABNORMAL LOW (ref 12.0–15.0)
LYMPHS ABS: 0.7 10*3/uL (ref 0.7–4.0)
LYMPHS PCT: 4.1 % — AB (ref 12.0–46.0)
MCHC: 31.9 g/dL (ref 30.0–36.0)
MCV: 79.8 fl (ref 78.0–100.0)
MONO ABS: 0.4 10*3/uL (ref 0.1–1.0)
Monocytes Relative: 2.5 % — ABNORMAL LOW (ref 3.0–12.0)
NEUTROS ABS: 15.4 10*3/uL — AB (ref 1.4–7.7)
NEUTROS PCT: 93.1 % — AB (ref 43.0–77.0)
PLATELETS: 665 10*3/uL — AB (ref 150.0–400.0)
RBC: 3.9 Mil/uL (ref 3.87–5.11)
RDW: 16.5 % — AB (ref 11.5–15.5)
WBC: 16.6 10*3/uL — ABNORMAL HIGH (ref 4.0–10.5)

## 2017-02-09 MED ORDER — IPRATROPIUM-ALBUTEROL 0.5-2.5 (3) MG/3ML IN SOLN: 3.0000 mL | Freq: Three times a day (TID) | RESPIRATORY_TRACT | 5 refills | Status: DC

## 2017-02-09 NOTE — Assessment & Plan Note (Signed)
Patient continues to be smoke free since May 2018. Congratulated her on maintaining smoking cessation Plan Counseled extensively on maintaining her smoking cessation. Offered support as needed
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
## 2017-02-09 NOTE — Patient Instructions (Addendum)
It is good to meet you today.  CXR today CBC today Please arrange for patient to be seen by Dr. Aggie Hacker at [facility] in [place] ( Son is arranging this as he is a friend) We will call you with results We will send in a prescription for the DuoNeb treatments. Please remove DNR from patient record. Please start weaning yourself off your xanax. Do not stop taking the medication as you can have withdrawal. Try to use just  0.25 mg twice daily until you see Dr. Truman Hayward.  Have Dr. Truman Hayward advise you on how to wean this medication. Please call Dr. Truman Hayward and have her help you wean off your Xanax. Referral to phychiatry for post traumatic stress. Neuro consult for dizziness. Continue your prednisone taper until gone, then resume your 10 mg daily. Continue taking Symbicort 2 puffs twice daily. Add Duonebs twice daily Rinse mouth after use. Follow up with Dr. Lake Bells in 3 months with PFT's prior Please contact office for sooner follow up if symptoms do not improve or worsen or seek emergency care

## 2017-02-09 NOTE — Assessment & Plan Note (Addendum)
Patient states she has been feeling dizzy despite adequate blood pressure Family have noticed increased use of Xanax Plan CBC today Please start weaning yourself off your xanax. Do not stop taking the medication as you can have withdrawal. Try to use just  0.25 mg twice daily until you see Dr. Truman Hayward.  Have Dr. Truman Hayward advise you on how to wean this medication. Please call Dr. Truman Hayward and have her help you wean off your Xanax. Neuro consult for dizziness. Follow-up with Dr. Lake Bells in 3 months with repeat PFTs prior

## 2017-02-09 NOTE — Assessment & Plan Note (Signed)
Continued resolution of exacerbation No respiratory complaints Compliant with medications Plan: CXR today We will call you with results We will send in a prescription for the DuoNeb treatments. Please remove DNR from patient record. Continue your prednisone taper until gone, then resume your 10 mg daily. Continue taking Symbicort 2 puffs twice daily. Add Duonebs twice daily Rinse mouth after use. Follow up with Dr. Lake Bells in 3 months with PFT's prior Please contact office for sooner follow up if symptoms do not improve or worsen or seek emergency care

## 2017-02-09 NOTE — Assessment & Plan Note (Signed)
Patient states that she has had a very hard 2 years She states she keeps hearing her son's voice in her head asking the doctor to save her life Family have noticed increased use of Xanax Plan Psych consult Follow up with Dr. Lake Bells in 3 months with PFT's prior Please contact office for sooner follow up if symptoms do not improve or worsen or seek emergency care

## 2017-02-09 NOTE — Assessment & Plan Note (Signed)
Patient states she has noticed her stools are dark again EGD 02/01/2017 indicated patchy candidiasis, and few nonbleeding superficial gastric ulcers with clear bases Plan CBC today Son has personal relationship with Dr. Darlen Round , gastroenterologist in [place]. Son is contacting office for consultation to evaluate etiology of dark stools

## 2017-02-10 ENCOUNTER — Telehealth: Payer: Self-pay | Admitting: Acute Care

## 2017-02-10 NOTE — Progress Notes (Signed)
Reviewed, agree 

## 2017-02-10 NOTE — Telephone Encounter (Signed)
I did not ever mention doxycycline for a month. Patients CXR was clear.She has a history of C-Diff. Please call her and let her know she does not need additional antibiotic at this time. Thanks so much.

## 2017-02-10 NOTE — Telephone Encounter (Signed)
[email or web address] x1 

## 2017-02-10 NOTE — Telephone Encounter (Signed)
Spoke with the pt  She states that Judson Roch advised her to take her Doxy for a whole month  She is asking that we send more in for her, however I do not see any mention of this in the note  Please advise thanks!

## 2017-02-10 NOTE — Telephone Encounter (Signed)
Spoke with pt and informed her of SG's message. Pt understood and had no further questions at this time. Nothing further is needed

## 2017-02-10 NOTE — Telephone Encounter (Signed)
Pt returning call and can be reached @ [phone].Kimberly Downs

## 2017-02-14 DIAGNOSIS — R51 Headache: Secondary | ICD-10-CM | POA: Diagnosis not present

## 2017-02-14 DIAGNOSIS — R42 Dizziness and giddiness: Secondary | ICD-10-CM | POA: Diagnosis not present

## 2017-02-14 DIAGNOSIS — F419 Anxiety disorder, unspecified: Secondary | ICD-10-CM | POA: Diagnosis not present

## 2017-02-14 DIAGNOSIS — E1140 Type 2 diabetes mellitus with diabetic neuropathy, unspecified: Secondary | ICD-10-CM | POA: Diagnosis not present

## 2017-02-14 DIAGNOSIS — E538 Deficiency of other specified B group vitamins: Secondary | ICD-10-CM | POA: Diagnosis not present

## 2017-02-14 DIAGNOSIS — Z7984 Long term (current) use of oral hypoglycemic drugs: Secondary | ICD-10-CM | POA: Diagnosis not present

## 2017-02-14 DIAGNOSIS — Z0000 Encounter for general adult medical examination without abnormal findings: Secondary | ICD-10-CM | POA: Diagnosis not present

## 2017-02-14 DIAGNOSIS — I119 Hypertensive heart disease without heart failure: Secondary | ICD-10-CM | POA: Diagnosis not present

## 2017-02-14 DIAGNOSIS — M353 Polymyalgia rheumatica: Secondary | ICD-10-CM | POA: Diagnosis not present

## 2017-02-14 DIAGNOSIS — Z09 Encounter for follow-up examination after completed treatment for conditions other than malignant neoplasm: Secondary | ICD-10-CM | POA: Diagnosis not present

## 2017-02-14 DIAGNOSIS — R0781 Pleurodynia: Secondary | ICD-10-CM | POA: Diagnosis not present

## 2017-02-14 DIAGNOSIS — D649 Anemia, unspecified: Secondary | ICD-10-CM | POA: Diagnosis not present

## 2017-02-18 DIAGNOSIS — K259 Gastric ulcer, unspecified as acute or chronic, without hemorrhage or perforation: Secondary | ICD-10-CM | POA: Diagnosis not present

## 2017-03-09 ENCOUNTER — Ambulatory Visit (INDEPENDENT_AMBULATORY_CARE_PROVIDER_SITE_OTHER): Payer: Medicare Other | Admitting: Neurology

## 2017-03-09 ENCOUNTER — Encounter: Payer: Self-pay | Admitting: Neurology

## 2017-03-09 VITALS — BP 130/75 | HR 85 | Ht 60.5 in | Wt 111.8 lb

## 2017-03-09 DIAGNOSIS — R2689 Other abnormalities of gait and mobility: Secondary | ICD-10-CM | POA: Diagnosis not present

## 2017-03-09 DIAGNOSIS — R42 Dizziness and giddiness: Secondary | ICD-10-CM

## 2017-03-09 DIAGNOSIS — W19XXXA Unspecified fall, initial encounter: Secondary | ICD-10-CM

## 2017-03-09 NOTE — Patient Instructions (Addendum)
Remember to drink plenty of fluid, eat healthy meals and do not skip any meals. Try to eat protein with a every meal and eat a healthy snack such as fruit or nuts in between meals. Try to keep a regular sleep-wake schedule and try to exercise daily, particularly in the form of walking, 20-30 minutes a day, if you can.   Physical Therapy  I would like to see you back as needed, sooner if we need to. Please call us with any interim questions, concerns, problems, updates or refill requests.   Our phone number is [phone]. We also have an after hours call service for urgent matters and there is a physician on-call for urgent questions. For any emergencies you know to call 911 or go to the nearest emergency room  Preventing Falls and Fractures  Falls can be very serious, especially for older adults or people with osteoporosis  Falls can be caused by:  Tripping or slipping  Slow reflexes  Balance problems  Reduced muscle strength  Poor vision or a recent change in prescription  Illness and some medications (especially blood pressure pills, diuretics, heart medicines, muscle relaxants and sleep medications)  Drinking alcohol  To prevent falls outdoors:  Use a can or walker if needed  Wear rubber-soled shoes so you don't slip  DO NOT buy "shape up" shoes with rocker bottom soles if you have balance problems.  The thick soles and shape make it more difficult to keep your balance.  Put kitty litter or salt on icy sidewalks  Walk on the grass if the sidewalks are slick  Avoid walking on uneven ground whenever possible  T prevent falls indoors:  Keep rooms clutter-free, especially hallways, stairs and paths to light switches  Remove throw rugs  Install night lights, especially to and in the bathroom  Turn on lights before going downstairs  Keep a flashlight next to your bed  Buy a cordless phone to keep with you instead of jumping up to answer the phone  Install grab  bars in the bathroom near the shower and toilet  Install rails on both sides of the stairs.  Make sure the stairs are well lit  Wear slippers with non-skid soles.  Do not walk around in stockings or socks  Balance problems and dizziness are not a normal part of growing older.  If you begin having balance problems or dizziness see your doctor.  Physical Therapy can help you with many balance problems, strengthening hip and leg muscles and with gait training.  To keep your bones healthy make sure you are getting enough calcium and Vitamin D each day.  Ask your doctor or pharmacist about supplements.  Regular weight-bearing exercise like walking, lifting weights or dancing can help strengthen bones and prevent osteoporosis.
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
## 2017-03-14 DIAGNOSIS — M7989 Other specified soft tissue disorders: Secondary | ICD-10-CM | POA: Diagnosis not present

## 2017-03-14 DIAGNOSIS — I2510 Atherosclerotic heart disease of native coronary artery without angina pectoris: Secondary | ICD-10-CM | POA: Diagnosis not present

## 2017-03-14 DIAGNOSIS — J449 Chronic obstructive pulmonary disease, unspecified: Secondary | ICD-10-CM | POA: Diagnosis not present

## 2017-03-14 DIAGNOSIS — R062 Wheezing: Secondary | ICD-10-CM | POA: Diagnosis not present

## 2017-03-14 DIAGNOSIS — R58 Hemorrhage, not elsewhere classified: Secondary | ICD-10-CM | POA: Diagnosis not present

## 2017-03-14 DIAGNOSIS — R0602 Shortness of breath: Secondary | ICD-10-CM | POA: Diagnosis not present

## 2017-03-15 ENCOUNTER — Emergency Department (HOSPITAL_COMMUNITY): Payer: Medicare Other

## 2017-03-15 ENCOUNTER — Inpatient Hospital Stay (HOSPITAL_COMMUNITY)
Admission: EM | Admit: 2017-03-15 | Discharge: 2017-03-22 | DRG: 291 | Disposition: A | Discharge status: Home Health | Payer: Medicare Other | Attending: Nephrology | Admitting: Nephrology

## 2017-03-15 ENCOUNTER — Encounter (HOSPITAL_COMMUNITY): Payer: Self-pay

## 2017-03-15 DIAGNOSIS — Z7901 Long term (current) use of anticoagulants: Secondary | ICD-10-CM

## 2017-03-15 DIAGNOSIS — I420 Dilated cardiomyopathy: Secondary | ICD-10-CM | POA: Diagnosis present

## 2017-03-15 DIAGNOSIS — D62 Acute posthemorrhagic anemia: Secondary | ICD-10-CM | POA: Diagnosis not present

## 2017-03-15 DIAGNOSIS — Z888 Allergy status to other drugs, medicaments and biological substances status: Secondary | ICD-10-CM

## 2017-03-15 DIAGNOSIS — R079 Chest pain, unspecified: Secondary | ICD-10-CM | POA: Diagnosis not present

## 2017-03-15 DIAGNOSIS — Z8249 Family history of ischemic heart disease and other diseases of the circulatory system: Secondary | ICD-10-CM | POA: Diagnosis not present

## 2017-03-15 DIAGNOSIS — I5042 Chronic combined systolic (congestive) and diastolic (congestive) heart failure: Secondary | ICD-10-CM | POA: Diagnosis present

## 2017-03-15 DIAGNOSIS — I5023 Acute on chronic systolic (congestive) heart failure: Secondary | ICD-10-CM | POA: Diagnosis not present

## 2017-03-15 DIAGNOSIS — Z7952 Long term (current) use of systemic steroids: Secondary | ICD-10-CM | POA: Diagnosis not present

## 2017-03-15 DIAGNOSIS — J441 Chronic obstructive pulmonary disease with (acute) exacerbation: Secondary | ICD-10-CM | POA: Diagnosis present

## 2017-03-15 DIAGNOSIS — I110 Hypertensive heart disease with heart failure: Principal | ICD-10-CM | POA: Diagnosis present

## 2017-03-15 DIAGNOSIS — F17210 Nicotine dependence, cigarettes, uncomplicated: Secondary | ICD-10-CM | POA: Diagnosis present

## 2017-03-15 DIAGNOSIS — Z885 Allergy status to narcotic agent status: Secondary | ICD-10-CM

## 2017-03-15 DIAGNOSIS — T380X5A Adverse effect of glucocorticoids and synthetic analogues, initial encounter: Secondary | ICD-10-CM | POA: Diagnosis not present

## 2017-03-15 DIAGNOSIS — Z8711 Personal history of peptic ulcer disease: Secondary | ICD-10-CM | POA: Diagnosis not present

## 2017-03-15 DIAGNOSIS — I25119 Atherosclerotic heart disease of native coronary artery with unspecified angina pectoris: Secondary | ICD-10-CM | POA: Diagnosis not present

## 2017-03-15 DIAGNOSIS — I2510 Atherosclerotic heart disease of native coronary artery without angina pectoris: Secondary | ICD-10-CM | POA: Diagnosis not present

## 2017-03-15 DIAGNOSIS — Z79899 Other long term (current) drug therapy: Secondary | ICD-10-CM

## 2017-03-15 DIAGNOSIS — D473 Essential (hemorrhagic) thrombocythemia: Secondary | ICD-10-CM | POA: Diagnosis not present

## 2017-03-15 DIAGNOSIS — E1121 Type 2 diabetes mellitus with diabetic nephropathy: Secondary | ICD-10-CM | POA: Diagnosis not present

## 2017-03-15 DIAGNOSIS — J9601 Acute respiratory failure with hypoxia: Secondary | ICD-10-CM | POA: Diagnosis present

## 2017-03-15 DIAGNOSIS — E785 Hyperlipidemia, unspecified: Secondary | ICD-10-CM | POA: Diagnosis not present

## 2017-03-15 DIAGNOSIS — I480 Paroxysmal atrial fibrillation: Secondary | ICD-10-CM | POA: Clinically undetermined

## 2017-03-15 DIAGNOSIS — D500 Iron deficiency anemia secondary to blood loss (chronic): Secondary | ICD-10-CM | POA: Diagnosis not present

## 2017-03-15 DIAGNOSIS — I5043 Acute on chronic combined systolic (congestive) and diastolic (congestive) heart failure: Secondary | ICD-10-CM | POA: Diagnosis not present

## 2017-03-15 DIAGNOSIS — E7800 Pure hypercholesterolemia, unspecified: Secondary | ICD-10-CM | POA: Diagnosis present

## 2017-03-15 DIAGNOSIS — E119 Type 2 diabetes mellitus without complications: Secondary | ICD-10-CM | POA: Diagnosis present

## 2017-03-15 DIAGNOSIS — F17200 Nicotine dependence, unspecified, uncomplicated: Secondary | ICD-10-CM | POA: Diagnosis not present

## 2017-03-15 DIAGNOSIS — I959 Hypotension, unspecified: Secondary | ICD-10-CM | POA: Diagnosis present

## 2017-03-15 DIAGNOSIS — E1129 Type 2 diabetes mellitus with other diabetic kidney complication: Secondary | ICD-10-CM | POA: Diagnosis present

## 2017-03-15 DIAGNOSIS — Z765 Malingerer [conscious simulation]: Secondary | ICD-10-CM | POA: Diagnosis not present

## 2017-03-15 DIAGNOSIS — I519 Heart disease, unspecified: Secondary | ICD-10-CM | POA: Diagnosis not present

## 2017-03-15 DIAGNOSIS — D649 Anemia, unspecified: Secondary | ICD-10-CM

## 2017-03-15 DIAGNOSIS — I10 Essential (primary) hypertension: Secondary | ICD-10-CM | POA: Diagnosis present

## 2017-03-15 DIAGNOSIS — Z955 Presence of coronary angioplasty implant and graft: Secondary | ICD-10-CM | POA: Diagnosis not present

## 2017-03-15 HISTORY — DX: Chronic combined systolic (congestive) and diastolic (congestive) heart failure: I50.42

## 2017-03-15 LAB — HEPATIC FUNCTION PANEL
ALK PHOS: 103 U/L (ref 38–126)
ALT: 14 U/L (ref 14–54)
AST: 31 U/L (ref 15–41)
Albumin: 3.4 g/dL — ABNORMAL LOW (ref 3.5–5.0)
Bilirubin, Direct: <0.1 mg/dL — ABNORMAL LOW (ref 0.1–0.5)
TOTAL PROTEIN: 6.8 g/dL (ref 6.5–8.1)
Total Bilirubin: 0.6 mg/dL (ref 0.3–1.2)

## 2017-03-15 LAB — BASIC METABOLIC PANEL
ANION GAP: 9 (ref 5–15)
BUN: 9 mg/dL (ref 6–20)
CALCIUM: 9.3 mg/dL (ref 8.9–10.3)
CO2: 23 mmol/L (ref 22–32)
Chloride: 104 mmol/L (ref 101–111)
Creatinine, Ser: 1.08 mg/dL — ABNORMAL HIGH (ref 0.44–1.00)
GFR calc Af Amer: 58 mL/min — ABNORMAL LOW (ref >60)
GFR, EST NON AFRICAN AMERICAN: 50 mL/min — AB (ref >60)
Glucose, Bld: 196 mg/dL — ABNORMAL HIGH (ref 65–99)
Potassium: 3.9 mmol/L (ref 3.5–5.1)
Sodium: 136 mmol/L (ref 135–145)

## 2017-03-15 LAB — CBC
HCT: 30.2 % — ABNORMAL LOW (ref 36.0–46.0)
HEMOGLOBIN: 9.2 g/dL — AB (ref 12.0–15.0)
MCH: 24.7 pg — ABNORMAL LOW (ref 26.0–34.0)
MCHC: 30.5 g/dL (ref 30.0–36.0)
MCV: 81 fL (ref 78.0–100.0)
Platelets: 446 10*3/uL — ABNORMAL HIGH (ref 150–400)
RBC: 3.73 MIL/uL — AB (ref 3.87–5.11)
RDW: 17.2 % — ABNORMAL HIGH (ref 11.5–15.5)
WBC: 12.4 10*3/uL — ABNORMAL HIGH (ref 4.0–10.5)

## 2017-03-15 LAB — GLUCOSE, CAPILLARY: GLUCOSE-CAPILLARY: 129 mg/dL — AB (ref 65–99)

## 2017-03-15 LAB — I-STAT TROPONIN, ED: TROPONIN I, POC: 0.05 ng/mL (ref 0.00–0.08)

## 2017-03-15 LAB — BRAIN NATRIURETIC PEPTIDE: B NATRIURETIC PEPTIDE 5: 2545.7 pg/mL — AB (ref 0.0–100.0)

## 2017-03-15 LAB — D-DIMER, QUANTITATIVE: D-Dimer, Quant: 1.61 ug/mL-FEU — ABNORMAL HIGH (ref 0.00–0.50)

## 2017-03-15 LAB — TROPONIN I: TROPONIN I: 0.06 ng/mL — AB (ref <0.03)

## 2017-03-15 MED ORDER — IOPAMIDOL (ISOVUE-370) INJECTION 76%
INTRAVENOUS | Status: AC
  Filled 2017-03-15: qty 100

## 2017-03-15 MED ORDER — ISOSORBIDE MONONITRATE ER 30 MG PO TB24
30.0000 mg | ORAL_TABLET | Freq: Every day | ORAL | Status: DC
  Filled 2017-03-15: qty 1

## 2017-03-15 MED ORDER — IRBESARTAN-HYDROCHLOROTHIAZIDE 150-12.5 MG PO TABS: 1.0000 | ORAL_TABLET | Freq: Every day | ORAL | Status: DC

## 2017-03-15 MED ORDER — ACETAMINOPHEN 325 MG PO TABS: 650.0000 mg | ORAL_TABLET | Freq: Four times a day (QID) | ORAL | Status: DC | PRN

## 2017-03-15 MED ORDER — TRAMADOL HCL 50 MG PO TABS
50.0000 mg | ORAL_TABLET | Freq: Four times a day (QID) | ORAL | Status: DC | PRN
  Administered 2017-03-15 – 2017-03-16 (×3): 100 mg via ORAL
  Filled 2017-03-15 (×3): qty 2

## 2017-03-15 MED ORDER — ONDANSETRON HCL 4 MG/2ML IJ SOLN: 4.0000 mg | Freq: Four times a day (QID) | INTRAMUSCULAR | Status: DC | PRN

## 2017-03-15 MED ORDER — FUROSEMIDE 10 MG/ML IJ SOLN
20.0000 mg | Freq: Two times a day (BID) | INTRAMUSCULAR | Status: DC
  Administered 2017-03-15: 20 mg via INTRAVENOUS
  Filled 2017-03-15: qty 2

## 2017-03-15 MED ORDER — ACETAMINOPHEN 325 MG PO TABS
650.0000 mg | ORAL_TABLET | ORAL | Status: DC | PRN
  Filled 2017-03-15: qty 2

## 2017-03-15 MED ORDER — VITAMIN B-12 1000 MCG PO TABS
2000.0000 ug | ORAL_TABLET | Freq: Every day | ORAL | Status: DC
  Administered 2017-03-16 – 2017-03-22 (×7): 2000 ug via ORAL
  Filled 2017-03-15 (×7): qty 2

## 2017-03-15 MED ORDER — ONDANSETRON HCL 4 MG/2ML IJ SOLN
4.0000 mg | Freq: Four times a day (QID) | INTRAMUSCULAR | Status: DC | PRN
  Administered 2017-03-16 – 2017-03-21 (×6): 4 mg via INTRAVENOUS
  Filled 2017-03-15 (×6): qty 2

## 2017-03-15 MED ORDER — IPRATROPIUM-ALBUTEROL 0.5-2.5 (3) MG/3ML IN SOLN: 3.0000 mL | RESPIRATORY_TRACT | Status: DC | PRN

## 2017-03-15 MED ORDER — INSULIN ASPART 100 UNIT/ML ~~LOC~~ SOLN
0.0000 [IU] | Freq: Every day | SUBCUTANEOUS | Status: DC
  Administered 2017-03-16: 2 [IU] via SUBCUTANEOUS

## 2017-03-15 MED ORDER — SODIUM CHLORIDE 0.9% FLUSH: 3.0000 mL | INTRAVENOUS | Status: DC | PRN

## 2017-03-15 MED ORDER — CLOPIDOGREL BISULFATE 75 MG PO TABS
75.0000 mg | ORAL_TABLET | Freq: Every day | ORAL | Status: DC
  Administered 2017-03-16 – 2017-03-22 (×7): 75 mg via ORAL
  Filled 2017-03-15 (×7): qty 1

## 2017-03-15 MED ORDER — PREDNISONE 50 MG PO TABS
50.0000 mg | ORAL_TABLET | Freq: Every day | ORAL | Status: DC
  Administered 2017-03-15 – 2017-03-16 (×2): 50 mg via ORAL
  Filled 2017-03-15 (×2): qty 1

## 2017-03-15 MED ORDER — SODIUM CHLORIDE 0.9 % IV SOLN: 250.0000 mL | INTRAVENOUS | Status: DC | PRN

## 2017-03-15 MED ORDER — PANTOPRAZOLE SODIUM 40 MG PO TBEC: 40.0000 mg | DELAYED_RELEASE_TABLET | Freq: Every day | ORAL | Status: DC | PRN

## 2017-03-15 MED ORDER — IOPAMIDOL (ISOVUE-370) INJECTION 76%
100.0000 mL | Freq: Once | INTRAVENOUS | Status: AC | PRN
  Administered 2017-03-15: 100 mL via INTRAVENOUS

## 2017-03-15 MED ORDER — ENOXAPARIN SODIUM 40 MG/0.4ML ~~LOC~~ SOLN
40.0000 mg | SUBCUTANEOUS | Status: DC
  Administered 2017-03-16 – 2017-03-17 (×2): 40 mg via SUBCUTANEOUS
  Filled 2017-03-15 (×3): qty 0.4

## 2017-03-15 MED ORDER — PENTOXIFYLLINE ER 400 MG PO TBCR
400.0000 mg | EXTENDED_RELEASE_TABLET | Freq: Every day | ORAL | Status: DC
  Administered 2017-03-16 – 2017-03-22 (×7): 400 mg via ORAL
  Filled 2017-03-15 (×7): qty 1

## 2017-03-15 MED ORDER — ATORVASTATIN CALCIUM 10 MG PO TABS
10.0000 mg | ORAL_TABLET | Freq: Every day | ORAL | Status: DC
  Administered 2017-03-15 – 2017-03-21 (×7): 10 mg via ORAL
  Filled 2017-03-15 (×7): qty 1

## 2017-03-15 MED ORDER — OXYCODONE-ACETAMINOPHEN 5-325 MG PO TABS
1.0000 | ORAL_TABLET | Freq: Once | ORAL | Status: AC
  Administered 2017-03-15: 1 via ORAL
  Filled 2017-03-15: qty 1

## 2017-03-15 MED ORDER — SODIUM CHLORIDE 0.9% FLUSH
3.0000 mL | Freq: Two times a day (BID) | INTRAVENOUS | Status: DC
  Administered 2017-03-15 – 2017-03-21 (×7): 3 mL via INTRAVENOUS

## 2017-03-15 MED ORDER — ROPINIROLE HCL 1 MG PO TABS
2.0000 mg | ORAL_TABLET | Freq: Every day | ORAL | Status: DC
  Administered 2017-03-15 – 2017-03-21 (×7): 2 mg via ORAL
  Filled 2017-03-15 (×9): qty 2

## 2017-03-15 MED ORDER — IRBESARTAN 300 MG PO TABS
150.0000 mg | ORAL_TABLET | Freq: Every day | ORAL | Status: DC
  Filled 2017-03-15: qty 1

## 2017-03-15 MED ORDER — MOMETASONE FURO-FORMOTEROL FUM 200-5 MCG/ACT IN AERO
2.0000 | INHALATION_SPRAY | Freq: Two times a day (BID) | RESPIRATORY_TRACT | Status: DC
  Administered 2017-03-16 – 2017-03-22 (×11): 2 via RESPIRATORY_TRACT
  Filled 2017-03-15 (×3): qty 8.8

## 2017-03-15 MED ORDER — INSULIN ASPART 100 UNIT/ML ~~LOC~~ SOLN
0.0000 [IU] | Freq: Three times a day (TID) | SUBCUTANEOUS | Status: DC
  Administered 2017-03-16 (×2): 3 [IU] via SUBCUTANEOUS
  Administered 2017-03-16 – 2017-03-17 (×2): 5 [IU] via SUBCUTANEOUS
  Administered 2017-03-17 – 2017-03-18 (×3): 3 [IU] via SUBCUTANEOUS
  Administered 2017-03-18: 2 [IU] via SUBCUTANEOUS
  Administered 2017-03-18: 3 [IU] via SUBCUTANEOUS
  Administered 2017-03-19: 5 [IU] via SUBCUTANEOUS
  Administered 2017-03-19: 8 [IU] via SUBCUTANEOUS
  Administered 2017-03-20 – 2017-03-21 (×3): 2 [IU] via SUBCUTANEOUS

## 2017-03-15 MED ORDER — HYDROCHLOROTHIAZIDE 12.5 MG PO CAPS
12.5000 mg | ORAL_CAPSULE | Freq: Every day | ORAL | Status: DC
  Filled 2017-03-15: qty 1

## 2017-03-15 MED ORDER — FENTANYL CITRATE (PF) 100 MCG/2ML IJ SOLN
50.0000 ug | Freq: Once | INTRAMUSCULAR | Status: AC
  Administered 2017-03-15: 50 ug via INTRAVENOUS
  Filled 2017-03-15: qty 2

## 2017-03-15 MED ORDER — VERAPAMIL HCL ER 180 MG PO TBCR
180.0000 mg | EXTENDED_RELEASE_TABLET | Freq: Every day | ORAL | Status: DC
  Administered 2017-03-15: 180 mg via ORAL
  Filled 2017-03-15 (×2): qty 1

## 2017-03-15 NOTE — ED Triage Notes (Signed)
Pt reports SOB and presents with labored respirations, RR 28. O2 100% on RA and pt is speaking in clear complete sentences. She states she has been falling a lot recently and states she has multiple right rib fractures per her MD. She reports pain behind her left breast.
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
## 2017-03-15 NOTE — ED Notes (Signed)
Attempted report 

## 2017-03-15 NOTE — ED Notes (Signed)
Patient ambulated in hallways requiring frequent breaks due to subjective shortness of breath but persisted walking per patient request. SpO2 initially maintained between 90-98% while walking. When patient returned to room and sat to rest SpO2 dropped to 72% but quickly increased to 96% within 2 minutes of deep breathing.

## 2017-03-15 NOTE — Progress Notes (Signed)
Troponin 0.06. NP paged.
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
## 2017-03-16 ENCOUNTER — Inpatient Hospital Stay (HOSPITAL_COMMUNITY): Payer: Medicare Other

## 2017-03-16 ENCOUNTER — Other Ambulatory Visit: Payer: Self-pay

## 2017-03-16 DIAGNOSIS — J9601 Acute respiratory failure with hypoxia: Secondary | ICD-10-CM

## 2017-03-16 DIAGNOSIS — J441 Chronic obstructive pulmonary disease with (acute) exacerbation: Secondary | ICD-10-CM

## 2017-03-16 DIAGNOSIS — Z765 Malingerer [conscious simulation]: Secondary | ICD-10-CM

## 2017-03-16 DIAGNOSIS — I5043 Acute on chronic combined systolic (congestive) and diastolic (congestive) heart failure: Secondary | ICD-10-CM

## 2017-03-16 DIAGNOSIS — I5023 Acute on chronic systolic (congestive) heart failure: Secondary | ICD-10-CM

## 2017-03-16 LAB — TROPONIN I
TROPONIN I: 0.03 ng/mL — AB (ref <0.03)
Troponin I: 0.05 ng/mL (ref <0.03)

## 2017-03-16 LAB — ECHOCARDIOGRAM COMPLETE
Height: 60.5 in
WEIGHTICAEL: 1844.8 [oz_av]

## 2017-03-16 LAB — GLUCOSE, CAPILLARY
GLUCOSE-CAPILLARY: 161 mg/dL — AB (ref 65–99)
GLUCOSE-CAPILLARY: 216 mg/dL — AB (ref 65–99)
Glucose-Capillary: 174 mg/dL — ABNORMAL HIGH (ref 65–99)
Glucose-Capillary: 230 mg/dL — ABNORMAL HIGH (ref 65–99)

## 2017-03-16 LAB — BASIC METABOLIC PANEL
Anion gap: 8 (ref 5–15)
BUN: 11 mg/dL (ref 6–20)
CO2: 27 mmol/L (ref 22–32)
CREATININE: 1.07 mg/dL — AB (ref 0.44–1.00)
Calcium: 8.8 mg/dL — ABNORMAL LOW (ref 8.9–10.3)
Chloride: 102 mmol/L (ref 101–111)
GFR calc Af Amer: 59 mL/min — ABNORMAL LOW (ref >60)
GFR, EST NON AFRICAN AMERICAN: 51 mL/min — AB (ref >60)
Glucose, Bld: 209 mg/dL — ABNORMAL HIGH (ref 65–99)
Potassium: 3.8 mmol/L (ref 3.5–5.1)
SODIUM: 137 mmol/L (ref 135–145)

## 2017-03-16 MED ORDER — IPRATROPIUM-ALBUTEROL 0.5-2.5 (3) MG/3ML IN SOLN
3.0000 mL | Freq: Three times a day (TID) | RESPIRATORY_TRACT | Status: DC
  Administered 2017-03-17 – 2017-03-22 (×15): 3 mL via RESPIRATORY_TRACT
  Filled 2017-03-16 (×16): qty 3

## 2017-03-16 MED ORDER — TRAMADOL HCL 50 MG PO TABS
50.0000 mg | ORAL_TABLET | Freq: Four times a day (QID) | ORAL | Status: DC | PRN
  Administered 2017-03-16 – 2017-03-22 (×14): 50 mg via ORAL
  Filled 2017-03-16 (×16): qty 1

## 2017-03-16 MED ORDER — METHOCARBAMOL 500 MG PO TABS
500.0000 mg | ORAL_TABLET | Freq: Three times a day (TID) | ORAL | Status: DC
  Administered 2017-03-16 – 2017-03-22 (×18): 500 mg via ORAL
  Filled 2017-03-16 (×18): qty 1

## 2017-03-16 MED ORDER — IPRATROPIUM-ALBUTEROL 0.5-2.5 (3) MG/3ML IN SOLN
3.0000 mL | Freq: Four times a day (QID) | RESPIRATORY_TRACT | Status: DC
  Administered 2017-03-16 (×2): 3 mL via RESPIRATORY_TRACT
  Filled 2017-03-16 (×3): qty 3

## 2017-03-16 MED ORDER — OXYCODONE-ACETAMINOPHEN 5-325 MG PO TABS
1.0000 | ORAL_TABLET | Freq: Once | ORAL | Status: AC
  Administered 2017-03-16: 1 via ORAL
  Filled 2017-03-16: qty 1

## 2017-03-16 MED ORDER — METHYLPREDNISOLONE SODIUM SUCC 125 MG IJ SOLR: 60.0000 mg | Freq: Four times a day (QID) | INTRAMUSCULAR | Status: DC

## 2017-03-16 MED ORDER — OXYCODONE HCL 5 MG PO TABS: 5.0000 mg | ORAL_TABLET | ORAL | Status: DC | PRN

## 2017-03-16 MED ORDER — OXYCODONE HCL 5 MG PO TABS
5.0000 mg | ORAL_TABLET | Freq: Four times a day (QID) | ORAL | Status: DC | PRN
  Administered 2017-03-16: 5 mg via ORAL
  Filled 2017-03-16: qty 1

## 2017-03-16 MED ORDER — FUROSEMIDE 10 MG/ML IJ SOLN
40.0000 mg | Freq: Two times a day (BID) | INTRAMUSCULAR | Status: DC
  Administered 2017-03-16 – 2017-03-17 (×3): 40 mg via INTRAVENOUS
  Filled 2017-03-16 (×3): qty 4

## 2017-03-16 MED ORDER — METHYLPREDNISOLONE SODIUM SUCC 125 MG IJ SOLR
60.0000 mg | Freq: Four times a day (QID) | INTRAMUSCULAR | Status: DC
  Administered 2017-03-16 – 2017-03-17 (×5): 60 mg via INTRAVENOUS
  Filled 2017-03-16 (×5): qty 2

## 2017-03-16 NOTE — Progress Notes (Signed)
PROGRESS NOTE    Kimberly Downs  WCB:[id number] DOB: Jan 12, 1946 DOA: 03/15/2017 PCP: [name], DO   Brief Narrative: 71 y.o. female with medical history significant for COPD, coronary artery disease with stenting, diabetes mellitus with diet control, hypertension, combined systolic/diastolic CHF, and drug-seeking behavior, now presenting to the emergency department from home for evaluation of shortness of breath and right lateral chest wall pain.  Assessment & Plan:   # Acute on chronic combined systolic and diastolic CHF (congestive heart failure) ([place]): -Patient presented with dyspnea on exertion, pedal edema and nonspecific chest pain. Chest x-ray with probable mild CHF and interstitial edema. BNP elevated to 2545. CT angiogram negative for PE. Increased IV Lasix to 40 mg bid as tolerated by BP. Follow-up echocardiogram. Low-salt diet. Strict ins and outs.  #Possible COPD acute exacerbation: Patient is active smoker. Education provided to the patient. -Start DuoNeb nebulization, Solu-Medrol 60 mg every 6 hourly and dulera.  #Acute respiratory failure with hypoxia improved. Currently in room air.  #Hypotension: Unknown if it is contributed by oxycodone. Holding Imdur and irberatan-hctz. Plan to continue IV Lasix as tolerated. Follow-up echocardiogram. Continue to monitor blood pressure closely. -on verapamil.  #Chest pain: CT chest to rule out PE. Continue to treat for CHF and follow the echo. Chest x-ray negative for acute bony abnormalities. Mild elevation in troponin likely due to CHF.  #Pain management: Patient is asking for high-dose Dilaudid for the pain management. Education provided the patient regarding a strong narcotics and low blood pressure. She is okay with oral oxycodone as needed for now. Continue to monitor.  # Normocytic anemia, leukocytosis and thrombocytosis: monitor CBC. No sign of bleeding.   DVT prophylaxis:lovenox sq Code Status:full Family Communication:no  family at bedside Disposition Plan:likely dc home in 1-2 days  Consultants:   none  Procedures:echo Antimicrobials:none  Subjective: Seen and examined at bedside. Reported generalized body pain and asking for Dilaudid. Has cough, mild shortness of breath. Denied headache, dizziness.  Objective: Vitals:   03/16/17 0955 03/16/17 1000 03/16/17 1045 03/16/17 1332  BP: (!) 88/57  (!) 86/52 (!) 86/54  Pulse: 85 (!) 128 82   Resp:  20    Temp:   (!) 97.5 F (36.4 C)   TempSrc:   Oral   SpO2: 94% 98%    Weight:      Height:        Intake/Output Summary (Last 24 hours) at 03/16/17 1428 Last data filed at 03/16/17 0900  Gross per 24 hour  Intake              723 ml  Output             1200 ml  Net             -477 ml   Filed Weights   03/15/17 2306 03/16/17 0339  Weight: 52.8 kg (116 lb 8 oz) 52.3 kg (115 lb 4.8 oz)    Examination:  General exam: Appears calm and comfortable  Respiratory system: Bibasal while wheezing and decreased breath sound, respiratory effort normal Cardiovascular system: S1 & S2 heard, RRR.  No pedal edema. Gastrointestinal system: Abdomen is nondistended, soft and nontender. Normal bowel sounds heard. Central nervous system: Alert and oriented. No focal neurological deficits. Extremities: Symmetric 5 x 5 power. Skin: No rashes, lesions or ulcers Psychiatry: Judgement and insight appear normal. Mood & affect appropriate.     Data Reviewed: I have personally reviewed following labs and imaging studies  CBC:  Recent  Labs Lab 03/15/17 1508  WBC 12.4*  HGB 9.2*  HCT 30.2*  MCV 81.0  PLT 789*   Basic Metabolic Panel:  Recent Labs Lab 03/15/17 1508 03/16/17 0422  NA 136 137  K 3.9 3.8  CL 104 102  CO2 23 27  GLUCOSE 196* 209*  BUN 9 11  CREATININE 1.08* 1.07*  CALCIUM 9.3 8.8*   GFR: Estimated Creatinine Clearance: 35 mL/min (A) (by C-G formula based on SCr of 1.07 mg/dL (H)). Liver Function Tests:  Recent Labs Lab  03/15/17 1730  AST 31  ALT 14  ALKPHOS 103  BILITOT 0.6  PROT 6.8  ALBUMIN 3.4*   No results for input(s): LIPASE, AMYLASE in the last 168 hours. No results for input(s): AMMONIA in the last 168 hours. Coagulation Profile: No results for input(s): INR, PROTIME in the last 168 hours. Cardiac Enzymes:  Recent Labs Lab 03/15/17 2303 03/16/17 0422 03/16/17 1014  TROPONINI 0.06* 0.05* 0.03*   BNP (last 3 results) No results for input(s): PROBNP in the last 8760 hours. HbA1C: No results for input(s): HGBA1C in the last 72 hours. CBG:  Recent Labs Lab 03/15/17 2335 03/16/17 0731 03/16/17 1056  GLUCAP 129* 161* 216*   Lipid Profile: No results for input(s): CHOL, HDL, LDLCALC, TRIG, CHOLHDL, LDLDIRECT in the last 72 hours. Thyroid Function Tests: No results for input(s): TSH, T4TOTAL, FREET4, T3FREE, THYROIDAB in the last 72 hours. Anemia Panel: No results for input(s): VITAMINB12, FOLATE, FERRITIN, TIBC, IRON, RETICCTPCT in the last 72 hours. Sepsis Labs: No results for input(s): PROCALCITON, LATICACIDVEN in the last 168 hours.  No results found for this or any previous visit (from the past 240 hour(s)).       Radiology Studies: Dg Chest 2 View  Result Date: 03/15/2017 CLINICAL DATA:  Swelling of the lower extremities. Shortness of breath and chest pain, 4-5 days duration. EXAM: CHEST  2 VIEW COMPARISON:  03/14/2017.  02/14/2017. FINDINGS: The heart is enlarged. There is aortic atherosclerosis. There is pulmonary venous hypertension, probably with early interstitial edema and a small amount of pleural fluid. No focal lesion. No significant bone finding except for chronic increased kyphosis. IMPRESSION: Probable mild congestive heart failure. Cardiomegaly, venous hypertension, mild interstitial edema and small effusions. Electronically Signed   By: Nelson Chimes M.D.   On: 03/15/2017 15:55   Ct Angio Chest Pe W And/or Wo Contrast  Result Date: 03/15/2017 CLINICAL DATA:   Chest pain and shortness of Breath EXAM: CT ANGIOGRAPHY CHEST WITH CONTRAST TECHNIQUE: Multidetector CT imaging of the chest was performed using the standard protocol during bolus administration of intravenous contrast. Multiplanar CT image reconstructions and MIPs were obtained to evaluate the vascular anatomy. CONTRAST:  100 mL Isovue 370. COMPARISON:  Chest film from earlier in the same day FINDINGS: Cardiovascular: Diffuse aortic calcifications are noted without aneurysmal dilatation. Poor opacification of the aorta is noted. No significant cardiac enlargement is seen. Heavy coronary calcifications are noted. The pulmonary artery shows a normal branching pattern without filling defect to suggest pulmonary embolism. Mediastinum/Nodes: No significant hilar or mediastinal adenopathy is identified. The thoracic inlet is within normal limits. The esophagus is also within normal limits. A small hiatal hernia is noted. Lungs/Pleura: Mild bibasilar atelectatic changes are noted. No focal confluent infiltrate is seen. No focal nodule is seen. No sizable effusion or pneumothorax is noted. Mild emphysematous changes are noted. Upper Abdomen: Within normal limits. Musculoskeletal: Old bilateral rib fractures are noted with nonunion. Review of the MIP images confirms the above  findings. IMPRESSION: No evidence of pulmonary emboli. Mild bibasilar atelectatic changes. No acute abnormality is seen. Aortic Atherosclerosis (ICD10-I70.0) and Emphysema (ICD10-J43.9). Electronically Signed   By: Inez Catalina M.D.   On: 03/15/2017 19:46        Scheduled Meds: . atorvastatin  10 mg Oral QHS  . clopidogrel  75 mg Oral Daily  . enoxaparin (LOVENOX) injection  40 mg Subcutaneous Q24H  . furosemide  40 mg Intravenous Q12H  . insulin aspart  0-15 Units Subcutaneous TID WC  . insulin aspart  0-5 Units Subcutaneous QHS  . ipratropium-albuterol  3 mL Nebulization Q6H  . methylPREDNISolone (SOLU-MEDROL) injection  60 mg  Intravenous Q6H  . mometasone-formoterol  2 puff Inhalation BID  . pentoxifylline  400 mg Oral Daily  . rOPINIRole  2 mg Oral QHS  . sodium chloride flush  3 mL Intravenous Q12H  . verapamil  180 mg Oral QHS  . cyanocobalamin  2,000 mcg Oral Daily   Continuous Infusions: . sodium chloride       LOS: 1 day    Stuart Mirabile Tanna Furry, MD [facility] Pager [phone]  If 7PM-7AM, please contact night-coverage [email or web address] Password [email or web address] 03/16/2017, 2:28 PM

## 2017-03-16 NOTE — Progress Notes (Signed)
Patient called RN to room saying she was sweating profusely.  RN went to room, patient sweating hair wet, gown wet.  BP continues to be low at 86/52 left arm manual by RN.  Patient has not received anything to lower her BP (all held at 10 a.m. Meds, no Lasix given since 2300 last night).  Text page sent to Dr. Carolin Sicks with above.

## 2017-03-16 NOTE — Progress Notes (Signed)
Patient continually calling RN on her phone requesting something for pain.  This is not a new pain, she sometimes will say she has had it for days, sometimes weeks and often "for a while".  Was given Oxycodone earlier in the shift, still wanted the Tramadol.  BP 80's/50's all shift so not able to give Lasix, MD did not want her to have further oxycodone with BP still low.  RN has explained this to patient multiple times, patient still adamant she has to have something for this pain.  Text page sent to MD regarding all of the above.

## 2017-03-16 NOTE — Progress Notes (Signed)
Nutrition Brief Note  Patient identified on the Malnutrition Screening Tool (MST) Report  Wt Readings from Last 15 Encounters:  03/16/17 115 lb 4.8 oz (52.3 kg)  03/09/17 111 lb 12.8 oz (50.7 kg)  02/09/17 107 lb 9.6 oz (48.8 kg)  02/02/17 113 lb 6.4 oz (51.4 kg)  07/28/16 111 lb (50.3 kg)  06/02/16 106 lb (48.1 kg)  04/23/16 101 lb 6.4 oz (46 kg)  04/05/16 73 lb 6.6 oz (33.3 kg)  02/04/16 105 lb 3.2 oz (47.7 kg)  01/28/16 105 lb 6.4 oz (47.8 kg)  12/02/15 118 lb 9.6 oz (53.8 kg)  11/12/15 130 lb (59 kg)  06/02/15 136 lb (61.7 kg)    Body mass index is 22.15 kg/m.  No significant wt loss in past year  Current diet order is Heart Healthy/Carb Modified, patient is consuming approximately 100% of meals at this time. Labs and medications reviewed.   No nutrition interventions warranted at this time. If nutrition issues arise, please consult RD.   Kerman Passey MS, RD, LDN [phone] Pager  [phone] Weekend/On-Call Pager

## 2017-03-16 NOTE — Progress Notes (Signed)
Patient concerned about her primary doctor saying she had cracked ribs that was causing her pain. Her xray here showed no fractures.

## 2017-03-16 NOTE — Progress Notes (Signed)
Patient is very adamant to receive frequent oxycodone. Patient was educated. She has low BP. She denied headache, dizziness. Continue ultram as needed and added robaxin. Dc oxycodone. D/w RN as well.

## 2017-03-16 NOTE — Evaluation (Signed)
Physical Therapy Evaluation Patient Details Name: Kimberly Downs MRN: [id number] DOB: [birth year] Today's Date: 03/16/2017   History of Present Illness  71 y.o. female with onset of CHF and cardiomegaly, flat elevated troponin, atelectasis and increased LE edema was admitted.  Has tachycardia, drops in BP, pulm effusion.  PMHx:  ASCVD, stent, AA angiogram, DM, HTN, HLD, COPD, bronchitis, a-fib, respiratory failure with intubation, emphysema,  Clinical Impression  Pt is up to walk with assistance and had close guard of two due to her low BP's sitting and standing.  Nursing encouraged PT to walk pt, and was informed of lower values for BP.  Will follow her acutely and will progress as tolerated, including monitoring for drops in BP.      Follow Up Recommendations Home health PT;Supervision for mobility/OOB    Equipment Recommendations  Rolling walker with 5" wheels (if pt does not have a walker in good repair)    Recommendations for Other Services       Precautions / Restrictions Precautions Precautions: Fall (telemetry) Restrictions Weight Bearing Restrictions: No      Mobility  Bed Mobility Overal bed mobility: Needs Assistance Bed Mobility: Supine to Sit;Sit to Supine     Supine to sit: Min guard;Min assist Sit to supine: Min assist   General bed mobility comments: verbal set up and tactile prompts, using bedrail  Transfers Overall transfer level: Needs assistance Equipment used: Rolling walker (2 wheeled);1 person hand held assist Transfers: Sit to/from Stand Sit to Stand: Min guard         General transfer comment: pt can use her hands on bed to stand but needs reminders for sequence  Ambulation/Gait Ambulation/Gait assistance: Min guard;Min assist Ambulation Distance (Feet): 90 Feet Assistive device: Rolling walker (2 wheeled);1 person hand held assist Gait Pattern/deviations: Step-through pattern;Decreased stride length;Shuffle;Narrow base of support;Trunk  flexed Gait velocity: reduced Gait velocity interpretation: Below normal speed for age/gender General Gait Details: pt had controlled BP with standing relative to sitting, but closely monitored her balance on hall wtih gait  Stairs            Wheelchair Mobility    Modified Rankin (Stroke Patients Only)       Balance Overall balance assessment: Needs assistance Sitting-balance support: Feet supported Sitting balance-Leahy Scale: Good     Standing balance support: Bilateral upper extremity supported Standing balance-Leahy Scale: Fair                               Pertinent Vitals/Pain Pain Assessment: No/denies pain    Home Living Family/patient expects to be discharged to:: Private residence Living Arrangements: Children;Other relatives Available Help at Discharge: Family;Available PRN/intermittently Type of Home: House Home Access: Stairs to enter Entrance Stairs-Rails: Right;Left Entrance Stairs-Number of Steps: 6 Home Layout: Multi-level;Able to live on main level with bedroom/bathroom Home Equipment: Walker - 4 wheels      Prior Function Level of Independence: Independent with assistive device(s)               Hand Dominance        Extremity/Trunk Assessment   Upper Extremity Assessment Upper Extremity Assessment: Generalized weakness    Lower Extremity Assessment Lower Extremity Assessment: Generalized weakness    Cervical / Trunk Assessment Cervical / Trunk Assessment: Kyphotic  Communication   Communication: No difficulties  Cognition Arousal/Alertness: Awake/alert Behavior During Therapy: WFL for tasks assessed/performed Overall Cognitive Status: Within Functional Limits for tasks assessed  General Comments General comments (skin integrity, edema, etc.): BP was ck'd in bed of 88/57, BP sitting 87/39, standing BP was 83/55.    Exercises     Assessment/Plan     PT Assessment Patient needs continued PT services  PT Problem List Decreased strength;Decreased range of motion;Decreased activity tolerance;Decreased balance;Decreased mobility;Decreased coordination;Decreased cognition;Decreased knowledge of use of DME;Decreased safety awareness;Cardiopulmonary status limiting activity;Decreased skin integrity       PT Treatment Interventions DME instruction;Gait training;Stair training;Functional mobility training;Therapeutic activities;Therapeutic exercise;Balance training;Neuromuscular re-education;Patient/family education    PT Goals (Current goals can be found in the Care Plan section)  Acute Rehab PT Goals Patient Stated Goal: to feel stronger and have better BP PT Goal Formulation: With patient Time For Goal Achievement: 03/30/17 Potential to Achieve Goals: Good    Frequency Min 3X/week   Barriers to discharge Decreased caregiver support (intermittent home help)      Co-evaluation               AM-PAC PT "6 Clicks" Daily Activity  Outcome Measure Difficulty turning over in bed (including adjusting bedclothes, sheets and blankets)?: A Little Difficulty moving from lying on back to sitting on the side of the bed? : A Little Difficulty sitting down on and standing up from a chair with arms (e.g., wheelchair, bedside commode, etc,.)?: A Little Help needed moving to and from a bed to chair (including a wheelchair)?: A Little Help needed walking in hospital room?: A Little Help needed climbing 3-5 steps with a railing? : A Little 6 Click Score: 18    End of Session Equipment Utilized During Treatment: Gait belt Activity Tolerance: Patient tolerated treatment well Patient left: in bed;with call bell/phone within reach;with bed alarm set Nurse Communication: Mobility status PT Visit Diagnosis: Unsteadiness on feet (R26.81);Muscle weakness (generalized) (M62.81)    Time: 4825-0037 PT Time Calculation (min) (ACUTE ONLY): 24  min   Charges:   PT Evaluation $PT Eval Moderate Complexity: 1 Procedure PT Treatments $Gait Training: 8-22 mins   PT G Codes:   PT G-Codes **NOT FOR INPATIENT CLASS** Functional Assessment Tool Used: AM-PAC 6 Clicks Basic Mobility   Ramond Dial 03/16/2017, 2:26 PM   Mee Hives, PT MS Acute Rehab Dept. Number: [place]

## 2017-03-16 NOTE — Progress Notes (Signed)
Patient called Rn to room stating she needed to talk about something.  Told RN that she thought the Tramadol was enough but now she is having pain in her back, ribs, head and legs rated at an 8 and needs something stronger.  Does not normally take pain medication at home.  MD notified of above.

## 2017-03-16 NOTE — Plan of Care (Signed)
Problem: Physical Regulation: Goal: Ability to maintain clinical measurements within normal limits will improve Outcome: Progressing BP low, MD aware, held diuretics and BP meds   Problem: Tissue Perfusion: Goal: Risk factors for ineffective tissue perfusion will decrease Outcome: Progressing O2 sats remain in the 90's on room air   Problem: Fluid Volume: Goal: Ability to maintain a balanced intake and output will improve Outcome: Not Progressing Patient not able to receive diuretics, low output for 7 a to 7 p shift

## 2017-03-16 NOTE — Progress Notes (Signed)
Patient calmed down somewhat after receiving Tramadol and Robaxin and talking to Dr. Carolin Sicks on the phone.  Son at bedside.

## 2017-03-16 NOTE — Progress Notes (Signed)
New Admission Note:   Arrival Method: ED bed with nurse, ambulated to unit bed. Mental Orientation: A&O x4 Telemetry: initiated Skin: bruising on legs, BLE edema, very tanned.  Pain: 9/10, across chest/unber bilateral breasts and has been hurting since last month. Safety Measures: initiated  Orders to be reviewed and implemented. Will continue to monitor the patient. Call light has been placed within reach and bed alarm has been activated.   Riki Altes, RN Phone: [phone]

## 2017-03-16 NOTE — Progress Notes (Signed)
Patient complaining of pain, pain location- under breast (rib cage), back, legs. Pain 7/10.

## 2017-03-16 NOTE — Progress Notes (Signed)
  Echocardiogram 2D Echocardiogram has been performed.  Kimberly Downs 03/16/2017, 2:39 PM

## 2017-03-16 NOTE — Progress Notes (Signed)
OT Cancellation Note    03/16/17 1445  OT Visit Information  Last OT Received On 03/16/17  Reason Eval/Treat Not Completed Patient at procedure or test/ unavailable  [facility], OT/L  [phone] 03/16/2017

## 2017-03-17 ENCOUNTER — Encounter (HOSPITAL_COMMUNITY): Payer: Self-pay | Admitting: Emergency Medicine

## 2017-03-17 DIAGNOSIS — I2510 Atherosclerotic heart disease of native coronary artery without angina pectoris: Secondary | ICD-10-CM

## 2017-03-17 DIAGNOSIS — R079 Chest pain, unspecified: Secondary | ICD-10-CM

## 2017-03-17 LAB — CBC
HCT: 26.2 % — ABNORMAL LOW (ref 36.0–46.0)
HEMOGLOBIN: 7.8 g/dL — AB (ref 12.0–15.0)
MCH: 24.1 pg — AB (ref 26.0–34.0)
MCHC: 29.8 g/dL — AB (ref 30.0–36.0)
MCV: 81.1 fL (ref 78.0–100.0)
Platelets: 373 10*3/uL (ref 150–400)
RBC: 3.23 MIL/uL — ABNORMAL LOW (ref 3.87–5.11)
RDW: 16.5 % — AB (ref 11.5–15.5)
WBC: 9.8 10*3/uL (ref 4.0–10.5)

## 2017-03-17 LAB — BASIC METABOLIC PANEL
ANION GAP: 8 (ref 5–15)
BUN: 24 mg/dL — AB (ref 6–20)
CALCIUM: 9.1 mg/dL (ref 8.9–10.3)
CO2: 29 mmol/L (ref 22–32)
Chloride: 100 mmol/L — ABNORMAL LOW (ref 101–111)
Creatinine, Ser: 1.13 mg/dL — ABNORMAL HIGH (ref 0.44–1.00)
GFR calc Af Amer: 55 mL/min — ABNORMAL LOW (ref >60)
GFR calc non Af Amer: 47 mL/min — ABNORMAL LOW (ref >60)
GLUCOSE: 243 mg/dL — AB (ref 65–99)
Potassium: 3.9 mmol/L (ref 3.5–5.1)
Sodium: 137 mmol/L (ref 135–145)

## 2017-03-17 LAB — IRON AND TIBC
IRON: 13 ug/dL — AB (ref 28–170)
Saturation Ratios: 3 % — ABNORMAL LOW (ref 10.4–31.8)
TIBC: 469 ug/dL — ABNORMAL HIGH (ref 250–450)
UIBC: 456 ug/dL

## 2017-03-17 LAB — GLUCOSE, CAPILLARY
GLUCOSE-CAPILLARY: 164 mg/dL — AB (ref 65–99)
GLUCOSE-CAPILLARY: 163 mg/dL — AB (ref 65–99)
Glucose-Capillary: 218 mg/dL — ABNORMAL HIGH (ref 65–99)
Glucose-Capillary: 197 mg/dL — ABNORMAL HIGH (ref 65–99)

## 2017-03-17 LAB — FERRITIN: Ferritin: 38 ng/mL (ref 11–307)

## 2017-03-17 MED ORDER — SODIUM CHLORIDE 0.9% FLUSH: 3.0000 mL | INTRAVENOUS | Status: DC | PRN

## 2017-03-17 MED ORDER — SODIUM CHLORIDE 0.9% FLUSH
3.0000 mL | Freq: Two times a day (BID) | INTRAVENOUS | Status: DC
  Administered 2017-03-17 – 2017-03-21 (×5): 3 mL via INTRAVENOUS

## 2017-03-17 MED ORDER — SODIUM CHLORIDE 0.9 % WEIGHT BASED INFUSION: 1.0000 mL/kg/h | INTRAVENOUS | Status: DC

## 2017-03-17 MED ORDER — ASPIRIN 81 MG PO CHEW
81.0000 mg | CHEWABLE_TABLET | ORAL | Status: AC
  Administered 2017-03-18: 81 mg via ORAL
  Filled 2017-03-17: qty 1

## 2017-03-17 MED ORDER — PREDNISONE 20 MG PO TABS
40.0000 mg | ORAL_TABLET | Freq: Every day | ORAL | Status: DC
  Administered 2017-03-18 – 2017-03-19 (×2): 40 mg via ORAL
  Filled 2017-03-17 (×2): qty 2

## 2017-03-17 MED ORDER — SODIUM CHLORIDE 0.9 % WEIGHT BASED INFUSION
3.0000 mL/kg/h | INTRAVENOUS | Status: DC
  Administered 2017-03-18: 3 mL/kg/h via INTRAVENOUS

## 2017-03-17 MED ORDER — SODIUM CHLORIDE 0.9 % IV SOLN: 250.0000 mL | INTRAVENOUS | Status: DC | PRN

## 2017-03-17 MED ORDER — DOCUSATE SODIUM 100 MG PO CAPS
100.0000 mg | ORAL_CAPSULE | Freq: Every day | ORAL | Status: DC
  Administered 2017-03-17 – 2017-03-22 (×6): 100 mg via ORAL
  Filled 2017-03-17 (×7): qty 1
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
## 2017-03-17 NOTE — Evaluation (Signed)
Occupational Therapy Evaluation Patient Details Name: Kimberly Downs MRN: [id number] DOB: [birth year] Today's Date: 03/17/2017    History of Present Illness 71 y.o. female with onset of CHF and cardiomegaly, flat elevated troponin, atelectasis and increased LE edema was admitted.  Has tachycardia, drops in BP, pulm effusion.  PMHx:  ASCVD, stent, AA angiogram, DM, HTN, HLD, COPD, bronchitis, a-fib, respiratory failure with intubation, emphysema,   Clinical Impression   Patient evaluated by Occupational Therapy with no further acute OT needs identified. All education has been completed and the patient has no further questions. Pt requires supervision for ADLs.   REcommend tub seat - pt states she will acquire.  See below for any follow-up Occupational Therapy or equipment needs. OT is signing off. Thank you for this referral.      Follow Up Recommendations  No OT follow up;Supervision/Assistance - 24 hour    Equipment Recommendations  Tub/shower seat    Recommendations for Other Services       Precautions / Restrictions Precautions Precautions: Fall Precaution Comments: Pt reports several falls after last admission       Mobility Bed Mobility Overal bed mobility: Independent                Transfers Overall transfer level: Needs assistance Equipment used: Rolling walker (2 wheeled);1 person hand held assist Transfers: Sit to/from [facility] Sit to Stand: Supervision Stand pivot transfers: Supervision            Balance Overall balance assessment: Needs assistance Sitting-balance support: Feet supported Sitting balance-Leahy Scale: Good     Standing balance support: During functional activity;Single extremity supported Standing balance-Leahy Scale: Fair                             ADL either performed or assessed with clinical judgement   ADL Overall ADL's : Needs assistance/impaired Eating/Feeding: Independent   Grooming:  Wash/dry hands;Wash/dry face;Oral care;Brushing hair;Supervision/safety;Standing   Upper Body Bathing: Set up;Sitting   Lower Body Bathing: Supervison/ safety;Sit to/from stand   Upper Body Dressing : Set up;Sitting   Lower Body Dressing: Supervision/safety;Sit to/from stand   Toilet Transfer: Supervision/safety;Ambulation;Comfort height toilet;RW;Grab bars   Toileting- Clothing Manipulation and Hygiene: Supervision/safety;Sit to/from stand     Tub/Shower Transfer Details (indicate cue type and reason): Pt reports dizziness when she tilts her head backwards.  Discussed recommendation for tub seat.  She is initially resistant, but later decided it might be a good option.  She will talk with family and acquire one on her own  Functional mobility during ADLs: Supervision/safety;Rolling walker       Vision Baseline Vision/History: No visual deficits Patient Visual Report: No change from baseline Vision Assessment?: Yes;No apparent visual deficits     Perception     Praxis      Pertinent Vitals/Pain Pain Assessment: No/denies pain     Hand Dominance Right   Extremity/Trunk Assessment Upper Extremity Assessment Upper Extremity Assessment: Generalized weakness   Lower Extremity Assessment Lower Extremity Assessment: Defer to PT evaluation   Cervical / Trunk Assessment Cervical / Trunk Assessment: Kyphotic   Communication Communication Communication: No difficulties   Cognition Arousal/Alertness: Awake/alert Behavior During Therapy: WFL for tasks assessed/performed Overall Cognitive Status: Within Functional Limits for tasks assessed  General Comments  BP:  supine 120/56 > seated 106/81 >  standing after 3 mins. 117/76 > after activity 131/67 >     Exercises     Shoulder Instructions      Home Living Family/patient expects to be discharged to:: Private residence Living Arrangements: Children;Other  relatives Available Help at Discharge: Family;Available PRN/intermittently Type of Home: House Home Access: Stairs to enter [employer] of Steps: 6 Entrance Stairs-Rails: Right;Left Home Layout: Multi-level;Able to live on main level with bedroom/bathroom     Bathroom Shower/Tub: Teacher, early years/pre: Standard     Home Equipment: Walker - 4 wheels          Prior Functioning/Environment Level of Independence: Independent with assistive device(s)        Comments: Drives, Very active. Swims        OT Problem List: Impaired balance (sitting and/or standing)      OT Treatment/Interventions:      OT Goals(Current goals can be found in the care plan section) Acute Rehab OT Goals Patient Stated Goal: to go home  OT Goal Formulation: All assessment and education complete, DC therapy  OT Frequency:     Barriers to D/C:            Co-evaluation              AM-PAC PT "6 Clicks" Daily Activity     Outcome Measure Help from another person eating meals?: None Help from another person taking care of personal grooming?: A Little Help from another person toileting, which includes using toliet, bedpan, or urinal?: A Little Help from another person bathing (including washing, rinsing, drying)?: A Little Help from another person to put on and taking off regular upper body clothing?: A Little Help from another person to put on and taking off regular lower body clothing?: A Little 6 Click Score: 19   End of Session Equipment Utilized During Treatment: Rolling walker Nurse Communication: Mobility status  Activity Tolerance: Patient tolerated treatment well Patient left: in bed;with call bell/phone within reach;with bed alarm set  OT Visit Diagnosis: Unsteadiness on feet (R26.81)                Time: 1437-1510 OT Time Calculation (min): 33 min Charges:  OT General Charges $OT Visit: 1 Procedure OT Evaluation $OT Eval Low Complexity: 1  Procedure OT Treatments $Self Care/Home Management : 8-22 mins G-Codes:     [facility], OTR/L [phone]   Lucille Passy M 03/17/2017, 4:18 PM

## 2017-03-17 NOTE — Progress Notes (Signed)
PROGRESS NOTE    Kimberly Downs  JEH:[id number] DOB: 02/02/1946 DOA: 03/15/2017 PCP: [name], DO   Brief Narrative: 71 y.o. female with medical history significant for COPD, coronary artery disease with stenting, diabetes mellitus with diet control, hypertension, combined systolic/diastolic CHF, and drug-seeking behavior, now presenting to the emergency department from home for evaluation of shortness of breath and right lateral chest wall pain.  Assessment & Plan:   # Acute on chronic combined systolic and diastolic CHF (congestive heart failure) ([place]): -Patient presented with dyspnea on exertion, pedal edema and nonspecific chest pain. Chest x-ray with probable mild CHF and interstitial edema. BNP elevated to 2545. CT angiogram negative for PE.Continue IV Lasix to 40 mg bid as tolerated by BP. Low-salt diet. Strict ins and outs. -Net negative by 1.4 L -Echocardiogram with reduced EF of 25% with akinesis of the distal left ventricle. Cardio is a consult requested. Continue IV Lasix for now.  #Possible COPD acute exacerbation: Patient is active smoker. Education provided to the patient. -Continue DuoNeb nebulization -dc solumedrol and changed to prednisone 40 mg  #Acute respiratory failure with hypoxia improved. Currently in room air.  #Hypotension: Unknown if it is contributed by oxycodone. Holding Imdur and irberatan-hctz. Plan to continue IV Lasix as tolerated. Blood pressure better improved today. Continue to monitor. Need to resume cardiac medication gradually. Cardiology follow-up. -on verapamil.  #Chest pain: CT chest to rule out PE. Continue to treat for CHF and follow the echo. Chest x-ray negative for acute bony abnormalities. Mild elevation in troponin likely due to CHF.  #Pain management: Education provided to patient. Continue Robaxin and Ultram as needed.  # Normocytic anemia, leukocytosis and thrombocytosis: monitor CBC. Hemoglobin dropped today. No sign of bleeding.  Monitor CBC. Check iron studies.   DVT prophylaxis:lovenox sq Code Status:full Family Communication:no family at bedside Disposition Plan:likely dc home in 1-2 days  Consultants:   Cardiology  Procedures:echo Antimicrobials:none  Subjective: Seen and examined at bedside. Feels mildly better today. Shortness of breath is better. Denied nausea vomiting. The jaws body pain is mildly improved Objective: Vitals:   03/17/17 0035 03/17/17 0608 03/17/17 0815 03/17/17 1216  BP: 127/89 128/63  (!) 110/51  Pulse: 89 92  94  Resp: 18 18  18   Temp: 97.9 F (36.6 C) 98.1 F (36.7 C)  98.2 F (36.8 C)  TempSrc: Oral Oral  Oral  SpO2: 95% 96% 97% 97%  Weight:  52.8 kg (116 lb 6.4 oz)    Height:        Intake/Output Summary (Last 24 hours) at 03/17/17 1341 Last data filed at 03/17/17 1042  Gross per 24 hour  Intake              360 ml  Output             1400 ml  Net            -1040 ml   Filed Weights   03/15/17 2306 03/16/17 0339 03/17/17 3785  Weight: 52.8 kg (116 lb 8 oz) 52.3 kg (115 lb 4.8 oz) 52.8 kg (116 lb 6.4 oz)    Examination:  General exam: Not in distress Respiratory system: Bibasal decreased,nowheezing Cardiovascular system: Regular rate and rhythm, S1 is normal. No pedal edema. Gastrointestinal system: Abdomen soft, nontender, nondistended. Bowel sound positive Central nervous system: Alert and oriented. No focal neurological deficits. Extremities: Symmetric 5 x 5 power. Skin: No rashes, lesions or ulcers Psychiatry: Judgement and insight appear normal. Mood & affect appropriate.  Data Reviewed: I have personally reviewed following labs and imaging studies  CBC:  Recent Labs Lab 03/15/17 1508 03/17/17 0421  WBC 12.4* 9.8  HGB 9.2* 7.8*  HCT 30.2* 26.2*  MCV 81.0 81.1  PLT 446* 222   Basic Metabolic Panel:  Recent Labs Lab 03/15/17 1508 03/16/17 0422 03/17/17 0421  NA 136 137 137  K 3.9 3.8 3.9  CL 104 102 100*  CO2 23 27 29   GLUCOSE  196* 209* 243*  BUN 9 11 24*  CREATININE 1.08* 1.07* 1.13*  CALCIUM 9.3 8.8* 9.1   GFR: Estimated Creatinine Clearance: 33.2 mL/min (A) (by C-G formula based on SCr of 1.13 mg/dL (H)). Liver Function Tests:  Recent Labs Lab 03/15/17 1730  AST 31  ALT 14  ALKPHOS 103  BILITOT 0.6  PROT 6.8  ALBUMIN 3.4*   No results for input(s): LIPASE, AMYLASE in the last 168 hours. No results for input(s): AMMONIA in the last 168 hours. Coagulation Profile: No results for input(s): INR, PROTIME in the last 168 hours. Cardiac Enzymes:  Recent Labs Lab 03/15/17 2303 03/16/17 0422 03/16/17 1014  TROPONINI 0.06* 0.05* 0.03*   BNP (last 3 results) No results for input(s): PROBNP in the last 8760 hours. HbA1C: No results for input(s): HGBA1C in the last 72 hours. CBG:  Recent Labs Lab 03/16/17 1056 03/16/17 1609 03/16/17 2112 03/17/17 0745 03/17/17 1150  GLUCAP 216* 174* 230* 218* 197*   Lipid Profile: No results for input(s): CHOL, HDL, LDLCALC, TRIG, CHOLHDL, LDLDIRECT in the last 72 hours. Thyroid Function Tests: No results for input(s): TSH, T4TOTAL, FREET4, T3FREE, THYROIDAB in the last 72 hours. Anemia Panel: No results for input(s): VITAMINB12, FOLATE, FERRITIN, TIBC, IRON, RETICCTPCT in the last 72 hours. Sepsis Labs: No results for input(s): PROCALCITON, LATICACIDVEN in the last 168 hours.  No results found for this or any previous visit (from the past 240 hour(s)).       Radiology Studies: Dg Chest 2 View  Result Date: 03/15/2017 CLINICAL DATA:  Swelling of the lower extremities. Shortness of breath and chest pain, 4-5 days duration. EXAM: CHEST  2 VIEW COMPARISON:  03/14/2017.  02/14/2017. FINDINGS: The heart is enlarged. There is aortic atherosclerosis. There is pulmonary venous hypertension, probably with early interstitial edema and a small amount of pleural fluid. No focal lesion. No significant bone finding except for chronic increased kyphosis. IMPRESSION:  Probable mild congestive heart failure. Cardiomegaly, venous hypertension, mild interstitial edema and small effusions. Electronically Signed   By: Nelson Chimes M.D.   On: 03/15/2017 15:55   Ct Angio Chest Pe W And/or Wo Contrast  Result Date: 03/15/2017 CLINICAL DATA:  Chest pain and shortness of Breath EXAM: CT ANGIOGRAPHY CHEST WITH CONTRAST TECHNIQUE: Multidetector CT imaging of the chest was performed using the standard protocol during bolus administration of intravenous contrast. Multiplanar CT image reconstructions and MIPs were obtained to evaluate the vascular anatomy. CONTRAST:  100 mL Isovue 370. COMPARISON:  Chest film from earlier in the same day FINDINGS: Cardiovascular: Diffuse aortic calcifications are noted without aneurysmal dilatation. Poor opacification of the aorta is noted. No significant cardiac enlargement is seen. Heavy coronary calcifications are noted. The pulmonary artery shows a normal branching pattern without filling defect to suggest pulmonary embolism. Mediastinum/Nodes: No significant hilar or mediastinal adenopathy is identified. The thoracic inlet is within normal limits. The esophagus is also within normal limits. A small hiatal hernia is noted. Lungs/Pleura: Mild bibasilar atelectatic changes are noted. No focal confluent infiltrate is seen. No  focal nodule is seen. No sizable effusion or pneumothorax is noted. Mild emphysematous changes are noted. Upper Abdomen: Within normal limits. Musculoskeletal: Old bilateral rib fractures are noted with nonunion. Review of the MIP images confirms the above findings. IMPRESSION: No evidence of pulmonary emboli. Mild bibasilar atelectatic changes. No acute abnormality is seen. Aortic Atherosclerosis (ICD10-I70.0) and Emphysema (ICD10-J43.9). Electronically Signed   By: Inez Catalina M.D.   On: 03/15/2017 19:46        Scheduled Meds: . atorvastatin  10 mg Oral QHS  . clopidogrel  75 mg Oral Daily  . docusate sodium  100 mg Oral  Daily  . enoxaparin (LOVENOX) injection  40 mg Subcutaneous Q24H  . furosemide  40 mg Intravenous Q12H  . insulin aspart  0-15 Units Subcutaneous TID WC  . insulin aspart  0-5 Units Subcutaneous QHS  . ipratropium-albuterol  3 mL Nebulization TID  . methocarbamol  500 mg Oral TID  . mometasone-formoterol  2 puff Inhalation BID  . pentoxifylline  400 mg Oral Daily  . predniSONE  40 mg Oral Q breakfast  . rOPINIRole  2 mg Oral QHS  . sodium chloride flush  3 mL Intravenous Q12H  . cyanocobalamin  2,000 mcg Oral Daily   Continuous Infusions: . sodium chloride       LOS: 2 days    Victory Dresden Tanna Furry, MD [facility] Pager [phone]  If 7PM-7AM, please contact night-coverage [email or web address] Password [email or web address] 03/17/2017, 1:41 PM

## 2017-03-17 NOTE — Care Management Note (Signed)
Case Management Note  Patient Details  Name: Kathleena Freeman MRN: [id number] Date of Birth: 01/29/1946  Subjective/Objective:   CHF                Action/Plan: Patient lives at home with family; PCP: [name], DO; has private insurance with Medicare/BCBS with prescription drug coverage; patient would benefit from a Disease Management Program for CHF; [place] choice offered, pt chose [facility]; [place] with [facility] called for arrangements.   Expected Discharge Date:  03/17/17               Expected Discharge Plan:  [place]  Discharge planning Services  CM Consult  Choice offered to:  Patient  HH Arranged:  RN, Disease Management, PT [place] Agency:  [facility] (now [facility])  Status of Service:  In process, will continue to follow  Sherrilyn Rist [phone] 03/17/2017, 2:54 PM
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
## 2017-03-18 ENCOUNTER — Inpatient Hospital Stay (HOSPITAL_COMMUNITY): Payer: Medicare Other

## 2017-03-18 DIAGNOSIS — D500 Iron deficiency anemia secondary to blood loss (chronic): Secondary | ICD-10-CM

## 2017-03-18 DIAGNOSIS — I519 Heart disease, unspecified: Secondary | ICD-10-CM

## 2017-03-18 LAB — BASIC METABOLIC PANEL
Anion gap: 7 (ref 5–15)
BUN: 30 mg/dL — AB (ref 6–20)
CO2: 30 mmol/L (ref 22–32)
Calcium: 8.9 mg/dL (ref 8.9–10.3)
Chloride: 99 mmol/L — ABNORMAL LOW (ref 101–111)
Creatinine, Ser: 1.18 mg/dL — ABNORMAL HIGH (ref 0.44–1.00)
GFR calc Af Amer: 52 mL/min — ABNORMAL LOW (ref >60)
GFR, EST NON AFRICAN AMERICAN: 45 mL/min — AB (ref >60)
GLUCOSE: 167 mg/dL — AB (ref 65–99)
POTASSIUM: 3.6 mmol/L (ref 3.5–5.1)
Sodium: 136 mmol/L (ref 135–145)

## 2017-03-18 LAB — CBC
HEMATOCRIT: 25.8 % — AB (ref 36.0–46.0)
Hemoglobin: 7.7 g/dL — ABNORMAL LOW (ref 12.0–15.0)
MCH: 24 pg — ABNORMAL LOW (ref 26.0–34.0)
MCHC: 29.8 g/dL — AB (ref 30.0–36.0)
MCV: 80.4 fL (ref 78.0–100.0)
Platelets: 380 10*3/uL (ref 150–400)
RBC: 3.21 MIL/uL — ABNORMAL LOW (ref 3.87–5.11)
RDW: 16.8 % — AB (ref 11.5–15.5)
WBC: 14.1 10*3/uL — ABNORMAL HIGH (ref 4.0–10.5)

## 2017-03-18 LAB — GLUCOSE, CAPILLARY
GLUCOSE-CAPILLARY: 160 mg/dL — AB (ref 65–99)
GLUCOSE-CAPILLARY: 90 mg/dL (ref 65–99)
Glucose-Capillary: 129 mg/dL — ABNORMAL HIGH (ref 65–99)
Glucose-Capillary: 175 mg/dL — ABNORMAL HIGH (ref 65–99)

## 2017-03-18 LAB — PROTIME-INR
INR: 0.97
Prothrombin Time: 12.9 seconds (ref 11.4–15.2)

## 2017-03-18 LAB — ABO/RH: ABO/RH(D): A NEG

## 2017-03-18 LAB — PREPARE RBC (CROSSMATCH)

## 2017-03-18 MED ORDER — SODIUM CHLORIDE 0.9 % IV SOLN: Freq: Once | INTRAVENOUS | Status: DC

## 2017-03-18 MED ORDER — SODIUM CHLORIDE 0.9% FLUSH
10.0000 mL | INTRAVENOUS | Status: DC | PRN
  Administered 2017-03-19 – 2017-03-21 (×2): 10 mL
  Filled 2017-03-18 (×2): qty 40

## 2017-03-18 MED ORDER — CARVEDILOL 3.125 MG PO TABS: 3.1250 mg | ORAL_TABLET | Freq: Two times a day (BID) | ORAL | Status: DC

## 2017-03-18 MED ORDER — POLYETHYLENE GLYCOL 3350 17 G PO PACK
17.0000 g | PACK | Freq: Every day | ORAL | Status: DC
  Administered 2017-03-18 – 2017-03-22 (×5): 17 g via ORAL
  Filled 2017-03-18 (×5): qty 1

## 2017-03-18 MED ORDER — OXYCODONE HCL 5 MG PO TABS
5.0000 mg | ORAL_TABLET | ORAL | Status: DC | PRN
  Administered 2017-03-18 – 2017-03-20 (×10): 5 mg via ORAL
  Filled 2017-03-18 (×10): qty 1

## 2017-03-18 MED ORDER — FUROSEMIDE 10 MG/ML IJ SOLN
40.0000 mg | Freq: Two times a day (BID) | INTRAMUSCULAR | Status: AC
  Administered 2017-03-18: 40 mg via INTRAVENOUS
  Filled 2017-03-18 (×2): qty 4

## 2017-03-18 MED ORDER — PANTOPRAZOLE SODIUM 40 MG PO TBEC
40.0000 mg | DELAYED_RELEASE_TABLET | Freq: Every day | ORAL | Status: DC
  Administered 2017-03-18 – 2017-03-22 (×5): 40 mg via ORAL
  Filled 2017-03-18 (×5): qty 1

## 2017-03-18 MED ORDER — SENNOSIDES-DOCUSATE SODIUM 8.6-50 MG PO TABS
1.0000 | ORAL_TABLET | Freq: Two times a day (BID) | ORAL | Status: DC
  Administered 2017-03-18 – 2017-03-22 (×9): 1 via ORAL
  Filled 2017-03-18 (×9): qty 1

## 2017-03-18 MED ORDER — CARVEDILOL 3.125 MG PO TABS
3.1250 mg | ORAL_TABLET | Freq: Two times a day (BID) | ORAL | Status: DC
  Administered 2017-03-18 – 2017-03-22 (×9): 3.125 mg via ORAL
  Filled 2017-03-18 (×9): qty 1
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
## 2017-03-18 NOTE — Progress Notes (Signed)
Progress Note  Patient Name: Kimberly Downs Date of Encounter: 03/18/2017  Primary Cardiologist: Dr. Gwenlyn Found  Subjective   SOB has improved. Her main complain is chest pain that band round her chest and goes to her back. Asking for pain medications.   Inpatient Medications    Scheduled Meds: . atorvastatin  10 mg Oral QHS  . clopidogrel  75 mg Oral Daily  . docusate sodium  100 mg Oral Daily  . enoxaparin (LOVENOX) injection  40 mg Subcutaneous Q24H  . furosemide  40 mg Intravenous Q12H  . insulin aspart  0-15 Units Subcutaneous TID WC  . insulin aspart  0-5 Units Subcutaneous QHS  . ipratropium-albuterol  3 mL Nebulization TID  . methocarbamol  500 mg Oral TID  . mometasone-formoterol  2 puff Inhalation BID  . pentoxifylline  400 mg Oral Daily  . predniSONE  40 mg Oral Q breakfast  . rOPINIRole  2 mg Oral QHS  . sodium chloride flush  3 mL Intravenous Q12H  . sodium chloride flush  3 mL Intravenous Q12H  . cyanocobalamin  2,000 mcg Oral Daily   Continuous Infusions: . sodium chloride    . sodium chloride    . sodium chloride 1 mL/kg/hr (03/18/17 0740)   PRN Meds: sodium chloride, sodium chloride, acetaminophen, ondansetron (ZOFRAN) IV, pantoprazole, sodium chloride flush, sodium chloride flush, traMADol   Vital Signs    Vitals:   03/17/17 2039 03/17/17 2100 03/18/17 0213 03/18/17 0545  BP:  115/66  123/68  Pulse:  84  88  Resp:  18  18  Temp:  97.7 F (36.5 C)  97.9 F (36.6 C)  TempSrc:  Oral  Oral  SpO2: 98% 98%  99%  Weight:   115 lb 12.8 oz (52.5 kg)   Height:        Intake/Output Summary (Last 24 hours) at 03/18/17 0755 Last data filed at 03/18/17 0746  Gross per 24 hour  Intake           695.04 ml  Output             1900 ml  Net         -1204.96 ml   Filed Weights   03/16/17 0339 03/17/17 0608 03/18/17 0213  Weight: 115 lb 4.8 oz (52.3 kg) 116 lb 6.4 oz (52.8 kg) 115 lb 12.8 oz (52.5 kg)    Telemetry    SR - Personally Reviewed  ECG      None today   Physical Exam   GEN: thin frail female in no acute distress.   Neck: No JVD Cardiac: RRR, no murmurs, rubs, or gallops.  Respiratory: Clear to auscultation bilaterally. GI: Soft, nontender, non-distended  MS: No edema; No deformity. Neuro:  Nonfocal  Psych: Normal affect   Labs    Chemistry Recent Labs Lab 03/15/17 1730 03/16/17 0422 03/17/17 0421 03/18/17 0355  NA  --  137 137 136  K  --  3.8 3.9 3.6  CL  --  102 100* 99*  CO2  --  27 29 30   GLUCOSE  --  209* 243* 167*  BUN  --  11 24* 30*  CREATININE  --  1.07* 1.13* 1.18*  CALCIUM  --  8.8* 9.1 8.9  PROT 6.8  --   --   --   ALBUMIN 3.4*  --   --   --   AST 31  --   --   --   ALT 14  --   --   --  ALKPHOS 103  --   --   --   BILITOT 0.6  --   --   --   GFRNONAA  --  51* 47* 45*  GFRAA  --  59* 55* 52*  ANIONGAP  --  8 8 7      Hematology Recent Labs Lab 03/15/17 1508 03/17/17 0421 03/18/17 0355  WBC 12.4* 9.8 14.1*  RBC 3.73* 3.23* 3.21*  HGB 9.2* 7.8* 7.7*  HCT 30.2* 26.2* 25.8*  MCV 81.0 81.1 80.4  MCH 24.7* 24.1* 24.0*  MCHC 30.5 29.8* 29.8*  RDW 17.2* 16.5* 16.8*  PLT 446* 373 380    Cardiac Enzymes Recent Labs Lab 03/15/17 2303 03/16/17 0422 03/16/17 1014  TROPONINI 0.06* 0.05* 0.03*    Recent Labs Lab 03/15/17 1517  TROPIPOC 0.05     BNP Recent Labs Lab 03/15/17 1717  BNP 2,545.7*     DDimer  Recent Labs Lab 03/15/17 1717  DDIMER 1.61*     Radiology    No results found.  Cardiac Studies   Echo 03/16/17 Study Conclusions  - Left ventricle: LVEF is approximately 25% with akinesis of the   distal 1/3 to1/2 of the ventricle. The cavity size was mildly   dilated. Wall thickness was normal. Features are consistent with   a pseudonormal left ventricular filling pattern, with concomitant   abnormal relaxation and increased filling pressure (grade 2   diastolic dysfunction). - Left atrium: The atrium was mildly dilated.  Patient Profile     71 y.o.  female obesity, smoker (50-75 pack years), hyperlipidemia, COPD, CAD with hx of stenting, diabetes managed with diet, hypertension, combined sys/diastolic heart failure, claudication, PAF, anemia and drug-seeking behavior  presented to the ER on 03/15/17 with complaints of SOB for the past few days with occasional cough.  She was admitted 01/26/17 with COPD exacerbation, CAP, afib with RVR, acute respiratory failure w/hypoxia and acute anemia with possible GI bleed (+ FOBT). EGD showed nonbleeding gastric ulcers with clean ulcer base, non-bleeding erosive duodenum. Hold ASA and plavix.    Assessment & Plan    1. Acute on chronic combined CHF - BNP > 2500. Echo this admission showed LVEF of 25% (LVEF of 40-45% 12/2016) with with akinesis of the distal 1/3 to1/2 of the ventricle - net I & O negative 2.5L. Weight down 1 lb. She is euvolemic. BUN/SCr increased to 30/1.18 from 24/1.13. Will hold IV lasix. Re assess tomorrow.  - Hold cath today given anemia. Likely R and L cath after GI clearance and hgb around 10. See below. EKG with inferior lateral TWI. Concern for LV thrombus and ICM.  - Stop lasix. Start Coreg 3.125mg  BID. Up titrate as BP tolerates.   2. Anemia -Recent   EGD showed nonbleeding gastric ulcers with clean ulcer base, non-bleeding erosive duodenum. Hold ASA and plavix.  - Currently only on plavix.continue. - Transfuse 2 units (4 hours each). Give IV lasix 40 after each transfusion. Consider GI evaluation.   3. PAF - in setting of respiratory failure last admission 01/2017. CHADSVASC score of 5 (age, sex, CHF, HTN and DM). Will review anticoagulation after GI work up and cath.   4. HTN - Stable. As above.   5. Elevated D-dimer - No PE on CTA.   6. DM - Per primary   7. 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 She has had recent admission for GI bleed with negative endoscopy. She presented earlier this week with shortness of breath and 2-D echo was notable for significant reduction in ejection fraction in the 20-25% range compared to prior echo performed 2 months ago that was in the 40-45% range. She does completely atypical chest pain which is constant and circumferential. She has remained on Aspirin and  Plavix since her coronary stent was implanted in the [place] 8 years ago. She has adequately diuresed to a half liters since admission and appears euvolemic at this time. She does have a hemoglobin of 7.7 for unclear reasons. I am going to transfuse her 2 units of packed red blood cells. GI to further evaluate her and consider performing colonoscopy. She was initially initially scheduled for right and  left heart cath today which I'm going to postpone until early next week.  Lorretta Harp, M.D., [place], [facility], [place] [facility] [address] [place], [place]  [address]  [phone] 03/18/2017 8:32 AM

## 2017-03-18 NOTE — Progress Notes (Signed)
Physical Therapy Treatment Patient Details Name: Geneve Kimpel MRN: [id number] DOB: 1946/04/03 Today's Date: 03/18/2017    History of Present Illness 71 y.o. female with onset of CHF and cardiomegaly, flat elevated troponin, atelectasis and increased LE edema was admitted.  Has tachycardia, drops in BP, pulm effusion.  PMHx:  ASCVD, stent, AA angiogram, DM, HTN, HLD, COPD, bronchitis, a-fib, respiratory failure with intubation, emphysema,    PT Comments    Pt admitted with above diagnosis. Pt currently with functional limitations due to balance and endurance deficits. Pt was able to ambulate in hallways with RW with good stability and VSS.  No issues today with good safety overall.  Will continue PT as pt tolerates.  She is c/o slight dizziness but it did not limit pt.  Pt will benefit from skilled PT to increase their independence and safety with mobility to allow discharge to the venue listed below.     Follow Up Recommendations  Home health PT;Supervision for mobility/OOB     Equipment Recommendations  Rolling walker with 5" wheels (if pt does not have a walker in good repair)    Recommendations for Other Services       Precautions / Restrictions Precautions Precautions: Fall Precaution Comments: Pt reports several falls after last admission  Restrictions Weight Bearing Restrictions: No    Mobility  Bed Mobility Overal bed mobility: Independent Bed Mobility: Supine to Sit;Sit to Supine     Supine to sit: Independent Sit to supine: Independent      Transfers Overall transfer level: Needs assistance Equipment used: Rolling walker (2 wheeled);1 person hand held assist Transfers: Sit to/from [facility] Sit to Stand: Supervision Stand pivot transfers: Supervision       General transfer comment: pt can use her hands on bed to stand but needs reminders for sequence  Ambulation/Gait Ambulation/Gait assistance: Min guard Ambulation Distance (Feet): 250  Feet Assistive device: Rolling walker (2 wheeled);1 person hand held assist Gait Pattern/deviations: Step-through pattern;Decreased stride length;Narrow base of support Gait velocity: reduced Gait velocity interpretation: Below normal speed for age/gender General Gait Details: Pt overall safe with RW in hallway.  No LOB wtih min challenges.  Pt was receiving blood but had not issues.     Stairs            Wheelchair Mobility    Modified Rankin (Stroke Patients Only)       Balance Overall balance assessment: Needs assistance Sitting-balance support: Feet supported Sitting balance-Leahy Scale: Good     Standing balance support: During functional activity;No upper extremity supported Standing balance-Leahy Scale: Fair Standing balance comment: can stand statically without UE support                            Cognition Arousal/Alertness: Awake/alert Behavior During Therapy: WFL for tasks assessed/performed Overall Cognitive Status: Within Functional Limits for tasks assessed                                        Exercises      General Comments General comments (skin integrity, edema, etc.): VSS with 93% on RA, 96/79, 87 bpm.       Pertinent Vitals/Pain Pain Assessment: No/denies pain    Home Living                      Prior Function  PT Goals (current goals can now be found in the care plan section) Progress towards PT goals: Progressing toward goals    Frequency    Min 3X/week      PT Plan Current plan remains appropriate    Co-evaluation              AM-PAC PT "6 Clicks" Daily Activity  Outcome Measure  Difficulty turning over in bed (including adjusting bedclothes, sheets and blankets)?: None Difficulty moving from lying on back to sitting on the side of the bed? : None Difficulty sitting down on and standing up from a chair with arms (e.g., wheelchair, bedside commode, etc,.)?:  None Help needed moving to and from a bed to chair (including a wheelchair)?: A Little Help needed walking in hospital room?: A Little Help needed climbing 3-5 steps with a railing? : A Little 6 Click Score: 21    End of Session Equipment Utilized During Treatment: Gait belt Activity Tolerance: Patient tolerated treatment well Patient left: in bed;with call bell/phone within reach;with bed alarm set Nurse Communication: Mobility status PT Visit Diagnosis: Unsteadiness on feet (R26.81);Muscle weakness (generalized) (M62.81)     Time: 6734-1937 PT Time Calculation (min) (ACUTE ONLY): 16 min  Charges:  $Gait Training: 8-22 mins                    G Codes:       Oliwia Berzins,PT Acute Rehabilitation [phone] (pager)    Denice Paradise 03/18/2017, 4:12 PM

## 2017-03-18 NOTE — Care Management Important Message (Signed)
Important Message  Patient Details  Name: Tristyn Pharris MRN: [id number] Date of Birth: 1946-01-05   Medicare Important Message Given:  Yes    Vonzella Althaus Abena 03/18/2017, 10:20 AM

## 2017-03-18 NOTE — Progress Notes (Signed)
Pt ambulating with PT as blood is transfusing, pt denies complaints. Pt appears to be comfortable, reported feeling great and pausing lunch to walk.

## 2017-03-18 NOTE — Progress Notes (Signed)
Peripherally Inserted Central Catheter/Midline Placement  The IV Nurse has discussed with the patient and/or persons authorized to consent for the patient, the purpose of this procedure and the potential benefits and risks involved with this procedure.  The benefits include less needle sticks, lab draws from the catheter, and the patient may be discharged home with the catheter. Risks include, but not limited to, infection, bleeding, blood clot (thrombus formation), and puncture of an artery; nerve damage and irregular heartbeat and possibility to perform a PICC exchange if needed/ordered by physician.  Alternatives to this procedure were also discussed.  B[employer] PICC patient education guide, fact sheet on infection prevention and patient information card has been provided to patient /or left at bedside.    PICC/Midline Placement Documentation        Kimberly Downs 03/18/2017, 1:58 PM

## 2017-03-18 NOTE — Progress Notes (Signed)
3e12 pt could benefit from PICC line. please confirm coreg/lasix order changes. can we d/c lovenox and order SCDs for VTE?   Page to [email or web address]

## 2017-03-18 NOTE — Progress Notes (Addendum)
PROGRESS NOTE    Kimberly Downs  XLK:[id number] DOB: 1945-12-13 DOA: 03/15/2017 PCP: [name], DO   Brief Narrative: 71 y.o. female with medical history significant for COPD, coronary artery disease with stenting, diabetes mellitus with diet control, hypertension, combined systolic/diastolic CHF, and drug-seeking behavior, now presenting to the emergency department from home for evaluation of shortness of breath and right lateral chest wall pain.  Assessment & Plan:   # Acute on chronic combined systolic and diastolic CHF (congestive heart failure) ([place]): -Patient presented with dyspnea on exertion, pedal edema and nonspecific chest pain. Chest x-ray with probable mild CHF and interstitial edema. BNP elevated to 2545. CT angiogram negative for PE -Continue IV Lasix to 40 mg bid as tolerated by BP. Low-salt diet. Strict ins and outs. Monitor BMP. -Net negative by 2.5 L -Echocardiogram with reduced EF of 25% with akinesis of the distal left ventricle.  -Cardiology consult appreciated, planning for cardiac cath tomorrow. -Resume Coreg  # Normocytic anemia: concerning for GI bleed. Patient recently had upper GI endoscopy with gastric ulcer with active bleeding. Plan for 2 units of red blood cell transfusion today. GI consult requested to [facility]. Patient is on  Plavix. -Start Protonix oral. Check fecal occult blood test -Monitor CBC. -picc line for good IV access  #Possible COPD acute exacerbation: Patient is active smoker. Education provided to the patient. -Continue DuoNeb nebulization -dc solumedrol and changed to prednisone 40 mg (day 2)  #Acute respiratory failure with hypoxia improved. Currently in room air.  #Hypotension: Blood pressure improved. Currently on coreg, Lasix. Continue to monitor.  #Chest pain: CT chest to rule out PE. Continue to treat for CHF and follow the echo. Chest x-ray negative for acute bony abnormalities. Mild elevation in troponin likely due to  CHF. - #Pain management: Education provided to patient. Continue Robaxin and Ultram as needed. Added oxycodone for better pain management. Bowel regimen with MiraLAX and Senokot.  # leukocytosis and thrombocytosis: monitor CBC. Leukocytosis likely due to steroid. Platelet count is improving.   DVT prophylaxis: Discontinue Lovenox because of anemia and started SCDs. Code Status:full Family Communication:no family at bedside Disposition Plan:likely dc home in 1-2 days  Consultants:   Cardiology  [facility]  Procedures:echo Antimicrobials:none  Subjective: Seen and examined at bedside. Still complaining of generalized body pain mostly around chest and abdomen. Denied shortness of breath, nausea or vomiting. No headache or dizziness. Objective: Vitals:   03/17/17 2039 03/17/17 2100 03/18/17 0213 03/18/17 0545  BP:  115/66  123/68  Pulse:  84  88  Resp:  18  18  Temp:  97.7 F (36.5 C)  97.9 F (36.6 C)  TempSrc:  Oral  Oral  SpO2: 98% 98%  99%  Weight:   52.5 kg (115 lb 12.8 oz)   Height:        Intake/Output Summary (Last 24 hours) at 03/18/17 1019 Last data filed at 03/18/17 0746  Gross per 24 hour  Intake           575.04 ml  Output             1800 ml  Net         -1224.96 ml   Filed Weights   03/16/17 0339 03/17/17 0608 03/18/17 0213  Weight: 52.3 kg (115 lb 4.8 oz) 52.8 kg (116 lb 6.4 oz) 52.5 kg (115 lb 12.8 oz)    Examination:  General exam: Not in distress, sitting on bed comfortable Respiratory system: Bilateral clear, no wheezing  Cardiovascular system: Regular rate rhythm, S1-S2 normal. No pedal edema. Gastrointestinal system: Abdomen soft, nontender, nondistended. [place] positive Central nervous system: Alert awake and oriented. No focal neurological deficit. Extremities: Symmetric 5 x 5 power. Skin: No rashes, lesions or ulcers Psychiatry: Judgement and insight appear normal. Mood & affect appropriate.     Data Reviewed: I have personally  reviewed following labs and imaging studies  CBC:  Recent Labs Lab 03/15/17 1508 03/17/17 0421 03/18/17 0355  WBC 12.4* 9.8 14.1*  HGB 9.2* 7.8* 7.7*  HCT 30.2* 26.2* 25.8*  MCV 81.0 81.1 80.4  PLT 446* 373 209   Basic Metabolic Panel:  Recent Labs Lab 03/15/17 1508 03/16/17 0422 03/17/17 0421 03/18/17 0355  NA 136 137 137 136  K 3.9 3.8 3.9 3.6  CL 104 102 100* 99*  CO2 23 27 29 30   GLUCOSE 196* 209* 243* 167*  BUN 9 11 24* 30*  CREATININE 1.08* 1.07* 1.13* 1.18*  CALCIUM 9.3 8.8* 9.1 8.9   GFR: Estimated Creatinine Clearance: 31.8 mL/min (A) (by C-G formula based on SCr of 1.18 mg/dL (H)). Liver Function Tests:  Recent Labs Lab 03/15/17 1730  AST 31  ALT 14  ALKPHOS 103  BILITOT 0.6  PROT 6.8  ALBUMIN 3.4*   No results for input(s): LIPASE, AMYLASE in the last 168 hours. No results for input(s): AMMONIA in the last 168 hours. Coagulation Profile:  Recent Labs Lab 03/18/17 0355  INR 0.97   Cardiac Enzymes:  Recent Labs Lab 03/15/17 2303 03/16/17 0422 03/16/17 1014  TROPONINI 0.06* 0.05* 0.03*   BNP (last 3 results) No results for input(s): PROBNP in the last 8760 hours. HbA1C: No results for input(s): HGBA1C in the last 72 hours. CBG:  Recent Labs Lab 03/17/17 0745 03/17/17 1150 03/17/17 1649 03/17/17 2046 03/18/17 0730  GLUCAP 218* 197* 164* 163* 129*   Lipid Profile: No results for input(s): CHOL, HDL, LDLCALC, TRIG, CHOLHDL, LDLDIRECT in the last 72 hours. Thyroid Function Tests: No results for input(s): TSH, T4TOTAL, FREET4, T3FREE, THYROIDAB in the last 72 hours. Anemia Panel:  Recent Labs  03/17/17 1450  FERRITIN 38  TIBC 469*  IRON 13*   Sepsis Labs: No results for input(s): PROCALCITON, LATICACIDVEN in the last 168 hours.  No results found for this or any previous visit (from the past 240 hour(s)).       Radiology Studies: No results found.      Scheduled Meds: . atorvastatin  10 mg Oral QHS  .  carvedilol  3.125 mg Oral BID WC  . clopidogrel  75 mg Oral Daily  . docusate sodium  100 mg Oral Daily  . furosemide  40 mg Intravenous BID  . insulin aspart  0-15 Units Subcutaneous TID WC  . insulin aspart  0-5 Units Subcutaneous QHS  . ipratropium-albuterol  3 mL Nebulization TID  . methocarbamol  500 mg Oral TID  . mometasone-formoterol  2 puff Inhalation BID  . pentoxifylline  400 mg Oral Daily  . polyethylene glycol  17 g Oral Daily  . predniSONE  40 mg Oral Q breakfast  . rOPINIRole  2 mg Oral QHS  . senna-docusate  1 tablet Oral BID  . sodium chloride flush  3 mL Intravenous Q12H  . sodium chloride flush  3 mL Intravenous Q12H  . cyanocobalamin  2,000 mcg Oral Daily   Continuous Infusions: . sodium chloride    . sodium chloride    . sodium chloride 10 mL/hr at 03/18/17 4709  LOS: 3 days    Dron Tanna Furry, MD [facility] Pager [phone]  If 7PM-7AM, please contact night-coverage [email or web address] Password [email or web address] 03/18/2017, 10:19 AM

## 2017-03-19 LAB — BPAM RBC
BLOOD PRODUCT EXPIRATION DATE: 201808142359
BLOOD PRODUCT EXPIRATION DATE: 201808142359
ISSUE DATE / TIME: 201807201614
ISSUE DATE / TIME: 201807201207
UNIT TYPE AND RH: 600
Unit Type and Rh: 600

## 2017-03-19 LAB — BASIC METABOLIC PANEL
ANION GAP: 8 (ref 5–15)
BUN: 28 mg/dL — ABNORMAL HIGH (ref 6–20)
CHLORIDE: 97 mmol/L — AB (ref 101–111)
CO2: 32 mmol/L (ref 22–32)
Calcium: 9 mg/dL (ref 8.9–10.3)
Creatinine, Ser: 1.04 mg/dL — ABNORMAL HIGH (ref 0.44–1.00)
GFR calc Af Amer: >60 mL/min (ref >60)
GFR calc non Af Amer: 52 mL/min — ABNORMAL LOW (ref >60)
Glucose, Bld: 114 mg/dL — ABNORMAL HIGH (ref 65–99)
Potassium: 3.5 mmol/L (ref 3.5–5.1)
SODIUM: 137 mmol/L (ref 135–145)

## 2017-03-19 LAB — CBC
HEMATOCRIT: 33.3 % — AB (ref 36.0–46.0)
Hemoglobin: 10.4 g/dL — ABNORMAL LOW (ref 12.0–15.0)
MCH: 25.6 pg — AB (ref 26.0–34.0)
MCHC: 31.2 g/dL (ref 30.0–36.0)
MCV: 82 fL (ref 78.0–100.0)
PLATELETS: 326 10*3/uL (ref 150–400)
RBC: 4.06 MIL/uL (ref 3.87–5.11)
RDW: 16.4 % — ABNORMAL HIGH (ref 11.5–15.5)
WBC: 9.6 10*3/uL (ref 4.0–10.5)

## 2017-03-19 LAB — GLUCOSE, CAPILLARY
GLUCOSE-CAPILLARY: 218 mg/dL — AB (ref 65–99)
GLUCOSE-CAPILLARY: 70 mg/dL (ref 65–99)
Glucose-Capillary: 102 mg/dL — ABNORMAL HIGH (ref 65–99)
Glucose-Capillary: 261 mg/dL — ABNORMAL HIGH (ref 65–99)

## 2017-03-19 LAB — TYPE AND SCREEN
ABO/RH(D): A NEG
ANTIBODY SCREEN: NEGATIVE
UNIT DIVISION: 0
UNIT DIVISION: 0

## 2017-03-19 MED ORDER — FUROSEMIDE 20 MG PO TABS
20.0000 mg | ORAL_TABLET | Freq: Every day | ORAL | Status: DC
  Administered 2017-03-19 – 2017-03-22 (×4): 20 mg via ORAL
  Filled 2017-03-19 (×4): qty 1

## 2017-03-19 MED ORDER — FUROSEMIDE 10 MG/ML IJ SOLN
40.0000 mg | Freq: Every day | INTRAMUSCULAR | Status: DC
  Filled 2017-03-19: qty 4

## 2017-03-19 NOTE — Progress Notes (Signed)
Progress Note  Patient Name: Kimberly Downs Date of Encounter: 03/19/2017  Primary Cardiologist: Dr. Gwenlyn Found  Subjective   Feeling improved since blood transfusion. Continuing to have mild chest pain on the left side of her chest. Feels like a pressure like pain. Pain is constant.  Inpatient Medications    Scheduled Meds: . atorvastatin  10 mg Oral QHS  . carvedilol  3.125 mg Oral BID WC  . clopidogrel  75 mg Oral Daily  . docusate sodium  100 mg Oral Daily  . furosemide  40 mg Intravenous Daily  . insulin aspart  0-15 Units Subcutaneous TID WC  . insulin aspart  0-5 Units Subcutaneous QHS  . ipratropium-albuterol  3 mL Nebulization TID  . methocarbamol  500 mg Oral TID  . mometasone-formoterol  2 puff Inhalation BID  . pantoprazole  40 mg Oral Daily  . pentoxifylline  400 mg Oral Daily  . polyethylene glycol  17 g Oral Daily  . rOPINIRole  2 mg Oral QHS  . senna-docusate  1 tablet Oral BID  . sodium chloride flush  3 mL Intravenous Q12H  . sodium chloride flush  3 mL Intravenous Q12H  . cyanocobalamin  2,000 mcg Oral Daily   Continuous Infusions: . sodium chloride    . sodium chloride    . sodium chloride 10 mL/hr at 03/18/17 1709   PRN Meds: sodium chloride, sodium chloride, acetaminophen, ondansetron (ZOFRAN) IV, oxyCODONE, sodium chloride flush, sodium chloride flush, sodium chloride flush, traMADol   Vital Signs    Vitals:   03/18/17 2109 03/19/17 0529 03/19/17 0729 03/19/17 0730  BP: 133/75 121/71    Pulse: 79 78    Resp: 18 17    Temp: 97.8 F (36.6 C) 97.9 F (36.6 C)    TempSrc: Oral Oral    SpO2: 97% 97% 98% 98%  Weight:  117 lb (53.1 kg)    Height:        Intake/Output Summary (Last 24 hours) at 03/19/17 1022 Last data filed at 03/19/17 0530  Gross per 24 hour  Intake          2251.58 ml  Output             2800 ml  Net          -548.42 ml   Filed Weights   03/17/17 0608 03/18/17 0213 03/19/17 0529  Weight: 116 lb 6.4 oz (52.8 kg) 115 lb  12.8 oz (52.5 kg) 117 lb (53.1 kg)    Telemetry    SR - Personally Reviewed  ECG    None today   Physical Exam   GEN: Well nourished, well developed, in no acute distress  HEENT: normal  Neck: no JVD, carotid bruits, or masses Cardiac: RRR; no murmurs, rubs, or gallops,no edema  Respiratory:  Mild bibasilar crackles bilaterally, normal work of breathing GI: soft, nontender, nondistended, + BS MS: no deformity or atrophy  Skin: warm and dry Neuro:  Strength and sensation are intact Psych: euthymic mood, full affect   Labs    Chemistry Recent Labs Lab 03/15/17 1730  03/17/17 0421 03/18/17 0355 03/19/17 0440  NA  --   < > 137 136 137  K  --   < > 3.9 3.6 3.5  CL  --   < > 100* 99* 97*  CO2  --   < > 29 30 32  GLUCOSE  --   < > 243* 167* 114*  BUN  --   < > 24*  30* 28*  CREATININE  --   < > 1.13* 1.18* 1.04*  CALCIUM  --   < > 9.1 8.9 9.0  PROT 6.8  --   --   --   --   ALBUMIN 3.4*  --   --   --   --   AST 31  --   --   --   --   ALT 14  --   --   --   --   ALKPHOS 103  --   --   --   --   BILITOT 0.6  --   --   --   --   GFRNONAA  --   < > 47* 45* 52*  GFRAA  --   < > 55* 52* >60  ANIONGAP  --   < > 8 7 8   < > = values in this interval not displayed.   Hematology  Recent Labs Lab 03/17/17 0421 03/18/17 0355 03/19/17 0440  WBC 9.8 14.1* 9.6  RBC 3.23* 3.21* 4.06  HGB 7.8* 7.7* 10.4*  HCT 26.2* 25.8* 33.3*  MCV 81.1 80.4 82.0  MCH 24.1* 24.0* 25.6*  MCHC 29.8* 29.8* 31.2  RDW 16.5* 16.8* 16.4*  PLT 373 380 326    Cardiac Enzymes  Recent Labs Lab 03/15/17 2303 03/16/17 0422 03/16/17 1014  TROPONINI 0.06* 0.05* 0.03*     Recent Labs Lab 03/15/17 1517  TROPIPOC 0.05     BNP  Recent Labs Lab 03/15/17 1717  BNP 2,545.7*     DDimer   Recent Labs Lab 03/15/17 1717  DDIMER 1.61*     Radiology    No results found.  Cardiac Studies   Echo 03/16/17 Study Conclusions  - Left ventricle: LVEF is approximately 25% with  akinesis of the   distal 1/3 to1/2 of the ventricle. The cavity size was mildly   dilated. Wall thickness was normal. Features are consistent with   a pseudonormal left ventricular filling pattern, with concomitant   abnormal relaxation and increased filling pressure (grade 2   diastolic dysfunction). - Left atrium: The atrium was mildly dilated.  Patient Profile     71 y.o. female obesity, smoker (50-75 pack years), hyperlipidemia, COPD, CAD with hx of stenting, diabetes managed with diet, hypertension, combined sys/diastolic heart failure, claudication, PAF, anemia and drug-seeking behavior  presented to the ER on 03/15/17 with complaints of SOB for the past few days with occasional cough.  She was admitted 01/26/17 with COPD exacerbation, CAP, afib with RVR, acute respiratory failure w/hypoxia and acute anemia with possible GI bleed (+ FOBT). EGD showed nonbleeding gastric ulcers with clean ulcer base, non-bleeding erosive duodenum. Hold ASA and plavix.    Assessment & Plan    1. Acute on chronic combined CHF Continues to have shortness of breath with exertion. We'll try her on Lasix today as her creatinine has improved. Net -3.3 L since admission. Plan for right and left heart catheterization per Dr. Gwenlyn Found to further define coronary anatomy and right heart pressures. If hemoglobin remains stable, catheterization for Monday.  2. Anemia Clean ulcer found on EGD. Status post 2 units of red blood cells. Hemoglobin stable since transfusion.  3. PAF [facility] 5. Atrial fibrillation the time of her respiratory illness last admission. May need anticoagulation post catheterization.  4. HTN Blood pressure stable  5. Elevated D-dimer No PE found on CTA  6. DM Per primary    7. HLD - 04/04/2016: Cholesterol 131; HDL 57; LDL  Cholesterol 66; Triglycerides 39; VLDL 8  Continue statin  Signed, Chas Axel Meredith Leeds, MD  03/19/2017, 10:22 AM

## 2017-03-19 NOTE — Progress Notes (Addendum)
PROGRESS NOTE    Kimberly Downs  ATF:[id number] DOB: 1946/02/01 DOA: 03/15/2017 PCP: [name], DO   Brief Narrative: 71 y.o. female with medical history significant for COPD, coronary artery disease with stenting, diabetes mellitus with diet control, hypertension, combined systolic/diastolic CHF, and drug-seeking behavior, now presenting to the emergency department from home for evaluation of shortness of breath and right lateral chest wall pain.  Assessment & Plan:   # Acute on chronic combined systolic and diastolic CHF (congestive heart failure) ([place]): -Patient presented with dyspnea on exertion, pedal edema and nonspecific chest pain. Chest x-ray with probable mild CHF and interstitial edema. BNP elevated to 2545. CT angiogram negative for PE --Net negative by 3.3 L and patient is able to lie flat. Lower lasix to 40 mg IV daily, strict I/O and low salt diet. -Echocardiogram with reduced EF of 25% with akinesis of the distal left ventricle.  -Cardiology consult appreciated, waiting for cardiac catheterization.. -Resume Coreg  # Normocytic anemia: concerning for GI bleed. Patient recently had upper GI endoscopy with gastric ulcer with active bleeding.  -Received 2 units of red blood cell transfusion with stable hemoglobin. Patient denied any bleeding. Evaluated by GI recommended no endoscopy evaluation at this time. Continue Protonix and supportive care. Monitor CBC.  #Possible COPD acute exacerbation: Patient is active smoker. Education provided to the patient. -Continue DuoNeb nebulization -Discontinue prednisone today. Clinically stable in room air.  #Acute respiratory failure with hypoxia improved. Currently in room air.  #Hypotension: Blood pressure improved. Currently on coreg, Lasix. Continue to monitor.  #Chest pain: CT chest to rule out PE. Continue to treat for CHF and follow the echo. Chest x-ray negative for acute bony abnormalities. Mild elevation in troponin likely due  to CHF.  #Pain management: Education provided to patient. Patient is controlled with current regimen. Bowel regimen with MiraLAX and Senokot.  # leukocytosis and thrombocytosis: monitor CBC. CBC improved.   DVT prophylaxis: Discontinue Lovenox because of anemia and started SCDs. Encourage ambulation. Code Status:full Family Communication:no family at bedside Disposition Plan:likely dc home in 1-2 days  Consultants:   Cardiology  [facility]  Procedures:echo Antimicrobials:none  Subjective: Patient is improving with the current medication. Denied chest pain, shortness of breath, nausea or vomiting. Objective: Vitals:   03/18/17 2109 03/19/17 0529 03/19/17 0729 03/19/17 0730  BP: 133/75 121/71    Pulse: 79 78    Resp: 18 17    Temp: 97.8 F (36.6 C) 97.9 F (36.6 C)    TempSrc: Oral Oral    SpO2: 97% 97% 98% 98%  Weight:  53.1 kg (117 lb)    Height:        Intake/Output Summary (Last 24 hours) at 03/19/17 0958 Last data filed at 03/19/17 0530  Gross per 24 hour  Intake          2251.58 ml  Output             2800 ml  Net          -548.42 ml   Filed Weights   03/17/17 0608 03/18/17 0213 03/19/17 0529  Weight: 52.8 kg (116 lb 6.4 oz) 52.5 kg (115 lb 12.8 oz) 53.1 kg (117 lb)    Examination:  General exam: Blood lie flat, not in distress Respiratory system: Bibasal mild crackle, lung exam improving Cardiovascular system: Regular rate rhythm, S1-2 normal. No pedal edema.. Gastrointestinal system: Abdomen soft, nontender, nondistended. [place] positive Central nervous system: Alert awake and oriented. No focal neurological deficit. Extremities: Symmetric  5 x 5 power. Skin: No rashes, lesions or ulcers Psychiatry: Judgement and insight appear normal. Mood & affect appropriate.     Data Reviewed: I have personally reviewed following labs and imaging studies  CBC:  Recent Labs Lab 03/15/17 1508 03/17/17 0421 03/18/17 0355 03/19/17 0440  WBC 12.4* 9.8 14.1*  9.6  HGB 9.2* 7.8* 7.7* 10.4*  HCT 30.2* 26.2* 25.8* 33.3*  MCV 81.0 81.1 80.4 82.0  PLT 446* 373 380 794   Basic Metabolic Panel:  Recent Labs Lab 03/15/17 1508 03/16/17 0422 03/17/17 0421 03/18/17 0355 03/19/17 0440  NA 136 137 137 136 137  K 3.9 3.8 3.9 3.6 3.5  CL 104 102 100* 99* 97*  CO2 23 27 29 30  32  GLUCOSE 196* 209* 243* 167* 114*  BUN 9 11 24* 30* 28*  CREATININE 1.08* 1.07* 1.13* 1.18* 1.04*  CALCIUM 9.3 8.8* 9.1 8.9 9.0   GFR: Estimated Creatinine Clearance: 36 mL/min (A) (by C-G formula based on SCr of 1.04 mg/dL (H)). Liver Function Tests:  Recent Labs Lab 03/15/17 1730  AST 31  ALT 14  ALKPHOS 103  BILITOT 0.6  PROT 6.8  ALBUMIN 3.4*   No results for input(s): LIPASE, AMYLASE in the last 168 hours. No results for input(s): AMMONIA in the last 168 hours. Coagulation Profile:  Recent Labs Lab 03/18/17 0355  INR 0.97   Cardiac Enzymes:  Recent Labs Lab 03/15/17 2303 03/16/17 0422 03/16/17 1014  TROPONINI 0.06* 0.05* 0.03*   BNP (last 3 results) No results for input(s): PROBNP in the last 8760 hours. HbA1C: No results for input(s): HGBA1C in the last 72 hours. CBG:  Recent Labs Lab 03/18/17 0730 03/18/17 1209 03/18/17 1641 03/18/17 2105 03/19/17 0741  GLUCAP 129* 175* 160* 90 102*   Lipid Profile: No results for input(s): CHOL, HDL, LDLCALC, TRIG, CHOLHDL, LDLDIRECT in the last 72 hours. Thyroid Function Tests: No results for input(s): TSH, T4TOTAL, FREET4, T3FREE, THYROIDAB in the last 72 hours. Anemia Panel:  Recent Labs  03/17/17 1450  FERRITIN 38  TIBC 469*  IRON 13*   Sepsis Labs: No results for input(s): PROCALCITON, LATICACIDVEN in the last 168 hours.  No results found for this or any previous visit (from the past 240 hour(s)).       Radiology Studies: No results found.      Scheduled Meds: . atorvastatin  10 mg Oral QHS  . carvedilol  3.125 mg Oral BID WC  . clopidogrel  75 mg Oral Daily  .  docusate sodium  100 mg Oral Daily  . furosemide  40 mg Intravenous BID  . insulin aspart  0-15 Units Subcutaneous TID WC  . insulin aspart  0-5 Units Subcutaneous QHS  . ipratropium-albuterol  3 mL Nebulization TID  . methocarbamol  500 mg Oral TID  . mometasone-formoterol  2 puff Inhalation BID  . pantoprazole  40 mg Oral Daily  . pentoxifylline  400 mg Oral Daily  . polyethylene glycol  17 g Oral Daily  . predniSONE  40 mg Oral Q breakfast  . rOPINIRole  2 mg Oral QHS  . senna-docusate  1 tablet Oral BID  . sodium chloride flush  3 mL Intravenous Q12H  . sodium chloride flush  3 mL Intravenous Q12H  . cyanocobalamin  2,000 mcg Oral Daily   Continuous Infusions: . sodium chloride    . sodium chloride    . sodium chloride 10 mL/hr at 03/18/17 1709     LOS: 4 days  Koben Daman Tanna Furry, MD [facility] Pager [phone]  If 7PM-7AM, please contact night-coverage [email or web address] Password [facility] 03/19/2017, 9:58 AM

## 2017-03-20 DIAGNOSIS — I480 Paroxysmal atrial fibrillation: Secondary | ICD-10-CM | POA: Clinically undetermined

## 2017-03-20 LAB — BASIC METABOLIC PANEL
Anion gap: 8 (ref 5–15)
BUN: 21 mg/dL — AB (ref 6–20)
CHLORIDE: 98 mmol/L — AB (ref 101–111)
CO2: 33 mmol/L — AB (ref 22–32)
CREATININE: 0.95 mg/dL (ref 0.44–1.00)
Calcium: 8.8 mg/dL — ABNORMAL LOW (ref 8.9–10.3)
GFR calc Af Amer: >60 mL/min (ref >60)
GFR calc non Af Amer: 58 mL/min — ABNORMAL LOW (ref >60)
Glucose, Bld: 104 mg/dL — ABNORMAL HIGH (ref 65–99)
POTASSIUM: 3.6 mmol/L (ref 3.5–5.1)
SODIUM: 139 mmol/L (ref 135–145)

## 2017-03-20 LAB — GLUCOSE, CAPILLARY
GLUCOSE-CAPILLARY: 140 mg/dL — AB (ref 65–99)
GLUCOSE-CAPILLARY: 180 mg/dL — AB (ref 65–99)
Glucose-Capillary: 139 mg/dL — ABNORMAL HIGH (ref 65–99)
Glucose-Capillary: 116 mg/dL — ABNORMAL HIGH (ref 65–99)

## 2017-03-20 LAB — CBC
HEMATOCRIT: 33.9 % — AB (ref 36.0–46.0)
Hemoglobin: 10.8 g/dL — ABNORMAL LOW (ref 12.0–15.0)
MCH: 25.8 pg — AB (ref 26.0–34.0)
MCHC: 31.9 g/dL (ref 30.0–36.0)
MCV: 80.9 fL (ref 78.0–100.0)
PLATELETS: 359 10*3/uL (ref 150–400)
RBC: 4.19 MIL/uL (ref 3.87–5.11)
RDW: 16.2 % — AB (ref 11.5–15.5)
WBC: 8.2 10*3/uL (ref 4.0–10.5)

## 2017-03-20 LAB — MAGNESIUM: Magnesium: 2.1 mg/dL (ref 1.7–2.4)

## 2017-03-20 MED ORDER — ENOXAPARIN SODIUM 40 MG/0.4ML ~~LOC~~ SOLN
40.0000 mg | SUBCUTANEOUS | Status: DC
  Administered 2017-03-20: 40 mg via SUBCUTANEOUS
  Filled 2017-03-20: qty 0.4

## 2017-03-20 NOTE — Progress Notes (Signed)
PROGRESS NOTE    Kimberly Downs  IHK:[id number] DOB: 10-17-1945 DOA: 03/15/2017 PCP: [name], DO   Brief Narrative: 71 y.o. female with medical history significant for COPD, coronary artery disease with stenting, diabetes mellitus with diet control, hypertension, combined systolic/diastolic CHF, and drug-seeking behavior, now presenting to the emergency department from home for evaluation of shortness of breath and right lateral chest wall pain.  Assessment & Plan:   # Acute on chronic combined systolic and diastolic CHF (congestive heart failure) ([place]): -Patient presented with dyspnea on exertion, pedal edema and nonspecific chest pain. Chest x-ray with probable mild CHF and interstitial edema. BNP elevated to 2545. CT angiogram negative for PE --Net negative by 3.6 L and patient is able to lie flat. On Lasix 20 mg daily as per cardiologist. Low-salt diet, history of tension out. -Echocardiogram with reduced EF of 25% with akinesis of the distal left ventricle.  -Cardiology consult appreciated, waiting for cardiac catheterization likely tomorrow. -Continue Coreg  # Normocytic anemia: concerning for GI bleed. Patient recently had upper GI endoscopy with gastric ulcer with active bleeding.  -Received 2 units of red blood cell transfusion with stable hemoglobin. Patient denied any bleeding. Evaluated by GI recommended no endoscopy evaluation at this time. Continue Protonix and supportive care. Monitor CBC.  #Possible COPD acute exacerbation: Patient is active smoker. Education provided to the patient. -Continue DuoNeb nebulization -off prednisone -Clinically stable in room air.  #Acute respiratory failure with hypoxia improved. Currently in room air.  #Hypotension: Blood pressure improved. Currently on coreg, Lasix. Continue to monitor.  #Chest pain: CT chest to rule out PE. Continue to treat for CHF and follow the echo. Chest x-ray negative for acute bony abnormalities. Mild elevation  in troponin likely due to CHF.  #Pain management: Patient's son was concerned about patient's drug seeking behavior and requested to discontinue oxycodone. Patient looked very stable and I think she does not need oxycodone. Continue tramadol as needed for the pain management. Oxycodone discontinued. Continue current bowel regimen.  # leukocytosis and thrombocytosis: monitor CBC. CBC improved. Discontinue daily CBC.  DVT prophylaxis: No sign of bleeding therefore restarted prophylactic Lovenox Code Status:full Family Communication: Patient's son at bedside Disposition Plan:likely dc home in 1-2 days  Consultants:   Cardiology  [facility]  Procedures:echo Antimicrobials:none  Subjective: No new event. Denied headache, dizziness, chest pain, shortness of breath, nausea vomiting or abdominal pain. Objective: Vitals:   03/19/17 1929 03/19/17 2020 03/20/17 0541 03/20/17 0816  BP:  (!) 123/97 139/80   Pulse:  81 76   Resp:  18 18   Temp:  98.2 F (36.8 C) 98.6 F (37 C)   TempSrc:  Oral Oral   SpO2: 97% 98% 98% 98%  Weight:   52.2 kg (115 lb)   Height:        Intake/Output Summary (Last 24 hours) at 03/20/17 1158 Last data filed at 03/20/17 0923  Gross per 24 hour  Intake             1375 ml  Output             1301 ml  Net               74 ml   Filed Weights   03/18/17 0213 03/19/17 0529 03/20/17 0541  Weight: 52.5 kg (115 lb 12.8 oz) 53.1 kg (117 lb) 52.2 kg (115 lb)    Examination:  General exam: Able to lie flat, not in distress Respiratory system: Bibasal mild  crackles, lung exam improving Cardiovascular system: Regular rate rhythm S1-S2 normal. No pedal edema.. Gastrointestinal system: Abdomen soft nontender nondistended. Bowel sound positive Central nervous system: Alert awake and oriented. No focal neurological deficit. Extremities: Symmetric 5 x 5 power. Skin: No rashes, lesions or ulcers Psychiatry: Judgement and insight appear normal. Mood & affect  appropriate.     Data Reviewed: I have personally reviewed following labs and imaging studies  CBC:  Recent Labs Lab 03/15/17 1508 03/17/17 0421 03/18/17 0355 03/19/17 0440 03/20/17 0417  WBC 12.4* 9.8 14.1* 9.6 8.2  HGB 9.2* 7.8* 7.7* 10.4* 10.8*  HCT 30.2* 26.2* 25.8* 33.3* 33.9*  MCV 81.0 81.1 80.4 82.0 80.9  PLT 446* 373 380 326 409   Basic Metabolic Panel:  Recent Labs Lab 03/16/17 0422 03/17/17 0421 03/18/17 0355 03/19/17 0440 03/20/17 0417  NA 137 137 136 137 139  K 3.8 3.9 3.6 3.5 3.6  CL 102 100* 99* 97* 98*  CO2 27 29 30  32 33*  GLUCOSE 209* 243* 167* 114* 104*  BUN 11 24* 30* 28* 21*  CREATININE 1.07* 1.13* 1.18* 1.04* 0.95  CALCIUM 8.8* 9.1 8.9 9.0 8.8*  MG  --   --   --   --  2.1   GFR: Estimated Creatinine Clearance: 39.5 mL/min (by C-G formula based on SCr of 0.95 mg/dL). Liver Function Tests:  Recent Labs Lab 03/15/17 1730  AST 31  ALT 14  ALKPHOS 103  BILITOT 0.6  PROT 6.8  ALBUMIN 3.4*   No results for input(s): LIPASE, AMYLASE in the last 168 hours. No results for input(s): AMMONIA in the last 168 hours. Coagulation Profile:  Recent Labs Lab 03/18/17 0355  INR 0.97   Cardiac Enzymes:  Recent Labs Lab 03/15/17 2303 03/16/17 0422 03/16/17 1014  TROPONINI 0.06* 0.05* 0.03*   BNP (last 3 results) No results for input(s): PROBNP in the last 8760 hours. HbA1C: No results for input(s): HGBA1C in the last 72 hours. CBG:  Recent Labs Lab 03/19/17 1142 03/19/17 1606 03/19/17 2050 03/20/17 0735 03/20/17 1125  GLUCAP 261* 218* 70 139* 140*   Lipid Profile: No results for input(s): CHOL, HDL, LDLCALC, TRIG, CHOLHDL, LDLDIRECT in the last 72 hours. Thyroid Function Tests: No results for input(s): TSH, T4TOTAL, FREET4, T3FREE, THYROIDAB in the last 72 hours. Anemia Panel:  Recent Labs  03/17/17 1450  FERRITIN 38  TIBC 469*  IRON 13*   Sepsis Labs: No results for input(s): PROCALCITON, LATICACIDVEN in the last 168  hours.  No results found for this or any previous visit (from the past 240 hour(s)).       Radiology Studies: No results found.      Scheduled Meds: . atorvastatin  10 mg Oral QHS  . carvedilol  3.125 mg Oral BID WC  . clopidogrel  75 mg Oral Daily  . docusate sodium  100 mg Oral Daily  . furosemide  20 mg Oral Daily  . insulin aspart  0-15 Units Subcutaneous TID WC  . insulin aspart  0-5 Units Subcutaneous QHS  . ipratropium-albuterol  3 mL Nebulization TID  . methocarbamol  500 mg Oral TID  . mometasone-formoterol  2 puff Inhalation BID  . pantoprazole  40 mg Oral Daily  . pentoxifylline  400 mg Oral Daily  . polyethylene glycol  17 g Oral Daily  . rOPINIRole  2 mg Oral QHS  . senna-docusate  1 tablet Oral BID  . sodium chloride flush  3 mL Intravenous Q12H  . sodium chloride  flush  3 mL Intravenous Q12H  . cyanocobalamin  2,000 mcg Oral Daily   Continuous Infusions: . sodium chloride    . sodium chloride    . sodium chloride 10 mL/hr at 03/18/17 1709     LOS: 5 days    Kaleeya Hancock Tanna Furry, MD [facility] Pager [phone]  If 7PM-7AM, please contact night-coverage [email or web address] Password [facility] 03/20/2017, 11:58 AM

## 2017-03-20 NOTE — Progress Notes (Signed)
Progress Note  Patient Name: Kimberly Downs Date of Encounter: 03/20/2017  Primary Cardiologist: Dr. Gwenlyn Found  Subjective   Feeling much improved today. No further chest pain or SOB.  Inpatient Medications    Scheduled Meds: . atorvastatin  10 mg Oral QHS  . carvedilol  3.125 mg Oral BID WC  . clopidogrel  75 mg Oral Daily  . docusate sodium  100 mg Oral Daily  . furosemide  20 mg Oral Daily  . insulin aspart  0-15 Units Subcutaneous TID WC  . insulin aspart  0-5 Units Subcutaneous QHS  . ipratropium-albuterol  3 mL Nebulization TID  . methocarbamol  500 mg Oral TID  . mometasone-formoterol  2 puff Inhalation BID  . pantoprazole  40 mg Oral Daily  . pentoxifylline  400 mg Oral Daily  . polyethylene glycol  17 g Oral Daily  . rOPINIRole  2 mg Oral QHS  . senna-docusate  1 tablet Oral BID  . sodium chloride flush  3 mL Intravenous Q12H  . sodium chloride flush  3 mL Intravenous Q12H  . cyanocobalamin  2,000 mcg Oral Daily   Continuous Infusions: . sodium chloride    . sodium chloride    . sodium chloride 10 mL/hr at 03/18/17 1709   PRN Meds: sodium chloride, sodium chloride, acetaminophen, ondansetron (ZOFRAN) IV, oxyCODONE, sodium chloride flush, sodium chloride flush, sodium chloride flush, traMADol   Vital Signs    Vitals:   03/19/17 1438 03/19/17 1929 03/19/17 2020 03/20/17 0541  BP:   (!) 123/97 139/80  Pulse:   81 76  Resp:   18 18  Temp:   98.2 F (36.8 C) 98.6 F (37 C)  TempSrc:   Oral Oral  SpO2: 98% 97% 98% 98%  Weight:    115 lb (52.2 kg)  Height:        Intake/Output Summary (Last 24 hours) at 03/20/17 0807 Last data filed at 03/20/17 0600  Gross per 24 hour  Intake              960 ml  Output             1301 ml  Net             -341 ml   Filed Weights   03/18/17 0213 03/19/17 0529 03/20/17 0541  Weight: 115 lb 12.8 oz (52.5 kg) 117 lb (53.1 kg) 115 lb (52.2 kg)    Telemetry    SR - Personally Reviewed  ECG    None  today  Physical Exam   GEN: Well nourished, well developed, in no acute distress  HEENT: normal  Neck: no JVD, carotid bruits, or masses Cardiac: RRR; no murmurs, rubs, or gallops,no edema  Respiratory:  clear to auscultation bilaterally, normal work of breathing GI: soft, nontender, nondistended, + BS MS: no deformity or atrophy  Skin: warm and dry Neuro:  Strength and sensation are intact Psych: euthymic mood, full affect  Labs    Chemistry Recent Labs Lab 03/15/17 1730  03/18/17 0355 03/19/17 0440 03/20/17 0417  NA  --   < > 136 137 139  K  --   < > 3.6 3.5 3.6  CL  --   < > 99* 97* 98*  CO2  --   < > 30 32 33*  GLUCOSE  --   < > 167* 114* 104*  BUN  --   < > 30* 28* 21*  CREATININE  --   < > 1.18* 1.04* 0.95  CALCIUM  --   < > 8.9 9.0 8.8*  PROT 6.8  --   --   --   --   ALBUMIN 3.4*  --   --   --   --   AST 31  --   --   --   --   ALT 14  --   --   --   --   ALKPHOS 103  --   --   --   --   BILITOT 0.6  --   --   --   --   GFRNONAA  --   < > 45* 52* 58*  GFRAA  --   < > 52* >60 >60  ANIONGAP  --   < > 7 8 8   < > = values in this interval not displayed.   Hematology  Recent Labs Lab 03/18/17 0355 03/19/17 0440 03/20/17 0417  WBC 14.1* 9.6 8.2  RBC 3.21* 4.06 4.19  HGB 7.7* 10.4* 10.8*  HCT 25.8* 33.3* 33.9*  MCV 80.4 82.0 80.9  MCH 24.0* 25.6* 25.8*  MCHC 29.8* 31.2 31.9  RDW 16.8* 16.4* 16.2*  PLT 380 326 359    Cardiac Enzymes  Recent Labs Lab 03/15/17 2303 03/16/17 0422 03/16/17 1014  TROPONINI 0.06* 0.05* 0.03*     Recent Labs Lab 03/15/17 1517  TROPIPOC 0.05     BNP  Recent Labs Lab 03/15/17 1717  BNP 2,545.7*     DDimer   Recent Labs Lab 03/15/17 1717  DDIMER 1.61*     Radiology    No results found.  Cardiac Studies   Echo 03/16/17 Study Conclusions  - Left ventricle: LVEF is approximately 25% with akinesis of the   distal 1/3 to1/2 of the ventricle. The cavity size was mildly   dilated. Wall thickness  was normal. Features are consistent with   a pseudonormal left ventricular filling pattern, with concomitant   abnormal relaxation and increased filling pressure (grade 2   diastolic dysfunction). - Left atrium: The atrium was mildly dilated.  Patient Profile     71 y.o. female obesity, smoker (50-75 pack years), hyperlipidemia, COPD, CAD with hx of stenting, diabetes managed with diet, hypertension, combined sys/diastolic heart failure, claudication, PAF, anemia and drug-seeking behavior  presented to the ER on 03/15/17 with complaints of SOB for the past few days with occasional cough.  She was admitted 01/26/17 with COPD exacerbation, CAP, afib with RVR, acute respiratory failure w/hypoxia and acute anemia with possible GI bleed (+ FOBT). EGD showed nonbleeding gastric ulcers with clean ulcer base, non-bleeding erosive duodenum. Hold ASA and plavix.    Assessment & Plan    1. Acute on chronic combined CHF SOB has improved. Net out 3.6L Plan for LHC/RHC on Monday to further define anatomy and pressures. Anemia has stabilized after transfusion.  2. Anemia Clean ulcer found on EGD. No further drop in Hb after 2 units PRBC.  3. PAF CHADS2VaSC 5. If further AF Mekhi Sonn need anticoagulation. Had AF during respiratory illness.  4. HTN BP stable  5. Elevated D-dimer No PE found  6. DM Per primary    7. HLD - 04/04/2016: Cholesterol 131; HDL 57; LDL Cholesterol 66; Triglycerides 39; VLDL 8  Continue statin  Signed, Jaymian Bogart Meredith Leeds, MD  03/20/2017, 8:07 AM

## 2017-03-21 ENCOUNTER — Encounter (HOSPITAL_COMMUNITY): Payer: Self-pay | Admitting: Cardiovascular Disease

## 2017-03-21 ENCOUNTER — Encounter (HOSPITAL_COMMUNITY)
Admission: EM | Disposition: A | Discharge status: Home Health | Payer: Self-pay | Source: Home / Self Care | Attending: Nephrology

## 2017-03-21 ENCOUNTER — Telehealth: Payer: Self-pay | Admitting: Cardiovascular Disease

## 2017-03-21 DIAGNOSIS — D62 Acute posthemorrhagic anemia: Secondary | ICD-10-CM

## 2017-03-21 DIAGNOSIS — I2510 Atherosclerotic heart disease of native coronary artery without angina pectoris: Secondary | ICD-10-CM

## 2017-03-21 DIAGNOSIS — I10 Essential (primary) hypertension: Secondary | ICD-10-CM

## 2017-03-21 DIAGNOSIS — I25119 Atherosclerotic heart disease of native coronary artery with unspecified angina pectoris: Secondary | ICD-10-CM

## 2017-03-21 HISTORY — PX: LEFT HEART CATH AND CORONARY ANGIOGRAPHY: CATH118249

## 2017-03-21 LAB — CBC
HEMATOCRIT: 38.6 % (ref 36.0–46.0)
HEMOGLOBIN: 12.2 g/dL (ref 12.0–15.0)
MCH: 25.7 pg — AB (ref 26.0–34.0)
MCHC: 31.6 g/dL (ref 30.0–36.0)
MCV: 81.3 fL (ref 78.0–100.0)
Platelets: 374 10*3/uL (ref 150–400)
RBC: 4.75 MIL/uL (ref 3.87–5.11)
RDW: 16.2 % — ABNORMAL HIGH (ref 11.5–15.5)
WBC: 9 10*3/uL (ref 4.0–10.5)

## 2017-03-21 LAB — BASIC METABOLIC PANEL
Anion gap: 8 (ref 5–15)
BUN: 13 mg/dL (ref 6–20)
CHLORIDE: 97 mmol/L — AB (ref 101–111)
CO2: 31 mmol/L (ref 22–32)
CREATININE: 0.98 mg/dL (ref 0.44–1.00)
Calcium: 8.9 mg/dL (ref 8.9–10.3)
GFR calc non Af Amer: 56 mL/min — ABNORMAL LOW (ref >60)
Glucose, Bld: 130 mg/dL — ABNORMAL HIGH (ref 65–99)
POTASSIUM: 3.8 mmol/L (ref 3.5–5.1)
SODIUM: 136 mmol/L (ref 135–145)

## 2017-03-21 LAB — GLUCOSE, CAPILLARY
GLUCOSE-CAPILLARY: 142 mg/dL — AB (ref 65–99)
GLUCOSE-CAPILLARY: 111 mg/dL — AB (ref 65–99)
GLUCOSE-CAPILLARY: 143 mg/dL — AB (ref 65–99)
GLUCOSE-CAPILLARY: 85 mg/dL (ref 65–99)

## 2017-03-21 SURGERY — LEFT HEART CATH AND CORONARY ANGIOGRAPHY
Anesthesia: LOCAL

## 2017-03-21 MED ORDER — ONDANSETRON HCL 4 MG/2ML IJ SOLN
4.0000 mg | Freq: Four times a day (QID) | INTRAMUSCULAR | Status: DC | PRN
  Administered 2017-03-22: 4 mg via INTRAVENOUS
  Filled 2017-03-21: qty 2

## 2017-03-21 MED ORDER — IOPAMIDOL (ISOVUE-370) INJECTION 76%
INTRAVENOUS | Status: DC | PRN
  Administered 2017-03-21: 60 mL via INTRA_ARTERIAL

## 2017-03-21 MED ORDER — LIDOCAINE HCL 1 % IJ SOLN
INTRAMUSCULAR | Status: AC
  Filled 2017-03-21: qty 20

## 2017-03-21 MED ORDER — SODIUM CHLORIDE 0.9 % IV SOLN
INTRAVENOUS | Status: AC
  Administered 2017-03-21: 11:00:00 via INTRAVENOUS

## 2017-03-21 MED ORDER — SODIUM CHLORIDE 0.9 % IV SOLN: 250.0000 mL | INTRAVENOUS | Status: DC | PRN

## 2017-03-21 MED ORDER — SODIUM CHLORIDE 0.9 % IV SOLN
250.0000 mL | INTRAVENOUS | Status: DC | PRN
  Administered 2017-03-21: 250 mL via INTRAVENOUS

## 2017-03-21 MED ORDER — SODIUM CHLORIDE 0.9 % IV SOLN
INTRAVENOUS | Status: DC
  Administered 2017-03-21: 09:00:00 via INTRAVENOUS

## 2017-03-21 MED ORDER — IOPAMIDOL (ISOVUE-370) INJECTION 76%
INTRAVENOUS | Status: AC
  Filled 2017-03-21: qty 100

## 2017-03-21 MED ORDER — HEPARIN (PORCINE) IN NACL 2-0.9 UNIT/ML-% IJ SOLN
INTRAMUSCULAR | Status: AC
  Filled 2017-03-21: qty 1000

## 2017-03-21 MED ORDER — SODIUM CHLORIDE 0.9% FLUSH: 3.0000 mL | INTRAVENOUS | Status: DC | PRN

## 2017-03-21 MED ORDER — FENTANYL CITRATE (PF) 100 MCG/2ML IJ SOLN
INTRAMUSCULAR | Status: DC | PRN
  Administered 2017-03-21 (×2): 25 ug via INTRAVENOUS

## 2017-03-21 MED ORDER — HYDRALAZINE HCL 20 MG/ML IJ SOLN: 10.0000 mg | INTRAMUSCULAR | Status: DC | PRN

## 2017-03-21 MED ORDER — SODIUM CHLORIDE 0.9% FLUSH
3.0000 mL | Freq: Two times a day (BID) | INTRAVENOUS | Status: DC
  Administered 2017-03-21: 3 mL via INTRAVENOUS

## 2017-03-21 MED ORDER — SODIUM CHLORIDE 0.9% FLUSH
3.0000 mL | Freq: Two times a day (BID) | INTRAVENOUS | Status: DC
  Administered 2017-03-21 – 2017-03-22 (×3): 3 mL via INTRAVENOUS

## 2017-03-21 MED ORDER — ASPIRIN 81 MG PO CHEW
81.0000 mg | CHEWABLE_TABLET | ORAL | Status: AC
  Administered 2017-03-21: 81 mg via ORAL
  Filled 2017-03-21: qty 1

## 2017-03-21 MED ORDER — MIDAZOLAM HCL 2 MG/2ML IJ SOLN
INTRAMUSCULAR | Status: AC
  Filled 2017-03-21: qty 2

## 2017-03-21 MED ORDER — MIDAZOLAM HCL 2 MG/2ML IJ SOLN
INTRAMUSCULAR | Status: DC | PRN
  Administered 2017-03-21 (×2): 1 mg via INTRAVENOUS

## 2017-03-21 MED ORDER — HEPARIN (PORCINE) IN NACL 2-0.9 UNIT/ML-% IJ SOLN
INTRAMUSCULAR | Status: AC | PRN
Start: 2017-03-21 — End: 2017-03-21
  Administered 2017-03-21: 1000 mL

## 2017-03-21 MED ORDER — LIDOCAINE HCL (PF) 1 % IJ SOLN
INTRAMUSCULAR | Status: DC | PRN
Start: 2017-03-21 — End: 2017-03-21
  Administered 2017-03-21: 15 mL

## 2017-03-21 MED ORDER — ACETAMINOPHEN 325 MG PO TABS: 650.0000 mg | ORAL_TABLET | ORAL | Status: DC | PRN

## 2017-03-21 MED ORDER — FENTANYL CITRATE (PF) 100 MCG/2ML IJ SOLN
INTRAMUSCULAR | Status: AC
  Filled 2017-03-21: qty 2

## 2017-03-21 SURGICAL SUPPLY — 11 items
CATH INFINITI 5FR MULTPACK ANG (CATHETERS) ×2 IMPLANT
DEVICE CLOSURE MYNXGRIP 5F (Vascular Products) ×2 IMPLANT
KIT HEART LEFT (KITS) ×2 IMPLANT
PACK CARDIAC CATHETERIZATION (CUSTOM PROCEDURE TRAY) ×2 IMPLANT
SHEATH PINNACLE 5F 10CM (SHEATH) ×2 IMPLANT
SYR MEDRAD MARK V 150ML (SYRINGE) ×2 IMPLANT
TRANSDUCER W/STOPCOCK (MISCELLANEOUS) ×2 IMPLANT
TUBING ART PRESS 72  MALE/FEM (TUBING)
TUBING ART PRESS 72 MALE/FEM (TUBING) IMPLANT
TUBING CIL FLEX 10 FLL-RA (TUBING) ×2 IMPLANT
WIRE EMERALD 3MM-J .035X150CM (WIRE) ×2 IMPLANT

## 2017-03-21 NOTE — Progress Notes (Signed)
PROGRESS NOTE    Kimberly Downs  WNI:[id number] DOB: [birth year] DOA: 03/15/2017 PCP: [name], DO   Brief Narrative: 71 y.o. female with medical history significant for COPD, coronary artery disease with stenting, diabetes mellitus with diet control, hypertension, combined systolic/diastolic CHF, and drug-seeking behavior, now presenting to the emergency department from home for evaluation of shortness of breath and right lateral chest wall pain.  Assessment & Plan:   # Acute on chronic combined systolic and diastolic CHF (congestive heart failure) ([place]): -Patient presented with dyspnea on exertion, pedal edema and nonspecific chest pain. Chest x-ray with probable mild CHF and interstitial edema. BNP elevated to 2545. CT angiogram negative for PE --Net negative by 3.7 L and patient is able to lie flat. On Lasix 20 mg daily as per cardiologist. Low-salt diet -Echocardiogram with reduced EF of 25% with akinesis of the distal left ventricle.  -Cardiology consult appreciated, plan for cardiac cath today by his cardiologist. -Continue Coreg  # Normocytic anemia: concerning for GI bleed. Patient recently had upper GI endoscopy with gastric ulcer with active bleeding.  -Received 2 units of red blood cell transfusion with stable hemoglobin. Patient denied any bleeding. Evaluated by GI recommended no endoscopy evaluation at this time. Continue Protonix and supportive care. Monitor CBC. Hemoglobin 12.2 today.  #Possible COPD acute exacerbation: Patient is active smoker. Education provided to the patient. -Continue DuoNeb nebulization -off prednisone -Clinically stable in room air.  #Acute respiratory failure with hypoxia improved. Currently in room air.  #Hypotension: Blood pressure improved. Currently on coreg, Lasix. Continue to monitor.  #Chest pain: CT chest to rule out PE. Continue to treat for CHF and follow the echo. Chest x-ray negative for acute bony abnormalities. Mild elevation in  troponin likely due to CHF.  #Pain management: Continue Ultram and bowel regimen. Try to wean pain medicine gradually.  # leukocytosis and thrombocytosis: Improved.  DVT prophylaxis: Cardiac cath today. On Plavix Code Status:full Family Communication: No family at bedside Disposition Plan:likely dc home in 1-2 days  Consultants:   Cardiology  [facility]  Procedures:echo Antimicrobials:none  Subjective: No new event. Feeling good. Denied headache, dizziness, nausea vomiting chest pain or shortness of breath. Objective: Vitals:   03/21/17 1014 03/21/17 1019 03/21/17 1024 03/21/17 1028  BP: 116/69 112/63 98/68   Pulse: 67 67 88 (!) 0  Resp: 10 (!) 5 16 (!) 0  Temp:      TempSrc:      SpO2: 99% 98% 98% (!) 0%  Weight:      Height:        Intake/Output Summary (Last 24 hours) at 03/21/17 1052 Last data filed at 03/21/17 0844  Gross per 24 hour  Intake             1158 ml  Output             1725 ml  Net             -567 ml   Filed Weights   03/19/17 0529 03/20/17 0541 03/21/17 0636  Weight: 53.1 kg (117 lb) 52.2 kg (115 lb) 52.2 kg (115 lb 3 oz)    Examination:  General exam: Not in distress Respiratory system: Clear bilaterally, respiratory effort normal Cardiovascular system: Regular rate and rhythm, S1-S2 normal. No pedal edema Gastrointestinal system: Abdomen soft, nontender, nondistended. Bowel sound positive. Central nervous system: Alert awake and oriented. No focal neurological deficit. Extremities: Symmetric 5 x 5 power. Skin: No rashes, lesions or ulcers Psychiatry: Judgement and  insight appear normal. Mood & affect appropriate.     Data Reviewed: I have personally reviewed following labs and imaging studies  CBC:  Recent Labs Lab 03/17/17 0421 03/18/17 0355 03/19/17 0440 03/20/17 0417 03/21/17 0823  WBC 9.8 14.1* 9.6 8.2 9.0  HGB 7.8* 7.7* 10.4* 10.8* 12.2  HCT 26.2* 25.8* 33.3* 33.9* 38.6  MCV 81.1 80.4 82.0 80.9 81.3  PLT 373 380 326  359 672   Basic Metabolic Panel:  Recent Labs Lab 03/17/17 0421 03/18/17 0355 03/19/17 0440 03/20/17 0417 03/21/17 0823  NA 137 136 137 139 136  K 3.9 3.6 3.5 3.6 3.8  CL 100* 99* 97* 98* 97*  CO2 29 30 32 33* 31  GLUCOSE 243* 167* 114* 104* 130*  BUN 24* 30* 28* 21* 13  CREATININE 1.13* 1.18* 1.04* 0.95 0.98  CALCIUM 9.1 8.9 9.0 8.8* 8.9  MG  --   --   --  2.1  --    GFR: Estimated Creatinine Clearance: 38.3 mL/min (by C-G formula based on SCr of 0.98 mg/dL). Liver Function Tests:  Recent Labs Lab 03/15/17 1730  AST 31  ALT 14  ALKPHOS 103  BILITOT 0.6  PROT 6.8  ALBUMIN 3.4*   No results for input(s): LIPASE, AMYLASE in the last 168 hours. No results for input(s): AMMONIA in the last 168 hours. Coagulation Profile:  Recent Labs Lab 03/18/17 0355  INR 0.97   Cardiac Enzymes:  Recent Labs Lab 03/15/17 2303 03/16/17 0422 03/16/17 1014  TROPONINI 0.06* 0.05* 0.03*   BNP (last 3 results) No results for input(s): PROBNP in the last 8760 hours. HbA1C: No results for input(s): HGBA1C in the last 72 hours. CBG:  Recent Labs Lab 03/20/17 0735 03/20/17 1125 03/20/17 1626 03/20/17 2124 03/21/17 0756  GLUCAP 139* 140* 116* 180* 142*   Lipid Profile: No results for input(s): CHOL, HDL, LDLCALC, TRIG, CHOLHDL, LDLDIRECT in the last 72 hours. Thyroid Function Tests: No results for input(s): TSH, T4TOTAL, FREET4, T3FREE, THYROIDAB in the last 72 hours. Anemia Panel: No results for input(s): VITAMINB12, FOLATE, FERRITIN, TIBC, IRON, RETICCTPCT in the last 72 hours. Sepsis Labs: No results for input(s): PROCALCITON, LATICACIDVEN in the last 168 hours.  No results found for this or any previous visit (from the past 240 hour(s)).       Radiology Studies: No results found.      Scheduled Meds: . atorvastatin  10 mg Oral QHS  . carvedilol  3.125 mg Oral BID WC  . clopidogrel  75 mg Oral Daily  . docusate sodium  100 mg Oral Daily  . furosemide   20 mg Oral Daily  . insulin aspart  0-15 Units Subcutaneous TID WC  . insulin aspart  0-5 Units Subcutaneous QHS  . ipratropium-albuterol  3 mL Nebulization TID  . methocarbamol  500 mg Oral TID  . mometasone-formoterol  2 puff Inhalation BID  . pantoprazole  40 mg Oral Daily  . pentoxifylline  400 mg Oral Daily  . polyethylene glycol  17 g Oral Daily  . rOPINIRole  2 mg Oral QHS  . senna-docusate  1 tablet Oral BID  . sodium chloride flush  3 mL Intravenous Q12H  . sodium chloride flush  3 mL Intravenous Q12H  . cyanocobalamin  2,000 mcg Oral Daily   Continuous Infusions: . sodium chloride    . sodium chloride 10 mL/hr at 03/18/17 1709  . sodium chloride 75 mL/hr at 03/21/17 1048  . sodium chloride  LOS: 6 days    Tallin Hart Tanna Furry, MD [facility] Pager [phone]  If 7PM-7AM, please contact night-coverage [email or web address] Password [facility] 03/21/2017, 10:52 AM

## 2017-03-21 NOTE — Progress Notes (Signed)
Physical Therapy Treatment Patient Details Name: Kimberly Downs MRN: [id number] DOB: 10-24-1945 Today's Date: 03/21/2017    History of Present Illness 71 y.o. female with onset of CHF and cardiomegaly, flat elevated troponin, atelectasis and increased LE edema was admitted.  Has tachycardia, drops in BP, pulm effusion.  PMHx:  ASCVD, stent, AA angiogram, DM, HTN, HLD, COPD, bronchitis, a-fib, respiratory failure with intubation, emphysema,    PT Comments    Patient is making progress toward mobility goals. BP WNL this session. Pt with c/o slight dizziness in standing and reported this is normal for her. Pt tolerated increased gait distance and stair negotiation. Encouraged pt to use RW upon d/c home.    Follow Up Recommendations  Home health PT;Supervision for mobility/OOB     Equipment Recommendations  Rolling walker with 5" wheels (if pt does not have a walker in good repair)    Recommendations for Other Services       Precautions / Restrictions Precautions Precautions: Fall (telemetry) Restrictions Weight Bearing Restrictions: No    Mobility  Bed Mobility Overal bed mobility: Modified Independent Bed Mobility: Supine to Sit;Sit to Supine           General bed mobility comments: increased time/effort  Transfers Overall transfer level: Needs assistance Equipment used: Rolling walker (2 wheeled);1 person hand held assist Transfers: Sit to/from Stand Sit to Stand: Supervision         General transfer comment: supervision for safety  Ambulation/Gait Ambulation/Gait assistance: Min guard;Supervision Ambulation Distance (Feet): 200 Feet Assistive device: Rolling walker (2 wheeled) Gait Pattern/deviations: Step-through pattern;Decreased stride length;Trunk flexed     General Gait Details: BP WNL; c/o a little dizziness upon standing which pt reported was "normal"; cues for proximity of RW   Stairs Stairs: Yes   Stair Management: One rail Left;Step to  pattern;Forwards Number of Stairs: 4 General stair comments: cues for step to pattern and safety; no physical assist needed  Wheelchair Mobility    Modified Rankin (Stroke Patients Only)       Balance Overall balance assessment: Needs assistance Sitting-balance support: Feet supported Sitting balance-Leahy Scale: Good       Standing balance-Leahy Scale: Fair Standing balance comment: pt able to static stand without UE support                            Cognition Arousal/Alertness: Awake/alert Behavior During Therapy: WFL for tasks assessed/performed Overall Cognitive Status: Within Functional Limits for tasks assessed                                        Exercises      General Comments General comments (skin integrity, edema, etc.): pt reported decreased sensation in R thigh      Pertinent Vitals/Pain Pain Assessment: Faces Faces Pain Scale: Hurts a little bit Pain Location: chest Pain Descriptors / Indicators: Discomfort Pain Intervention(s): Monitored during session    Home Living                      Prior Function            PT Goals (current goals can now be found in the care plan section) Acute Rehab PT Goals PT Goal Formulation: With patient Time For Goal Achievement: 03/30/17 Potential to Achieve Goals: Good Progress towards PT goals: Progressing toward goals  Frequency    Min 3X/week      PT Plan Current plan remains appropriate    Co-evaluation              AM-PAC PT "6 Clicks" Daily Activity  Outcome Measure  Difficulty turning over in bed (including adjusting bedclothes, sheets and blankets)?: A Little Difficulty moving from lying on back to sitting on the side of the bed? : A Little Difficulty sitting down on and standing up from a chair with arms (e.g., wheelchair, bedside commode, etc,.)?: A Little Help needed moving to and from a bed to chair (including a wheelchair)?: A  Little Help needed walking in hospital room?: A Little Help needed climbing 3-5 steps with a railing? : A Little 6 Click Score: 18    End of Session Equipment Utilized During Treatment: Gait belt Activity Tolerance: Patient tolerated treatment well Patient left: in bed;with call bell/phone within reach Nurse Communication: Mobility status PT Visit Diagnosis: Unsteadiness on feet (R26.81);Muscle weakness (generalized) (M62.81)     Time: 5697-9480 PT Time Calculation (min) (ACUTE ONLY): 31 min  Charges:  $Gait Training: 8-22 mins $Therapeutic Activity: 8-22 mins                    G Codes:       Kimberly Downs, PTA Pager: [phone]     Kimberly Downs 03/21/2017, 2:40 PM

## 2017-03-21 NOTE — Telephone Encounter (Signed)
Pt is in the hospital. She needs a letter on letter head for her sister for the [place].It needs to say that the pt is in the hospital with her diagnosis and she is being treated by Dr [name] and his his group.SIster is cancelling her flight,because sister is in the hospital.Her sister name is,Sandra Hilt and [employer] is the company.Please mail to [employer].Any questions please call Yezenia,the patient.

## 2017-03-21 NOTE — Interval H&P Note (Signed)
Cath Lab Visit (complete for each Cath Lab visit)  Clinical Evaluation Leading to the Procedure:   ACS: No.  Non-ACS:    Anginal Classification: CCS II  Anti-ischemic medical therapy: Minimal Therapy (1 class of medications)  Non-Invasive Test Results: No non-invasive testing performed  Prior CABG: No previous CABG      History and Physical Interval Note:  03/21/2017 9:42 AM  Kimberly Downs  has presented today for surgery, with the diagnosis of cp, sob  The various methods of treatment have been discussed with the patient and family. After consideration of risks, benefits and other options for treatment, the patient has consented to  Procedure(s): Left Heart Cath and Coronary Angiography (N/A) as a surgical intervention .  The patient's history has been reviewed, patient examined, no change in status, stable for surgery.  I have reviewed the patient's chart and labs.  Questions were answered to the patient's satisfaction.     [name]

## 2017-03-21 NOTE — Progress Notes (Signed)
Called and spoke to son, Gael Delude ([phone]) to update him on pt's Plan of Care.  Post cath, pt was under the impression that she would be discharged.  No physician note r/t discharge has been written, therefore, I informed her son and pt that expected discharge will "probably" occur tomorrow since there were no complications with cath.

## 2017-03-21 NOTE — Discharge Instructions (Signed)
Call [facility] [address] at [phone] if any bleeding, swelling or drainage at cath site.  May shower, no tub baths for 48 hours for groin sticks. No lifting over 5 pounds for 3 days.  No Driving for 3 days ° °

## 2017-03-21 NOTE — H&P (View-Only) (Signed)
Progress Note  Patient Name: Kimberly Downs Date of Encounter: 03/20/2017  Primary Cardiologist: Dr. Gwenlyn Found  Subjective   Feeling much improved today. No further chest pain or SOB.  Inpatient Medications    Scheduled Meds: . atorvastatin  10 mg Oral QHS  . carvedilol  3.125 mg Oral BID WC  . clopidogrel  75 mg Oral Daily  . docusate sodium  100 mg Oral Daily  . furosemide  20 mg Oral Daily  . insulin aspart  0-15 Units Subcutaneous TID WC  . insulin aspart  0-5 Units Subcutaneous QHS  . ipratropium-albuterol  3 mL Nebulization TID  . methocarbamol  500 mg Oral TID  . mometasone-formoterol  2 puff Inhalation BID  . pantoprazole  40 mg Oral Daily  . pentoxifylline  400 mg Oral Daily  . polyethylene glycol  17 g Oral Daily  . rOPINIRole  2 mg Oral QHS  . senna-docusate  1 tablet Oral BID  . sodium chloride flush  3 mL Intravenous Q12H  . sodium chloride flush  3 mL Intravenous Q12H  . cyanocobalamin  2,000 mcg Oral Daily   Continuous Infusions: . sodium chloride    . sodium chloride    . sodium chloride 10 mL/hr at 03/18/17 1709   PRN Meds: sodium chloride, sodium chloride, acetaminophen, ondansetron (ZOFRAN) IV, oxyCODONE, sodium chloride flush, sodium chloride flush, sodium chloride flush, traMADol   Vital Signs    Vitals:   03/19/17 1438 03/19/17 1929 03/19/17 2020 03/20/17 0541  BP:   (!) 123/97 139/80  Pulse:   81 76  Resp:   18 18  Temp:   98.2 F (36.8 C) 98.6 F (37 C)  TempSrc:   Oral Oral  SpO2: 98% 97% 98% 98%  Weight:    115 lb (52.2 kg)  Height:        Intake/Output Summary (Last 24 hours) at 03/20/17 0807 Last data filed at 03/20/17 0600  Gross per 24 hour  Intake              960 ml  Output             1301 ml  Net             -341 ml   Filed Weights   03/18/17 0213 03/19/17 0529 03/20/17 0541  Weight: 115 lb 12.8 oz (52.5 kg) 117 lb (53.1 kg) 115 lb (52.2 kg)    Telemetry    SR - Personally Reviewed  ECG    None  today  Physical Exam   GEN: Well nourished, well developed, in no acute distress  HEENT: normal  Neck: no JVD, carotid bruits, or masses Cardiac: RRR; no murmurs, rubs, or gallops,no edema  Respiratory:  clear to auscultation bilaterally, normal work of breathing GI: soft, nontender, nondistended, + BS MS: no deformity or atrophy  Skin: warm and dry Neuro:  Strength and sensation are intact Psych: euthymic mood, full affect  Labs    Chemistry Recent Labs Lab 03/15/17 1730  03/18/17 0355 03/19/17 0440 03/20/17 0417  NA  --   < > 136 137 139  K  --   < > 3.6 3.5 3.6  CL  --   < > 99* 97* 98*  CO2  --   < > 30 32 33*  GLUCOSE  --   < > 167* 114* 104*  BUN  --   < > 30* 28* 21*  CREATININE  --   < > 1.18* 1.04* 0.95  CALCIUM  --   < > 8.9 9.0 8.8*  PROT 6.8  --   --   --   --   ALBUMIN 3.4*  --   --   --   --   AST 31  --   --   --   --   ALT 14  --   --   --   --   ALKPHOS 103  --   --   --   --   BILITOT 0.6  --   --   --   --   GFRNONAA  --   < > 45* 52* 58*  GFRAA  --   < > 52* >60 >60  ANIONGAP  --   < > 7 8 8   < > = values in this interval not displayed.   Hematology  Recent Labs Lab 03/18/17 0355 03/19/17 0440 03/20/17 0417  WBC 14.1* 9.6 8.2  RBC 3.21* 4.06 4.19  HGB 7.7* 10.4* 10.8*  HCT 25.8* 33.3* 33.9*  MCV 80.4 82.0 80.9  MCH 24.0* 25.6* 25.8*  MCHC 29.8* 31.2 31.9  RDW 16.8* 16.4* 16.2*  PLT 380 326 359    Cardiac Enzymes  Recent Labs Lab 03/15/17 2303 03/16/17 0422 03/16/17 1014  TROPONINI 0.06* 0.05* 0.03*     Recent Labs Lab 03/15/17 1517  TROPIPOC 0.05     BNP  Recent Labs Lab 03/15/17 1717  BNP 2,545.7*     DDimer   Recent Labs Lab 03/15/17 1717  DDIMER 1.61*     Radiology    No results found.  Cardiac Studies   Echo 03/16/17 Study Conclusions  - Left ventricle: LVEF is approximately 25% with akinesis of the   distal 1/3 to1/2 of the ventricle. The cavity size was mildly   dilated. Wall thickness  was normal. Features are consistent with   a pseudonormal left ventricular filling pattern, with concomitant   abnormal relaxation and increased filling pressure (grade 2   diastolic dysfunction). - Left atrium: The atrium was mildly dilated.  Patient Profile     71 y.o. female obesity, smoker (50-75 pack years), hyperlipidemia, COPD, CAD with hx of stenting, diabetes managed with diet, hypertension, combined sys/diastolic heart failure, claudication, PAF, anemia and drug-seeking behavior  presented to the ER on 03/15/17 with complaints of SOB for the past few days with occasional cough.  She was admitted 01/26/17 with COPD exacerbation, CAP, afib with RVR, acute respiratory failure w/hypoxia and acute anemia with possible GI bleed (+ FOBT). EGD showed nonbleeding gastric ulcers with clean ulcer base, non-bleeding erosive duodenum. Hold ASA and plavix.    Assessment & Plan    1. Acute on chronic combined CHF SOB has improved. Net out 3.6L Plan for LHC/RHC on Monday to further define anatomy and pressures. Anemia has stabilized after transfusion.  2. Anemia Clean ulcer found on EGD. No further drop in Hb after 2 units PRBC.  3. PAF CHADS2VaSC 5. If further AF will need anticoagulation. Had AF during respiratory illness.  4. HTN BP stable  5. Elevated D-dimer No PE found  6. DM Per primary    7. HLD - 04/04/2016: Cholesterol 131; HDL 57; LDL Cholesterol 66; Triglycerides 39; VLDL 8  Continue statin  Signed, Will Meredith Leeds, MD  03/20/2017, 8:07 AM

## 2017-03-21 NOTE — Telephone Encounter (Signed)
Looks like pt had L heart-cath yesterday and is still in the hospital. Can you write letter? Please advise

## 2017-03-22 ENCOUNTER — Encounter: Payer: Self-pay | Admitting: Cardiovascular Disease

## 2017-03-22 LAB — BASIC METABOLIC PANEL
ANION GAP: 6 (ref 5–15)
BUN: 16 mg/dL (ref 6–20)
CALCIUM: 8.7 mg/dL — AB (ref 8.9–10.3)
CO2: 32 mmol/L (ref 22–32)
Chloride: 97 mmol/L — ABNORMAL LOW (ref 101–111)
Creatinine, Ser: 0.95 mg/dL (ref 0.44–1.00)
GFR calc Af Amer: >60 mL/min (ref >60)
GFR, EST NON AFRICAN AMERICAN: 58 mL/min — AB (ref >60)
Glucose, Bld: 97 mg/dL (ref 65–99)
Potassium: 3.7 mmol/L (ref 3.5–5.1)
Sodium: 135 mmol/L (ref 135–145)

## 2017-03-22 LAB — CBC
HCT: 35.5 % — ABNORMAL LOW (ref 36.0–46.0)
Hemoglobin: 11.1 g/dL — ABNORMAL LOW (ref 12.0–15.0)
MCH: 25.5 pg — ABNORMAL LOW (ref 26.0–34.0)
MCHC: 31.3 g/dL (ref 30.0–36.0)
MCV: 81.4 fL (ref 78.0–100.0)
PLATELETS: 375 10*3/uL (ref 150–400)
RBC: 4.36 MIL/uL (ref 3.87–5.11)
RDW: 16.3 % — AB (ref 11.5–15.5)
WBC: 9.7 10*3/uL (ref 4.0–10.5)

## 2017-03-22 LAB — GLUCOSE, CAPILLARY
GLUCOSE-CAPILLARY: 151 mg/dL — AB (ref 65–99)
Glucose-Capillary: 116 mg/dL — ABNORMAL HIGH (ref 65–99)

## 2017-03-22 MED ORDER — CARVEDILOL 3.125 MG PO TABS: 3.1250 mg | ORAL_TABLET | Freq: Two times a day (BID) | ORAL | 0 refills | Status: DC

## 2017-03-22 MED ORDER — FUROSEMIDE 20 MG PO TABS: 20.0000 mg | ORAL_TABLET | Freq: Every day | ORAL | 0 refills | Status: DC

## 2017-03-22 MED ORDER — IPRATROPIUM-ALBUTEROL 0.5-2.5 (3) MG/3ML IN SOLN: 3.0000 mL | RESPIRATORY_TRACT | Status: DC | PRN

## 2017-03-22 NOTE — Progress Notes (Signed)
Physical Therapy Treatment Patient Details Name: Kimberly Downs MRN: [id number] DOB: 03-Apr-1946 Today's Date: 03/22/2017    History of Present Illness 71 y.o. female with onset of CHF and cardiomegaly, flat elevated troponin, atelectasis and increased LE edema was admitted.  Has tachycardia, drops in BP, pulm effusion.  PMHx:  ASCVD, stent, AA angiogram, DM, HTN, HLD, COPD, bronchitis, a-fib, respiratory failure with intubation, emphysema,    PT Comments    Pt making good progress and able to increase ambulation distance and work on stair training today.  Pt is anxious to get off of RW and discussed HHPT and their role in helping advance her mobility.  Pt verbalized understanding.   Follow Up Recommendations  Home health PT;Supervision for mobility/OOB     Equipment Recommendations  Rolling walker with 5" wheels (if pt does not have one in good repair)    Recommendations for Other Services       Precautions / Restrictions Precautions Precautions: Fall Restrictions Weight Bearing Restrictions: No    Mobility  Bed Mobility Overal bed mobility: Modified Independent                Transfers Overall transfer level: Modified independent   Transfers: Sit to/from Stand Sit to Stand: Modified independent (Device/Increase time)            Ambulation/Gait Ambulation/Gait assistance: Supervision;Min guard Ambulation Distance (Feet): 425 Feet Assistive device: Rolling walker (2 wheeled) Gait Pattern/deviations: Step-through pattern;Decreased stride length;Trunk flexed     General Gait Details: no c/o dizziness today.  min/guard initially and progressed to S   Stairs Stairs: Yes   Stair Management: One rail Left;Step to pattern;Sideways;Forwards Number of Stairs: 5 General stair comments: Pt performed 2 steps sideways with B UE to see how it felt  Wheelchair Mobility    Modified Rankin (Stroke Patients Only)       Balance     Sitting balance-Leahy Scale:  Good       Standing balance-Leahy Scale: Fair                              Cognition Arousal/Alertness: Awake/alert                                            Exercises      General Comments        Pertinent Vitals/Pain Pain Assessment: No/denies pain    Home Living                      Prior Function            PT Goals (current goals can now be found in the care plan section) Acute Rehab PT Goals Patient Stated Goal: to go home  PT Goal Formulation: With patient Time For Goal Achievement: 03/30/17 Potential to Achieve Goals: Good Progress towards PT goals: Progressing toward goals    Frequency    Min 3X/week      PT Plan Current plan remains appropriate    Co-evaluation              AM-PAC PT "6 Clicks" Daily Activity  Outcome Measure  Difficulty turning over in bed (including adjusting bedclothes, sheets and blankets)?: None Difficulty moving from lying on back to sitting on the side of the bed? : None Difficulty sitting down on  and standing up from a chair with arms (e.g., wheelchair, bedside commode, etc,.)?: None Help needed moving to and from a bed to chair (including a wheelchair)?: A Little Help needed walking in hospital room?: A Little Help needed climbing 3-5 steps with a railing? : A Little 6 Click Score: 21    End of Session Equipment Utilized During Treatment: Gait belt Activity Tolerance: Patient tolerated treatment well Patient left: Other (comment);with call bell/phone within reach (bathroom)   PT Visit Diagnosis: Unsteadiness on feet (R26.81);Muscle weakness (generalized) (M62.81)     Time: 8887-5797 PT Time Calculation (min) (ACUTE ONLY): 16 min  Charges:  $Gait Training: 8-22 mins                    G Codes:       Tahja Liao L. Tamala Julian, [place] Pager [phone] 03/22/2017    Galen Manila 03/22/2017, 11:38 AM

## 2017-03-22 NOTE — Discharge Summary (Addendum)
Physician Discharge Summary  Elon Lomeli BMW:[id number] DOB: 07/11/1946 DOA: 03/15/2017  PCP: [name], DO  Admit date: 03/15/2017 Discharge date: 03/22/2017  Admitted From:Home Disposition:home  Recommendations for Outpatient Follow-up:  1. Follow up with PCP and cardiologist in 1-2 weeks 2. Please obtain BMP/CBC in one week  Home Health:yes Equipment/Devices:none Discharge Condition:stable CODE STATUS:full code Diet recommendation:heart healthy  Brief/Interim Summary: 71 y.o.femalewith medical history significant for COPD, coronary artery disease with stenting, diabetes mellitus with diet control, hypertension, combined systolic/diastolic CHF, and drug-seeking behavior, now presenting to the emergency department from home for evaluation of shortness of breath and right lateral chest wall pain.  # Acute on chronic combined systolic and diastolic CHF (congestive heart failure) ([place]): -Patient presented with dyspnea on exertion, pedal edema and nonspecific chest pain. Chest x-ray with probable mild CHF and interstitial edema. BNP elevated to 2545. CT angiogram negative for PE -Treated with IV Lasix with improvement in symptoms. Patient is net negative by about 4 L. Medications adjusted in the hospital. Evaluated by cardiologist. She is tolerating well. -Echocardiogram with reduced EF of 25% with akinesis of the distal left ventricle.  -Patient had cardiac cath with unremarkable coronaries but severe left ventricular dysfunction consistent with nonischemic dilated cardiomyopathy. Optimize medications. Recommended to follow-up with her cardiologist outpatient.  # Normocytic anemia: concerning for GI bleed. Patient recently had upper GI endoscopy with gastric ulcer with active bleeding.  -Received 2 units of red blood cell transfusion with stable hemoglobin. Patient denied any bleeding. Evaluated by GI recommended no endoscopy evaluation at this time. Continue Protonix and supportive  care.   #Possible COPD acute exacerbation: Patient is active smoker. Education provided to the patient. -Clinically stable. Currently on room air.  #Acute respiratory failure with hypoxia improved. Currently in room air.  #History of hypertension: Blood pressure control.  continue current Lasix and cardiac medications.  #Chest pain: CT chest to rule out PE. Continue to treat for CHF and follow the echo. Chest x-ray negative for acute bony abnormalities. Mild elevation in troponin likely due to CHF.  #Pain management:  improved  # leukocytosis and thrombocytosis: Improved.  Discharge Diagnoses:  Principal Problem:   Acute on chronic combined systolic and diastolic CHF (congestive heart failure) ([place]): EF down from 40-45% to ~25% Active Problems:   Anemia associated with acute blood loss - GI Bleed   DM (diabetes mellitus), type 2 with renal complications (HCC)   Essential hypertension   Coronary artery disease involving native coronary artery of native heart with angina pectoris (HCC)   Pain in the chest   COPD exacerbation (HCC)   Hyperlipidemia with target low density lipoprotein (LDL) cholesterol less than 70 mg/dL   History of coronary artery stent placement   Drug-seeking behavior   Acute respiratory failure with hypoxia (HCC)   PAF (paroxysmal atrial fibrillation) [facility])    Discharge Instructions  Discharge Instructions    (HEART FAILURE PATIENTS) Call MD:  Anytime you have any of the following symptoms: 1) 3 pound weight gain in 24 hours or 5 pounds in 1 week 2) shortness of breath, with or without a dry hacking cough 3) swelling in the hands, feet or stomach 4) if you have to sleep on extra pillows at night in order to breathe.    Complete by:  As directed    Call MD for:  difficulty breathing, headache or visual disturbances    Complete by:  As directed    Call MD for:  extreme fatigue  Complete by:  As directed    Call MD for:  hives    Complete by:  As  directed    Call MD for:  persistant dizziness or light-headedness    Complete by:  As directed    Call MD for:  persistant nausea and vomiting    Complete by:  As directed    Call MD for:  severe uncontrolled pain    Complete by:  As directed    Call MD for:  temperature >100.4    Complete by:  As directed    Diet - low sodium heart healthy    Complete by:  As directed    Increase activity slowly    Complete by:  As directed      Allergies as of 03/22/2017      Reactions   Dicyclomine Other (See Comments)   Reaction:  Agitation    Morphine And Related Nausea And Vomiting   Nsaids Nausea And Vomiting   Toradol [ketorolac Tromethamine] Nausea And Vomiting   Levaquin [levofloxacin] Nausea And Vomiting, Rash   Metronidazole Nausea And Vomiting, Rash      Medication List    STOP taking these medications   irbesartan-hydrochlorothiazide 150-12.5 MG tablet Commonly known as:  AVALIDE   verapamil 180 MG 24 hr capsule Commonly known as:  VERELAN PM     TAKE these medications   atorvastatin 10 MG tablet Commonly known as:  LIPITOR Take 10 mg by mouth at bedtime.   budesonide-formoterol 160-4.5 MCG/ACT inhaler Commonly known as:  SYMBICORT Inhale 2 puffs into the lungs 2 (two) times daily.   carvedilol 3.125 MG tablet Commonly known as:  COREG Take 1 tablet (3.125 mg total) by mouth 2 (two) times daily with a meal.   clopidogrel 75 MG tablet Commonly known as:  PLAVIX Take 75 mg by mouth daily.   cyanocobalamin 2000 MCG tablet Commonly known as:  [employer] VITAMIN B12 Take 1 tablet (2,000 mcg total) by mouth daily.   furosemide 20 MG tablet Commonly known as:  LASIX Take 1 tablet (20 mg total) by mouth daily.   ipratropium-albuterol 0.5-2.5 (3) MG/3ML Soln Commonly known as:  DUONEB Take 3 mLs by nebulization 3 (three) times daily. DX: J44.9   isosorbide mononitrate 30 MG 24 hr tablet Commonly known as:  IMDUR TAKE 1 TABLET (30 MG TOTAL) BY MOUTH DAILY.    pantoprazole 40 MG tablet Commonly known as:  PROTONIX Take 40 mg by mouth daily as needed (for acid reflux).   pentoxifylline 400 MG CR tablet Commonly known as:  TRENTAL Take 400 mg by mouth daily.   predniSONE 10 MG tablet Commonly known as:  DELTASONE Take 1 tablet (10 mg total) by mouth as directed. Take 15 mg by mouth for the next 3 days, then take 40 mg by mouth for the next 3 days, then take 30 mg by mouth for the next 3 days, then take 20 mg by mouth for the next 3 days then take 10 mg by mouth daily. Please follow-up with your pulmonologist to see if you should continue on 10 mg daily   rOPINIRole 2 MG tablet Commonly known as:  REQUIP Take 2 mg by mouth at bedtime.      Follow-up Information    Home, [facility] Follow up.   Specialty:  [place] Why:  They will do your home health care Contact information: [address] [place] [address] [phone]  Le, Thao P, DO. Go on 03/29/2017.   Specialty:  Family Medicine Why:  @2 :15pm Contact information: [place] [address] [phone]        Lorretta Harp, MD. Go on 04/05/2017.   Specialties:  Cardiology, Radiology Why:  @3 :15pm Contact information: [address] [place]  [address] [phone]          Allergies  Allergen Reactions  . Dicyclomine Other (See Comments)    Reaction:  Agitation   . Morphine And Related Nausea And Vomiting  . Nsaids Nausea And Vomiting  . Toradol [Ketorolac Tromethamine] Nausea And Vomiting  . Levaquin [Levofloxacin] Nausea And Vomiting and Rash  . Metronidazole Nausea And Vomiting and Rash    Consultations: Cardiology GI  Procedures/Studies: Echo Cardiac catheter  Subjective: seen and examined at bedside. Denied headache, dizziness, nausea vomiting chest pain or shortness of breath. Review go home today.  Discharge Exam: Vitals:   03/21/17 2013 03/22/17 0640  BP: 117/66 125/63  Pulse:  78 70  Resp: 18 16  Temp: 97.9 F (36.6 C) 98.4 F (36.9 C)   Vitals:   03/21/17 2013 03/21/17 2033 03/22/17 0640 03/22/17 0828  BP: 117/66  125/63   Pulse: 78  70   Resp: 18  16   Temp: 97.9 F (36.6 C)  98.4 F (36.9 C)   TempSrc: Oral  Oral   SpO2: 97% 97% 96% 93%  Weight:   52.2 kg (115 lb 1.6 oz)   Height:        General: Pt is alert, awake, not in acute distress Cardiovascular: RRR, S1/S2 +, no rubs, no gallops Respiratory: CTA bilaterally, no wheezing, no rhonchi Abdominal: Soft, NT, ND, bowel sounds + Extremities: no edema, no cyanosis    The results of significant diagnostics from this hospitalization (including imaging, microbiology, ancillary and laboratory) are listed below for reference.     Microbiology: No results found for this or any previous visit (from the past 240 hour(s)).   Labs: BNP (last 3 results)  Recent Labs  03/15/17 1717  BNP 2,025.4*   Basic Metabolic Panel:  Recent Labs Lab 03/18/17 0355 03/19/17 0440 03/20/17 0417 03/21/17 0823 03/22/17 0440  NA 136 137 139 136 135  K 3.6 3.5 3.6 3.8 3.7  CL 99* 97* 98* 97* 97*  CO2 30 32 33* 31 32  GLUCOSE 167* 114* 104* 130* 97  BUN 30* 28* 21* 13 16  CREATININE 1.18* 1.04* 0.95 0.98 0.95  CALCIUM 8.9 9.0 8.8* 8.9 8.7*  MG  --   --  2.1  --   --    Liver Function Tests:  Recent Labs Lab 03/15/17 1730  AST 31  ALT 14  ALKPHOS 103  BILITOT 0.6  PROT 6.8  ALBUMIN 3.4*   No results for input(s): LIPASE, AMYLASE in the last 168 hours. No results for input(s): AMMONIA in the last 168 hours. CBC:  Recent Labs Lab 03/18/17 0355 03/19/17 0440 03/20/17 0417 03/21/17 0823 03/22/17 0440  WBC 14.1* 9.6 8.2 9.0 9.7  HGB 7.7* 10.4* 10.8* 12.2 11.1*  HCT 25.8* 33.3* 33.9* 38.6 35.5*  MCV 80.4 82.0 80.9 81.3 81.4  PLT 380 326 359 374 375   Cardiac Enzymes:  Recent Labs Lab 03/15/17 2303 03/16/17 0422 03/16/17 1014  TROPONINI 0.06* 0.05* 0.03*   BNP: Invalid input(s):  POCBNP CBG:  Recent Labs Lab 03/21/17 1124 03/21/17 1647 03/21/17 2109 03/22/17 0740 03/22/17 1122  GLUCAP 111* 143* 85 116*  151*   D-Dimer No results for input(s): DDIMER in the last 72 hours. Hgb A1c No results for input(s): HGBA1C in the last 72 hours. Lipid Profile No results for input(s): CHOL, HDL, LDLCALC, TRIG, CHOLHDL, LDLDIRECT in the last 72 hours. Thyroid function studies No results for input(s): TSH, T4TOTAL, T3FREE, THYROIDAB in the last 72 hours.  Invalid input(s): FREET3 Anemia work up No results for input(s): VITAMINB12, FOLATE, FERRITIN, TIBC, IRON, RETICCTPCT in the last 72 hours. Urinalysis    Component Value Date/Time   COLORURINE YELLOW 02/03/2016 1309   APPEARANCEUR CLEAR 02/03/2016 1309   LABSPEC 1.011 02/03/2016 1309   PHURINE 5.5 02/03/2016 1309   GLUCOSEU NEGATIVE 02/03/2016 1309   HGBUR LARGE (A) 02/03/2016 1309   BILIRUBINUR NEGATIVE 02/03/2016 1309   KETONESUR NEGATIVE 02/03/2016 1309   PROTEINUR NEGATIVE 02/03/2016 1309   UROBILINOGEN 0.2 05/25/2015 1400   NITRITE NEGATIVE 02/03/2016 1309   LEUKOCYTESUR NEGATIVE 02/03/2016 1309   Sepsis Labs Invalid input(s): PROCALCITONIN,  WBC,  LACTICIDVEN Microbiology No results found for this or any previous visit (from the past 240 hour(s)).   Time coordinating discharge: 33 minutes  SIGNED:   Rosita Fire, MD  [facility] 03/22/2017, 12:33 PM  If 7PM-7AM, please contact night-coverage [email or web address] Password [email or web address]

## 2017-03-22 NOTE — Care Management Important Message (Signed)
Important Message  Patient Details  Name: Kimberly Downs MRN: [id number] Date of Birth: Dec 24, 1945   Medicare Important Message Given:  Yes    [name] Montine Circle 03/22/2017, 11:47 AM

## 2017-03-22 NOTE — Progress Notes (Signed)
Progress Note  Patient Name: Kimberly Downs Date of Encounter: 03/22/2017  Primary Cardiologist: Gwenlyn Found  Subjective   Pt denies CP  Breathing is OK    Inpatient Medications    Scheduled Meds: . atorvastatin  10 mg Oral QHS  . carvedilol  3.125 mg Oral BID WC  . clopidogrel  75 mg Oral Daily  . docusate sodium  100 mg Oral Daily  . furosemide  20 mg Oral Daily  . insulin aspart  0-15 Units Subcutaneous TID WC  . insulin aspart  0-5 Units Subcutaneous QHS  . ipratropium-albuterol  3 mL Nebulization TID  . methocarbamol  500 mg Oral TID  . mometasone-formoterol  2 puff Inhalation BID  . pantoprazole  40 mg Oral Daily  . pentoxifylline  400 mg Oral Daily  . polyethylene glycol  17 g Oral Daily  . rOPINIRole  2 mg Oral QHS  . senna-docusate  1 tablet Oral BID  . sodium chloride flush  3 mL Intravenous Q12H  . cyanocobalamin  2,000 mcg Oral Daily   Continuous Infusions: . sodium chloride Stopped (03/22/17 0652)   PRN Meds: sodium chloride, acetaminophen, hydrALAZINE, ondansetron (ZOFRAN) IV, sodium chloride flush, traMADol   Vital Signs    Vitals:   03/21/17 2013 03/21/17 2033 03/22/17 0640 03/22/17 0828  BP: 117/66  125/63   Pulse: 78  70   Resp: 18  16   Temp: 97.9 F (36.6 C)  98.4 F (36.9 C)   TempSrc: Oral  Oral   SpO2: 97% 97% 96% 93%  Weight:   115 lb 1.6 oz (52.2 kg)   Height:        Intake/Output Summary (Last 24 hours) at 03/22/17 1213 Last data filed at 03/22/17 0845  Gross per 24 hour  Intake          1193.17 ml  Output              900 ml  Net           293.17 ml   Filed Weights   03/20/17 0541 03/21/17 0636 03/22/17 0640  Weight: 115 lb (52.2 kg) 115 lb 3 oz (52.2 kg) 115 lb 1.6 oz (52.2 kg)    Telemetry    SR   - Personally Reviewed  ECG      Physical Exam   GEN: No acute distress.   Neck: No JVD Cardiac: RRR, no murmurs, rubs, or gallops.  Respiratory: Clear to auscultation bilaterally. GI: Soft, nontender, non-distended    MS: No edema; No deformity. Neuro:  Nonfocal  Psych: Normal affect   Labs    Chemistry Recent Labs Lab 03/15/17 1730  03/20/17 0417 03/21/17 0823 03/22/17 0440  NA  --   < > 139 136 135  K  --   < > 3.6 3.8 3.7  CL  --   < > 98* 97* 97*  CO2  --   < > 33* 31 32  GLUCOSE  --   < > 104* 130* 97  BUN  --   < > 21* 13 16  CREATININE  --   < > 0.95 0.98 0.95  CALCIUM  --   < > 8.8* 8.9 8.7*  PROT 6.8  --   --   --   --   ALBUMIN 3.4*  --   --   --   --   AST 31  --   --   --   --   ALT 14  --   --   --   --  ALKPHOS 103  --   --   --   --   BILITOT 0.6  --   --   --   --   GFRNONAA  --   < > 58* 56* 58*  GFRAA  --   < > >60 >60 >60  ANIONGAP  --   < > 8 8 6   < > = values in this interval not displayed.   Hematology Recent Labs Lab 03/20/17 0417 03/21/17 0823 03/22/17 0440  WBC 8.2 9.0 9.7  RBC 4.19 4.75 4.36  HGB 10.8* 12.2 11.1*  HCT 33.9* 38.6 35.5*  MCV 80.9 81.3 81.4  MCH 25.8* 25.7* 25.5*  MCHC 31.9 31.6 31.3  RDW 16.2* 16.2* 16.3*  PLT 359 374 375    Cardiac Enzymes Recent Labs Lab 03/15/17 2303 03/16/17 0422 03/16/17 1014  TROPONINI 0.06* 0.05* 0.03*    Recent Labs Lab 03/15/17 1517  TROPIPOC 0.05     BNP Recent Labs Lab 03/15/17 1717  BNP 2,545.7*     DDimer  Recent Labs Lab 03/15/17 1717  DDIMER 1.61*     Radiology    No results found.  Cardiac Studies    Patient Profile     71 y.o.   Assessment & Plan    1  Acute on chronic systolic CHF  Cath yesterday with minimal CAD   LVEDP 3   Keep on curren medical Rx  Will need f/u in long term of LVEF as outpt  2  Anemia  Will need to be followed  3  PAF  Pt had afib during respiratory illness  If recurs will need anticoaglation  4  HTN  OK  5  HL  Continue statin    OK to d/c home with outpt f/u    Signed, Dorris Carnes, MD  03/22/2017, 12:13 PM

## 2017-03-22 NOTE — Telephone Encounter (Signed)
Can you take care of this please??

## 2017-03-22 NOTE — Telephone Encounter (Signed)
Letter mailed to address provided.

## 2017-03-22 NOTE — Progress Notes (Signed)
Pt has orders to be discharged. Discharge instructions given and pt has no additional questions at this time. Medication regimen reviewed and pt educated. Pt verbalized understanding and has no additional questions. Telemetry box removed. IV removed and site in good condition. Pt stable and waiting for transportation. 

## 2017-03-29 DIAGNOSIS — I509 Heart failure, unspecified: Secondary | ICD-10-CM | POA: Diagnosis not present

## 2017-03-29 DIAGNOSIS — J449 Chronic obstructive pulmonary disease, unspecified: Secondary | ICD-10-CM | POA: Diagnosis not present

## 2017-03-29 DIAGNOSIS — D649 Anemia, unspecified: Secondary | ICD-10-CM | POA: Diagnosis not present

## 2017-04-05 ENCOUNTER — Ambulatory Visit: Payer: Medicare Other | Admitting: Cardiovascular Disease

## 2017-04-12 DIAGNOSIS — J449 Chronic obstructive pulmonary disease, unspecified: Secondary | ICD-10-CM | POA: Diagnosis not present

## 2017-04-12 DIAGNOSIS — D649 Anemia, unspecified: Secondary | ICD-10-CM | POA: Diagnosis not present

## 2017-04-12 DIAGNOSIS — I509 Heart failure, unspecified: Secondary | ICD-10-CM | POA: Diagnosis not present

## 2017-04-25 ENCOUNTER — Telehealth: Payer: Self-pay | Admitting: Cardiovascular Disease

## 2017-04-25 NOTE — Telephone Encounter (Signed)
New message    Pt is calling about a letter that was mailed for her sister to reschedule plan tickets. She said in the letter, it was not said that she was her sister with her sisters name and address. Please call pt.

## 2017-04-25 NOTE — Telephone Encounter (Signed)
Returned the call to the patient. She stated that a letter was sent to her sister around 3-4 weeks ago regarding a plane ticket. The letter stated that patient was in the hospital but it needs to states somewhere that the patient's sister is "Alfonzo Beers" so that her sister can get reimbursed for this airline ticker.  Her address is:  [place].

## 2017-04-27 ENCOUNTER — Encounter: Payer: Self-pay | Admitting: Cardiovascular Disease

## 2017-04-27 NOTE — Telephone Encounter (Signed)
Letter addended and maiiled to pt sister and address provided.

## 2017-05-02 DIAGNOSIS — R51 Headache: Secondary | ICD-10-CM | POA: Diagnosis not present

## 2017-05-02 DIAGNOSIS — I10 Essential (primary) hypertension: Secondary | ICD-10-CM | POA: Diagnosis not present

## 2017-05-05 ENCOUNTER — Ambulatory Visit (HOSPITAL_BASED_OUTPATIENT_CLINIC_OR_DEPARTMENT_OTHER)
Admission: RE | Admit: 2017-05-05 | Discharge: 2017-05-05 | Disposition: A | Discharge status: Home / Self Care | Payer: Medicare Other | Source: Ambulatory Visit | Attending: Family Medicine | Admitting: Family Medicine

## 2017-05-05 ENCOUNTER — Other Ambulatory Visit: Payer: Self-pay | Admitting: Family Medicine

## 2017-05-05 DIAGNOSIS — R51 Headache: Principal | ICD-10-CM

## 2017-05-05 DIAGNOSIS — I2510 Atherosclerotic heart disease of native coronary artery without angina pectoris: Secondary | ICD-10-CM | POA: Diagnosis not present

## 2017-05-05 DIAGNOSIS — R519 Headache, unspecified: Secondary | ICD-10-CM

## 2017-05-05 DIAGNOSIS — E1140 Type 2 diabetes mellitus with diabetic neuropathy, unspecified: Secondary | ICD-10-CM | POA: Diagnosis not present

## 2017-05-05 DIAGNOSIS — R9082 White matter disease, unspecified: Secondary | ICD-10-CM | POA: Insufficient documentation

## 2017-05-05 DIAGNOSIS — J449 Chronic obstructive pulmonary disease, unspecified: Secondary | ICD-10-CM | POA: Diagnosis not present

## 2017-05-05 DIAGNOSIS — M353 Polymyalgia rheumatica: Secondary | ICD-10-CM | POA: Diagnosis not present

## 2017-05-05 DIAGNOSIS — I6782 Cerebral ischemia: Secondary | ICD-10-CM | POA: Diagnosis not present

## 2017-05-05 DIAGNOSIS — F419 Anxiety disorder, unspecified: Secondary | ICD-10-CM | POA: Diagnosis not present

## 2017-05-05 DIAGNOSIS — I119 Hypertensive heart disease without heart failure: Secondary | ICD-10-CM | POA: Diagnosis not present

## 2017-05-05 DIAGNOSIS — I509 Heart failure, unspecified: Secondary | ICD-10-CM | POA: Diagnosis not present

## 2017-05-20 ENCOUNTER — Encounter: Payer: Self-pay | Admitting: Pulmonary Disease

## 2017-05-20 ENCOUNTER — Ambulatory Visit (INDEPENDENT_AMBULATORY_CARE_PROVIDER_SITE_OTHER): Payer: Medicare Other | Admitting: Pulmonary Disease

## 2017-05-20 VITALS — BP 132/72 | HR 80 | Ht 59.0 in | Wt 113.0 lb

## 2017-05-20 DIAGNOSIS — J449 Chronic obstructive pulmonary disease, unspecified: Secondary | ICD-10-CM | POA: Diagnosis not present

## 2017-05-20 DIAGNOSIS — Z720 Tobacco use: Secondary | ICD-10-CM

## 2017-05-20 DIAGNOSIS — J441 Chronic obstructive pulmonary disease with (acute) exacerbation: Secondary | ICD-10-CM | POA: Diagnosis not present

## 2017-05-20 DIAGNOSIS — Z23 Encounter for immunization: Secondary | ICD-10-CM | POA: Diagnosis not present

## 2017-05-20 LAB — PULMONARY FUNCTION TEST
DL/VA % pred: 73 %
DL/VA: 3.04 ml/min/mmHg/L
DLCO COR: 11.03 ml/min/mmHg
DLCO UNC % PRED: 68 %
DLCO UNC: 12.53 ml/min/mmHg
DLCO cor % pred: 60 %
FEF 25-75 POST: 1.43 L/s
FEF 25-75 PRE: 0.54 L/s
FEF2575-%CHANGE-POST: 164 %
FEF2575-%PRED-POST: 93 %
FEF2575-%PRED-PRE: 35 %
FEV1-%Change-Post: 37 %
FEV1-%PRED-POST: 71 %
FEV1-%Pred-Pre: 51 %
FEV1-PRE: 0.92 L
FEV1-Post: 1.27 L
FEV1FVC-%Change-Post: 7 %
FEV1FVC-%PRED-PRE: 88 %
FEV6-%CHANGE-POST: 28 %
FEV6-%PRED-POST: 79 %
FEV6-%Pred-Pre: 61 %
FEV6-POST: 1.79 L
FEV6-PRE: 1.39 L
FEV6FVC-%PRED-POST: 105 %
FEV6FVC-%Pred-Pre: 105 %
FVC-%Change-Post: 28 %
FVC-%Pred-Post: 75 %
FVC-%Pred-Pre: 58 %
FVC-Post: 1.79 L
FVC-Pre: 1.39 L
POST FEV1/FVC RATIO: 71 %
PRE FEV1/FVC RATIO: 66 %
Post FEV6/FVC ratio: 100 %
Pre FEV6/FVC Ratio: 100 %
RV % PRED: 133 %
RV: 2.68 L
TLC % pred: 94 %
TLC: 4.16 L

## 2017-05-20 NOTE — Progress Notes (Signed)
PFT done today. 
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
## 2017-05-20 NOTE — Patient Instructions (Signed)
Cigarette smoking: Stop smoking right away Refer to the smoking cessation information sheet reviewed We will refer you to the lung cancer screening program  COPD: Use Symbicort 2 puffs twice a day no matter how you feel You can continue using the DuoNeb 3 times a day or as needed for chest tightness wheezing or shortness of breath Flu shot Prevnar vaccine  Follow-up in 3-4 months or sooner if needed

## 2017-05-31 ENCOUNTER — Encounter: Payer: Self-pay | Admitting: Cardiovascular Disease

## 2017-05-31 ENCOUNTER — Ambulatory Visit (INDEPENDENT_AMBULATORY_CARE_PROVIDER_SITE_OTHER): Payer: Medicare Other | Admitting: Cardiovascular Disease

## 2017-05-31 VITALS — BP 144/90 | HR 80 | Ht 60.5 in | Wt 115.0 lb

## 2017-05-31 DIAGNOSIS — Z720 Tobacco use: Secondary | ICD-10-CM | POA: Diagnosis not present

## 2017-05-31 DIAGNOSIS — I5043 Acute on chronic combined systolic (congestive) and diastolic (congestive) heart failure: Secondary | ICD-10-CM

## 2017-05-31 DIAGNOSIS — I209 Angina pectoris, unspecified: Secondary | ICD-10-CM

## 2017-05-31 DIAGNOSIS — E785 Hyperlipidemia, unspecified: Secondary | ICD-10-CM

## 2017-05-31 DIAGNOSIS — I519 Heart disease, unspecified: Secondary | ICD-10-CM | POA: Diagnosis not present

## 2017-05-31 DIAGNOSIS — I25119 Atherosclerotic heart disease of native coronary artery with unspecified angina pectoris: Secondary | ICD-10-CM | POA: Diagnosis not present

## 2017-05-31 DIAGNOSIS — I4891 Unspecified atrial fibrillation: Secondary | ICD-10-CM | POA: Diagnosis not present

## 2017-05-31 NOTE — Assessment & Plan Note (Signed)
History of severe LV dysfunction with an EF of 25%. She had clean coronary sick cath in July. She was on carvedilol which was discontinued by her PCP and switch to verapamil. We will recheck a 2-D echocardiogram. Currently she does not appear volume overloaded.

## 2017-05-31 NOTE — Patient Instructions (Signed)
Medication Instructions: Your physician recommends that you continue on your current medications as directed. Please refer to the Current Medication list given to you today.   Testing/Procedures: Your physician has requested that you have an echocardiogram. Echocardiography is a painless test that uses sound waves to create images of your heart. It provides your doctor with information about the size and shape of your heart and how well your heart's chambers and valves are working. This procedure takes approximately one hour. There are no restrictions for this procedure.  Follow-Up: We request that you follow-up in: 6 months with an extender and in 12 months with Dr Andria Rhein will receive a reminder letter in the mail two months in advance. If you don't receive a letter, please call our office to schedule the follow-up appointment.  If you need a refill on your cardiac medications before your next appointment, please call your pharmacy.

## 2017-05-31 NOTE — Assessment & Plan Note (Signed)
History of brief PAF during her episode of respiratory decompensation

## 2017-05-31 NOTE — Assessment & Plan Note (Signed)
History of hyperlipidemia on statin therapy followed by her PCP. 

## 2017-05-31 NOTE — Assessment & Plan Note (Signed)
History of essential hypertension blood pressure measured today at 144/90. She is on verapamil. Continue current meds at current dosing

## 2017-05-31 NOTE — Assessment & Plan Note (Signed)
History of continued tobacco abuse of less than one pack per day recalcitrant to risk factor modification.

## 2017-05-31 NOTE — Progress Notes (Signed)
05/31/2017 Kimberly Downs   December 16, 1945  [id number]  Primary Physician [name], DO Primary Cardiologist: Lorretta Harp MD Garret Reddish, [place], [place]  HPI:  Kimberly Downs is a 71 y.o. female mildly overweight weight recently widowed Caucasian female mother of one son who is here in [place] who I last saw in the office 07/28/16.  She has a history of coronary stenting in [place] approximately 7 years ago apparently did not have a myocardial infarction. Her cardiac risk factors include continued tobacco abuse. Strong family history, diabetes, hypertension and hyperlipidemia. She gets occasional chest pain. She also complains of claudication. She says several months of postprandial abdominal pain with food avoidance and 30 pound weight loss. She's had multiple colonoscopies was told she's had C. Difficile on several occasions. Recent colonoscopy performed by Dr. Benson Norway did not show any mucosal changes of ischemic colitis. A abdominal CT however did show high-grade disease in the celiac axis, superior and inferior mesenteric artery suggesting an ischemic etiology. I performed abdominal aortography on 06/02/15 showing an most mild to moderate superior mesenteric artery stenosis and celiac stenosis. She did have a Myoview stress test performed at the time of that hospitalization which was nonischemic. She denies chest pain or shortness of breath. She does continue to smoke a half a pack per day and has a 50-75-pack-year history of tobacco abuse. She was admitted to the hospital 03/15/17 for a week with shortness of breath and heart failure. I performed cardiac catheterization on her on 03/21/17 revealed a widely patent proximal RCA stent with a LVEF of 25%. She was discharged him on carvedilol which was discontinued by her PCP.   Current Meds  Medication Sig  . atorvastatin (LIPITOR) 10 MG tablet Take 10 mg by mouth at bedtime.   . budesonide-formoterol (SYMBICORT) 160-4.5 MCG/ACT inhaler Inhale  2 puffs into the lungs 2 (two) times daily.  . clopidogrel (PLAVIX) 75 MG tablet Take 75 mg by mouth daily.  . cyanocobalamin ([employer] VITAMIN B12) 2000 MCG tablet Take 1 tablet (2,000 mcg total) by mouth daily.  . furosemide (LASIX) 20 MG tablet Take 1 tablet (20 mg total) by mouth daily.  [name] ipratropium-albuterol (DUONEB) 0.5-2.5 (3) MG/3ML SOLN Take 3 mLs by nebulization 3 (three) times daily. DX: J44.9  . isosorbide mononitrate (IMDUR) 30 MG 24 hr tablet TAKE 1 TABLET (30 MG TOTAL) BY MOUTH DAILY.  . pantoprazole (PROTONIX) 40 MG tablet Take 40 mg by mouth daily as needed (for acid reflux).  . pentoxifylline (TRENTAL) 400 MG CR tablet Take 400 mg by mouth daily.  . predniSONE (DELTASONE) 10 MG tablet Take 1 tablet (10 mg total) by mouth as directed. Take 15 mg by mouth for the next 3 days, then take 40 mg by mouth for the next 3 days, then take 30 mg by mouth for the next 3 days, then take 20 mg by mouth for the next 3 days then take 10 mg by mouth daily. Please follow-up with your pulmonologist to see if you should continue on 10 mg daily (Patient taking differently: Take 10 mg by mouth as directed. )  . rOPINIRole (REQUIP) 2 MG tablet Take 2 mg by mouth at bedtime.  . verapamil (CALAN) 40 MG tablet Take 40 mg by mouth daily.     Allergies  Allergen Reactions  . Dicyclomine Other (See Comments)    Reaction:  Agitation   . Morphine And Related Nausea And Vomiting  . Nsaids Nausea And Vomiting  .  Toradol [Ketorolac Tromethamine] Nausea And Vomiting  . Levaquin [Levofloxacin] Nausea And Vomiting and Rash  . Metronidazole Nausea And Vomiting and Rash    Social History   Social History  . Marital status: Widowed    Spouse name: N/A  . Number of children: 1  . Years of education: 75   Occupational History  . Retired - [employer] employee    Social History Main Topics  . Smoking status: Light Tobacco Smoker    Packs/day: 1.00    Years: 53.00    Last attempt to quit: 01/20/2017  . Smokeless  tobacco: Never Used     Comment: 1-2 cigs per day  . Alcohol use Yes     Comment: rare use  . Drug use: No  . Sexual activity: No   Other Topics Concern  . Not on file   Social History Narrative   Lives with son, Kimberly Downs   Caffeine use: 2 cups coffee/day      Review of Systems: General: negative for chills, fever, night sweats or weight changes.  Cardiovascular: negative for chest pain, dyspnea on exertion, edema, orthopnea, palpitations, paroxysmal nocturnal dyspnea or shortness of breath Dermatological: negative for rash Respiratory: negative for cough or wheezing Urologic: negative for hematuria Abdominal: negative for nausea, vomiting, diarrhea, bright red blood per rectum, melena, or hematemesis Neurologic: negative for visual changes, syncope, or dizziness All other systems reviewed and are otherwise negative except as noted above.    Blood pressure (!) 144/90, pulse 80, height 5' 0.5" (1.537 m), weight 115 lb (52.2 kg).  General appearance: alert and no distress Neck: no adenopathy, no carotid bruit, no JVD, supple, symmetrical, trachea midline and thyroid not enlarged, symmetric, no tenderness/mass/nodules Lungs: clear to auscultation bilaterally Heart: regular rate and rhythm, S1, S2 normal, no murmur, click, rub or gallop Extremities: extremities normal, atraumatic, no cyanosis or edema Pulses: 2+ and symmetric Skin: Skin color, texture, turgor normal. No rashes or lesions Neurologic: Alert and oriented X 3, normal strength and tone. Normal symmetric reflexes. Normal coordination and gait  EKG not performed today  ASSESSMENT AND PLAN:   Essential hypertension History of essential hypertension blood pressure measured today at 144/90. She is on verapamil. Continue current meds at current dosing  Coronary artery disease involving native coronary artery of native heart with angina pectoris ([place]) History of coronary artery disease status post RCA stenting in [place] 78  years ago. I performed cardiac catheterization on her 03/21/17 in the setting of systolic heart failure demonstrating an EF of 25% with a patent proximal RCA stent. Echo therapy was recommended. She denies chest pain or shortness of breath.  Hyperlipidemia with target low density lipoprotein (LDL) cholesterol less than 70 mg/dL History of hyperlipidemia on statin therapy followed by her PCP  Tobacco abuse History of continued tobacco abuse of less than one pack per day recalcitrant to risk factor modification.  Atrial fibrillation with RVR (HCC) History of brief PAF during her episode of respiratory decompensation  Acute on chronic combined systolic and diastolic CHF (congestive heart failure) ([place]): EF down from 40-45% to ~25% History of severe LV dysfunction with an EF of 25%. She had clean coronary sick cath in July. She was on carvedilol which was discontinued by her PCP and switch to verapamil. We will recheck a 2-D echocardiogram. Currently she does not appear volume overloaded.      Lorretta Harp MD FACP,FACC,FAHA, [facility] 05/31/2017 10:55 AM

## 2017-05-31 NOTE — Assessment & Plan Note (Signed)
History of coronary artery disease status post RCA stenting in [place] 78 years ago. I performed cardiac catheterization on her 03/21/17 in the setting of systolic heart failure demonstrating an EF of 25% with a patent proximal RCA stent. Echo therapy was recommended. She denies chest pain or shortness of breath.

## 2017-06-03 DIAGNOSIS — Z79899 Other long term (current) drug therapy: Secondary | ICD-10-CM | POA: Diagnosis not present

## 2017-06-03 DIAGNOSIS — L84 Corns and callosities: Secondary | ICD-10-CM | POA: Diagnosis not present

## 2017-06-03 DIAGNOSIS — M62838 Other muscle spasm: Secondary | ICD-10-CM | POA: Diagnosis not present

## 2017-06-03 DIAGNOSIS — R42 Dizziness and giddiness: Secondary | ICD-10-CM | POA: Diagnosis not present

## 2017-06-10 ENCOUNTER — Other Ambulatory Visit (HOSPITAL_COMMUNITY): Payer: Medicare Other

## 2017-06-16 ENCOUNTER — Other Ambulatory Visit: Payer: Self-pay

## 2017-06-16 ENCOUNTER — Ambulatory Visit (HOSPITAL_COMMUNITY): Payer: Medicare Other | Attending: Cardiovascular Disease

## 2017-06-16 DIAGNOSIS — I5043 Acute on chronic combined systolic (congestive) and diastolic (congestive) heart failure: Secondary | ICD-10-CM | POA: Diagnosis not present

## 2017-06-16 DIAGNOSIS — I519 Heart disease, unspecified: Secondary | ICD-10-CM | POA: Insufficient documentation

## 2017-06-21 ENCOUNTER — Telehealth: Payer: Self-pay | Admitting: Cardiovascular Disease

## 2017-06-21 NOTE — Telephone Encounter (Signed)
Returned call to patient.Advised Dr.Berry wants discuss echo results.Advised to keep appointment with him 06/22/17 at 10:00 am.

## 2017-06-21 NOTE — Telephone Encounter (Signed)
New Message ° ° pt verbalized that she is returning call for rn °

## 2017-06-22 ENCOUNTER — Encounter: Payer: Self-pay | Admitting: Cardiovascular Disease

## 2017-06-22 ENCOUNTER — Ambulatory Visit (INDEPENDENT_AMBULATORY_CARE_PROVIDER_SITE_OTHER): Payer: Medicare Other | Admitting: Cardiovascular Disease

## 2017-06-22 VITALS — BP 146/85 | HR 76 | Ht 60.5 in | Wt 118.0 lb

## 2017-06-22 DIAGNOSIS — I255 Ischemic cardiomyopathy: Secondary | ICD-10-CM

## 2017-06-22 DIAGNOSIS — I2589 Other forms of chronic ischemic heart disease: Secondary | ICD-10-CM

## 2017-06-22 DIAGNOSIS — I428 Other cardiomyopathies: Secondary | ICD-10-CM | POA: Insufficient documentation

## 2017-06-22 MED ORDER — METOPROLOL TARTRATE 25 MG PO TABS: ORAL_TABLET | ORAL | 6 refills | Status: DC

## 2017-06-22 NOTE — Progress Notes (Signed)
History of left heart cath by myself 03/21/17 revealing a patent stent in her RCA with EF of 25% by visual estimate. She was initially placed on carvedilol which was discontinued because of cough by her PCP and she was begun on verapamil. Follow-up 2-D echo performed 06/16/17 continues to demonstrate severe LV dysfunction with an EF in the 25-30% range. She does have COPD and is limited by shortness of breath which may be multifactorial. I am going to begin her on low-dose metoprolol and refer her to Dr. Sallyanne Kuster to discuss the pros and cons of ICD therapy for primary prevention of sudden cardiac death.   Kimberly Downs, M.D., [place], [facility], [place] [address] [place], [place]  [address]  [phone] 06/22/2017 10:58 AM

## 2017-06-22 NOTE — Assessment & Plan Note (Signed)
History of left heart cath by myself 03/21/17 revealing a patent stent in her RCA with EF of 25% by visual estimate. She was initially placed on carvedilol which was discontinued because of cough by her PCP and she was begun on verapamil. Follow-up 2-D echo performed 06/16/17 continues to demonstrate severe LV dysfunction with an EF in the 25-30% range. She does have COPD and is limited by shortness of breath which may be multifactorial. I am going to begin her on low-dose metoprolol and refer her to Dr. Sallyanne Kuster to discuss the pros and cons of ICD therapy for primary prevention of sudden cardiac death.

## 2017-06-22 NOTE — Patient Instructions (Signed)
Medication Instructions: START Metoprolol Tartrate 25 mg--Take 1/4 tablet (6.25 mg) twice daily.   Follow-Up: You have been referred to Dr. Sallyanne Kuster. Schedule appointment in about 2 weeks to discuss ICD Therapy.  We request that you follow-up in: 3 months with an extender and in 6 months with Dr Andria Rhein will receive a reminder letter in the mail two months in advance. If you don't receive a letter, please call our office to schedule the follow-up appointment.  If you need a refill on your cardiac medications before your next appointment, please call your pharmacy.

## 2017-06-27 DIAGNOSIS — M792 Neuralgia and neuritis, unspecified: Secondary | ICD-10-CM | POA: Diagnosis not present

## 2017-06-27 DIAGNOSIS — J209 Acute bronchitis, unspecified: Secondary | ICD-10-CM | POA: Diagnosis not present

## 2017-06-29 ENCOUNTER — Emergency Department (HOSPITAL_COMMUNITY): Payer: Medicare Other

## 2017-06-29 ENCOUNTER — Emergency Department (HOSPITAL_COMMUNITY)
Admission: EM | Admit: 2017-06-29 | Discharge: 2017-06-29 | Disposition: A | Discharge status: Home / Self Care | Payer: Medicare Other | Attending: Emergency Medicine | Admitting: Emergency Medicine

## 2017-06-29 ENCOUNTER — Encounter (HOSPITAL_COMMUNITY): Payer: Self-pay | Admitting: *Deleted

## 2017-06-29 DIAGNOSIS — I110 Hypertensive heart disease with heart failure: Secondary | ICD-10-CM | POA: Diagnosis not present

## 2017-06-29 DIAGNOSIS — I5043 Acute on chronic combined systolic (congestive) and diastolic (congestive) heart failure: Secondary | ICD-10-CM | POA: Diagnosis not present

## 2017-06-29 DIAGNOSIS — F17200 Nicotine dependence, unspecified, uncomplicated: Secondary | ICD-10-CM | POA: Diagnosis not present

## 2017-06-29 DIAGNOSIS — R42 Dizziness and giddiness: Secondary | ICD-10-CM | POA: Diagnosis not present

## 2017-06-29 DIAGNOSIS — Z9861 Coronary angioplasty status: Secondary | ICD-10-CM | POA: Insufficient documentation

## 2017-06-29 DIAGNOSIS — F329 Major depressive disorder, single episode, unspecified: Secondary | ICD-10-CM | POA: Diagnosis not present

## 2017-06-29 DIAGNOSIS — R404 Transient alteration of awareness: Secondary | ICD-10-CM | POA: Diagnosis not present

## 2017-06-29 DIAGNOSIS — W19XXXA Unspecified fall, initial encounter: Secondary | ICD-10-CM

## 2017-06-29 DIAGNOSIS — S0990XA Unspecified injury of head, initial encounter: Secondary | ICD-10-CM | POA: Diagnosis not present

## 2017-06-29 DIAGNOSIS — J449 Chronic obstructive pulmonary disease, unspecified: Secondary | ICD-10-CM | POA: Insufficient documentation

## 2017-06-29 DIAGNOSIS — Z7902 Long term (current) use of antithrombotics/antiplatelets: Secondary | ICD-10-CM | POA: Diagnosis not present

## 2017-06-29 DIAGNOSIS — I2510 Atherosclerotic heart disease of native coronary artery without angina pectoris: Secondary | ICD-10-CM | POA: Insufficient documentation

## 2017-06-29 DIAGNOSIS — R51 Headache: Secondary | ICD-10-CM | POA: Diagnosis not present

## 2017-06-29 DIAGNOSIS — R9431 Abnormal electrocardiogram [ECG] [EKG]: Secondary | ICD-10-CM | POA: Diagnosis not present

## 2017-06-29 DIAGNOSIS — R531 Weakness: Secondary | ICD-10-CM | POA: Diagnosis not present

## 2017-06-29 DIAGNOSIS — W010XXA Fall on same level from slipping, tripping and stumbling without subsequent striking against object, initial encounter: Secondary | ICD-10-CM | POA: Insufficient documentation

## 2017-06-29 DIAGNOSIS — Z9049 Acquired absence of other specified parts of digestive tract: Secondary | ICD-10-CM | POA: Insufficient documentation

## 2017-06-29 DIAGNOSIS — E119 Type 2 diabetes mellitus without complications: Secondary | ICD-10-CM | POA: Diagnosis not present

## 2017-06-29 DIAGNOSIS — S199XXA Unspecified injury of neck, initial encounter: Secondary | ICD-10-CM | POA: Diagnosis not present

## 2017-06-29 DIAGNOSIS — Z79899 Other long term (current) drug therapy: Secondary | ICD-10-CM | POA: Insufficient documentation

## 2017-06-29 DIAGNOSIS — S0083XA Contusion of other part of head, initial encounter: Secondary | ICD-10-CM | POA: Diagnosis not present

## 2017-06-29 DIAGNOSIS — Z043 Encounter for examination and observation following other accident: Secondary | ICD-10-CM | POA: Diagnosis not present

## 2017-06-29 LAB — CBC WITH DIFFERENTIAL/PLATELET
BASOS PCT: 0 %
Basophils Absolute: 0 10*3/uL (ref 0.0–0.1)
EOS PCT: 1 %
Eosinophils Absolute: 0.1 10*3/uL (ref 0.0–0.7)
HEMATOCRIT: 39.9 % (ref 36.0–46.0)
HEMOGLOBIN: 12.3 g/dL (ref 12.0–15.0)
LYMPHS ABS: 1.3 10*3/uL (ref 0.7–4.0)
Lymphocytes Relative: 13 %
MCH: 26.9 pg (ref 26.0–34.0)
MCHC: 30.8 g/dL (ref 30.0–36.0)
MCV: 87.3 fL (ref 78.0–100.0)
MONO ABS: 0.6 10*3/uL (ref 0.1–1.0)
Monocytes Relative: 6 %
Neutro Abs: 7.9 10*3/uL — ABNORMAL HIGH (ref 1.7–7.7)
Neutrophils Relative %: 80 %
Platelets: 266 10*3/uL (ref 150–400)
RBC: 4.57 MIL/uL (ref 3.87–5.11)
RDW: 14.5 % (ref 11.5–15.5)
WBC: 9.9 10*3/uL (ref 4.0–10.5)

## 2017-06-29 LAB — COMPREHENSIVE METABOLIC PANEL
ALBUMIN: 3.9 g/dL (ref 3.5–5.0)
ALK PHOS: 94 U/L (ref 38–126)
ALT: 20 U/L (ref 14–54)
AST: 26 U/L (ref 15–41)
Anion gap: 12 (ref 5–15)
BILIRUBIN TOTAL: 0.7 mg/dL (ref 0.3–1.2)
BUN: 14 mg/dL (ref 6–20)
CALCIUM: 9.3 mg/dL (ref 8.9–10.3)
CO2: 24 mmol/L (ref 22–32)
CREATININE: 1.01 mg/dL — AB (ref 0.44–1.00)
Chloride: 106 mmol/L (ref 101–111)
GFR calc Af Amer: >60 mL/min (ref >60)
GFR calc non Af Amer: 54 mL/min — ABNORMAL LOW (ref >60)
GLUCOSE: 96 mg/dL (ref 65–99)
POTASSIUM: 3.7 mmol/L (ref 3.5–5.1)
Sodium: 142 mmol/L (ref 135–145)
TOTAL PROTEIN: 6.5 g/dL (ref 6.5–8.1)

## 2017-06-29 LAB — RAPID URINE DRUG SCREEN, HOSP PERFORMED
Amphetamines: NOT DETECTED
BARBITURATES: NOT DETECTED
Benzodiazepines: NOT DETECTED
COCAINE: NOT DETECTED
Opiates: NOT DETECTED
TETRAHYDROCANNABINOL: NOT DETECTED

## 2017-06-29 LAB — URINALYSIS, ROUTINE W REFLEX MICROSCOPIC
BILIRUBIN URINE: NEGATIVE
Glucose, UA: NEGATIVE mg/dL
Hgb urine dipstick: NEGATIVE
Ketones, ur: NEGATIVE mg/dL
Leukocytes, UA: NEGATIVE
NITRITE: NEGATIVE
PH: 8 (ref 5.0–8.0)
Protein, ur: NEGATIVE mg/dL
SPECIFIC GRAVITY, URINE: 1.008 (ref 1.005–1.030)

## 2017-06-29 LAB — I-STAT TROPONIN, ED: Troponin i, poc: 0.01 ng/mL (ref 0.00–0.08)

## 2017-06-29 NOTE — ED Notes (Addendum)
Pt's son came to the desk asking if pt can be given narcan.  Explained to him that since pt is on blood thinner and hit her head when she fell, the priority is to make sure there is no brain bleed.  He states "oh, she's had that before.  But can narcan reverse xanax?" Explained to him that once bleeding is ruled out, the EDP can move forward.  Ensured him that this nurse will relay his request to the EDP.  He also did not want any information discussed re the pt in front of the neighbor whom he does not know.

## 2017-06-29 NOTE — ED Notes (Signed)
Pt resting with eyes closed at this time.

## 2017-06-29 NOTE — ED Notes (Signed)
Lab reports given to pt's son per pt's request

## 2017-06-29 NOTE — ED Notes (Signed)
Pt reports she fell last night multiple times hitting her head on the wooden floor.  Pt is lethargic but responds to verbal stimuli.  Pt's friend at bedside reports pt had a syncopal episodes x 2 this am when she went to check on her.  Pt's Son who is the pt's POA reports same thing happened when pt took lyrica in the past.  He reports that pt "loves to take pain medicines."  States "she used to eat my dad's morphine when he had cancer."  He reports the last time she was in the hospital, pt was c/o cold sxs, was advised to see her doctor, but instead she "took all of her prescription pain medicines, went to [place] and gambled, went home took more of her pain medicines and slept for 3 days.  I'm sure she developed pneumonia and that's why she had CHF."  Pt was in the hospital in June and was intubated.

## 2017-06-29 NOTE — ED Notes (Signed)
Spoke with pt's son  St's he is on his way to pick up pt with her clothes

## 2017-06-29 NOTE — ED Triage Notes (Signed)
Per EMS, pt from [facility] independent living.  Pt is A&O.x4.  Reports taking lyrica for the first time yesterday, felt dizzy last night and fell.  Hit her head, hematoma on the left side of her forehead per EMS.  Pt is on blood thinner.
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
## 2017-06-29 NOTE — ED Notes (Signed)
CM was at bedside

## 2017-06-29 NOTE — Discharge Instructions (Signed)
You will receive a call and or visit from home health care nursing and therapist.  This is to ensure that you are doing well at home and that you do not have any additional needs to help you be safe and decrease the chance of you falling again
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
## 2017-06-29 NOTE — ED Notes (Signed)
Waiting for pt's son to return with pt's clothes so she can be discharged

## 2017-06-29 NOTE — ED Notes (Signed)
Pt ambulated to the BR without difficulty, and steady gait.  This nurse held on to her hand.  [place] notified.  Apple juice and graham crackers given to pt.

## 2017-06-29 NOTE — ED Notes (Signed)
Pt's belongings with her black purse is with pt's son Dolores Ewing [facility]) [phone]

## 2017-06-30 ENCOUNTER — Ambulatory Visit: Payer: Medicare Other | Admitting: Cardiovascular Disease

## 2017-07-04 ENCOUNTER — Encounter: Payer: Self-pay | Admitting: Cardiovascular Disease

## 2017-07-04 ENCOUNTER — Ambulatory Visit (INDEPENDENT_AMBULATORY_CARE_PROVIDER_SITE_OTHER): Payer: Medicare Other | Admitting: Cardiovascular Disease

## 2017-07-04 VITALS — BP 146/78 | HR 78 | Ht 60.0 in | Wt 114.0 lb

## 2017-07-04 DIAGNOSIS — Z01812 Encounter for preprocedural laboratory examination: Secondary | ICD-10-CM | POA: Diagnosis not present

## 2017-07-04 DIAGNOSIS — I5042 Chronic combined systolic (congestive) and diastolic (congestive) heart failure: Secondary | ICD-10-CM | POA: Diagnosis not present

## 2017-07-04 DIAGNOSIS — Z9189 Other specified personal risk factors, not elsewhere classified: Secondary | ICD-10-CM | POA: Diagnosis not present

## 2017-07-04 DIAGNOSIS — I480 Paroxysmal atrial fibrillation: Secondary | ICD-10-CM

## 2017-07-04 DIAGNOSIS — I25119 Atherosclerotic heart disease of native coronary artery with unspecified angina pectoris: Secondary | ICD-10-CM

## 2017-07-04 DIAGNOSIS — I420 Dilated cardiomyopathy: Secondary | ICD-10-CM

## 2017-07-04 DIAGNOSIS — I209 Angina pectoris, unspecified: Secondary | ICD-10-CM

## 2017-07-04 DIAGNOSIS — I255 Ischemic cardiomyopathy: Secondary | ICD-10-CM

## 2017-07-04 DIAGNOSIS — I951 Orthostatic hypotension: Secondary | ICD-10-CM

## 2017-07-04 DIAGNOSIS — J449 Chronic obstructive pulmonary disease, unspecified: Secondary | ICD-10-CM | POA: Diagnosis not present
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
## 2017-07-04 NOTE — Patient Instructions (Addendum)
Dr Sallyanne Kuster has recommended making the following medication changes: 1. STOP Verapamil  Cardioverter Defibrillator Implantation An implantable cardioverter defibrillator (ICD) is a small device that is placed under the skin in the chest or abdomen. An ICD consists of a battery, a small computer (pulse generator), and wires (leads) that go into the heart. An ICD is used to detect and correct two types of dangerous irregular heartbeats (arrhythmias):  A rapid heart rhythm (tachycardia).  An arrhythmia in which the lower chambers of the heart (ventricles) contract in an uncoordinated way (fibrillation).  When an ICD detects tachycardia, it sends a low-energy shock to the heart to restore the heartbeat to normal (cardioversion). This signal is usually painless. If cardioversion does not work or if the ICD detects fibrillation, it delivers a high-energy shock to the heart (defibrillation) to restart the heart. This shock may feel like a strong jolt in the chest. Your health care provider may prescribe an ICD if:  You have had an arrhythmia that originated in the ventricles.  Your heart has been damaged by a disease or heart condition.  Sometimes, ICDs are programmed to act as a device called a pacemaker. Pacemakers can be used to treat a slow heartbeat (bradycardia) or tachycardia by taking over the heart rate with electrical impulses. Tell a health care provider about:  Any allergies you have.  All medicines you are taking, including vitamins, herbs, eye drops, creams, and over-the-counter medicines.  Any problems you or family members have had with anesthetic medicines.  Any blood disorders you have.  Any surgeries you have had.  Any medical conditions you have.  Whether you are pregnant or may be pregnant. What are the risks? Generally, this is a safe procedure. However, problems may occur, including:  Swelling, bleeding, or bruising.  Infection.  Blood clots.  Damage to  other structures or organs, such as nerves, blood vessels, or the heart.  Allergic reactions to medicines used during the procedure.  What happens before the procedure? Staying hydrated Follow instructions from your health care provider about hydration, which may include:  Up to 2 hours before the procedure - you may continue to drink clear liquids, such as water, clear fruit juice, black coffee, and plain tea.  Eating and drinking restrictions Follow instructions from your health care provider about eating and drinking, which may include:  8 hours before the procedure - stop eating heavy meals or foods such as meat, fried foods, or fatty foods.  6 hours before the procedure - stop eating light meals or foods, such as toast or cereal.  6 hours before the procedure - stop drinking milk or drinks that contain milk.  2 hours before the procedure - stop drinking clear liquids.  Medicine Ask your health care provider about:  Changing or stopping your normal medicines. This is important if you take diabetes medicines or blood thinners.  Taking medicines such as aspirin and ibuprofen. These medicines can thin your blood. Do not take these medicines before your procedure if your doctor tells you not to.  Tests  You may have blood tests.  You may have a test to check the electrical signals in your heart (electrocardiogram, ECG).  You may have imaging tests, such as a chest X-ray. General instructions  For 24 hours before the procedure, stop using products that contain nicotine or tobacco, such as cigarettes and e-cigarettes. If you need help quitting, ask your health care provider.  Plan to have someone take you home from the hospital  or clinic.  You may be asked to shower with a germ-killing soap. What happens during the procedure?  To reduce your risk of infection: ? Your health care team will wash or sanitize their hands. ? Your skin will be washed with soap. ? Hair may be  removed from the surgical area.  Small monitors will be put on your body. They will be used to check your heart, blood pressure, and oxygen level.  An IV tube will be inserted into one of your veins.  You will be given one or more of the following: ? A medicine to help you relax (sedative). ? A medicine to numb the area (local anesthetic). ? A medicine to make you fall asleep (general anesthetic).  Leads will be guided through a blood vessel into your heart and attached to your heart muscles. Depending on the ICD, the leads may go into one ventricle or they may go into both ventricles and into an upper chamber of the heart. An X-ray machine (fluoroscope) will be usedto help guide the leads.  A small incision will be made to create a deep pocket under your skin.  The pulse generator will be placed into the pocket.  The ICD will be tested.  The incision will be closed with stitches (sutures), skin glue, or staples.  A bandage (dressing) will be placed over the incision. This procedure may vary among health care providers and hospitals. What happens after the procedure?  Your blood pressure, heart rate, breathing rate, and blood oxygen level will be monitored often until the medicines you were given have worn off.  A chest X-ray will be taken to check that the ICD is in the right place.  You will need to stay in the hospital for 1-2 days so your health care provider can make sure your ICD is working.  Do not drive for 24 hours if you received a sedative. Ask your health care provider when it is safe for you to drive.  You may be given an identification card explaining that you have an ICD. Summary  An implantable cardioverter defibrillator (ICD) is a small device that is placed under the skin in the chest or abdomen. It is used to detect and correct dangerous irregular heartbeats (arrhythmias).  An ICD consists of a battery, a small computer (pulse generator), and wires (leads)  that go into the heart.  When an ICD detects rapid heart rhythm (tachycardia), it sends a low-energy shock to the heart to restore the heartbeat to normal (cardioversion). If cardioversion does not work or if the ICD detects uncoordinated heart contractions (fibrillation), it delivers a high-energy shock to the heart (defibrillation) to restart the heart.  You will need to stay in the hospital for 1-2 days to make sure your ICD is working. This information is not intended to replace advice given to you by your health care provider. Make sure you discuss any questions you have with your health care provider. Document Released: 05/08/2002 Document Revised: 08/25/2016 Document Reviewed: 08/25/2016 Elsevier Interactive Patient Education  2017 [employer].

## 2017-07-06 ENCOUNTER — Encounter: Payer: Self-pay | Admitting: Cardiovascular Disease

## 2017-07-06 DIAGNOSIS — S0003XA Contusion of scalp, initial encounter: Secondary | ICD-10-CM | POA: Diagnosis not present

## 2017-07-06 DIAGNOSIS — R52 Pain, unspecified: Secondary | ICD-10-CM | POA: Diagnosis not present

## 2017-07-10 ENCOUNTER — Other Ambulatory Visit: Payer: Self-pay

## 2017-07-10 ENCOUNTER — Encounter (HOSPITAL_BASED_OUTPATIENT_CLINIC_OR_DEPARTMENT_OTHER): Payer: Self-pay | Admitting: Emergency Medicine

## 2017-07-10 ENCOUNTER — Emergency Department (HOSPITAL_BASED_OUTPATIENT_CLINIC_OR_DEPARTMENT_OTHER): Payer: Medicare Other

## 2017-07-10 ENCOUNTER — Emergency Department (HOSPITAL_BASED_OUTPATIENT_CLINIC_OR_DEPARTMENT_OTHER)
Admission: EM | Admit: 2017-07-10 | Discharge: 2017-07-10 | Disposition: A | Discharge status: Home / Self Care | Payer: Medicare Other | Attending: Emergency Medicine | Admitting: Emergency Medicine

## 2017-07-10 DIAGNOSIS — W1830XA Fall on same level, unspecified, initial encounter: Secondary | ICD-10-CM | POA: Diagnosis not present

## 2017-07-10 DIAGNOSIS — Y999 Unspecified external cause status: Secondary | ICD-10-CM | POA: Diagnosis not present

## 2017-07-10 DIAGNOSIS — E119 Type 2 diabetes mellitus without complications: Secondary | ICD-10-CM | POA: Diagnosis not present

## 2017-07-10 DIAGNOSIS — S060X0A Concussion without loss of consciousness, initial encounter: Secondary | ICD-10-CM | POA: Diagnosis not present

## 2017-07-10 DIAGNOSIS — I110 Hypertensive heart disease with heart failure: Secondary | ICD-10-CM | POA: Diagnosis not present

## 2017-07-10 DIAGNOSIS — Y939 Activity, unspecified: Secondary | ICD-10-CM | POA: Diagnosis not present

## 2017-07-10 DIAGNOSIS — I2510 Atherosclerotic heart disease of native coronary artery without angina pectoris: Secondary | ICD-10-CM | POA: Insufficient documentation

## 2017-07-10 DIAGNOSIS — Z79899 Other long term (current) drug therapy: Secondary | ICD-10-CM | POA: Insufficient documentation

## 2017-07-10 DIAGNOSIS — I5042 Chronic combined systolic (congestive) and diastolic (congestive) heart failure: Secondary | ICD-10-CM | POA: Insufficient documentation

## 2017-07-10 DIAGNOSIS — S098XXA Other specified injuries of head, initial encounter: Secondary | ICD-10-CM | POA: Diagnosis present

## 2017-07-10 DIAGNOSIS — Z7902 Long term (current) use of antithrombotics/antiplatelets: Secondary | ICD-10-CM | POA: Insufficient documentation

## 2017-07-10 DIAGNOSIS — Y929 Unspecified place or not applicable: Secondary | ICD-10-CM | POA: Insufficient documentation

## 2017-07-10 DIAGNOSIS — J449 Chronic obstructive pulmonary disease, unspecified: Secondary | ICD-10-CM | POA: Diagnosis not present

## 2017-07-10 DIAGNOSIS — F17200 Nicotine dependence, unspecified, uncomplicated: Secondary | ICD-10-CM | POA: Diagnosis not present

## 2017-07-10 DIAGNOSIS — S0003XA Contusion of scalp, initial encounter: Secondary | ICD-10-CM | POA: Diagnosis not present
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
## 2017-07-10 NOTE — Discharge Instructions (Signed)
Please read and follow all provided instructions.  Your diagnoses today include:  1. Concussion without loss of consciousness, initial encounter     Tests performed today include:  CT scan of your head that did not show any serious injury.  Vital signs. See below for your results today.   Medications prescribed:   None  Take any prescribed medications only as directed.  Home care instructions:  Follow any educational materials contained in this packet.  BE VERY CAREFUL not to take multiple medicines containing Tylenol (also called acetaminophen). Doing so can lead to an overdose which can damage your liver and cause liver failure and possibly death.   Follow-up instructions: Please follow-up with your neurologist as directed for further evaluation of your symptoms.   Return instructions:  SEEK IMMEDIATE MEDICAL ATTENTION IF:  There is confusion or drowsiness (although children frequently become drowsy after injury).   You cannot awaken the injured person.   You have more than one episode of vomiting.   You notice dizziness or unsteadiness which is getting worse, or inability to walk.   You have convulsions or unconsciousness.   You experience severe, persistent headaches not relieved by Tylenol.  You cannot use arms or legs normally.   There are changes in pupil sizes. (This is the black center in the colored part of the eye)   There is clear or bloody discharge from the nose or ears.   You have change in speech, vision, swallowing, or understanding.   Localized weakness, numbness, tingling, or change in bowel or bladder control.  You have any other emergent concerns.  Additional Information: You have had a head injury which does not appear to require admission at this time.  Your vital signs today were: BP (!) 150/86 (BP Location: Left Arm)    Pulse 87    Temp 98.2 F (36.8 C) (Oral)    Resp 20    Ht 5' (1.524 m)    Wt 51.7 kg (114 lb)    SpO2 98%    BMI  22.26 kg/m  If your blood pressure (BP) was elevated above 135/85 this visit, please have this repeated by your doctor within one month. --------------

## 2017-07-10 NOTE — ED Triage Notes (Signed)
Pt fell and was seen at [facility] 10 days ago. Has extensive bruising to her face. Had neg head CT at that time. Reports HA is getting worse. States she has intermittent dizziness and trouble concentrating.

## 2017-07-11 ENCOUNTER — Telehealth: Payer: Self-pay | Admitting: Neurology

## 2017-07-11 NOTE — Telephone Encounter (Addendum)
Pt called she passed out Wednesday 06/29/17 hitting her head on the floor. Everytime she would try to get up she would fall and hit her head. She was taken to ED because she could not use her arms or legs and it was thought she had stroke but she did not.  She was getting better but about last Thursday she noticed she was sleeping more, HA's increased. She was seen at ED yesterday, dx with concussion and advised to make appt. Rn was skyped, she advised pt could be seen on 11/21 so an appt was scheduled for 11/21 @ 10:45  FYI

## 2017-07-11 NOTE — Telephone Encounter (Signed)
Dr. Jaynee Eagles made aware that pt fell and was dx with concussion; pt scheduled to see Dr. Jaynee Eagles on 11/21 @ 10:45 AM.

## 2017-07-12 ENCOUNTER — Encounter: Payer: Self-pay | Admitting: Neurology

## 2017-07-12 ENCOUNTER — Ambulatory Visit (INDEPENDENT_AMBULATORY_CARE_PROVIDER_SITE_OTHER): Payer: Medicare Other | Admitting: Neurology

## 2017-07-12 VITALS — BP 131/74 | HR 69 | Ht 60.5 in | Wt 118.2 lb

## 2017-07-12 DIAGNOSIS — F0781 Postconcussional syndrome: Secondary | ICD-10-CM

## 2017-07-12 DIAGNOSIS — I255 Ischemic cardiomyopathy: Secondary | ICD-10-CM
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
## 2017-07-12 NOTE — Patient Instructions (Signed)
Post-Concussion Syndrome Post-concussion syndrome describes the symptoms that can occur after a head injury. These symptoms can last from weeks to months. What are the causes? It is not clear why some head injuries cause post-concussion syndrome. It can occur whether your head injury was mild or severe and whether you were wearing head protection or not. What are the signs or symptoms?  Memory difficulties.  Dizziness.  Headaches.  Double vision or blurry vision.  Sensitivity to light.  Hearing difficulties.  Depression.  Tiredness.  Weakness.  Difficulty with concentration.  Difficulty sleeping or staying asleep.  Vomiting.  Poor balance or instability on your feet.  Slow reaction time.  Difficulty learning and remembering things you have heard. How is this diagnosed? There is no test to determine whether you have post-concussion syndrome. Your health care provider may order an imaging scan of your brain, such as a CT scan, to check for other problems that may be causing your symptoms (such as a severe injury inside your skull). How is this treated? Usually, these problems disappear over time without medical care. Your health care provider may prescribe medicine to help ease your symptoms. It is important to follow up with a neurologist to evaluate your recovery and address any lingering symptoms or issues. Follow these instructions at home:  Take medicines only as directed by your health care provider. Do not take aspirin. Aspirin can slow blood clotting.  Sleep with your head slightly elevated to help with headaches.  Avoid any situation where there is potential for another head injury. This includes football, hockey, soccer, basketball, martial arts, downhill snow sports, and horseback riding. Your condition will get worse every time you experience a concussion. You should avoid these activities until you are evaluated by the appropriate follow-up health care  providers.  Keep all follow-up visits as directed by your health care provider. This is important. Contact a health care provider if:  You have increased problems paying attention or concentrating.  You have increased difficulty remembering or learning new information.  You need more time to complete tasks or assignments than before.  You have increased irritability or decreased ability to cope with stress.  You have more symptoms than before. Seek medical care if you have any of the following symptoms for more than two weeks after your injury:  Lasting (chronic) headaches.  Dizziness or balance problems.  Nausea.  Vision problems.  Increased sensitivity to noise or light.  Depression or mood swings.  Anxiety or irritability.  Memory problems.  Difficulty concentrating or paying attention.  Sleep problems.  Feeling tired all the time.  Get help right away if:  You have confusion or unusual drowsiness.  Others find it difficult to wake you up.  You have nausea or persistent, forceful vomiting.  You feel like you are moving when you are not (vertigo). Your eyes may move rapidly back and forth.  You have convulsions or faint.  You have severe, persistent headaches that are not relieved by medicine.  You cannot use your arms or legs normally.  One of your pupils is larger than the other.  You have clear or bloody discharge from your nose or ears.  Your problems are getting worse, not better. This information is not intended to replace advice given to you by your health care provider. Make sure you discuss any questions you have with your health care provider. Document Released: 02/05/2002 Document Revised: 03/05/2016 Document Reviewed: 11/21/2013 Elsevier Interactive Patient Education  2018 [employer].

## 2017-07-18 DIAGNOSIS — F0781 Postconcussional syndrome: Secondary | ICD-10-CM | POA: Insufficient documentation

## 2017-07-20 ENCOUNTER — Ambulatory Visit: Payer: Self-pay | Admitting: Neurology

## 2017-07-27 ENCOUNTER — Inpatient Hospital Stay (HOSPITAL_COMMUNITY)
Admission: RE | Admit: 2017-07-27 | Discharge: 2017-07-28 | DRG: 245 | Disposition: A | Discharge status: Home / Self Care | Payer: Medicare Other | Source: Ambulatory Visit | Attending: Cardiovascular Disease | Admitting: Cardiovascular Disease

## 2017-07-27 ENCOUNTER — Encounter (HOSPITAL_COMMUNITY)
Admission: RE | Disposition: A | Discharge status: Home / Self Care | Payer: Self-pay | Source: Ambulatory Visit | Attending: Cardiovascular Disease

## 2017-07-27 DIAGNOSIS — Z886 Allergy status to analgesic agent status: Secondary | ICD-10-CM

## 2017-07-27 DIAGNOSIS — I2510 Atherosclerotic heart disease of native coronary artery without angina pectoris: Secondary | ICD-10-CM | POA: Diagnosis present

## 2017-07-27 DIAGNOSIS — D649 Anemia, unspecified: Secondary | ICD-10-CM

## 2017-07-27 DIAGNOSIS — J449 Chronic obstructive pulmonary disease, unspecified: Secondary | ICD-10-CM | POA: Diagnosis present

## 2017-07-27 DIAGNOSIS — I428 Other cardiomyopathies: Secondary | ICD-10-CM

## 2017-07-27 DIAGNOSIS — E785 Hyperlipidemia, unspecified: Secondary | ICD-10-CM | POA: Diagnosis present

## 2017-07-27 DIAGNOSIS — E1140 Type 2 diabetes mellitus with diabetic neuropathy, unspecified: Secondary | ICD-10-CM | POA: Diagnosis present

## 2017-07-27 DIAGNOSIS — I252 Old myocardial infarction: Secondary | ICD-10-CM

## 2017-07-27 DIAGNOSIS — I480 Paroxysmal atrial fibrillation: Secondary | ICD-10-CM | POA: Diagnosis present

## 2017-07-27 DIAGNOSIS — Z955 Presence of coronary angioplasty implant and graft: Secondary | ICD-10-CM

## 2017-07-27 DIAGNOSIS — I5042 Chronic combined systolic (congestive) and diastolic (congestive) heart failure: Secondary | ICD-10-CM | POA: Diagnosis present

## 2017-07-27 DIAGNOSIS — Z79899 Other long term (current) drug therapy: Secondary | ICD-10-CM | POA: Diagnosis not present

## 2017-07-27 DIAGNOSIS — I10 Essential (primary) hypertension: Secondary | ICD-10-CM | POA: Diagnosis present

## 2017-07-27 DIAGNOSIS — Z006 Encounter for examination for normal comparison and control in clinical research program: Secondary | ICD-10-CM | POA: Diagnosis not present

## 2017-07-27 DIAGNOSIS — Z95810 Presence of automatic (implantable) cardiac defibrillator: Secondary | ICD-10-CM | POA: Diagnosis present

## 2017-07-27 DIAGNOSIS — E1151 Type 2 diabetes mellitus with diabetic peripheral angiopathy without gangrene: Secondary | ICD-10-CM | POA: Diagnosis present

## 2017-07-27 DIAGNOSIS — I951 Orthostatic hypotension: Secondary | ICD-10-CM | POA: Diagnosis present

## 2017-07-27 DIAGNOSIS — Z9189 Other specified personal risk factors, not elsewhere classified: Secondary | ICD-10-CM

## 2017-07-27 DIAGNOSIS — I255 Ischemic cardiomyopathy: Principal | ICD-10-CM

## 2017-07-27 DIAGNOSIS — I110 Hypertensive heart disease with heart failure: Secondary | ICD-10-CM | POA: Diagnosis present

## 2017-07-27 DIAGNOSIS — I420 Dilated cardiomyopathy: Secondary | ICD-10-CM | POA: Diagnosis present

## 2017-07-27 DIAGNOSIS — Z885 Allergy status to narcotic agent status: Secondary | ICD-10-CM | POA: Diagnosis not present

## 2017-07-27 DIAGNOSIS — Z7952 Long term (current) use of systemic steroids: Secondary | ICD-10-CM

## 2017-07-27 DIAGNOSIS — I517 Cardiomegaly: Secondary | ICD-10-CM | POA: Diagnosis not present

## 2017-07-27 DIAGNOSIS — Z8249 Family history of ischemic heart disease and other diseases of the circulatory system: Secondary | ICD-10-CM

## 2017-07-27 DIAGNOSIS — Z7902 Long term (current) use of antithrombotics/antiplatelets: Secondary | ICD-10-CM | POA: Diagnosis not present

## 2017-07-27 HISTORY — DX: Presence of automatic (implantable) cardiac defibrillator: Z95.810

## 2017-07-27 HISTORY — DX: Chronic cough: R05.3

## 2017-07-27 HISTORY — DX: Other cardiomyopathies: I42.8

## 2017-07-27 HISTORY — DX: Anemia, unspecified: D64.9

## 2017-07-27 HISTORY — DX: Cough: R05

## 2017-07-27 HISTORY — DX: Angina pectoris, unspecified: I20.9

## 2017-07-27 HISTORY — DX: Chronic combined systolic (congestive) and diastolic (congestive) heart failure: I50.42

## 2017-07-27 HISTORY — DX: Personal history of urinary calculi: Z87.442

## 2017-07-27 HISTORY — DX: Type 2 diabetes mellitus without complications: E11.9

## 2017-07-27 HISTORY — DX: Dyspnea, unspecified: R06.00

## 2017-07-27 HISTORY — DX: Peptic ulcer, site unspecified, unspecified as acute or chronic, without hemorrhage or perforation: K27.9

## 2017-07-27 HISTORY — DX: Gastrointestinal hemorrhage, unspecified: K92.2

## 2017-07-27 HISTORY — PX: ICD IMPLANT: EP1208

## 2017-07-27 HISTORY — DX: Paroxysmal atrial fibrillation: I48.0

## 2017-07-27 LAB — CBC
HEMATOCRIT: 33 % — AB (ref 36.0–46.0)
HEMOGLOBIN: 10.1 g/dL — AB (ref 12.0–15.0)
MCH: 26.4 pg (ref 26.0–34.0)
MCHC: 30.6 g/dL (ref 30.0–36.0)
MCV: 86.2 fL (ref 78.0–100.0)
Platelets: 393 10*3/uL (ref 150–400)
RBC: 3.83 MIL/uL — ABNORMAL LOW (ref 3.87–5.11)
RDW: 14.2 % (ref 11.5–15.5)
WBC: 8.5 10*3/uL (ref 4.0–10.5)

## 2017-07-27 LAB — BASIC METABOLIC PANEL
ANION GAP: 5 (ref 5–15)
BUN: 11 mg/dL (ref 6–20)
CALCIUM: 8.9 mg/dL (ref 8.9–10.3)
CO2: 27 mmol/L (ref 22–32)
Chloride: 107 mmol/L (ref 101–111)
Creatinine, Ser: 1 mg/dL (ref 0.44–1.00)
GFR calc Af Amer: >60 mL/min (ref >60)
GFR, EST NON AFRICAN AMERICAN: 55 mL/min — AB (ref >60)
Glucose, Bld: 125 mg/dL — ABNORMAL HIGH (ref 65–99)
POTASSIUM: 3.9 mmol/L (ref 3.5–5.1)
SODIUM: 139 mmol/L (ref 135–145)

## 2017-07-27 LAB — PROTIME-INR
INR: 0.88
PROTHROMBIN TIME: 11.8 s (ref 11.4–15.2)

## 2017-07-27 LAB — GLUCOSE, CAPILLARY
GLUCOSE-CAPILLARY: 132 mg/dL — AB (ref 65–99)
GLUCOSE-CAPILLARY: 172 mg/dL — AB (ref 65–99)

## 2017-07-27 LAB — SURGICAL PCR SCREEN
MRSA, PCR: NEGATIVE
STAPHYLOCOCCUS AUREUS: NEGATIVE

## 2017-07-27 SURGERY — ICD IMPLANT

## 2017-07-27 MED ORDER — CYCLOBENZAPRINE HCL 10 MG PO TABS
5.0000 mg | ORAL_TABLET | Freq: Three times a day (TID) | ORAL | Status: DC | PRN
  Administered 2017-07-27: 5 mg via ORAL
  Filled 2017-07-27 (×2): qty 1

## 2017-07-27 MED ORDER — MIDAZOLAM HCL 5 MG/5ML IJ SOLN
INTRAMUSCULAR | Status: DC | PRN
  Administered 2017-07-27: 2 mg via INTRAVENOUS

## 2017-07-27 MED ORDER — IPRATROPIUM-ALBUTEROL 0.5-2.5 (3) MG/3ML IN SOLN: 3.0000 mL | RESPIRATORY_TRACT | Status: DC | PRN

## 2017-07-27 MED ORDER — SODIUM CHLORIDE 0.9 % IV SOLN
INTRAVENOUS | Status: DC
  Administered 2017-07-27: 12:00:00 via INTRAVENOUS

## 2017-07-27 MED ORDER — ONDANSETRON HCL 4 MG/2ML IJ SOLN: 4.0000 mg | Freq: Four times a day (QID) | INTRAMUSCULAR | Status: DC | PRN

## 2017-07-27 MED ORDER — PANTOPRAZOLE SODIUM 40 MG PO TBEC: 40.0000 mg | DELAYED_RELEASE_TABLET | Freq: Every day | ORAL | Status: DC | PRN

## 2017-07-27 MED ORDER — SODIUM CHLORIDE 0.9 % IV SOLN: INTRAVENOUS | Status: DC

## 2017-07-27 MED ORDER — ISOSORBIDE MONONITRATE ER 30 MG PO TB24
30.0000 mg | ORAL_TABLET | Freq: Every day | ORAL | Status: DC
  Administered 2017-07-27 – 2017-07-28 (×2): 30 mg via ORAL
  Filled 2017-07-27 (×2): qty 1

## 2017-07-27 MED ORDER — CEFAZOLIN SODIUM-DEXTROSE 2-4 GM/100ML-% IV SOLN
2.0000 g | INTRAVENOUS | Status: AC
  Administered 2017-07-27: 2 g via INTRAVENOUS

## 2017-07-27 MED ORDER — CEFAZOLIN SODIUM-DEXTROSE 1-4 GM/50ML-% IV SOLN
1.0000 g | Freq: Four times a day (QID) | INTRAVENOUS | Status: AC
  Administered 2017-07-27 – 2017-07-28 (×3): 1 g via INTRAVENOUS
  Filled 2017-07-27 (×3): qty 50

## 2017-07-27 MED ORDER — CEFAZOLIN SODIUM-DEXTROSE 2-4 GM/100ML-% IV SOLN
INTRAVENOUS | Status: AC
  Filled 2017-07-27: qty 100

## 2017-07-27 MED ORDER — CHLORHEXIDINE GLUCONATE 4 % EX LIQD: 60.0000 mL | Freq: Once | CUTANEOUS | Status: DC

## 2017-07-27 MED ORDER — PENTOXIFYLLINE ER 400 MG PO TBCR
400.0000 mg | EXTENDED_RELEASE_TABLET | Freq: Every day | ORAL | Status: DC
  Administered 2017-07-28: 10:00:00 400 mg via ORAL
  Filled 2017-07-27: qty 1

## 2017-07-27 MED ORDER — MUPIROCIN 2 % EX OINT
1.0000 "application " | TOPICAL_OINTMENT | Freq: Once | CUTANEOUS | Status: AC
  Administered 2017-07-27: 1 via TOPICAL

## 2017-07-27 MED ORDER — ATORVASTATIN CALCIUM 10 MG PO TABS
10.0000 mg | ORAL_TABLET | Freq: Every day | ORAL | Status: DC
  Administered 2017-07-27: 10 mg via ORAL
  Filled 2017-07-27: qty 1

## 2017-07-27 MED ORDER — SODIUM CHLORIDE 0.9 % IR SOLN
80.0000 mg | Status: AC
  Administered 2017-07-27: 80 mg

## 2017-07-27 MED ORDER — CYCLOBENZAPRINE HCL 10 MG PO TABS
10.0000 mg | ORAL_TABLET | Freq: Three times a day (TID) | ORAL | Status: DC | PRN
  Administered 2017-07-27 – 2017-07-28 (×2): 10 mg via ORAL
  Filled 2017-07-27 (×2): qty 1

## 2017-07-27 MED ORDER — LIDOCAINE HCL (PF) 1 % IJ SOLN
INTRAMUSCULAR | Status: AC
  Filled 2017-07-27: qty 60

## 2017-07-27 MED ORDER — TRAMADOL HCL 50 MG PO TABS
ORAL_TABLET | ORAL | Status: AC
  Administered 2017-07-27: 50 mg via ORAL
  Filled 2017-07-27: qty 1

## 2017-07-27 MED ORDER — METOPROLOL TARTRATE 25 MG/10 ML ORAL SUSPENSION
6.2500 mg | Freq: Two times a day (BID) | ORAL | Status: DC
  Administered 2017-07-27 – 2017-07-28 (×2): 6.25 mg via ORAL
  Filled 2017-07-27 (×3): qty 5

## 2017-07-27 MED ORDER — OFF THE BEAT BOOK
Freq: Once | Status: AC
  Administered 2017-07-27: 22:00:00
  Filled 2017-07-27: qty 1

## 2017-07-27 MED ORDER — MAGNESIUM 500 MG PO TABS: 250.0000 mg | ORAL_TABLET | Freq: Two times a day (BID) | ORAL | Status: DC

## 2017-07-27 MED ORDER — CLOPIDOGREL BISULFATE 75 MG PO TABS: 75.0000 mg | ORAL_TABLET | Freq: Every day | ORAL | Status: DC

## 2017-07-27 MED ORDER — TRAMADOL HCL 50 MG PO TABS
50.0000 mg | ORAL_TABLET | Freq: Four times a day (QID) | ORAL | Status: DC | PRN
Start: 2017-07-27 — End: 2017-07-28
  Administered 2017-07-27 – 2017-07-28 (×3): 50 mg via ORAL
  Filled 2017-07-27 (×3): qty 1

## 2017-07-27 MED ORDER — MUPIROCIN 2 % EX OINT
TOPICAL_OINTMENT | CUTANEOUS | Status: AC
  Administered 2017-07-27: 1 via TOPICAL
  Filled 2017-07-27: qty 22

## 2017-07-27 MED ORDER — HEPARIN (PORCINE) IN NACL 2-0.9 UNIT/ML-% IJ SOLN
INTRAMUSCULAR | Status: AC | PRN
  Administered 2017-07-27: 500 mL

## 2017-07-27 MED ORDER — FENTANYL CITRATE (PF) 100 MCG/2ML IJ SOLN
INTRAMUSCULAR | Status: AC
  Filled 2017-07-27: qty 2

## 2017-07-27 MED ORDER — IPRATROPIUM-ALBUTEROL 0.5-2.5 (3) MG/3ML IN SOLN: 3.0000 mL | Freq: Three times a day (TID) | RESPIRATORY_TRACT | Status: DC

## 2017-07-27 MED ORDER — MAGNESIUM OXIDE 400 (241.3 MG) MG PO TABS
400.0000 mg | ORAL_TABLET | Freq: Two times a day (BID) | ORAL | Status: DC
  Administered 2017-07-27 – 2017-07-28 (×2): 400 mg via ORAL
  Filled 2017-07-27 (×2): qty 1

## 2017-07-27 MED ORDER — PREDNISONE 10 MG PO TABS
10.0000 mg | ORAL_TABLET | Freq: Every day | ORAL | Status: DC
  Administered 2017-07-28: 10 mg via ORAL
  Filled 2017-07-27: qty 2
  Filled 2017-07-27: qty 1

## 2017-07-27 MED ORDER — FENTANYL CITRATE (PF) 100 MCG/2ML IJ SOLN
INTRAMUSCULAR | Status: DC | PRN
  Administered 2017-07-27: 50 ug via INTRAVENOUS

## 2017-07-27 MED ORDER — HEPARIN (PORCINE) IN NACL 2-0.9 UNIT/ML-% IJ SOLN
INTRAMUSCULAR | Status: AC
  Filled 2017-07-27: qty 500

## 2017-07-27 MED ORDER — SODIUM CHLORIDE 0.9 % IR SOLN
Status: AC
  Filled 2017-07-27: qty 2

## 2017-07-27 MED ORDER — MIDAZOLAM HCL 5 MG/5ML IJ SOLN
INTRAMUSCULAR | Status: AC
  Filled 2017-07-27: qty 5

## 2017-07-27 MED ORDER — ACETAMINOPHEN 325 MG PO TABS
325.0000 mg | ORAL_TABLET | ORAL | Status: DC | PRN
Start: 2017-07-27 — End: 2017-07-28

## 2017-07-27 MED ORDER — ROPINIROLE HCL 1 MG PO TABS
2.0000 mg | ORAL_TABLET | Freq: Every day | ORAL | Status: DC
  Administered 2017-07-27: 21:00:00 2 mg via ORAL
  Filled 2017-07-27: qty 2

## 2017-07-27 MED ORDER — TRAMADOL HCL 50 MG PO TABS: 50.0000 mg | ORAL_TABLET | Freq: Four times a day (QID) | ORAL | Status: DC | PRN

## 2017-07-27 MED ORDER — LIDOCAINE HCL (PF) 1 % IJ SOLN
INTRAMUSCULAR | Status: DC | PRN
  Administered 2017-07-27: 45 mL

## 2017-07-27 MED ORDER — FUROSEMIDE 20 MG PO TABS
20.0000 mg | ORAL_TABLET | Freq: Every day | ORAL | Status: DC
  Administered 2017-07-28: 20 mg via ORAL
  Filled 2017-07-27: qty 1

## 2017-07-27 SURGICAL SUPPLY — 7 items
CABLE SURGICAL S-101-97-12 (CABLE) ×2 IMPLANT
ICD VISIA MRI VR DVFB1D4 (ICD Generator) ×1 IMPLANT
LEAD SPRINT QUAT SEC 6935M-55 (Lead) ×2 IMPLANT
PAD DEFIB LIFELINK (PAD) ×2 IMPLANT
SHEATH CLASSIC 9F (SHEATH) ×2 IMPLANT
TRAY PACEMAKER INSERTION (PACKS) ×2 IMPLANT
VISIA MRI VR DVFB1D4 (ICD Generator) ×2 IMPLANT

## 2017-07-27 NOTE — Op Note (Signed)
Procedure report  Procedure performed: 1. Implantation of new single chamber cardioverter defibrillator 2. Fluoroscopy 3. Moderate sedation 4. Initial lead and generator testing  Reason for procedure: Primary prevention of sudden cardiac death Ischemic cardiomyopathy, left ventricular ejection fraction less than 30%, Heart failure NYHA class 2, on comprehensive medical therapy for over 90 days (MADIT-II)  Procedure performed by: Sanda Klein, MD  Complications: None  Estimated blood loss: <10 mL  Medications administered during procedure: Ancef 2 g intravenously Lidocaine 1% 30 mL locally,  Fentanyl 50 mcg intravenously Versed 2 mg intravenously  Device details:  Generator Medtronic Visia AF MRI model DVFB1D4 serial number [id number] Right ventricular lead Medtronic [id number] serial number [id number]  Procedure details:  After the risks and benefits of the procedure were discussed the patient provided informed consent and was brought to the cardiac cath lab in the fasting state. The patient was prepped and draped in usual sterile fashion. Local anesthesia with 1% lidocaine was administered to to the left infraclavicular area. A 5-6 cm horizontal incision was made parallel with and 2-3 cm caudal to the left clavicle. Using electrocautery and blunt dissection a prepectoral pocket was created down to the level of the pectoralis major muscle fascia. The pocket was carefully inspected for hemostasis. An antibiotic-soaked sponge was placed in the pocket.  During this procedure the patient is administered a total of Versed 2 mg and Fentanyl 50 mcg to achieve and maintain moderate conscious sedation.  The patient's heart rate, blood pressure, and oxygen saturation are monitored continuously during the procedure. The period of conscious sedation is 32 minutes, of which I was present face-to-face 100% of this time.  Under fluoroscopic guidance and using the modified Seldinger technique a  single venipuncture was performed to access the left subclavian vein. No difficulty was encountered accessing the vein. A J-tip guidewires were subsequently exchanged for a 9.5 French safe sheath.  Under fluoroscopic guidance the ventricular lead was advanced to level of the mid to apical right ventricular septum and thet active-fixation helix was deployed. Prominent current of injury was seen. Satisfactory pacing and sensing parameters were recorded. There was no evidence of diaphragmatic stimulation at maximum device output. The safe sheath was peeled away and the lead was secured in place with 2-0 silk.  The antibiotic-soaked sponge was removed from the pocket. The pocket was flushed with copious amounts of antibiotic solution. Reinspection showed excellent hemostasis.  The ventricular lead was connected to the generator and appropriate ventricular pacing was seen.  The entire system was then carefully inserted in the pocket with care been taking that the leads and device assumed a comfortable position without pressure on the incision. Great care was taken that the leads be located deep to the generator. The pocket was then closed in layers using 2 layers of 2-0 Vicryl and cutaneous staples, after which a sterile dressing was applied.  At the end of the procedure the following lead parameters were encountered:  Right ventricular lead sensed R waves 11 mV, impedance 697 ohms, threshold 0.5 V at 0.5 ms pulse width.  High voltage impedance 64 ohm.  Cc:

## 2017-07-27 NOTE — Progress Notes (Signed)
Patient received from EP lab. Dressing assessed with bleeding noted on upper area of dressing. Monitored with slight increased oozing noted with assessments. Notified Ignacia Bayley PA and orders received to apply pressure dressing over occlusive dressing. Gauze dressing secured with elastoplast tape to provide a pressure dressing.

## 2017-07-27 NOTE — Progress Notes (Signed)
ICD Criteria  Current LVEF:25%. Within 12 months prior to implant: Yes   Heart failure history: Yes, Class II  Cardiomyopathy history: Yes, Ischemic Cardiomyopathy - Prior MI.  Atrial Fibrillation/Atrial Flutter: Yes, Paroxysmal.  Ventricular tachycardia history: No.  Cardiac arrest history: No.  History of syndromes with risk of sudden death: No.  Previous ICD: No.  Current ICD indication: Primary  PPM indication: No.   Class I or II Bradycardia indication present: No  Beta Blocker therapy for 3 or more months: Yes, prescribed.   Ace Inhibitor/ARB therapy for 3 or more months: Yes, prescribed.

## 2017-07-27 NOTE — Interval H&P Note (Signed)
History and Physical Interval Note:  07/27/2017 12:15 PM  Kimberly Downs  has presented today for surgery, with the diagnosis of cmp  The various methods of treatment have been discussed with the patient and family. After consideration of risks, benefits and other options for treatment, the patient has consented to  Procedure(s): ICD IMPLANT (N/A) as a surgical intervention .  The patient's history has been reviewed, patient examined, no change in status, stable for surgery.  I have reviewed the patient's chart and labs.  Questions were answered to the patient's satisfaction.    I have seen Kimberly Downs is a 71 y.o. female in the office who has been referred by Dr. Gwenlyn Found for consideration of ICD implant for primary prevention of sudden death.  The patient's chart has been reviewed and they meet criteria for ICD implant.  I have had a thorough discussion with the patient reviewing options.  The patient and their family (if available) have had opportunities to ask questions and have them answered. The patient and I have decided together through a shared decision making process to  ICD at this time.  Risks, benefits, alternatives to ICD implantation were discussed in detail with the patient today. The patient  understands that the risks include but are not limited to bleeding, infection, pneumothorax, perforation, tamponade, vascular damage, renal failure, MI, stroke, death, inappropriate shocks, and lead dislodgement and wishes to proceed.  We will therefore schedule device implantation at the next available time.    Kimberly Downs

## 2017-07-28 ENCOUNTER — Ambulatory Visit (HOSPITAL_COMMUNITY): Payer: Medicare Other

## 2017-07-28 ENCOUNTER — Encounter (HOSPITAL_COMMUNITY): Payer: Self-pay | Admitting: Cardiovascular Disease

## 2017-07-28 ENCOUNTER — Other Ambulatory Visit: Payer: Self-pay | Admitting: Physician Assistant

## 2017-07-28 ENCOUNTER — Other Ambulatory Visit: Payer: Self-pay

## 2017-07-28 ENCOUNTER — Telehealth: Payer: Self-pay | Admitting: Physician Assistant

## 2017-07-28 DIAGNOSIS — I5043 Acute on chronic combined systolic (congestive) and diastolic (congestive) heart failure: Secondary | ICD-10-CM

## 2017-07-28 DIAGNOSIS — I517 Cardiomegaly: Secondary | ICD-10-CM | POA: Diagnosis not present

## 2017-07-28 DIAGNOSIS — I480 Paroxysmal atrial fibrillation: Secondary | ICD-10-CM | POA: Diagnosis not present

## 2017-07-28 DIAGNOSIS — Z006 Encounter for examination for normal comparison and control in clinical research program: Secondary | ICD-10-CM | POA: Diagnosis not present

## 2017-07-28 DIAGNOSIS — E1151 Type 2 diabetes mellitus with diabetic peripheral angiopathy without gangrene: Secondary | ICD-10-CM | POA: Diagnosis not present

## 2017-07-28 DIAGNOSIS — I110 Hypertensive heart disease with heart failure: Secondary | ICD-10-CM | POA: Diagnosis not present

## 2017-07-28 DIAGNOSIS — I5042 Chronic combined systolic (congestive) and diastolic (congestive) heart failure: Secondary | ICD-10-CM | POA: Diagnosis not present

## 2017-07-28 DIAGNOSIS — I255 Ischemic cardiomyopathy: Secondary | ICD-10-CM | POA: Diagnosis not present

## 2017-07-28 DIAGNOSIS — J449 Chronic obstructive pulmonary disease, unspecified: Secondary | ICD-10-CM | POA: Diagnosis not present

## 2017-07-28 DIAGNOSIS — I2510 Atherosclerotic heart disease of native coronary artery without angina pectoris: Secondary | ICD-10-CM

## 2017-07-28 DIAGNOSIS — E785 Hyperlipidemia, unspecified: Secondary | ICD-10-CM | POA: Diagnosis not present

## 2017-07-28 DIAGNOSIS — I420 Dilated cardiomyopathy: Secondary | ICD-10-CM | POA: Diagnosis not present

## 2017-07-28 DIAGNOSIS — D649 Anemia, unspecified: Secondary | ICD-10-CM

## 2017-07-28 DIAGNOSIS — Z955 Presence of coronary angioplasty implant and graft: Secondary | ICD-10-CM | POA: Diagnosis not present

## 2017-07-28 DIAGNOSIS — I951 Orthostatic hypotension: Secondary | ICD-10-CM | POA: Diagnosis not present

## 2017-07-28 LAB — GLUCOSE, CAPILLARY
GLUCOSE-CAPILLARY: 79 mg/dL (ref 65–99)
GLUCOSE-CAPILLARY: 133 mg/dL — AB (ref 65–99)

## 2017-07-28 MED ORDER — IPRATROPIUM-ALBUTEROL 0.5-2.5 (3) MG/3ML IN SOLN: 3.0000 mL | Freq: Three times a day (TID) | RESPIRATORY_TRACT | Status: DC

## 2017-07-28 MED ORDER — GUAIFENESIN-DM 100-10 MG/5ML PO SYRP
5.0000 mL | ORAL_SOLUTION | ORAL | Status: DC | PRN
  Administered 2017-07-28: 5 mL via ORAL
  Filled 2017-07-28: qty 5

## 2017-07-28 MED ORDER — IRBESARTAN 75 MG PO TABS: 75.0000 mg | ORAL_TABLET | Freq: Every day | ORAL | 2 refills | Status: DC

## 2017-07-28 MED ORDER — IRBESARTAN 75 MG PO TABS
75.0000 mg | ORAL_TABLET | Freq: Every day | ORAL | Status: DC
  Administered 2017-07-28: 10:00:00 75 mg via ORAL
  Filled 2017-07-28: qty 1

## 2017-07-28 NOTE — Discharge Instructions (Signed)
Supplemental Discharge Instructions for  Pacemaker/Defibrillator Patients   ACTIVITY  Do not raise your left/right arm above shoulder level or extend it backward beyond shoulder level for 2 weeks. Wear the arm sling as a reminder or as needed for comfort for 2 weeks. No heavy lifting or vigorous activity with your left/right arm for 6-8 weeks.   NO DRIVING is preferable for 2 weeks; If absolutely necessary, drive only short, familiar routes. DO wear your seatbelt, even if it crosses over the pacemaker site.   WOUND CARE  Keep the wound area clean and dry. Remove the dressing the day after you return home (usually 48 hours after the procedure).  DO NOT SUBMERGE UNDER WATER UNTIL FULLY HEALED (no tub baths, hot tubs, swimming pools, etc.).  You may shower or take a sponge bath after the dressing is removed. DO NOT SOAK the area and do not allow the shower to directly spray on the site.  If you have staples, these will be removed in the office in 7-14 days.  If you have tape/steri-strips on your wound, these will fall off; do not pull them off prematurely.  No bandage is needed on the site. DO NOT apply any creams, oils, or ointments to the wound area.  If you notice any drainage or discharge from the wound, any swelling, excessive redness or bruising at the site, or if you develop a fever > 101? F after you are discharged home, call the office at once.  SPECIAL INSTRUCTIONS  You are still able to use cellular telephones. Avoid carrying your cellular phone near your device.  When traveling through airports, show security personnel your identification card to avoid being screened in the metal detectors.  Avoid arc welding equipment, MRI testing (magnetic resonance imaging), TENS units (transcutaneous nerve stimulators). Call the office for questions about other devices.  Avoid electrical appliances that are in poor condition or are not properly grounded.  Microwave ovens are safe to be near or to  operate.   Additional information for defibrillator patients should your device go off: - If your device goes off ONCE and you feel fine afterward, notify the device clinic nurses. - If your device goes off ONCE and you do not feel well afterward, call 911. - If your device goes off TWICE, call 911. - If your device goes off THREE times in one day, call 911.  DO NOT DRIVE YOURSELF OR A FAMILY MEMBER WITH A DEFIBRILLATOR TO THE HOSPITAL--CALL 911.
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
## 2017-07-28 NOTE — Telephone Encounter (Signed)
Received call from care mgr that she received call from patient who had changed her mind about home health, and requested [facility] PT and aide. Was unable to place IP orders as patient had been dc'd greater than 2 hours ago; placed OP order for CM to act on .Dunn PA-C

## 2017-07-28 NOTE — Care Management Note (Addendum)
Case Management Note  Patient Details  Name: Kimberly Downs MRN: [id number] Date of Birth: 10/03/1945  Subjective/Objective:  From home alone in retirement apartments, s/p ICD implant, she states her friend Jackolyn Confer will be checking on her she is just 3 apartments down.  She also has a med alert that she uses, she has a walker at a friends house she can use if needed.  She does not have any family here in [place].  She states she will be fine , she was very indep pta. NCM asked if she could use HH services, she states no. NCM informed RN Clair Gulling of this info.  She is for dc today.     11/29 [place], BSN -received call from patient stating she is at home now and realize she does need a HHRN and aide.  NCM contacted Hinton Dyer PA gave her this information and she put order in for [facility] and [facility], patient would like [facility], referral made to [facility] with [facility] for [facility], [facility].  Soc will begin 24-48 hrs post dc.                Action/Plan: DC home today.  Expected Discharge Date:  07/28/17               Expected Discharge Plan:  Home/Self Care  Home health  In-House Referral:     Discharge planning Services  CM Consult  Post Acute Care Choice:    Choice offered to:     DME Arranged:    DME Agency:     HH Arranged:   HHRN, [place] Agency:   [facility]  Status of Service:  Completed, signed off  If discussed at [place] of Stay Meetings, dates discussed:    Additional Comments:  Zenon Mayo, RN 07/28/2017, 9:46 AM

## 2017-07-29 ENCOUNTER — Telehealth: Payer: Self-pay | Admitting: Cardiovascular Disease

## 2017-07-29 DIAGNOSIS — S51002D Unspecified open wound of left elbow, subsequent encounter: Secondary | ICD-10-CM | POA: Diagnosis not present

## 2017-07-29 DIAGNOSIS — J449 Chronic obstructive pulmonary disease, unspecified: Secondary | ICD-10-CM | POA: Diagnosis not present

## 2017-07-29 DIAGNOSIS — F17210 Nicotine dependence, cigarettes, uncomplicated: Secondary | ICD-10-CM | POA: Diagnosis not present

## 2017-07-29 DIAGNOSIS — Z48812 Encounter for surgical aftercare following surgery on the circulatory system: Secondary | ICD-10-CM | POA: Diagnosis not present

## 2017-07-29 DIAGNOSIS — Z7951 Long term (current) use of inhaled steroids: Secondary | ICD-10-CM | POA: Diagnosis not present

## 2017-07-29 DIAGNOSIS — Z7982 Long term (current) use of aspirin: Secondary | ICD-10-CM | POA: Diagnosis not present

## 2017-07-29 DIAGNOSIS — S51802D Unspecified open wound of left forearm, subsequent encounter: Secondary | ICD-10-CM | POA: Diagnosis not present

## 2017-07-29 DIAGNOSIS — I5040 Unspecified combined systolic (congestive) and diastolic (congestive) heart failure: Secondary | ICD-10-CM | POA: Diagnosis not present

## 2017-07-29 DIAGNOSIS — G8929 Other chronic pain: Secondary | ICD-10-CM | POA: Diagnosis not present

## 2017-07-29 DIAGNOSIS — Z95810 Presence of automatic (implantable) cardiac defibrillator: Secondary | ICD-10-CM | POA: Diagnosis not present

## 2017-07-29 DIAGNOSIS — Z7952 Long term (current) use of systemic steroids: Secondary | ICD-10-CM | POA: Diagnosis not present

## 2017-07-29 NOTE — Telephone Encounter (Signed)
New message  [facility] verbalized that she is calling for the rn   that a spot on her left elbow and its bleeding  Pt is taking a baby asprin and she has been taking it for 10 years   Should pt continue or can she stop

## 2017-07-29 NOTE — Telephone Encounter (Signed)
[facility] notified she will continue ASA 81mg 

## 2017-07-29 NOTE — Telephone Encounter (Signed)
Please hold gentle prolonged pressure on the elbow. She should continue the aspirin. MCr

## 2017-07-30 ENCOUNTER — Emergency Department (HOSPITAL_COMMUNITY)
Admission: EM | Admit: 2017-07-30 | Discharge: 2017-07-30 | Disposition: A | Discharge status: Home / Self Care | Payer: Medicare Other | Attending: Emergency Medicine | Admitting: Emergency Medicine

## 2017-07-30 ENCOUNTER — Emergency Department (HOSPITAL_COMMUNITY): Payer: Medicare Other

## 2017-07-30 ENCOUNTER — Other Ambulatory Visit: Payer: Self-pay

## 2017-07-30 DIAGNOSIS — Z95810 Presence of automatic (implantable) cardiac defibrillator: Secondary | ICD-10-CM | POA: Diagnosis present

## 2017-07-30 DIAGNOSIS — I25119 Atherosclerotic heart disease of native coronary artery with unspecified angina pectoris: Secondary | ICD-10-CM | POA: Diagnosis present

## 2017-07-30 DIAGNOSIS — I480 Paroxysmal atrial fibrillation: Secondary | ICD-10-CM | POA: Diagnosis present

## 2017-07-30 DIAGNOSIS — F17200 Nicotine dependence, unspecified, uncomplicated: Secondary | ICD-10-CM | POA: Insufficient documentation

## 2017-07-30 DIAGNOSIS — R4182 Altered mental status, unspecified: Secondary | ICD-10-CM | POA: Diagnosis not present

## 2017-07-30 DIAGNOSIS — F05 Delirium due to known physiological condition: Secondary | ICD-10-CM

## 2017-07-30 DIAGNOSIS — E119 Type 2 diabetes mellitus without complications: Secondary | ICD-10-CM | POA: Insufficient documentation

## 2017-07-30 DIAGNOSIS — R402441 Other coma, without documented Glasgow coma scale score, or with partial score reported, in the field [EMT or ambulance]: Secondary | ICD-10-CM | POA: Diagnosis not present

## 2017-07-30 DIAGNOSIS — Z79899 Other long term (current) drug therapy: Secondary | ICD-10-CM | POA: Insufficient documentation

## 2017-07-30 DIAGNOSIS — E7800 Pure hypercholesterolemia, unspecified: Secondary | ICD-10-CM | POA: Diagnosis present

## 2017-07-30 DIAGNOSIS — I428 Other cardiomyopathies: Secondary | ICD-10-CM

## 2017-07-30 DIAGNOSIS — I2510 Atherosclerotic heart disease of native coronary artery without angina pectoris: Secondary | ICD-10-CM | POA: Diagnosis not present

## 2017-07-30 DIAGNOSIS — J449 Chronic obstructive pulmonary disease, unspecified: Secondary | ICD-10-CM | POA: Diagnosis not present

## 2017-07-30 DIAGNOSIS — D649 Anemia, unspecified: Secondary | ICD-10-CM | POA: Diagnosis present

## 2017-07-30 DIAGNOSIS — R0989 Other specified symptoms and signs involving the circulatory and respiratory systems: Secondary | ICD-10-CM | POA: Diagnosis not present

## 2017-07-30 DIAGNOSIS — G2581 Restless legs syndrome: Secondary | ICD-10-CM

## 2017-07-30 DIAGNOSIS — E1129 Type 2 diabetes mellitus with other diabetic kidney complication: Secondary | ICD-10-CM | POA: Diagnosis present

## 2017-07-30 DIAGNOSIS — F32A Depression, unspecified: Secondary | ICD-10-CM | POA: Diagnosis present

## 2017-07-30 DIAGNOSIS — E785 Hyperlipidemia, unspecified: Secondary | ICD-10-CM | POA: Diagnosis not present

## 2017-07-30 DIAGNOSIS — R531 Weakness: Secondary | ICD-10-CM | POA: Diagnosis not present

## 2017-07-30 DIAGNOSIS — Z720 Tobacco use: Secondary | ICD-10-CM | POA: Diagnosis present

## 2017-07-30 DIAGNOSIS — F329 Major depressive disorder, single episode, unspecified: Secondary | ICD-10-CM | POA: Diagnosis present

## 2017-07-30 DIAGNOSIS — I10 Essential (primary) hypertension: Secondary | ICD-10-CM | POA: Diagnosis present

## 2017-07-30 DIAGNOSIS — I5042 Chronic combined systolic (congestive) and diastolic (congestive) heart failure: Secondary | ICD-10-CM | POA: Diagnosis present

## 2017-07-30 LAB — CBC WITH DIFFERENTIAL/PLATELET
BASOS ABS: 0 10*3/uL (ref 0.0–0.1)
BASOS PCT: 0 %
Eosinophils Absolute: 0 10*3/uL (ref 0.0–0.7)
Eosinophils Relative: 0 %
HEMATOCRIT: 39.3 % (ref 36.0–46.0)
HEMOGLOBIN: 12.4 g/dL (ref 12.0–15.0)
LYMPHS PCT: 14 %
Lymphs Abs: 1.6 10*3/uL (ref 0.7–4.0)
MCH: 27.2 pg (ref 26.0–34.0)
MCHC: 31.6 g/dL (ref 30.0–36.0)
MCV: 86.2 fL (ref 78.0–100.0)
MONO ABS: 0.6 10*3/uL (ref 0.1–1.0)
Monocytes Relative: 6 %
NEUTROS ABS: 8.6 10*3/uL — AB (ref 1.7–7.7)
NEUTROS PCT: 80 %
Platelets: 292 10*3/uL (ref 150–400)
RBC: 4.56 MIL/uL (ref 3.87–5.11)
RDW: 14.2 % (ref 11.5–15.5)
WBC: 10.8 10*3/uL — ABNORMAL HIGH (ref 4.0–10.5)

## 2017-07-30 LAB — I-STAT ARTERIAL BLOOD GAS, ED
Acid-Base Excess: 2 mmol/L (ref 0.0–2.0)
BICARBONATE: 26 mmol/L (ref 20.0–28.0)
O2 Saturation: 96 %
PCO2 ART: 37.9 mmHg (ref 32.0–48.0)
TCO2: 27 mmol/L (ref 22–32)
pH, Arterial: 7.446 (ref 7.350–7.450)
pO2, Arterial: 76 mmHg — ABNORMAL LOW (ref 83.0–108.0)

## 2017-07-30 LAB — COMPREHENSIVE METABOLIC PANEL
ALBUMIN: 4 g/dL (ref 3.5–5.0)
ALK PHOS: 125 U/L (ref 38–126)
ALT: 14 U/L (ref 14–54)
AST: 27 U/L (ref 15–41)
Anion gap: 9 (ref 5–15)
BILIRUBIN TOTAL: 1.2 mg/dL (ref 0.3–1.2)
BUN: 12 mg/dL (ref 6–20)
CALCIUM: 9.6 mg/dL (ref 8.9–10.3)
CO2: 26 mmol/L (ref 22–32)
CREATININE: 1.06 mg/dL — AB (ref 0.44–1.00)
Chloride: 105 mmol/L (ref 101–111)
GFR calc Af Amer: 59 mL/min — ABNORMAL LOW (ref >60)
GFR, EST NON AFRICAN AMERICAN: 51 mL/min — AB (ref >60)
GLUCOSE: 116 mg/dL — AB (ref 65–99)
POTASSIUM: 3.8 mmol/L (ref 3.5–5.1)
Sodium: 140 mmol/L (ref 135–145)
TOTAL PROTEIN: 7.5 g/dL (ref 6.5–8.1)

## 2017-07-30 LAB — CBG MONITORING, ED: GLUCOSE-CAPILLARY: 113 mg/dL — AB (ref 65–99)

## 2017-07-30 MED ORDER — NALOXONE HCL 0.4 MG/ML IJ SOLN
0.4000 mg | Freq: Once | INTRAMUSCULAR | Status: AC
  Administered 2017-07-30: 0.4 mg via INTRAVENOUS
  Filled 2017-07-30: qty 1
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
## 2017-07-30 NOTE — ED Triage Notes (Signed)
Pt brought in by [facility] for AMS that started yesterday at 1700. Per EMS pt had pacemaker placed x2 days ago here at [facility], and per neighbor new onset of AMS started yesterday. Pt baseline is A+Ox4, pt currently only oriented to self. When asked if she knows where she is pt simply nods head yes but is unable to verbalize answers.

## 2017-07-30 NOTE — Progress Notes (Signed)
ABG results did not cross over in epic  PH 7.446 CO2 37.9 PaO2 76 HCO3 26.0 BE 2  Pt on Room Air for ABG.

## 2017-08-02 DIAGNOSIS — Z48812 Encounter for surgical aftercare following surgery on the circulatory system: Secondary | ICD-10-CM | POA: Diagnosis not present

## 2017-08-02 DIAGNOSIS — J449 Chronic obstructive pulmonary disease, unspecified: Secondary | ICD-10-CM | POA: Diagnosis not present

## 2017-08-02 DIAGNOSIS — S51002D Unspecified open wound of left elbow, subsequent encounter: Secondary | ICD-10-CM | POA: Diagnosis not present

## 2017-08-02 DIAGNOSIS — I5040 Unspecified combined systolic (congestive) and diastolic (congestive) heart failure: Secondary | ICD-10-CM | POA: Diagnosis not present

## 2017-08-02 DIAGNOSIS — G8929 Other chronic pain: Secondary | ICD-10-CM | POA: Diagnosis not present

## 2017-08-02 DIAGNOSIS — S51802D Unspecified open wound of left forearm, subsequent encounter: Secondary | ICD-10-CM | POA: Diagnosis not present

## 2017-08-08 ENCOUNTER — Ambulatory Visit: Payer: Medicare Other

## 2017-08-09 DIAGNOSIS — S51802D Unspecified open wound of left forearm, subsequent encounter: Secondary | ICD-10-CM | POA: Diagnosis not present

## 2017-08-09 DIAGNOSIS — G8929 Other chronic pain: Secondary | ICD-10-CM | POA: Diagnosis not present

## 2017-08-09 DIAGNOSIS — Z48812 Encounter for surgical aftercare following surgery on the circulatory system: Secondary | ICD-10-CM | POA: Diagnosis not present

## 2017-08-09 DIAGNOSIS — J449 Chronic obstructive pulmonary disease, unspecified: Secondary | ICD-10-CM | POA: Diagnosis not present

## 2017-08-09 DIAGNOSIS — S51002D Unspecified open wound of left elbow, subsequent encounter: Secondary | ICD-10-CM | POA: Diagnosis not present

## 2017-08-09 DIAGNOSIS — I5040 Unspecified combined systolic (congestive) and diastolic (congestive) heart failure: Secondary | ICD-10-CM | POA: Diagnosis not present

## 2017-08-11 ENCOUNTER — Other Ambulatory Visit: Payer: Self-pay | Admitting: Family Medicine

## 2017-08-11 ENCOUNTER — Telehealth: Payer: Self-pay | Admitting: Cardiovascular Disease

## 2017-08-11 DIAGNOSIS — G8929 Other chronic pain: Secondary | ICD-10-CM | POA: Diagnosis not present

## 2017-08-11 DIAGNOSIS — S51802D Unspecified open wound of left forearm, subsequent encounter: Secondary | ICD-10-CM | POA: Diagnosis not present

## 2017-08-11 DIAGNOSIS — M25551 Pain in right hip: Secondary | ICD-10-CM

## 2017-08-11 DIAGNOSIS — I5040 Unspecified combined systolic (congestive) and diastolic (congestive) heart failure: Secondary | ICD-10-CM | POA: Diagnosis not present

## 2017-08-11 DIAGNOSIS — S51002D Unspecified open wound of left elbow, subsequent encounter: Secondary | ICD-10-CM | POA: Diagnosis not present

## 2017-08-11 DIAGNOSIS — J449 Chronic obstructive pulmonary disease, unspecified: Secondary | ICD-10-CM | POA: Diagnosis not present

## 2017-08-11 DIAGNOSIS — Z48812 Encounter for surgical aftercare following surgery on the circulatory system: Secondary | ICD-10-CM | POA: Diagnosis not present
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
## 2017-08-11 NOTE — Telephone Encounter (Signed)
OK for HH to remove staples MCr
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
## 2017-08-12 ENCOUNTER — Ambulatory Visit: Payer: Medicare Other

## 2017-08-12 ENCOUNTER — Telehealth: Payer: Self-pay | Admitting: Cardiovascular Disease

## 2017-08-12 NOTE — Telephone Encounter (Signed)
Order sent to [facility].  Will schedule remote for 2 weeks from now to evaluate device function post procedure. Will also make sure patient has 3 month f/u with [facility].

## 2017-08-12 NOTE — Telephone Encounter (Signed)
Returned the call to the patient to inform her that per Epic notes that this has been completed already. She verbalized her understanding.

## 2017-08-12 NOTE — Telephone Encounter (Signed)
New message     Patient calling to request a phone call be made to give verbal order to remove staples. Please call [place] [phone] or fax [phone].

## 2017-08-14 DIAGNOSIS — J449 Chronic obstructive pulmonary disease, unspecified: Secondary | ICD-10-CM | POA: Diagnosis not present

## 2017-08-14 DIAGNOSIS — Z48812 Encounter for surgical aftercare following surgery on the circulatory system: Secondary | ICD-10-CM | POA: Diagnosis not present

## 2017-08-14 DIAGNOSIS — S51002D Unspecified open wound of left elbow, subsequent encounter: Secondary | ICD-10-CM | POA: Diagnosis not present

## 2017-08-14 DIAGNOSIS — G8929 Other chronic pain: Secondary | ICD-10-CM | POA: Diagnosis not present

## 2017-08-14 DIAGNOSIS — S51802D Unspecified open wound of left forearm, subsequent encounter: Secondary | ICD-10-CM | POA: Diagnosis not present

## 2017-08-14 DIAGNOSIS — I5040 Unspecified combined systolic (congestive) and diastolic (congestive) heart failure: Secondary | ICD-10-CM | POA: Diagnosis not present

## 2017-08-15 DIAGNOSIS — Z48812 Encounter for surgical aftercare following surgery on the circulatory system: Secondary | ICD-10-CM | POA: Diagnosis not present

## 2017-08-15 DIAGNOSIS — S51802D Unspecified open wound of left forearm, subsequent encounter: Secondary | ICD-10-CM | POA: Diagnosis not present

## 2017-08-15 DIAGNOSIS — S51002D Unspecified open wound of left elbow, subsequent encounter: Secondary | ICD-10-CM | POA: Diagnosis not present

## 2017-08-15 DIAGNOSIS — G8929 Other chronic pain: Secondary | ICD-10-CM | POA: Diagnosis not present

## 2017-08-15 DIAGNOSIS — J449 Chronic obstructive pulmonary disease, unspecified: Secondary | ICD-10-CM | POA: Diagnosis not present

## 2017-08-15 DIAGNOSIS — I5040 Unspecified combined systolic (congestive) and diastolic (congestive) heart failure: Secondary | ICD-10-CM | POA: Diagnosis not present

## 2017-08-18 ENCOUNTER — Ambulatory Visit (INDEPENDENT_AMBULATORY_CARE_PROVIDER_SITE_OTHER): Payer: Medicare Other | Admitting: *Deleted

## 2017-08-18 DIAGNOSIS — S51002D Unspecified open wound of left elbow, subsequent encounter: Secondary | ICD-10-CM | POA: Diagnosis not present

## 2017-08-18 DIAGNOSIS — G8929 Other chronic pain: Secondary | ICD-10-CM | POA: Diagnosis not present

## 2017-08-18 DIAGNOSIS — I428 Other cardiomyopathies: Secondary | ICD-10-CM

## 2017-08-18 DIAGNOSIS — I480 Paroxysmal atrial fibrillation: Secondary | ICD-10-CM

## 2017-08-18 DIAGNOSIS — I5042 Chronic combined systolic (congestive) and diastolic (congestive) heart failure: Secondary | ICD-10-CM | POA: Diagnosis not present

## 2017-08-18 DIAGNOSIS — I5040 Unspecified combined systolic (congestive) and diastolic (congestive) heart failure: Secondary | ICD-10-CM | POA: Diagnosis not present

## 2017-08-18 DIAGNOSIS — S51802D Unspecified open wound of left forearm, subsequent encounter: Secondary | ICD-10-CM | POA: Diagnosis not present

## 2017-08-18 DIAGNOSIS — J449 Chronic obstructive pulmonary disease, unspecified: Secondary | ICD-10-CM | POA: Diagnosis not present

## 2017-08-18 DIAGNOSIS — Z48812 Encounter for surgical aftercare following surgery on the circulatory system: Secondary | ICD-10-CM | POA: Diagnosis not present

## 2017-08-22 LAB — CUP PACEART INCLINIC DEVICE CHECK
Battery Remaining Longevity: 138 mo
Battery Voltage: 3.15 V
Brady Statistic RV Percent Paced: 0.05 %
HIGH POWER IMPEDANCE MEASURED VALUE: 67 Ohm
Implantable Lead Implant Date: 20181128
Lead Channel Impedance Value: 209 Ohm
Lead Channel Impedance Value: 304 Ohm
Lead Channel Sensing Intrinsic Amplitude: 9.875 mV
Lead Channel Setting Pacing Amplitude: 3.5 V
Lead Channel Setting Pacing Pulse Width: 0.4 ms
MDC IDC LEAD LOCATION: 753860
MDC IDC MSMT LEADCHNL RV PACING THRESHOLD AMPLITUDE: 1 V
MDC IDC MSMT LEADCHNL RV PACING THRESHOLD PULSEWIDTH: 0.4 ms
MDC IDC MSMT LEADCHNL RV SENSING INTR AMPL: 9.875 mV
MDC IDC PG IMPLANT DT: 20181128
MDC IDC SESS DTM: 20181220173800
MDC IDC SET LEADCHNL RV SENSING SENSITIVITY: 0.3 mV

## 2017-08-22 NOTE — Progress Notes (Signed)
Wound check appointment. Staples removed by [facility] per [facility] order. Steri-strips, applied by [facility], removed this ov. Wound without redness or edema. Incision edges approximated, wound well healed. Normal device function. Threshold, sensing, and impedances consistent with implant measurements. Device programmed at 3.5V for extra safety margin until 3 month visit. Histogram distribution appropriate for patient and level of activity. (2) "ventricular arrhythmias" noted--?SVT per EGMs. Patient educated about wound care, arm mobility, lifting restrictions, shock plan. ROV in 3 months with [facility].

## 2017-08-25 DIAGNOSIS — E538 Deficiency of other specified B group vitamins: Secondary | ICD-10-CM | POA: Diagnosis not present

## 2017-08-25 DIAGNOSIS — E559 Vitamin D deficiency, unspecified: Secondary | ICD-10-CM | POA: Diagnosis not present

## 2017-08-25 DIAGNOSIS — I119 Hypertensive heart disease without heart failure: Secondary | ICD-10-CM | POA: Diagnosis not present

## 2017-08-25 DIAGNOSIS — M62838 Other muscle spasm: Secondary | ICD-10-CM | POA: Diagnosis not present

## 2017-08-25 DIAGNOSIS — M353 Polymyalgia rheumatica: Secondary | ICD-10-CM | POA: Diagnosis not present

## 2017-08-25 DIAGNOSIS — J449 Chronic obstructive pulmonary disease, unspecified: Secondary | ICD-10-CM | POA: Diagnosis not present

## 2017-08-25 DIAGNOSIS — M810 Age-related osteoporosis without current pathological fracture: Secondary | ICD-10-CM | POA: Diagnosis not present

## 2017-08-25 DIAGNOSIS — E1140 Type 2 diabetes mellitus with diabetic neuropathy, unspecified: Secondary | ICD-10-CM | POA: Diagnosis not present

## 2017-08-25 DIAGNOSIS — I509 Heart failure, unspecified: Secondary | ICD-10-CM | POA: Diagnosis not present

## 2017-08-25 DIAGNOSIS — M2550 Pain in unspecified joint: Secondary | ICD-10-CM | POA: Diagnosis not present

## 2017-08-25 DIAGNOSIS — R413 Other amnesia: Secondary | ICD-10-CM | POA: Diagnosis not present

## 2017-08-31 ENCOUNTER — Telehealth: Payer: Self-pay | Admitting: Cardiovascular Disease

## 2017-08-31 NOTE — Telephone Encounter (Signed)
Spoke with ann, she reports the patient is having dizziness and falling. The patient is reporting to her episodes of feeling hot and sweats. The patient has lost 14 lbs in a 2 week span. Spoke with pt, she is getting dizziness when standing from a sitting position and when turning around quickly. She has been evaluated in the past for vertigo and was told by neurology they did not know what the cause of her dizziness was. She is also having a pain under her arm, behind her device in her back that radiates around her back. The tramadol or nothing helps, today the pain level is 6. Offered appointment for patient to be seen but she declined because nobody can find out why she is dizzy. Follow up made with [facility] later in January per recall. Will forward to dr berry and to the device clinic to review and advise.

## 2017-08-31 NOTE — Telephone Encounter (Signed)
Ann calling with [facility],  Pt c/o BP issue: STAT if pt c/o blurred vision, one-sided weakness or slurred speech  1. What are your last 5 BP readings? 08-30-17--> 170/111 and today 152/90  2. Are you having any other symptoms (ex. Dizziness, headache, blurred vision, passed out)? Dizziness  3. What is your BP issue? Patient dropped 14 lbs, no appetite and pain in back

## 2017-08-31 NOTE — Telephone Encounter (Signed)
Spoke with patient who was not at home at the time. She will send a remote transmission when she gets back to the house. ~ 1/2 hour or so.

## 2017-08-31 NOTE — Telephone Encounter (Signed)
Reviewed remote transmission which shows 1 NSVT episode x 7 beats with 100% VS. Patients described pain emulates musculoskeletal pain. Device is working well. Information was relay to patient who verbalized understanding and states that she will call back if she has any other concerns.

## 2017-09-09 ENCOUNTER — Other Ambulatory Visit: Payer: Self-pay | Admitting: Family Medicine

## 2017-09-09 ENCOUNTER — Ambulatory Visit (HOSPITAL_BASED_OUTPATIENT_CLINIC_OR_DEPARTMENT_OTHER): Payer: Medicare Other

## 2017-09-09 DIAGNOSIS — R42 Dizziness and giddiness: Secondary | ICD-10-CM

## 2017-09-10 ENCOUNTER — Ambulatory Visit (HOSPITAL_BASED_OUTPATIENT_CLINIC_OR_DEPARTMENT_OTHER)
Admission: RE | Admit: 2017-09-10 | Discharge: 2017-09-10 | Disposition: A | Discharge status: Home / Self Care | Payer: Medicare Other | Source: Ambulatory Visit | Attending: Family Medicine | Admitting: Family Medicine

## 2017-09-10 DIAGNOSIS — G319 Degenerative disease of nervous system, unspecified: Secondary | ICD-10-CM | POA: Diagnosis not present

## 2017-09-10 DIAGNOSIS — R42 Dizziness and giddiness: Secondary | ICD-10-CM | POA: Insufficient documentation

## 2017-09-20 ENCOUNTER — Encounter: Payer: Self-pay | Admitting: Cardiology

## 2017-09-22 ENCOUNTER — Other Ambulatory Visit: Payer: Self-pay

## 2017-09-22 ENCOUNTER — Emergency Department (HOSPITAL_BASED_OUTPATIENT_CLINIC_OR_DEPARTMENT_OTHER): Payer: Medicare Other

## 2017-09-22 ENCOUNTER — Inpatient Hospital Stay (HOSPITAL_BASED_OUTPATIENT_CLINIC_OR_DEPARTMENT_OTHER)
Admission: EM | Admit: 2017-09-22 | Discharge: 2017-09-25 | DRG: 378 | Disposition: A | Discharge status: Home Health | Payer: Medicare Other | Attending: Internal Medicine | Admitting: Internal Medicine

## 2017-09-22 ENCOUNTER — Encounter (HOSPITAL_BASED_OUTPATIENT_CLINIC_OR_DEPARTMENT_OTHER): Payer: Self-pay | Admitting: Emergency Medicine

## 2017-09-22 DIAGNOSIS — Z6820 Body mass index (BMI) 20.0-20.9, adult: Secondary | ICD-10-CM | POA: Diagnosis not present

## 2017-09-22 DIAGNOSIS — R42 Dizziness and giddiness: Secondary | ICD-10-CM | POA: Diagnosis not present

## 2017-09-22 DIAGNOSIS — E785 Hyperlipidemia, unspecified: Secondary | ICD-10-CM | POA: Diagnosis not present

## 2017-09-22 DIAGNOSIS — Z79899 Other long term (current) drug therapy: Secondary | ICD-10-CM

## 2017-09-22 DIAGNOSIS — Z888 Allergy status to other drugs, medicaments and biological substances status: Secondary | ICD-10-CM

## 2017-09-22 DIAGNOSIS — E440 Moderate protein-calorie malnutrition: Secondary | ICD-10-CM

## 2017-09-22 DIAGNOSIS — I5042 Chronic combined systolic (congestive) and diastolic (congestive) heart failure: Secondary | ICD-10-CM | POA: Diagnosis present

## 2017-09-22 DIAGNOSIS — I25119 Atherosclerotic heart disease of native coronary artery with unspecified angina pectoris: Secondary | ICD-10-CM | POA: Diagnosis present

## 2017-09-22 DIAGNOSIS — Z95810 Presence of automatic (implantable) cardiac defibrillator: Secondary | ICD-10-CM | POA: Diagnosis not present

## 2017-09-22 DIAGNOSIS — Z87442 Personal history of urinary calculi: Secondary | ICD-10-CM | POA: Diagnosis not present

## 2017-09-22 DIAGNOSIS — Z886 Allergy status to analgesic agent status: Secondary | ICD-10-CM

## 2017-09-22 DIAGNOSIS — M353 Polymyalgia rheumatica: Secondary | ICD-10-CM | POA: Diagnosis present

## 2017-09-22 DIAGNOSIS — K921 Melena: Secondary | ICD-10-CM | POA: Diagnosis present

## 2017-09-22 DIAGNOSIS — Z8711 Personal history of peptic ulcer disease: Secondary | ICD-10-CM | POA: Diagnosis not present

## 2017-09-22 DIAGNOSIS — Z90710 Acquired absence of both cervix and uterus: Secondary | ICD-10-CM | POA: Diagnosis not present

## 2017-09-22 DIAGNOSIS — F17210 Nicotine dependence, cigarettes, uncomplicated: Secondary | ICD-10-CM | POA: Diagnosis not present

## 2017-09-22 DIAGNOSIS — Z8249 Family history of ischemic heart disease and other diseases of the circulatory system: Secondary | ICD-10-CM | POA: Diagnosis not present

## 2017-09-22 DIAGNOSIS — J449 Chronic obstructive pulmonary disease, unspecified: Secondary | ICD-10-CM | POA: Diagnosis not present

## 2017-09-22 DIAGNOSIS — I110 Hypertensive heart disease with heart failure: Secondary | ICD-10-CM | POA: Diagnosis not present

## 2017-09-22 DIAGNOSIS — I429 Cardiomyopathy, unspecified: Secondary | ICD-10-CM | POA: Diagnosis present

## 2017-09-22 DIAGNOSIS — D62 Acute posthemorrhagic anemia: Secondary | ICD-10-CM | POA: Diagnosis not present

## 2017-09-22 DIAGNOSIS — E1129 Type 2 diabetes mellitus with other diabetic kidney complication: Secondary | ICD-10-CM | POA: Diagnosis present

## 2017-09-22 DIAGNOSIS — R1310 Dysphagia, unspecified: Secondary | ICD-10-CM | POA: Diagnosis not present

## 2017-09-22 DIAGNOSIS — K922 Gastrointestinal hemorrhage, unspecified: Secondary | ICD-10-CM | POA: Diagnosis not present

## 2017-09-22 DIAGNOSIS — Z955 Presence of coronary angioplasty implant and graft: Secondary | ICD-10-CM | POA: Diagnosis not present

## 2017-09-22 DIAGNOSIS — Z7902 Long term (current) use of antithrombotics/antiplatelets: Secondary | ICD-10-CM

## 2017-09-22 DIAGNOSIS — I480 Paroxysmal atrial fibrillation: Secondary | ICD-10-CM | POA: Diagnosis not present

## 2017-09-22 DIAGNOSIS — Z881 Allergy status to other antibiotic agents status: Secondary | ICD-10-CM

## 2017-09-22 DIAGNOSIS — I428 Other cardiomyopathies: Secondary | ICD-10-CM

## 2017-09-22 DIAGNOSIS — Z7952 Long term (current) use of systemic steroids: Secondary | ICD-10-CM

## 2017-09-22 DIAGNOSIS — R6881 Early satiety: Secondary | ICD-10-CM | POA: Diagnosis present

## 2017-09-22 DIAGNOSIS — Z7951 Long term (current) use of inhaled steroids: Secondary | ICD-10-CM

## 2017-09-22 DIAGNOSIS — Z9049 Acquired absence of other specified parts of digestive tract: Secondary | ICD-10-CM

## 2017-09-22 DIAGNOSIS — Z885 Allergy status to narcotic agent status: Secondary | ICD-10-CM

## 2017-09-22 LAB — COMPREHENSIVE METABOLIC PANEL
ALBUMIN: 4 g/dL (ref 3.5–5.0)
ALK PHOS: 120 U/L (ref 38–126)
ALT: 16 U/L (ref 14–54)
ANION GAP: 10 (ref 5–15)
AST: 27 U/L (ref 15–41)
BUN: 19 mg/dL (ref 6–20)
CO2: 29 mmol/L (ref 22–32)
Calcium: 9.4 mg/dL (ref 8.9–10.3)
Chloride: 100 mmol/L — ABNORMAL LOW (ref 101–111)
Creatinine, Ser: 1.16 mg/dL — ABNORMAL HIGH (ref 0.44–1.00)
GFR calc Af Amer: 53 mL/min — ABNORMAL LOW (ref >60)
GFR calc non Af Amer: 46 mL/min — ABNORMAL LOW (ref >60)
GLUCOSE: 166 mg/dL — AB (ref 65–99)
POTASSIUM: 3.2 mmol/L — AB (ref 3.5–5.1)
SODIUM: 139 mmol/L (ref 135–145)
Total Bilirubin: 0.8 mg/dL (ref 0.3–1.2)
Total Protein: 6.9 g/dL (ref 6.5–8.1)

## 2017-09-22 LAB — URINALYSIS, ROUTINE W REFLEX MICROSCOPIC
Bilirubin Urine: NEGATIVE
Glucose, UA: NEGATIVE mg/dL
Hgb urine dipstick: NEGATIVE
KETONES UR: NEGATIVE mg/dL
LEUKOCYTES UA: NEGATIVE
NITRITE: NEGATIVE
PROTEIN: NEGATIVE mg/dL
Specific Gravity, Urine: 1.015 (ref 1.005–1.030)
pH: 6.5 (ref 5.0–8.0)

## 2017-09-22 LAB — CBC
HCT: 33.4 % — ABNORMAL LOW (ref 36.0–46.0)
Hemoglobin: 10.5 g/dL — ABNORMAL LOW (ref 12.0–15.0)
MCH: 26.3 pg (ref 26.0–34.0)
MCHC: 31.4 g/dL (ref 30.0–36.0)
MCV: 83.5 fL (ref 78.0–100.0)
PLATELETS: 205 10*3/uL (ref 150–400)
RBC: 4 MIL/uL (ref 3.87–5.11)
RDW: 14.6 % (ref 11.5–15.5)
WBC: 5.8 10*3/uL (ref 4.0–10.5)

## 2017-09-22 LAB — CBC WITH DIFFERENTIAL/PLATELET
BASOS ABS: 0 10*3/uL (ref 0.0–0.1)
BASOS PCT: 0 %
EOS ABS: 0 10*3/uL (ref 0.0–0.7)
Eosinophils Relative: 0 %
HCT: 35.6 % — ABNORMAL LOW (ref 36.0–46.0)
Hemoglobin: 11.2 g/dL — ABNORMAL LOW (ref 12.0–15.0)
Lymphocytes Relative: 10 %
Lymphs Abs: 0.8 10*3/uL (ref 0.7–4.0)
MCH: 26.4 pg (ref 26.0–34.0)
MCHC: 31.5 g/dL (ref 30.0–36.0)
MCV: 83.8 fL (ref 78.0–100.0)
MONOS PCT: 2 %
Monocytes Absolute: 0.2 10*3/uL (ref 0.1–1.0)
NEUTROS PCT: 88 %
Neutro Abs: 7 10*3/uL (ref 1.7–7.7)
Platelets: 238 10*3/uL (ref 150–400)
RBC: 4.25 MIL/uL (ref 3.87–5.11)
RDW: 14.6 % (ref 11.5–15.5)
WBC: 8 10*3/uL (ref 4.0–10.5)

## 2017-09-22 LAB — I-STAT CG4 LACTIC ACID, ED: Lactic Acid, Venous: 1.7 mmol/L (ref 0.5–1.9)

## 2017-09-22 LAB — PROTIME-INR
INR: 0.94
Prothrombin Time: 12.5 seconds (ref 11.4–15.2)

## 2017-09-22 LAB — TYPE AND SCREEN
ABO/RH(D): A NEG
ANTIBODY SCREEN: NEGATIVE

## 2017-09-22 LAB — OCCULT BLOOD X 1 CARD TO LAB, STOOL: FECAL OCCULT BLD: NEGATIVE

## 2017-09-22 MED ORDER — FENTANYL CITRATE (PF) 100 MCG/2ML IJ SOLN
50.0000 ug | Freq: Once | INTRAMUSCULAR | Status: AC
  Administered 2017-09-22: 50 ug via INTRAVENOUS
  Filled 2017-09-22: qty 2

## 2017-09-22 MED ORDER — ATORVASTATIN CALCIUM 10 MG PO TABS
10.0000 mg | ORAL_TABLET | Freq: Every day | ORAL | Status: DC
  Administered 2017-09-23 – 2017-09-24 (×3): 10 mg via ORAL
  Filled 2017-09-22 (×3): qty 1

## 2017-09-22 MED ORDER — ROPINIROLE HCL 1 MG PO TABS
2.0000 mg | ORAL_TABLET | Freq: Every day | ORAL | Status: DC
  Administered 2017-09-23 – 2017-09-24 (×3): 2 mg via ORAL
  Filled 2017-09-22 (×3): qty 2

## 2017-09-22 MED ORDER — ONDANSETRON HCL 4 MG/2ML IJ SOLN: 4.0000 mg | Freq: Four times a day (QID) | INTRAMUSCULAR | Status: DC | PRN

## 2017-09-22 MED ORDER — ACETAMINOPHEN 650 MG RE SUPP: 650.0000 mg | Freq: Four times a day (QID) | RECTAL | Status: DC | PRN

## 2017-09-22 MED ORDER — SODIUM CHLORIDE 0.9 % IV SOLN
80.0000 mg | Freq: Once | INTRAVENOUS | Status: AC
  Administered 2017-09-22: 80 mg via INTRAVENOUS
  Filled 2017-09-22: qty 80

## 2017-09-22 MED ORDER — SODIUM CHLORIDE 0.9 % IV SOLN
INTRAVENOUS | Status: AC
  Administered 2017-09-22: 16:00:00 via INTRAVENOUS

## 2017-09-22 MED ORDER — SODIUM CHLORIDE 0.9 % IV BOLUS (SEPSIS): 500.0000 mL | Freq: Once | INTRAVENOUS | Status: DC

## 2017-09-22 MED ORDER — HYDROMORPHONE HCL 1 MG/ML IJ SOLN
0.5000 mg | INTRAMUSCULAR | Status: DC | PRN
  Administered 2017-09-22 – 2017-09-24 (×7): 0.5 mg via INTRAVENOUS
  Filled 2017-09-22 (×7): qty 0.5

## 2017-09-22 MED ORDER — SODIUM CHLORIDE 0.9 % IV SOLN
INTRAVENOUS | Status: DC
  Administered 2017-09-22: via INTRAVENOUS

## 2017-09-22 MED ORDER — IOPAMIDOL (ISOVUE-300) INJECTION 61%
100.0000 mL | Freq: Once | INTRAVENOUS | Status: AC | PRN
  Administered 2017-09-22: 100 mL via INTRAVENOUS

## 2017-09-22 MED ORDER — HYDROMORPHONE HCL 1 MG/ML IJ SOLN
0.5000 mg | Freq: Once | INTRAMUSCULAR | Status: AC
  Administered 2017-09-22: 0.5 mg via INTRAVENOUS
  Filled 2017-09-22: qty 1

## 2017-09-22 MED ORDER — METHYLPREDNISOLONE SODIUM SUCC 40 MG IJ SOLR
20.0000 mg | Freq: Every day | INTRAMUSCULAR | Status: DC
  Administered 2017-09-23: 20 mg via INTRAVENOUS
  Filled 2017-09-22: qty 1

## 2017-09-22 MED ORDER — ONDANSETRON HCL 4 MG PO TABS: 4.0000 mg | ORAL_TABLET | Freq: Four times a day (QID) | ORAL | Status: DC | PRN

## 2017-09-22 MED ORDER — PANTOPRAZOLE SODIUM 40 MG IV SOLR
INTRAVENOUS | Status: AC
  Filled 2017-09-22: qty 40

## 2017-09-22 MED ORDER — MAGNESIUM OXIDE 400 (241.3 MG) MG PO TABS
200.0000 mg | ORAL_TABLET | Freq: Two times a day (BID) | ORAL | Status: DC
  Administered 2017-09-23 – 2017-09-25 (×5): 200 mg via ORAL
  Filled 2017-09-22 (×5): qty 1

## 2017-09-22 MED ORDER — IRBESARTAN 75 MG PO TABS
75.0000 mg | ORAL_TABLET | Freq: Every day | ORAL | Status: DC
  Administered 2017-09-23 – 2017-09-25 (×2): 75 mg via ORAL
  Filled 2017-09-22 (×2): qty 1

## 2017-09-22 MED ORDER — MOMETASONE FURO-FORMOTEROL FUM 200-5 MCG/ACT IN AERO
2.0000 | INHALATION_SPRAY | Freq: Two times a day (BID) | RESPIRATORY_TRACT | Status: DC
  Administered 2017-09-22 – 2017-09-25 (×6): 2 via RESPIRATORY_TRACT
  Filled 2017-09-22: qty 8.8

## 2017-09-22 MED ORDER — CYCLOBENZAPRINE HCL 10 MG PO TABS
10.0000 mg | ORAL_TABLET | Freq: Three times a day (TID) | ORAL | Status: DC | PRN
  Filled 2017-09-22: qty 1

## 2017-09-22 MED ORDER — SODIUM CHLORIDE 0.9 % IV SOLN
8.0000 mg/h | INTRAVENOUS | Status: DC
  Administered 2017-09-22 – 2017-09-24 (×3): 8 mg/h via INTRAVENOUS
  Filled 2017-09-22 (×6): qty 80

## 2017-09-22 MED ORDER — IPRATROPIUM-ALBUTEROL 0.5-2.5 (3) MG/3ML IN SOLN
3.0000 mL | Freq: Three times a day (TID) | RESPIRATORY_TRACT | Status: DC
Start: 2017-09-22 — End: 2017-09-24
  Administered 2017-09-22 – 2017-09-23 (×4): 3 mL via RESPIRATORY_TRACT
  Filled 2017-09-22 (×4): qty 3

## 2017-09-22 MED ORDER — METOPROLOL TARTRATE 12.5 MG HALF TABLET
12.5000 mg | ORAL_TABLET | Freq: Two times a day (BID) | ORAL | Status: DC
  Administered 2017-09-23 – 2017-09-25 (×4): 12.5 mg via ORAL
  Filled 2017-09-22 (×5): qty 1

## 2017-09-22 MED ORDER — ISOSORBIDE MONONITRATE ER 30 MG PO TB24
30.0000 mg | ORAL_TABLET | Freq: Every day | ORAL | Status: DC
  Administered 2017-09-23 – 2017-09-25 (×2): 30 mg via ORAL
  Filled 2017-09-22 (×2): qty 1

## 2017-09-22 MED ORDER — SODIUM CHLORIDE 0.9 % IV SOLN: 80.0000 mg | Freq: Once | INTRAVENOUS | Status: DC

## 2017-09-22 MED ORDER — PANTOPRAZOLE SODIUM 40 MG IV SOLR: 40.0000 mg | Freq: Two times a day (BID) | INTRAVENOUS | Status: DC

## 2017-09-22 MED ORDER — ONDANSETRON HCL 4 MG/2ML IJ SOLN
4.0000 mg | Freq: Once | INTRAMUSCULAR | Status: AC
  Administered 2017-09-22: 4 mg via INTRAVENOUS
  Filled 2017-09-22: qty 2

## 2017-09-22 MED ORDER — BACLOFEN 10 MG PO TABS: 10.0000 mg | ORAL_TABLET | Freq: Two times a day (BID) | ORAL | Status: DC | PRN

## 2017-09-22 MED ORDER — ACETAMINOPHEN 325 MG PO TABS
650.0000 mg | ORAL_TABLET | Freq: Four times a day (QID) | ORAL | Status: DC | PRN
Start: 2017-09-22 — End: 2017-09-25
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
## 2017-09-22 NOTE — ED Notes (Addendum)
Oxygen saturation decreased to 84% on RA.  Placed on 2L via [place].  Oxygen @ 100% on 2L [place]
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
## 2017-09-22 NOTE — ED Triage Notes (Signed)
Patient reports black stool this morning at 0400.  Reports the pain was bad when she had the bowel movement.  Reports pain to LLQ and flank.  Reports nausea and vomiting.  States she has been unable to eat in over a week.

## 2017-09-22 NOTE — ED Notes (Signed)
Pt concerned about admission and asking for pain medication. EDP made aware and is at bedside.

## 2017-09-22 NOTE — ED Notes (Signed)
[facility] arrived to transport pt to [facility].

## 2017-09-23 ENCOUNTER — Ambulatory Visit: Payer: Medicare Other | Admitting: Cardiology

## 2017-09-23 DIAGNOSIS — Z95810 Presence of automatic (implantable) cardiac defibrillator: Secondary | ICD-10-CM | POA: Diagnosis not present

## 2017-09-23 DIAGNOSIS — I25119 Atherosclerotic heart disease of native coronary artery with unspecified angina pectoris: Secondary | ICD-10-CM

## 2017-09-23 DIAGNOSIS — K922 Gastrointestinal hemorrhage, unspecified: Secondary | ICD-10-CM

## 2017-09-23 DIAGNOSIS — I5042 Chronic combined systolic (congestive) and diastolic (congestive) heart failure: Secondary | ICD-10-CM

## 2017-09-23 DIAGNOSIS — K921 Melena: Secondary | ICD-10-CM | POA: Diagnosis not present

## 2017-09-23 DIAGNOSIS — E1121 Type 2 diabetes mellitus with diabetic nephropathy: Secondary | ICD-10-CM | POA: Diagnosis not present

## 2017-09-23 DIAGNOSIS — I428 Other cardiomyopathies: Secondary | ICD-10-CM

## 2017-09-23 DIAGNOSIS — I480 Paroxysmal atrial fibrillation: Secondary | ICD-10-CM | POA: Diagnosis not present

## 2017-09-23 DIAGNOSIS — E440 Moderate protein-calorie malnutrition: Secondary | ICD-10-CM | POA: Diagnosis not present

## 2017-09-23 LAB — CBC
HCT: 33.6 % — ABNORMAL LOW (ref 36.0–46.0)
HCT: 33.3 % — ABNORMAL LOW (ref 36.0–46.0)
Hemoglobin: 10.5 g/dL — ABNORMAL LOW (ref 12.0–15.0)
Hemoglobin: 10.3 g/dL — ABNORMAL LOW (ref 12.0–15.0)
MCH: 26.3 pg (ref 26.0–34.0)
MCH: 26.1 pg (ref 26.0–34.0)
MCHC: 31.3 g/dL (ref 30.0–36.0)
MCHC: 30.9 g/dL (ref 30.0–36.0)
MCV: 84 fL (ref 78.0–100.0)
MCV: 84.5 fL (ref 78.0–100.0)
PLATELETS: 206 10*3/uL (ref 150–400)
Platelets: 201 10*3/uL (ref 150–400)
RBC: 4 MIL/uL (ref 3.87–5.11)
RBC: 3.94 MIL/uL (ref 3.87–5.11)
RDW: 14.7 % (ref 11.5–15.5)
RDW: 14.7 % (ref 11.5–15.5)
WBC: 5.9 10*3/uL (ref 4.0–10.5)
WBC: 5 10*3/uL (ref 4.0–10.5)

## 2017-09-23 LAB — BASIC METABOLIC PANEL
Anion gap: 10 (ref 5–15)
BUN: 13 mg/dL (ref 6–20)
CALCIUM: 8.9 mg/dL (ref 8.9–10.3)
CO2: 27 mmol/L (ref 22–32)
CREATININE: 0.95 mg/dL (ref 0.44–1.00)
Chloride: 103 mmol/L (ref 101–111)
GFR calc Af Amer: >60 mL/min (ref >60)
GFR, EST NON AFRICAN AMERICAN: 58 mL/min — AB (ref >60)
GLUCOSE: 96 mg/dL (ref 65–99)
POTASSIUM: 2.7 mmol/L — AB (ref 3.5–5.1)
Sodium: 140 mmol/L (ref 135–145)

## 2017-09-23 LAB — GLUCOSE, CAPILLARY
GLUCOSE-CAPILLARY: 108 mg/dL — AB (ref 65–99)
GLUCOSE-CAPILLARY: 124 mg/dL — AB (ref 65–99)
GLUCOSE-CAPILLARY: 322 mg/dL — AB (ref 65–99)
Glucose-Capillary: 67 mg/dL (ref 65–99)

## 2017-09-23 MED ORDER — POTASSIUM CHLORIDE 10 MEQ/100ML IV SOLN
10.0000 meq | INTRAVENOUS | Status: AC
  Administered 2017-09-23 (×3): 10 meq via INTRAVENOUS
  Filled 2017-09-23 (×5): qty 100

## 2017-09-23 MED ORDER — DEXTROSE 50 % IV SOLN
INTRAVENOUS | Status: AC
  Administered 2017-09-23: 25 mL
  Filled 2017-09-23: qty 50

## 2017-09-23 MED ORDER — SENNOSIDES-DOCUSATE SODIUM 8.6-50 MG PO TABS
2.0000 | ORAL_TABLET | Freq: Two times a day (BID) | ORAL | Status: DC
  Administered 2017-09-23 – 2017-09-25 (×4): 2 via ORAL
  Filled 2017-09-23 (×4): qty 2

## 2017-09-23 MED ORDER — POTASSIUM CHLORIDE 10 MEQ/100ML IV SOLN
10.0000 meq | INTRAVENOUS | Status: AC
  Administered 2017-09-23 (×2): 10 meq via INTRAVENOUS
  Filled 2017-09-23 (×2): qty 100

## 2017-09-23 MED ORDER — SODIUM CHLORIDE 0.9 % IV SOLN
INTRAVENOUS | Status: DC
  Administered 2017-09-24: 10:00:00 via INTRAVENOUS

## 2017-09-23 NOTE — Progress Notes (Signed)
Patient complain of constipation.  Dr. Cruzita Lederer notified via text page.

## 2017-09-23 NOTE — Progress Notes (Signed)
PROGRESS NOTE  Kimberly Downs PJA:[id number] DOB: 01-30-1946 DOA: 09/22/2017 PCP: [name], DO   LOS: 1 day   Brief Narrative / Interim history: 72 y.o. female with history of CAD status post stenting and cardiomyopathy out of proportion to CAD status post recent defibrillator placement, COPD, polymyalgia rheumatica on prednisone presents to the ER at [facility] with severe abdominal pain.  Patient has had multiple surgical intervention for her abdomen previously and also had GI bleed.  Patient states when she woke up this morning she had a bowel movement which was dark tarry.  Half an hour later she started developing severe epigastric pain radiating to the back.  Assessment & Plan: Principal Problem:   Acute GI bleeding Active Problems:   DM (diabetes mellitus), type 2 with renal complications (HCC)   Coronary artery disease involving native coronary artery of native heart with angina pectoris (HCC)   Chronic combined systolic and diastolic heart failure (HCC)   PAF (paroxysmal atrial fibrillation) (HCC)   NICM (nonischemic cardiomyopathy) ([place])   ICD (implantable cardioverter-defibrillator) in place   Abdominal pain -Concern for PUD, GI consulted, appreciate input.  Plans for an EGD tomorrow pending anesthesia availability  GI bleed -Concern for upper GI bleed in the setting of PUD, she has a history of PUD evidenced by EGD last year, continue Protonix for now -Hemoglobin overall stable however slightly downtrending  History of nonischemic cardiomyopathy status post defibrillator placement /chronic combined systolic and diastolic CHF -She appears euvolemic currently, Lasix on hold due to GI bleed -most recent 2D echo in October 2018 showed an EF of 25-30% -Stop fluids for now, blood pressure is stable, closely monitor volume status  Coronary artery disease status post stenting -No chest pains, continue metoprolol, Plavix on hold since November 2018  History of  paroxysmal A. fib not on anticoagulation due to GI bleed -Currently appears to be in A. fib on the monitor  Polymyalgia rheumatica on steroids -For now keep on IV Solu-Medrol  COPD -No wheezing, this appears stable   DVT prophylaxis: SCDs Code Status: Full code Family Communication: no family at bedside  Disposition Plan: home when ready   Consultants:   GI  Procedures:   None   Antimicrobials:  None    Subjective: - no chest pain, shortness of breath, slight abdominal pain, no nausea or vomiting.   Objective: Vitals:   09/23/17 0140 09/23/17 0524 09/23/17 0621 09/23/17 0859  BP:  (!) 174/58    Pulse:      Resp:      Temp:  (!) 97 F (36.1 C)    TempSrc:  Axillary    SpO2: 97% 100%  94%  Weight:   47.9 kg (105 lb 9.6 oz)   Height:        Intake/Output Summary (Last 24 hours) at 09/23/2017 1120 Last data filed at 09/23/2017 0855 Gross per 24 hour  Intake 0 ml  Output 450 ml  Net -450 ml   Filed Weights   09/22/17 1033 09/22/17 2056 09/23/17 0621  Weight: 45.4 kg (100 lb) 47.9 kg (105 lb 9.6 oz) 47.9 kg (105 lb 9.6 oz)    Examination:  Constitutional: NAD Eyes: lids and conjunctivae normal ENMT: Mucous membranes are moist. Respiratory: clear to auscultation bilaterally, no wheezing, no crackles..  Cardiovascular: irregular, no murmurs. No LE edema. 2+ pedal pulses.  Abdomen: no tenderness. Bowel sounds positive.  Skin: no rashes Neurologic: non focal    Data Reviewed: I have independently  reviewed following labs and imaging studies   CBC: Recent Labs  Lab 09/22/17 1058 09/22/17 2258 09/23/17 0223 09/23/17 0632  WBC 8.0 5.8 5.9 5.0  NEUTROABS 7.0  --   --   --   HGB 11.2* 10.5* 10.5* 10.3*  HCT 35.6* 33.4* 33.6* 33.3*  MCV 83.8 83.5 84.0 84.5  PLT 238 205 206 601   Basic Metabolic Panel: Recent Labs  Lab 09/22/17 1058 09/23/17 0223  NA 139 140  K 3.2* 2.7*  CL 100* 103  CO2 29 27  GLUCOSE 166* 96  BUN 19 13  CREATININE 1.16*  0.95  CALCIUM 9.4 8.9   GFR: Estimated Creatinine Clearance: 39.5 mL/min (by C-G formula based on SCr of 0.95 mg/dL). Liver Function Tests: Recent Labs  Lab 09/22/17 1058  AST 27  ALT 16  ALKPHOS 120  BILITOT 0.8  PROT 6.9  ALBUMIN 4.0   No results for input(s): LIPASE, AMYLASE in the last 168 hours. No results for input(s): AMMONIA in the last 168 hours. Coagulation Profile: Recent Labs  Lab 09/22/17 1058  INR 0.94   Cardiac Enzymes: No results for input(s): CKTOTAL, CKMB, CKMBINDEX, TROPONINI in the last 168 hours. BNP (last 3 results) No results for input(s): PROBNP in the last 8760 hours. HbA1C: No results for input(s): HGBA1C in the last 72 hours. CBG: Recent Labs  Lab 09/23/17 0811 09/23/17 0933  GLUCAP 67 124*   Lipid Profile: No results for input(s): CHOL, HDL, LDLCALC, TRIG, CHOLHDL, LDLDIRECT in the last 72 hours. Thyroid Function Tests: No results for input(s): TSH, T4TOTAL, FREET4, T3FREE, THYROIDAB in the last 72 hours. Anemia Panel: No results for input(s): VITAMINB12, FOLATE, FERRITIN, TIBC, IRON, RETICCTPCT in the last 72 hours. Urine analysis:    Component Value Date/Time   COLORURINE YELLOW 09/22/2017 1200   APPEARANCEUR CLEAR 09/22/2017 1200   LABSPEC 1.015 09/22/2017 1200   PHURINE 6.5 09/22/2017 1200   GLUCOSEU NEGATIVE 09/22/2017 1200   HGBUR NEGATIVE 09/22/2017 1200   BILIRUBINUR NEGATIVE 09/22/2017 1200   KETONESUR NEGATIVE 09/22/2017 1200   PROTEINUR NEGATIVE 09/22/2017 1200   UROBILINOGEN 0.2 05/25/2015 1400   NITRITE NEGATIVE 09/22/2017 1200   LEUKOCYTESUR NEGATIVE 09/22/2017 1200   Sepsis Labs: Invalid input(s): PROCALCITONIN, LACTICIDVEN  No results found for this or any previous visit (from the past 240 hour(s)).    Radiology Studies: Ct Abdomen Pelvis W Contrast  Result Date: 09/22/2017 CLINICAL DATA:  Abdominal infection and pain. Black stool. Nausea and vomiting. EXAM: CT ABDOMEN AND PELVIS WITH CONTRAST TECHNIQUE:  Multidetector CT imaging of the abdomen and pelvis was performed using the standard protocol following bolus administration of intravenous contrast. CONTRAST:  135mL ISOVUE-300 IOPAMIDOL (ISOVUE-300) INJECTION 61% COMPARISON:  01/12/2017 FINDINGS: Lower chest: Atherosclerotic calcification of the coronaries. Partially visualized pacer into the right ventricle. No acute finding. Hepatobiliary: 4 enhancing masses in the liver consistent with hemangiomata, stable. These are marked on series 2. 2 are seen in the left liver, 1 in the central liver, and the largest in the right liver at 4 cm. These hemangiomas show typical peripheral nodular discontinuous enhancement. Cholecystectomy with chronic common bile duct and intrahepatic dilatation, likely reservoir effect. Normal biliary labs today. Pancreas: Unremarkable. Spleen: Unremarkable. Adrenals/Urinary Tract: Negative adrenals. No hydronephrosis or stone. Unremarkable bladder. Stomach/Bowel: Bowel sutures at the rectum, presumed sigmoidectomy. History of appendectomy. No inflammation or obstruction noted. Vascular/Lymphatic: No acute vascular abnormality. Extensive atherosclerotic calcification of the aorta and iliacs. No mass or adenopathy. Reproductive:Hysterectomy. Other: No ascites or pneumoperitoneum. Musculoskeletal: Sclerosis  across the S3 body and right sacral ala consistent with remote insufficiency fracture. Osteopenia. IMPRESSION: 1. No acute finding. 2. Multiple hepatic hemangiomas. 3.  Aortic Atherosclerosis (ICD10-I70.0).  Coronary atherosclerosis. Electronically Signed   By: Monte Fantasia M.D.   On: 09/22/2017 13:32     Scheduled Meds: . atorvastatin  10 mg Oral QHS  . ipratropium-albuterol  3 mL Nebulization TID  . irbesartan  75 mg Oral Daily  . isosorbide mononitrate  30 mg Oral Daily  . magnesium oxide  200 mg Oral BID  . methylPREDNISolone (SOLU-MEDROL) injection  20 mg Intravenous Daily  . metoprolol tartrate  12.5 mg Oral BID  .  mometasone-formoterol  2 puff Inhalation BID  . [START ON 09/26/2017] pantoprazole  40 mg Intravenous Q12H  . rOPINIRole  2 mg Oral QHS   Continuous Infusions: . sodium chloride 75 mL/hr at 09/22/17 2356  . pantoprozole (PROTONIX) infusion 8 mg/hr (09/22/17 2356)     Kimberly Board, MD, PhD [facility] Pager [phone]  If 7PM-7AM, please contact night-coverage [email or web address] Password [facility] 09/23/2017, 11:20 AM

## 2017-09-23 NOTE — Progress Notes (Signed)
Initial Nutrition Assessment  DOCUMENTATION CODES:   Non-severe (moderate) malnutrition in context of chronic illness  INTERVENTION:   - Will order Glucerna Shake po TID once diet is advanced. Each supplement provides 220 kcal and 10 grams of protein. - Encourage PO intake.  NUTRITION DIAGNOSIS:   Moderate Malnutrition related to chronic illness(CHF, COPD) as evidenced by mild fat depletion, moderate muscle depletion, percent weight loss.  GOAL:   Patient will meet greater than or equal to 90% of their needs  MONITOR:   PO intake, Diet advancement, Weight trends, Labs  REASON FOR ASSESSMENT:   Malnutrition Screening Tool   ASSESSMENT:   72 year old female with PMH significant for CHF, COPD, gastric ulcers, CAD, DM, hyperlipidemia, HTN,  who presented to ED for epigastric pain, weight loss, and intermittent melena.  Pt awaiting EGD on 09/24/17 pending anesthesia availability.  Spoke with pt at bedside who reports a good appetite PTA but difficulty pushing herself to eat more than a few bites. Per MD note, pt has experienced early satiety for approximately 4 weeks. Pt explains that after she eats 3 bites, she cannot make herself swallow more and has to spit out the rest. Pt is usually able to consume a few tablespoons of food at one time and eats at least 5-6 times daily. Pt states that she does not have pain upon swallowing or difficulty swallowing and that it is more of a "mental thing."  Pt's friend expressed concern that pt was "making myself anorexic and not eating on purpose." Pt wondered if that was possible but does not believe that to be the case.  Pt reports that liquids are easier to consume. Pt was drinking coffee, tea, and protein shakes (protein powder, milk, ice, and frozen fruit) PTA. Pt agreeable to receiving Glucerna oral nutrition supplement once diet is advanced.  Pt with completed clear liquid lunch tray at time of visit. Pt requested another popsicle. RD  checked with RN and brought pt a popsicle.  Pt reports not having a UBW. States that her highest weight was 160 lbs but that she has been "up and down." Per weight history in chart, pt has lost 11 lbs in 2 months (9.5% weight loss, significant for timeframe).  Medications reviewed and include: 200 mg magnesium oxide BID, 40 mg Protonix BID, Senokot  Labs reviewed: potassium 2.7 (L) CBG's: 67, 124 this AM  NUTRITION - FOCUSED PHYSICAL EXAM:    Most Recent Value  Orbital Region  Mild depletion  Upper Arm Region  Mild depletion  Thoracic and Lumbar Region  Mild depletion  Buccal Region  No depletion  Temple Region  Mild depletion  Clavicle Bone Region  Mild depletion  Clavicle and Acromion Bone Region  Mild depletion  Scapular Bone Region  Mild depletion  Dorsal Hand  Mild depletion  Patellar Region  Moderate depletion  Anterior Thigh Region  Moderate depletion  Posterior Calf Region  Severe depletion  Edema (RD Assessment)  None  Hair  Reviewed  Eyes  Reviewed  Mouth  Reviewed  Skin  Reviewed  Nails  Reviewed       Diet Order:  Diet clear liquid Room service appropriate? Yes; Fluid consistency: Thin Diet NPO time specified  EDUCATION NEEDS:   No education needs have been identified at this time  Skin:  Skin Assessment: Reviewed RN Assessment  Last BM:  09/22/17  Height:   Ht Readings from Last 1 Encounters:  09/22/17 5' 0.5" (1.537 m)    Weight:   Wt  Readings from Last 1 Encounters:  09/23/17 105 lb 9.6 oz (47.9 kg)    Ideal Body Weight:     BMI:  Body mass index is 20.28 kg/m.  Estimated Nutritional Needs:   Kcal:  1300-1500 kcal/day  Protein:  60-70 grams/day  Fluid:  >1.5 L/day    Gaynell Face, MS, RD, LDN Pager: [phone] Weekend/After Hours: [phone]

## 2017-09-23 NOTE — Care Management Note (Signed)
Case Management Note  Patient Details  Name: JEMILA CAMILLE MRN: [id number] Date of Birth: October 01, 1945  Subjective/Objective:                  Abd. Pain and hypoxia  Action/Plan: Date: September 23, 2017 Velva Harman, BSN, [place], [place] Chart and notes review for patient progress and needs. Will follow for case management and discharge needs. No cm or discharge needs present at time of this review. Next review date: [id number] Expected Discharge Date:                  Expected Discharge Plan:  Home/Self Care  In-House Referral:     Discharge planning Services  CM Consult  Post Acute Care Choice:    Choice offered to:     DME Arranged:    DME Agency:     HH Arranged:    HH Agency:     Status of Service:  In process, will continue to follow  If discussed at Long Length of Stay Meetings, dates discussed:    Additional Comments:  Leeroy Cha, RN 09/23/2017, 8:06 AM
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
## 2017-09-23 NOTE — Progress Notes (Signed)
Hypoglycemic Event  CBG: 67  Treatment: 1/2 AMP DEXTROSE IV  Symptoms: NONE  Follow-up CBG: Time: 0933 CBG Result: 124  Possible Reasons for Event: NPO  Comments/MD notified: yes    Lezlie Octave

## 2017-09-23 NOTE — Progress Notes (Signed)
CRITICAL VALUE ALERT  Critical Value: K+ = 2.7  Date & Time Notied: 09/23/17 @ 0400  Provider Notified: Amion text msg sent to oncall triad hospitalist  Orders Received/Actions taken: pending

## 2017-09-24 ENCOUNTER — Inpatient Hospital Stay (HOSPITAL_COMMUNITY): Payer: Medicare Other | Admitting: Certified Registered Nurse Anesthetist

## 2017-09-24 ENCOUNTER — Encounter (HOSPITAL_COMMUNITY): Payer: Self-pay | Admitting: *Deleted

## 2017-09-24 ENCOUNTER — Encounter (HOSPITAL_COMMUNITY)
Admission: EM | Disposition: A | Discharge status: Home Health | Payer: Self-pay | Source: Home / Self Care | Attending: Internal Medicine

## 2017-09-24 DIAGNOSIS — I429 Cardiomyopathy, unspecified: Secondary | ICD-10-CM | POA: Diagnosis not present

## 2017-09-24 DIAGNOSIS — I5042 Chronic combined systolic (congestive) and diastolic (congestive) heart failure: Secondary | ICD-10-CM | POA: Diagnosis not present

## 2017-09-24 DIAGNOSIS — E440 Moderate protein-calorie malnutrition: Secondary | ICD-10-CM

## 2017-09-24 DIAGNOSIS — K921 Melena: Secondary | ICD-10-CM | POA: Diagnosis not present

## 2017-09-24 DIAGNOSIS — K922 Gastrointestinal hemorrhage, unspecified: Secondary | ICD-10-CM | POA: Diagnosis not present

## 2017-09-24 DIAGNOSIS — E1121 Type 2 diabetes mellitus with diabetic nephropathy: Secondary | ICD-10-CM | POA: Diagnosis not present

## 2017-09-24 DIAGNOSIS — D62 Acute posthemorrhagic anemia: Secondary | ICD-10-CM | POA: Diagnosis not present

## 2017-09-24 DIAGNOSIS — I25119 Atherosclerotic heart disease of native coronary artery with unspecified angina pectoris: Secondary | ICD-10-CM | POA: Diagnosis not present

## 2017-09-24 HISTORY — PX: ESOPHAGOGASTRODUODENOSCOPY (EGD) WITH PROPOFOL: SHX5813

## 2017-09-24 LAB — CBC
HCT: 31.8 % — ABNORMAL LOW (ref 36.0–46.0)
Hemoglobin: 10 g/dL — ABNORMAL LOW (ref 12.0–15.0)
MCH: 26.5 pg (ref 26.0–34.0)
MCHC: 31.4 g/dL (ref 30.0–36.0)
MCV: 84.4 fL (ref 78.0–100.0)
PLATELETS: 224 10*3/uL (ref 150–400)
RBC: 3.77 MIL/uL — AB (ref 3.87–5.11)
RDW: 14.5 % (ref 11.5–15.5)
WBC: 8.7 10*3/uL (ref 4.0–10.5)

## 2017-09-24 LAB — BASIC METABOLIC PANEL
Anion gap: 5 (ref 5–15)
BUN: 8 mg/dL (ref 6–20)
CO2: 25 mmol/L (ref 22–32)
CREATININE: 0.93 mg/dL (ref 0.44–1.00)
Calcium: 8.7 mg/dL — ABNORMAL LOW (ref 8.9–10.3)
Chloride: 106 mmol/L (ref 101–111)
GFR calc Af Amer: >60 mL/min (ref >60)
GFR, EST NON AFRICAN AMERICAN: 60 mL/min — AB (ref >60)
GLUCOSE: 103 mg/dL — AB (ref 65–99)
POTASSIUM: 3.9 mmol/L (ref 3.5–5.1)
Sodium: 136 mmol/L (ref 135–145)

## 2017-09-24 LAB — GLUCOSE, CAPILLARY
GLUCOSE-CAPILLARY: 120 mg/dL — AB (ref 65–99)
Glucose-Capillary: 92 mg/dL (ref 65–99)
Glucose-Capillary: 83 mg/dL (ref 65–99)

## 2017-09-24 SURGERY — ESOPHAGOGASTRODUODENOSCOPY (EGD) WITH PROPOFOL
Anesthesia: Monitor Anesthesia Care | Laterality: Left

## 2017-09-24 MED ORDER — PROPOFOL 500 MG/50ML IV EMUL
INTRAVENOUS | Status: DC | PRN
  Administered 2017-09-24: 100 ug/kg/min via INTRAVENOUS

## 2017-09-24 MED ORDER — FUROSEMIDE 20 MG PO TABS
20.0000 mg | ORAL_TABLET | Freq: Every day | ORAL | Status: DC
  Administered 2017-09-24 – 2017-09-25 (×2): 20 mg via ORAL
  Filled 2017-09-24 (×2): qty 1

## 2017-09-24 MED ORDER — PROPOFOL 10 MG/ML IV BOLUS
INTRAVENOUS | Status: AC
  Filled 2017-09-24: qty 40

## 2017-09-24 MED ORDER — IPRATROPIUM-ALBUTEROL 0.5-2.5 (3) MG/3ML IN SOLN
3.0000 mL | Freq: Three times a day (TID) | RESPIRATORY_TRACT | Status: DC
  Administered 2017-09-24 – 2017-09-25 (×4): 3 mL via RESPIRATORY_TRACT
  Filled 2017-09-24 (×4): qty 3

## 2017-09-24 MED ORDER — PANTOPRAZOLE SODIUM 40 MG PO TBEC
40.0000 mg | DELAYED_RELEASE_TABLET | Freq: Every day | ORAL | Status: DC
  Administered 2017-09-24 – 2017-09-25 (×2): 40 mg via ORAL
  Filled 2017-09-24 (×2): qty 1

## 2017-09-24 MED ORDER — PREDNISONE 10 MG PO TABS
10.0000 mg | ORAL_TABLET | Freq: Every day | ORAL | Status: DC
  Administered 2017-09-25: 10 mg via ORAL
  Filled 2017-09-24: qty 1

## 2017-09-24 MED ORDER — PHENYLEPHRINE 40 MCG/ML (10ML) SYRINGE FOR IV PUSH (FOR BLOOD PRESSURE SUPPORT)
PREFILLED_SYRINGE | INTRAVENOUS | Status: DC | PRN
  Administered 2017-09-24: 80 ug via INTRAVENOUS

## 2017-09-24 MED ORDER — MECLIZINE HCL 25 MG PO TABS
12.5000 mg | ORAL_TABLET | Freq: Three times a day (TID) | ORAL | Status: DC | PRN
  Administered 2017-09-24: 12.5 mg via ORAL
  Filled 2017-09-24: qty 1

## 2017-09-24 MED ORDER — PROPOFOL 10 MG/ML IV BOLUS
INTRAVENOUS | Status: DC | PRN
  Administered 2017-09-24 (×2): 20 mg via INTRAVENOUS

## 2017-09-24 MED ORDER — CYCLOBENZAPRINE HCL 10 MG PO TABS
10.0000 mg | ORAL_TABLET | Freq: Three times a day (TID) | ORAL | Status: DC | PRN
  Administered 2017-09-24: 10 mg via ORAL
  Filled 2017-09-24: qty 1

## 2017-09-24 MED ORDER — BACLOFEN 10 MG PO TABS
10.0000 mg | ORAL_TABLET | Freq: Two times a day (BID) | ORAL | Status: DC | PRN
  Administered 2017-09-25: 10 mg via ORAL
  Filled 2017-09-24: qty 1

## 2017-09-24 MED ORDER — OXYCODONE HCL 5 MG PO TABS
5.0000 mg | ORAL_TABLET | ORAL | Status: DC | PRN
  Administered 2017-09-24 – 2017-09-25 (×5): 5 mg via ORAL
  Filled 2017-09-24 (×6): qty 1

## 2017-09-24 SURGICAL SUPPLY — 14 items

## 2017-09-24 NOTE — Progress Notes (Signed)
PROGRESS NOTE  Kimberly Downs KCL:[id number] DOB: 12-04-1945 DOA: 09/22/2017 PCP: [name], DO   LOS: 2 days   Brief Narrative / Interim history: 72 y.o. female with history of CAD status post stenting and cardiomyopathy out of proportion to CAD status post recent defibrillator placement, COPD, polymyalgia rheumatica on prednisone presents to the ER at [facility] with severe abdominal pain.  Patient has had multiple surgical intervention for her abdomen previously and also had GI bleed.  Patient states when she woke up this morning she had a bowel movement which was dark tarry.  Half an hour later she started developing severe epigastric pain radiating to the back.  Assessment & Plan: Principal Problem:   Acute GI bleeding Active Problems:   DM (diabetes mellitus), type 2 with renal complications (HCC)   Coronary artery disease involving native coronary artery of native heart with angina pectoris (HCC)   Chronic combined systolic and diastolic heart failure (HCC)   PAF (paroxysmal atrial fibrillation) (HCC)   NICM (nonischemic cardiomyopathy) ([place])   ICD (implantable cardioverter-defibrillator) in place   Malnutrition of moderate degree   Abdominal pain -Concern for PUD, GI consulted -patient underwent an EGD today which was unremarkable -? MSK, with negative CT and negative EGD, continue supportive treatment   GI bleed -no bleeding on EGD, stop protonix. D/w Dr. Cristina Gong today  -melena resolving, continue to monitor CBC  Dysphagia -workup per GI, plan for possible upper series  Dizziness -ongoing for at least 1 year, she was extensively evaluated as an outpatient with mixed results, BPPV vs others. She has seen neurology, PT. MRI brain negative. She is scheduled to see ENT in February -start prn Meclizine, PT consult  History of nonischemic cardiomyopathy status post defibrillator placement /chronic combined systolic and diastolic CHF -She appears euvolemic  currently, Lasix on hold due to GI bleed -most recent 2D echo in October 2018 showed an EF of 25-30% -resume Lasix today   Coronary artery disease status post stenting -No chest pains, continue metoprolol, Plavix on hold since November 2018  History of paroxysmal A. fib not on anticoagulation due to GI bleed -Currently appears to be in A. fib on the monitor  Polymyalgia rheumatica on steroids -resume prednisone today   COPD -No wheezing, this appears stable   DVT prophylaxis: SCDs Code Status: Full code Family Communication: no family at bedside  Disposition Plan: home when ready   Consultants:   GI  Procedures:  EGD 1/26  Impression:               - Normal larynx.                           - Normal esophagus.                           - Erythematous mucosa in the antrum. Biopsied.                           - Normal examined duodenum. Biopsied.                           - No cause for dysphagia or weight loss seen. No  source of melena seen.     Antimicrobials:  None    Subjective: -ongoing left sided abdominal pain, no nausea/vomiting.   Objective: Vitals:   09/24/17 0937 09/24/17 0938 09/24/17 1015 09/24/17 1020  BP: 123/67 (!) 158/113 (!) 129/43 (!) 133/46  Pulse: (!) 54 66 63 (!) 55  Resp: 18 15 20 14   Temp: 98.3 F (36.8 C) 98 F (36.7 C) 98.4 F (36.9 C)   TempSrc: Oral Oral Oral   SpO2: 98% 100% 100% 98%  Weight:      Height:        Intake/Output Summary (Last 24 hours) at 09/24/2017 1328 Last data filed at 09/24/2017 1135 Gross per 24 hour  Intake 912.67 ml  Output 1550 ml  Net -637.33 ml   Filed Weights   09/22/17 1033 09/22/17 2056 09/23/17 0621  Weight: 45.4 kg (100 lb) 47.9 kg (105 lb 9.6 oz) 47.9 kg (105 lb 9.6 oz)    Examination:  Constitutional: NAD Eyes: no scleral icterus ENMT: MMm Respiratory: CTA biL, no wheezing, no crackles  Cardiovascular: irregular, no murmurs, no edema.  Abdomen: soft,  slight tenderness left upper quadrant, no guarding, no rebound  Skin: no rashes Neurologic: non focal    Data Reviewed: I have independently reviewed following labs and imaging studies   CBC: Recent Labs  Lab 09/22/17 1058 09/22/17 2258 09/23/17 0223 09/23/17 0632 09/24/17 0607  WBC 8.0 5.8 5.9 5.0 8.7  NEUTROABS 7.0  --   --   --   --   HGB 11.2* 10.5* 10.5* 10.3* 10.0*  HCT 35.6* 33.4* 33.6* 33.3* 31.8*  MCV 83.8 83.5 84.0 84.5 84.4  PLT 238 205 206 201 341   Basic Metabolic Panel: Recent Labs  Lab 09/22/17 1058 09/23/17 0223 09/24/17 0607  NA 139 140 136  K 3.2* 2.7* 3.9  CL 100* 103 106  CO2 29 27 25   GLUCOSE 166* 96 103*  BUN 19 13 8   CREATININE 1.16* 0.95 0.93  CALCIUM 9.4 8.9 8.7*   GFR: Estimated Creatinine Clearance: 40.3 mL/min (by C-G formula based on SCr of 0.93 mg/dL). Liver Function Tests: Recent Labs  Lab 09/22/17 1058  AST 27  ALT 16  ALKPHOS 120  BILITOT 0.8  PROT 6.9  ALBUMIN 4.0   No results for input(s): LIPASE, AMYLASE in the last 168 hours. No results for input(s): AMMONIA in the last 168 hours. Coagulation Profile: Recent Labs  Lab 09/22/17 1058  INR 0.94   Cardiac Enzymes: No results for input(s): CKTOTAL, CKMB, CKMBINDEX, TROPONINI in the last 168 hours. BNP (last 3 results) No results for input(s): PROBNP in the last 8760 hours. HbA1C: No results for input(s): HGBA1C in the last 72 hours. CBG: Recent Labs  Lab 09/23/17 0811 09/23/17 0933 09/23/17 1654 09/23/17 2340 09/24/17 0754  GLUCAP 67 124* 322* 108* 92   Lipid Profile: No results for input(s): CHOL, HDL, LDLCALC, TRIG, CHOLHDL, LDLDIRECT in the last 72 hours. Thyroid Function Tests: No results for input(s): TSH, T4TOTAL, FREET4, T3FREE, THYROIDAB in the last 72 hours. Anemia Panel: No results for input(s): VITAMINB12, FOLATE, FERRITIN, TIBC, IRON, RETICCTPCT in the last 72 hours. Urine analysis:    Component Value Date/Time   COLORURINE YELLOW 09/22/2017  1200   APPEARANCEUR CLEAR 09/22/2017 1200   LABSPEC 1.015 09/22/2017 1200   PHURINE 6.5 09/22/2017 1200   GLUCOSEU NEGATIVE 09/22/2017 1200   HGBUR NEGATIVE 09/22/2017 1200   BILIRUBINUR NEGATIVE 09/22/2017 1200   KETONESUR NEGATIVE 09/22/2017 1200   PROTEINUR NEGATIVE  09/22/2017 1200   UROBILINOGEN 0.2 05/25/2015 1400   NITRITE NEGATIVE 09/22/2017 1200   LEUKOCYTESUR NEGATIVE 09/22/2017 1200   Sepsis Labs: Invalid input(s): PROCALCITONIN, LACTICIDVEN  No results found for this or any previous visit (from the past 240 hour(s)).    Radiology Studies: No results found.   Scheduled Meds: . atorvastatin  10 mg Oral QHS  . furosemide  20 mg Oral Daily  . ipratropium-albuterol  3 mL Nebulization TID  . irbesartan  75 mg Oral Daily  . isosorbide mononitrate  30 mg Oral Daily  . magnesium oxide  200 mg Oral BID  . metoprolol tartrate  12.5 mg Oral BID  . mometasone-formoterol  2 puff Inhalation BID  . pantoprazole  40 mg Oral Daily  . [START ON 09/25/2017] predniSONE  10 mg Oral Q breakfast  . rOPINIRole  2 mg Oral QHS  . senna-docusate  2 tablet Oral BID   Continuous Infusions:    Marzetta Board, MD, PhD [facility] Pager [phone]  If 7PM-7AM, please contact night-coverage [email or web address] Password [email or web address] 09/24/2017, 1:28 PM

## 2017-09-24 NOTE — Anesthesia Preprocedure Evaluation (Signed)
Anesthesia Evaluation  Patient identified by MRN, date of birth, ID band Patient awake    Reviewed: Allergy & Precautions, NPO status , Patient's Chart, lab work & pertinent test results, reviewed documented beta blocker date and time   Airway Mallampati: II  TM Distance: >3 FB     Dental  (+) Edentulous Upper, Edentulous Lower   Pulmonary Current Smoker, former smoker,    breath sounds clear to auscultation       Cardiovascular hypertension, Pt. on home beta blockers and Pt. on medications + CAD  + Cardiac Defibrillator  Rhythm:Regular Rate:Normal     Neuro/Psych Anxiety Depression    GI/Hepatic   Endo/Other  diabetes  Renal/GU      Musculoskeletal   Abdominal   Peds  Hematology  (+) anemia ,   Anesthesia Other Findings   Reproductive/Obstetrics                             Anesthesia Physical  Anesthesia Plan  ASA: III  Anesthesia Plan: MAC   Post-op Pain Management:    Induction: Intravenous  PONV Risk Score and Plan: 2 and Ondansetron, Treatment may vary due to age and Midazolam  Airway Management Planned: Nasal Cannula  Additional Equipment:   Intra-op Plan:   Post-operative Plan:   Informed Consent: I have reviewed the patients History and Physical, chart, labs and discussed the procedure including the risks, benefits and alternatives for the proposed anesthesia with the patient or authorized representative who has indicated his/her understanding and acceptance.     Plan Discussed with: CRNA and Anesthesiologist  Anesthesia Plan Comments:         Anesthesia Quick Evaluation

## 2017-09-24 NOTE — Progress Notes (Signed)
Patient's EGD was well tolerated, but unrevealing for a source of reported melenic stool or, for that matter, her inability to swallow.  Recommendations:  1.  I have stopped her Protonix infusion and started her on once daily oral Protonix for GI prophylaxis, keeping in mind the gastric erosions noted on her endoscopy back in June.  2.  I have started her on a heart healthy diet.  3.  Monitor hemoglobin.  Realistically, this medically tenuous patient is not a candidate for colonoscopy, in my opinion.  Since her upper endoscopy was unrevealing, and a capsule endoscopy would not likely lead to any change in management (for example, even if it showed AVMs or other lesions), I do not think that further workup for her melena is needed.  I do not see any endoscopic contraindication to use of antiplatelet agents, including aspirin, if it was felt to be important for this patient, although it is my understanding that her Plavix was stopped last November when her ICD was placed.  4.  Concerning her dysphagia and weight loss, I would consider obtaining an upper GI series with a barium tablet on Monday to assess function and motility of the esophagus and stomach.  Cleotis Nipper, M.D. Pager [phone] If no answer or after 5 PM call [phone]

## 2017-09-24 NOTE — Interval H&P Note (Signed)
History and Physical Interval Note:  09/24/2017 9:54 AM  Kimberly Downs  has presented today for surgery, with the diagnosis of melena, anemia, weight loss, early satiety  The various methods of treatment have been discussed with the patient and family. After consideration of risks, benefits and other options for treatment, the patient has consented to  Procedure(s): ESOPHAGOGASTRODUODENOSCOPY (EGD) WITH PROPOFOL (Left) as a surgical intervention .  The patient's history has been reviewed, patient examined, no change in status, stable for surgery.  I have reviewed the patient's chart and labs.  Questions were answered to the patient's satisfaction.     Cleotis Nipper

## 2017-09-24 NOTE — Transfer of Care (Signed)
Immediate Anesthesia Transfer of Care Note  Patient: Kimberly Downs  Procedure(s) Performed: ESOPHAGOGASTRODUODENOSCOPY (EGD) WITH PROPOFOL (Left )  Patient Location: PACU and Endoscopy Unit  Anesthesia Type:MAC  Level of Consciousness: awake, alert , oriented and patient cooperative  Airway & Oxygen Therapy: Patient Spontanous Breathing and Patient connected to face mask  Post-op Assessment: Report given to RN and Post -op Vital signs reviewed and stable  Post vital signs: Reviewed and stable  Last Vitals:  Vitals:   09/24/17 0750 09/24/17 0938  BP:  (!) 158/113  Pulse:  66  Resp:  15  Temp:  36.7 C  SpO2: 98% 100%    Last Pain:  Vitals:   09/24/17 0938  TempSrc: Oral  PainSc:       Patients Stated Pain Goal: 0 (33/00/76 2263)  Complications: No apparent anesthesia complications

## 2017-09-24 NOTE — Anesthesia Postprocedure Evaluation (Signed)
Anesthesia Post Note  Patient: TOMA ERICHSEN  Procedure(s) Performed: ESOPHAGOGASTRODUODENOSCOPY (EGD) WITH PROPOFOL (Left )     Patient location during evaluation: PACU Anesthesia Type: MAC Level of consciousness: awake and alert Pain management: pain level controlled Vital Signs Assessment: post-procedure vital signs reviewed and stable Respiratory status: spontaneous breathing, nonlabored ventilation and respiratory function stable Cardiovascular status: stable and blood pressure returned to baseline Postop Assessment: no apparent nausea or vomiting Anesthetic complications: no    Last Vitals:  Vitals:   09/24/17 1015 09/24/17 1020  BP: (!) 129/43 (!) 133/46  Pulse: 63 (!) 55  Resp: 20 14  Temp: 36.9 C   SpO2: 100% 98%    Last Pain:  Vitals:   09/24/17 1015  TempSrc: Oral  PainSc:                  Lynda Rainwater

## 2017-09-24 NOTE — Op Note (Signed)
[facility] Patient Name: Kimberly Downs Procedure Date: 09/24/2017 MRN: [id number] Attending MD: Ronald Lobo , MD Date of Birth: 1946-02-23 CSN: [id number] Age: 72 Admit Type: Inpatient Procedure:                Upper GI endoscopy Indications:              Dysphagia, Melena, Weight loss in a patient with EF                            < 25% and severe COPD Providers:                Ronald Lobo, MD, Elna Breslow, RN, William Dalton, Technician Referring MD:              Medicines:                Monitored Anesthesia Care Complications:            No immediate complications. Estimated Blood Loss:     Estimated blood loss was minimal. Procedure:                Pre-Anesthesia Assessment:                           - Prior to the procedure, a History and Physical                            was performed, and patient medications and                            allergies were reviewed. The patient's tolerance of                            previous anesthesia was also reviewed. The risks                            and benefits of the procedure and the sedation                            options and risks were discussed with the patient.                            All questions were answered, and informed consent                            was obtained. Prior Anticoagulants: The patient has                            taken no previous anticoagulant or antiplatelet                            agents. ASA Grade Assessment: IV - A patient with  severe systemic disease that is a constant threat                            to life. After reviewing the risks and benefits,                            the patient was deemed in satisfactory condition to                            undergo the procedure.                           After obtaining informed consent, the endoscope was                            passed under direct  vision. Throughout the                            procedure, the patient's blood pressure, pulse, and                            oxygen saturations were monitored continuously. The                            [id number] ([id number]) scope was introduced through the                            mouth, and advanced to the third part of duodenum.                            The upper GI endoscopy was accomplished without                            difficulty. The patient tolerated the procedure                            well. Findings:      The larynx was normal.      The examined esophagus was normal. No stricture present.      Diffuse moderately erythematous mucosa without bleeding was found in the       gastric antrum. Biopsies were taken with a cold forceps for histology.       Estimated blood loss was minimal.      The cardia and gastric fundus were normal on retroflexion.      The examined duodenum was normal. Biopsies were taken with a cold       forceps for histology. Impression:               - Normal larynx.                           - Normal esophagus.                           - Erythematous mucosa in the antrum. Biopsied.                           -  Normal examined duodenum. Biopsied.                           - No cause for dysphagia or weight loss seen. No                            source of melena seen. Moderate Sedation:      This patient was sedated with monitored anesthesia care, not moderate       sedation. Recommendation:           - Await pathology results.                           - Continue present medications.                           - Consider upper GI series to assess upper                            intestinal function and motility.                           - Resume regular diet today. Procedure Code(s):        --- Professional ---                           [phone], Esophagogastroduodenoscopy, flexible,                            transoral; with biopsy, single or  multiple Diagnosis Code(s):        --- Professional ---                           R13.10, Dysphagia, unspecified                           K92.1, Melena (includes Hematochezia)                           R63.4, Abnormal weight loss CPT copyright 2016 American Medical Association. All rights reserved. The codes documented in this report are preliminary and upon coder review may  be revised to meet current compliance requirements. Ronald Lobo, MD 09/24/2017 10:15:11 AM This report has been signed electronically. Number of Addenda: 0

## 2017-09-24 NOTE — Anesthesia Procedure Notes (Signed)
Date/Time: 09/24/2017 9:55 AM Performed by: Claudia Desanctis, CRNA Oxygen Delivery Method: Nasal cannula

## 2017-09-25 DIAGNOSIS — Z95810 Presence of automatic (implantable) cardiac defibrillator: Secondary | ICD-10-CM | POA: Diagnosis not present

## 2017-09-25 DIAGNOSIS — E1121 Type 2 diabetes mellitus with diabetic nephropathy: Secondary | ICD-10-CM | POA: Diagnosis not present

## 2017-09-25 DIAGNOSIS — K921 Melena: Secondary | ICD-10-CM | POA: Diagnosis not present

## 2017-09-25 DIAGNOSIS — E440 Moderate protein-calorie malnutrition: Secondary | ICD-10-CM

## 2017-09-25 DIAGNOSIS — I5042 Chronic combined systolic (congestive) and diastolic (congestive) heart failure: Secondary | ICD-10-CM | POA: Diagnosis not present

## 2017-09-25 DIAGNOSIS — I480 Paroxysmal atrial fibrillation: Secondary | ICD-10-CM

## 2017-09-25 DIAGNOSIS — I25119 Atherosclerotic heart disease of native coronary artery with unspecified angina pectoris: Secondary | ICD-10-CM | POA: Diagnosis not present

## 2017-09-25 LAB — CBC
HCT: 33.9 % — ABNORMAL LOW (ref 36.0–46.0)
Hemoglobin: 10.6 g/dL — ABNORMAL LOW (ref 12.0–15.0)
MCH: 26.4 pg (ref 26.0–34.0)
MCHC: 31.3 g/dL (ref 30.0–36.0)
MCV: 84.5 fL (ref 78.0–100.0)
PLATELETS: 227 10*3/uL (ref 150–400)
RBC: 4.01 MIL/uL (ref 3.87–5.11)
RDW: 14.7 % (ref 11.5–15.5)
WBC: 7.7 10*3/uL (ref 4.0–10.5)

## 2017-09-25 LAB — GLUCOSE, CAPILLARY: GLUCOSE-CAPILLARY: 128 mg/dL — AB (ref 65–99)

## 2017-09-25 LAB — BASIC METABOLIC PANEL
Anion gap: 4 — ABNORMAL LOW (ref 5–15)
BUN: 7 mg/dL (ref 6–20)
CALCIUM: 8.6 mg/dL — AB (ref 8.9–10.3)
CHLORIDE: 106 mmol/L (ref 101–111)
CO2: 29 mmol/L (ref 22–32)
CREATININE: 0.95 mg/dL (ref 0.44–1.00)
GFR calc non Af Amer: 58 mL/min — ABNORMAL LOW (ref >60)
Glucose, Bld: 95 mg/dL (ref 65–99)
Potassium: 4 mmol/L (ref 3.5–5.1)
SODIUM: 139 mmol/L (ref 135–145)

## 2017-09-25 MED ORDER — IPRATROPIUM-ALBUTEROL 0.5-2.5 (3) MG/3ML IN SOLN: 3.0000 mL | Freq: Two times a day (BID) | RESPIRATORY_TRACT | Status: DC

## 2017-09-25 MED ORDER — OXYCODONE HCL 5 MG PO TABS: 5.0000 mg | ORAL_TABLET | ORAL | 0 refills | Status: DC | PRN

## 2017-09-25 MED ORDER — MECLIZINE HCL 12.5 MG PO TABS: 12.5000 mg | ORAL_TABLET | Freq: Three times a day (TID) | ORAL | 0 refills | Status: DC | PRN

## 2017-09-25 NOTE — Progress Notes (Signed)
Spoke to pt and offered choice. Pt requested [facility]. Requested RW with seat. States her RW with seat is broken. Jonnie Finner RN CCM Case Mgmt phone [phone]

## 2017-09-25 NOTE — Discharge Summary (Addendum)
Physician Discharge Summary  ALYSHIA KERNAN BZJ:[id number] DOB: Aug 20, 1946 DOA: 09/22/2017  PCP: [name], DO  Admit date: 09/22/2017 Discharge date: 09/25/2017  Admitted From: home Disposition:  home  Recommendations for Outpatient Follow-up:  1. Follow up with Dr. Veva Holes in 2-3 weeks 2. Follow up with ENT as scheduled  Home Health: PT Equipment/Devices: walker  Discharge Condition: stable CODE STATUS: Full code Diet recommendation: heart healthy  HPI: Per Dr. Glyn Ade, Kimberly Downs is a 72 y.o. female with history of CAD status post stenting and cardiomyopathy out of proportion to CAD status post recent defibrillator placement, COPD, polymyalgia rheumatica on prednisone presents to the ER at [facility] with severe abdominal pain.  Patient has had multiple surgical intervention for her abdomen previously and also had GI bleed.  Patient states when she woke up this morning she had a bowel movement which was dark tarry.  Half an hour later she started developing severe epigastric pain radiating to the back. ED Course: In the ER CT abdomen and pelvis was unremarkable fecal occult blood was negative.  Given the history it was highly concerning for GI bleed and patient is being admitted for further management.  Denies any chest pain or shortness of breath.  Hospital Course: Abdominal pain -patient was admitted to the hospital for sudden onset of abdominal pain radiating to her back and melanotic stool.  There were initial concerns for peptic ulcer disease and gastroenterology was consulted.  Patient underwent an EGD on 1/26 without significant findings.  Initial CT scan of the abdomen and pelvis was also without acute findings to explain her pain.  This is improved with conservative management.  She did complain of intermittent dysphagia, EGD unrevealing, and this was resolved and she is able to tolerate a regular diet.  She will need to follow-up with primary GI as an  outpatient for further workup if dysphagia recurs. GI bleed -Concern for upper GI bleed in the setting of PUD, she has a history of PUD evidenced by EGD last year, however EGD at this time was unremarkable.  Hemoglobin has remained stable. Dizziness -patient with chronic dizziness for several years, no clear cause, will try as needed Antivert.  She has been seen by neurology in the past.  She has a follow-up appointment with ENT in about 3-4 weeks, advised her to keep this appointment History of nonischemic cardiomyopathy status post defibrillator placement /chronic combined systolic and diastolic CHF -She appears euvolemic currently, resume home medications on discharge, most recent 2D echo in October 2018 showed an EF of 25-30%. She has been off of her plavix since her ICD placement, will need to discuss with cards when to resume. Coronary artery disease status post stenting -No chest pains, continue home medications History of paroxysmal A. fib not on anticoagulation due to GI bleed -stable Polymyalgia rheumatica on steroids -resume home medications COPD -No wheezing, this appears stable    Discharge Diagnoses:  Principal Problem:   Acute GI bleeding Active Problems:   DM (diabetes mellitus), type 2 with renal complications (HCC)   Coronary artery disease involving native coronary artery of native heart with angina pectoris (HCC)   Chronic combined systolic and diastolic heart failure (HCC)   PAF (paroxysmal atrial fibrillation) (HCC)   NICM (nonischemic cardiomyopathy) ([place])   ICD (implantable cardioverter-defibrillator) in place   Malnutrition of moderate degree     Discharge Instructions   Allergies as of 09/25/2017      Reactions  Gabapentin Other (See Comments)   Passed out.   Lyrica [pregabalin] Other (See Comments)   "almost died" confusion, syncope, hypertension   Dicyclomine Other (See Comments)   Reaction:  Agitation    Morphine And Related Nausea And Vomiting    Nsaids Nausea And Vomiting   Toradol [ketorolac Tromethamine] Nausea And Vomiting   Levaquin [levofloxacin] Nausea And Vomiting, Rash   Metronidazole Nausea And Vomiting, Rash      Medication List    STOP taking these medications   traMADol 50 MG tablet Commonly known as:  ULTRAM     TAKE these medications   acetaminophen-codeine 300-30 MG tablet Commonly known as:  TYLENOL #3 Take 1 tablet by mouth every 8 (eight) hours as needed.   atorvastatin 10 MG tablet Commonly known as:  LIPITOR Take 10 mg by mouth at bedtime.   baclofen 10 MG tablet Commonly known as:  LIORESAL Take 10 mg by mouth 2 (two) times daily as needed.   budesonide-formoterol 160-4.5 MCG/ACT inhaler Commonly known as:  SYMBICORT Inhale 2 puffs into the lungs 2 (two) times daily.   clopidogrel 75 MG tablet Commonly known as:  PLAVIX Take 75 mg by mouth daily.   cyanocobalamin 2000 MCG tablet Commonly known as:  [employer] VITAMIN B12 Take 1 tablet (2,000 mcg total) by mouth daily.   cyclobenzaprine 10 MG tablet Commonly known as:  FLEXERIL Take 10 mg by mouth 3 (three) times daily as needed for muscle spasms.   furosemide 20 MG tablet Commonly known as:  LASIX Take 1 tablet (20 mg total) by mouth daily.   ipratropium-albuterol 0.5-2.5 (3) MG/3ML Soln Commonly known as:  DUONEB Take 3 mLs by nebulization 3 (three) times daily. DX: J44.9   irbesartan 75 MG tablet Commonly known as:  AVAPRO Take 1 tablet (75 mg total) by mouth daily.   isosorbide mononitrate 30 MG 24 hr tablet Commonly known as:  IMDUR TAKE 1 TABLET (30 MG TOTAL) BY MOUTH DAILY.   Magnesium 500 MG Tabs Take 250 mg by mouth 2 (two) times daily.   meclizine 12.5 MG tablet Commonly known as:  ANTIVERT Take 1 tablet (12.5 mg total) by mouth 3 (three) times daily as needed for dizziness.   metoprolol tartrate 25 MG tablet Commonly known as:  LOPRESSOR Take 1/4 tablet by mouth twice daily. What changed:    how much to take  how  to take this  when to take this  additional instructions   oxyCODONE 5 MG immediate release tablet Commonly known as:  Oxy IR/ROXICODONE Take 1 tablet (5 mg total) by mouth every 4 (four) hours as needed for severe pain.   pantoprazole 40 MG tablet Commonly known as:  PROTONIX Take 40 mg by mouth daily as needed (for acid reflux).   pentoxifylline 400 MG CR tablet Commonly known as:  TRENTAL Take 400 mg by mouth daily.   predniSONE 10 MG tablet Commonly known as:  DELTASONE Take 10 mg by mouth daily with breakfast.   rOPINIRole 2 MG tablet Commonly known as:  REQUIP Take 2 mg by mouth at bedtime.      Follow-up Information    [name], MD. Schedule an appointment as soon as possible for a visit in 3 week(s).   Specialty:  Gastroenterology Contact information: [address] [place] [address] [phone]           Consultations:  GI  Procedures/Studies:  EGD  Ct Head Wo Contrast  Result Date: 09/10/2017 CLINICAL  DATA:  Dizziness.  Head and facial injury in November 2018. EXAM: CT HEAD WITHOUT CONTRAST TECHNIQUE: Contiguous axial images were obtained from the base of the skull through the vertex without intravenous contrast. COMPARISON:  07/30/2017. FINDINGS: Brain: Diffusely enlarged ventricles and subarachnoid spaces. Patchy white matter low density in both cerebral hemispheres. No intracranial hemorrhage, mass lesion or CT evidence of acute infarction. Vascular: No hyperdense vessel or unexpected calcification. Skull: Normal. Negative for fracture or focal lesion. Sinuses/Orbits: Unremarkable. Other: None. IMPRESSION: 1. No acute abnormality. 2. Stable mild diffuse cerebral and cerebellar atrophy. 3. Stable chronic small vessel white matter ischemic changes in both cerebral hemispheres. Electronically Signed   By: Claudie Revering M.D.   On: 09/10/2017 20:24   Ct Abdomen Pelvis W Contrast  Result Date: 09/22/2017 CLINICAL DATA:  Abdominal  infection and pain. Black stool. Nausea and vomiting. EXAM: CT ABDOMEN AND PELVIS WITH CONTRAST TECHNIQUE: Multidetector CT imaging of the abdomen and pelvis was performed using the standard protocol following bolus administration of intravenous contrast. CONTRAST:  127mL ISOVUE-300 IOPAMIDOL (ISOVUE-300) INJECTION 61% COMPARISON:  01/12/2017 FINDINGS: Lower chest: Atherosclerotic calcification of the coronaries. Partially visualized pacer into the right ventricle. No acute finding. Hepatobiliary: 4 enhancing masses in the liver consistent with hemangiomata, stable. These are marked on series 2. 2 are seen in the left liver, 1 in the central liver, and the largest in the right liver at 4 cm. These hemangiomas show typical peripheral nodular discontinuous enhancement. Cholecystectomy with chronic common bile duct and intrahepatic dilatation, likely reservoir effect. Normal biliary labs today. Pancreas: Unremarkable. Spleen: Unremarkable. Adrenals/Urinary Tract: Negative adrenals. No hydronephrosis or stone. Unremarkable bladder. Stomach/Bowel: Bowel sutures at the rectum, presumed sigmoidectomy. History of appendectomy. No inflammation or obstruction noted. Vascular/Lymphatic: No acute vascular abnormality. Extensive atherosclerotic calcification of the aorta and iliacs. No mass or adenopathy. Reproductive:Hysterectomy. Other: No ascites or pneumoperitoneum. Musculoskeletal: Sclerosis across the S3 body and right sacral ala consistent with remote insufficiency fracture. Osteopenia. IMPRESSION: 1. No acute finding. 2. Multiple hepatic hemangiomas. 3.  Aortic Atherosclerosis (ICD10-I70.0).  Coronary atherosclerosis. Electronically Signed   By: Monte Fantasia M.D.   On: 09/22/2017 13:32     Subjective: - no chest pain, shortness of breath, no abdominal pain, nausea or vomiting.   Discharge Exam: Vitals:   09/25/17 0811 09/25/17 0918  BP:    Pulse:  70  Resp:    Temp:    SpO2: 97%     General: Pt is  alert, awake, not in acute distress Cardiovascular: RRR, S1/S2 +, no rubs, no gallops Respiratory: CTA bilaterally, no wheezing, no rhonchi Abdominal: Soft, NT, ND, bowel sounds + Extremities: no edema, no cyanosis    The results of significant diagnostics from this hospitalization (including imaging, microbiology, ancillary and laboratory) are listed below for reference.     Microbiology: No results found for this or any previous visit (from the past 240 hour(s)).   Labs: BNP (last 3 results) Recent Labs    03/15/17 1717  BNP 6,759.1*   Basic Metabolic Panel: Recent Labs  Lab 09/22/17 1058 09/23/17 0223 09/24/17 0607 09/25/17 0546  NA 139 140 136 139  K 3.2* 2.7* 3.9 4.0  CL 100* 103 106 106  CO2 29 27 25 29   GLUCOSE 166* 96 103* 95  BUN 19 13 8 7   CREATININE 1.16* 0.95 0.93 0.95  CALCIUM 9.4 8.9 8.7* 8.6*   Liver Function Tests: Recent Labs  Lab 09/22/17 1058  AST 27  ALT 16  ALKPHOS 120  BILITOT 0.8  PROT 6.9  ALBUMIN 4.0   No results for input(s): LIPASE, AMYLASE in the last 168 hours. No results for input(s): AMMONIA in the last 168 hours. CBC: Recent Labs  Lab 09/22/17 1058 09/22/17 2258 09/23/17 0223 09/23/17 0632 09/24/17 0607 09/25/17 0546  WBC 8.0 5.8 5.9 5.0 8.7 7.7  NEUTROABS 7.0  --   --   --   --   --   HGB 11.2* 10.5* 10.5* 10.3* 10.0* 10.6*  HCT 35.6* 33.4* 33.6* 33.3* 31.8* 33.9*  MCV 83.8 83.5 84.0 84.5 84.4 84.5  PLT 238 205 206 201 224 227   Cardiac Enzymes: No results for input(s): CKTOTAL, CKMB, CKMBINDEX, TROPONINI in the last 168 hours. BNP: Invalid input(s): POCBNP CBG: Recent Labs  Lab 09/23/17 2340 09/24/17 0754 09/24/17 1600 09/24/17 2352 09/25/17 0800  GLUCAP 108* 92 83 120* 128*   D-Dimer No results for input(s): DDIMER in the last 72 hours. Hgb A1c No results for input(s): HGBA1C in the last 72 hours. Lipid Profile No results for input(s): CHOL, HDL, LDLCALC, TRIG, CHOLHDL, LDLDIRECT in the last 72  hours. Thyroid function studies No results for input(s): TSH, T4TOTAL, T3FREE, THYROIDAB in the last 72 hours.  Invalid input(s): FREET3 Anemia work up No results for input(s): VITAMINB12, FOLATE, FERRITIN, TIBC, IRON, RETICCTPCT in the last 72 hours. Urinalysis    Component Value Date/Time   COLORURINE YELLOW 09/22/2017 1200   APPEARANCEUR CLEAR 09/22/2017 1200   LABSPEC 1.015 09/22/2017 1200   PHURINE 6.5 09/22/2017 1200   GLUCOSEU NEGATIVE 09/22/2017 1200   HGBUR NEGATIVE 09/22/2017 1200   BILIRUBINUR NEGATIVE 09/22/2017 1200   KETONESUR NEGATIVE 09/22/2017 1200   PROTEINUR NEGATIVE 09/22/2017 1200   UROBILINOGEN 0.2 05/25/2015 1400   NITRITE NEGATIVE 09/22/2017 1200   LEUKOCYTESUR NEGATIVE 09/22/2017 1200   Sepsis Labs Invalid input(s): PROCALCITONIN,  WBC,  LACTICIDVEN   Time coordinating discharge: 40 minutes  SIGNED:  Marzetta Board, MD  [facility] 09/25/2017, 12:56 PM Pager [phone]  If 7PM-7AM, please contact night-coverage [email or web address] Password [email or web address]

## 2017-09-25 NOTE — Progress Notes (Signed)
Patient looks really good today.  She states she has eaten more today than she is eaten in the past several weeks.  Hemoglobin stable.  Discussed with Dr. Cruzita Lederer.  Agree with plans for discharge.  Have sent a message to Dr. Therisa Doyne of our practice to contact patient in the near future and arrange follow-up sometime in the next several weeks.  Have also asked the patient to call our office if she does not hear from [place] within several days.    (Parameters for follow-up include monitoring hemoglobin and Hemoccult status, as well as food tolerance, and assessing whether nausea, vomiting, or dysphagia are occurring with sufficient severity to justify doing a barium swallow or upper GI series.)  Please call if I can be of further assistance with this patient.  Cleotis Nipper, M.D. Pager [phone] If no answer or after 5 PM call [phone]

## 2017-09-25 NOTE — Care Management (Signed)
[place] Referral called to [place] [facility].  [facility] aware to deliver RW.

## 2017-09-25 NOTE — Discharge Instructions (Signed)
Follow with Le, Thao P, DO in 2-3 weeks  Please get a complete blood count and chemistry panel checked by your Primary MD at your next visit, and again as instructed by your Primary MD. Please get your medications reviewed and adjusted by your Primary MD.  Please request your Primary MD to go over all Hospital Tests and Procedure/Radiological results at the follow up, please get all Hospital records sent to your Prim MD by signing hospital release before you go home.  If you had Pneumonia of Lung problems at the Hospital: Please get a 2 view Chest X ray done in 6-8 weeks after hospital discharge or sooner if instructed by your Primary MD.  If you have Congestive Heart Failure: Please call your Cardiologist or Primary MD anytime you have any of the following symptoms:  1) 3 pound weight gain in 24 hours or 5 pounds in 1 week  2) shortness of breath, with or without a dry hacking cough  3) swelling in the hands, feet or stomach  4) if you have to sleep on extra pillows at night in order to breathe  Follow cardiac low salt diet and 1.5 lit/day fluid restriction.  If you have diabetes Accuchecks 4 times/day, Once in AM empty stomach and then before each meal. Log in all results and show them to your primary doctor at your next visit. If any glucose reading is under 80 or above 300 call your primary MD immediately.  If you have Seizure/Convulsions/Epilepsy: Please do not drive, operate heavy machinery, participate in activities at heights or participate in high speed sports until you have seen by Primary MD or a Neurologist and advised to do so again.  If you had Gastrointestinal Bleeding: Please ask your Primary MD to check a complete blood count within one week of discharge or at your next visit. Your endoscopic/colonoscopic biopsies that are pending at the time of discharge, will also need to followed by your Primary MD.  Get Medicines reviewed and adjusted. Please take all your  medications with you for your next visit with your Primary MD  Please request your Primary MD to go over all hospital tests and procedure/radiological results at the follow up, please ask your Primary MD to get all Hospital records sent to his/her office.  If you experience worsening of your admission symptoms, develop shortness of breath, life threatening emergency, suicidal or homicidal thoughts you must seek medical attention immediately by calling 911 or calling your MD immediately  if symptoms less severe.  You must read complete instructions/literature along with all the possible adverse reactions/side effects for all the Medicines you take and that have been prescribed to you. Take any new Medicines after you have completely understood and accpet all the possible adverse reactions/side effects.   Do not drive or operate heavy machinery when taking Pain medications.   Do not take more than prescribed Pain, Sleep and Anxiety Medications  Special Instructions: If you have smoked or chewed Tobacco  in the last 2 yrs please stop smoking, stop any regular Alcohol  and or any Recreational drug use.  Wear Seat belts while driving.  Please note You were cared for by a hospitalist during your hospital stay. If you have any questions about your discharge medications or the care you received while you were in the hospital after you are discharged, you can call the unit and asked to speak with the hospitalist on call if the hospitalist that took care of you is not available.  Once you are discharged, your primary care physician will handle any further medical issues. Please note that NO REFILLS for any discharge medications will be authorized once you are discharged, as it is imperative that you return to your primary care physician (or establish a relationship with a primary care physician if you do not have one) for your aftercare needs so that they can reassess your need for medications and monitor your  lab values.  You can reach the hospitalist office at phone [phone] or fax [phone]   If you do not have a primary care physician, you can call [phone] for a physician referral.  Activity: As tolerated with Full fall precautions use walker/cane & assistance as needed  Diet: regular  Disposition Home

## 2017-09-25 NOTE — Evaluation (Addendum)
Physical Therapy Evaluation Patient Details Name: Kimberly Downs MRN: [id number] DOB: 06-01-1946 Today's Date: 09/25/2017   History of Present Illness  72 y.o. female with history of dizziness and multiple falls (has had outpt PT which ruled out BPPV), CAD status post stenting and cardiomyopathy out of proportion to CAD status post recent defibrillator placement, COPD, polymyalgia rheumatica on prednisone presents to the ER at [facility] with severe abdominal pain. Dx of GIB.   Clinical Impression  Pt admitted with above diagnosis. Pt currently with functional limitations due to the deficits listed below (see PT Problem List). Pt ambulated 100' with hand held assist of 2 for balance. Pt reports h/o 5 falls since November 2018 and extensive workup for cause of dizziness has been negative. She will see ENT in February. Recommended pt use rollator at all times, as she reports she's not fallen when using it but doesn't always use it. HHPT also recommended for home safety eval.  Pt will benefit from skilled PT to increase their independence and safety with mobility to allow discharge to the venue listed below.       Follow Up Recommendations Home health PT    Equipment Recommendations  rollator (4 wheeled RW with seat) -pt has one that is broken   Recommendations for Other Services       Precautions / Restrictions Precautions Precautions: Fall Precaution Comments: 5 falls since 06/2017 Restrictions Weight Bearing Restrictions: No      Mobility  Bed Mobility Overal bed mobility: Independent                Transfers Overall transfer level: Needs assistance Equipment used: 1 person hand held assist Transfers: Sit to/from Stand Sit to Stand: Min assist         General transfer comment: min A for balance  Ambulation/Gait Ambulation/Gait assistance: Min assist Ambulation Distance (Feet): 100 Feet Assistive device: 2 person hand held assist Gait  Pattern/deviations: Step-through pattern;Decreased stride length   Gait velocity interpretation: Below normal speed for age/gender General Gait Details: pt declined use of RW (prefers rollator) so used hand held assist of 2 for balance, distance limited by dizziness, several brief standing rest breaks 2* dizziness  Stairs            Wheelchair Mobility    Modified Rankin (Stroke Patients Only)       Balance Overall balance assessment: History of Falls;Needs assistance   Sitting balance-Leahy Scale: Good     Standing balance support: Single extremity supported Standing balance-Leahy Scale: Poor Standing balance comment: requires single UE support                             Pertinent Vitals/Pain Pain Assessment: No/denies pain    Home Living Family/patient expects to be discharged to:: Private residence Living Arrangements: Alone Available Help at Discharge: Neighbor;Available PRN/intermittently Type of Home: Apartment Home Access: Level entry     Home Layout: One level Home Equipment: Walker - 4 wheels      Prior Function Level of Independence: Independent with assistive device(s)         Comments: walks with rollator PRN, independent ADLs, drives, multiple falls 2* dizziness since 06/2017 (outpt PT ruled out BPPV)     Hand Dominance   Dominant Hand: Right    Extremity/Trunk Assessment   Upper Extremity Assessment Upper Extremity Assessment: Overall WFL for tasks assessed    Lower Extremity Assessment Lower Extremity Assessment: Overall  WFL for tasks assessed    Cervical / Trunk Assessment Cervical / Trunk Assessment: Kyphotic  Communication   Communication: No difficulties  Cognition Arousal/Alertness: Awake/alert Behavior During Therapy: WFL for tasks assessed/performed Overall Cognitive Status: Within Functional Limits for tasks assessed                                        General Comments       Exercises     Assessment/Plan    PT Assessment Patient needs continued PT services  PT Problem List Decreased balance;Decreased mobility;Decreased activity tolerance       PT Treatment Interventions Therapeutic exercise;Balance training;Therapeutic activities    PT Goals (Current goals can be found in the Care Plan section)  Acute Rehab PT Goals Patient Stated Goal: figure out what is causing dizziness PT Goal Formulation: With patient Time For Goal Achievement: 10/09/17 Potential to Achieve Goals: Good    Frequency Min 3X/week   Barriers to discharge        Co-evaluation               AM-PAC PT "6 Clicks" Daily Activity  Outcome Measure Difficulty turning over in bed (including adjusting bedclothes, sheets and blankets)?: A Little Difficulty moving from lying on back to sitting on the side of the bed? : A Little Difficulty sitting down on and standing up from a chair with arms (e.g., wheelchair, bedside commode, etc,.)?: A Little Help needed moving to and from a bed to chair (including a wheelchair)?: A Little Help needed walking in hospital room?: A Little Help needed climbing 3-5 steps with a railing? : A Lot 6 Click Score: 17    End of Session Equipment Utilized During Treatment: Gait belt Activity Tolerance: Patient tolerated treatment well Patient left: in chair;with call bell/phone within reach;with chair alarm set Nurse Communication: Mobility status PT Visit Diagnosis: Unsteadiness on feet (R26.81);Difficulty in walking, not elsewhere classified (R26.2);Repeated falls (R29.6)    Time: 8184-0375 PT Time Calculation (min) (ACUTE ONLY): 16 min   Charges:   PT Evaluation $PT Eval Low Complexity: 1 Low     PT G Codes:          Philomena Doheny 09/25/2017, 10:57 AM [phone]

## 2017-09-25 NOTE — Progress Notes (Signed)
Pt unable to wait for rolling walker to be delivered to room since her ride home was waiting for her.  Asked to leave AMA, MD paged and RN told to discharge pt home. Case management called to see how pt can obtain rolling walker, [facility] address given to pt and informed pt to pick it up at earliest convenience.  AVS reviewed at bedside, prescriptions given. No further questions. Kizzie Ide, RN

## 2017-09-26 ENCOUNTER — Encounter (HOSPITAL_COMMUNITY): Payer: Self-pay | Admitting: Gastroenterology

## 2017-11-10 ENCOUNTER — Other Ambulatory Visit: Payer: Self-pay

## 2017-11-10 ENCOUNTER — Emergency Department (HOSPITAL_COMMUNITY): Payer: Medicare Other

## 2017-11-10 ENCOUNTER — Emergency Department (HOSPITAL_COMMUNITY)
Admission: EM | Admit: 2017-11-10 | Discharge: 2017-11-10 | Disposition: A | Discharge status: Home / Self Care | Payer: Medicare Other | Attending: Emergency Medicine | Admitting: Emergency Medicine

## 2017-11-10 DIAGNOSIS — I5042 Chronic combined systolic (congestive) and diastolic (congestive) heart failure: Secondary | ICD-10-CM | POA: Insufficient documentation

## 2017-11-10 DIAGNOSIS — Z7901 Long term (current) use of anticoagulants: Secondary | ICD-10-CM | POA: Insufficient documentation

## 2017-11-10 DIAGNOSIS — R296 Repeated falls: Secondary | ICD-10-CM | POA: Diagnosis not present

## 2017-11-10 DIAGNOSIS — I110 Hypertensive heart disease with heart failure: Secondary | ICD-10-CM | POA: Insufficient documentation

## 2017-11-10 DIAGNOSIS — Z95810 Presence of automatic (implantable) cardiac defibrillator: Secondary | ICD-10-CM | POA: Diagnosis not present

## 2017-11-10 DIAGNOSIS — E119 Type 2 diabetes mellitus without complications: Secondary | ICD-10-CM | POA: Insufficient documentation

## 2017-11-10 DIAGNOSIS — Z79899 Other long term (current) drug therapy: Secondary | ICD-10-CM | POA: Insufficient documentation

## 2017-11-10 DIAGNOSIS — I2510 Atherosclerotic heart disease of native coronary artery without angina pectoris: Secondary | ICD-10-CM | POA: Insufficient documentation

## 2017-11-10 DIAGNOSIS — J449 Chronic obstructive pulmonary disease, unspecified: Secondary | ICD-10-CM | POA: Diagnosis not present

## 2017-11-10 DIAGNOSIS — R531 Weakness: Secondary | ICD-10-CM

## 2017-11-10 DIAGNOSIS — Z7982 Long term (current) use of aspirin: Secondary | ICD-10-CM | POA: Insufficient documentation

## 2017-11-10 DIAGNOSIS — E860 Dehydration: Secondary | ICD-10-CM | POA: Insufficient documentation

## 2017-11-10 LAB — COMPREHENSIVE METABOLIC PANEL
ALBUMIN: 3.5 g/dL (ref 3.5–5.0)
ALK PHOS: 99 U/L (ref 38–126)
ALT: 15 U/L (ref 14–54)
ANION GAP: 14 (ref 5–15)
AST: 26 U/L (ref 15–41)
BILIRUBIN TOTAL: 1.1 mg/dL (ref 0.3–1.2)
BUN: 14 mg/dL (ref 6–20)
CALCIUM: 9.6 mg/dL (ref 8.9–10.3)
CO2: 27 mmol/L (ref 22–32)
Chloride: 100 mmol/L — ABNORMAL LOW (ref 101–111)
Creatinine, Ser: 1.1 mg/dL — ABNORMAL HIGH (ref 0.44–1.00)
GFR, EST AFRICAN AMERICAN: 57 mL/min — AB (ref >60)
GFR, EST NON AFRICAN AMERICAN: 49 mL/min — AB (ref >60)
GLUCOSE: 81 mg/dL (ref 65–99)
Potassium: 3.4 mmol/L — ABNORMAL LOW (ref 3.5–5.1)
Sodium: 141 mmol/L (ref 135–145)
TOTAL PROTEIN: 6.2 g/dL — AB (ref 6.5–8.1)

## 2017-11-10 LAB — CBC WITH DIFFERENTIAL/PLATELET
BASOS ABS: 0 10*3/uL (ref 0.0–0.1)
BASOS PCT: 0 %
Eosinophils Absolute: 0 10*3/uL (ref 0.0–0.7)
Eosinophils Relative: 0 %
HEMATOCRIT: 36.2 % (ref 36.0–46.0)
Hemoglobin: 11.2 g/dL — ABNORMAL LOW (ref 12.0–15.0)
Lymphocytes Relative: 14 %
Lymphs Abs: 1.3 10*3/uL (ref 0.7–4.0)
MCH: 25.6 pg — ABNORMAL LOW (ref 26.0–34.0)
MCHC: 30.9 g/dL (ref 30.0–36.0)
MCV: 82.6 fL (ref 78.0–100.0)
MONO ABS: 0.7 10*3/uL (ref 0.1–1.0)
Monocytes Relative: 8 %
NEUTROS ABS: 6.9 10*3/uL (ref 1.7–7.7)
Neutrophils Relative %: 78 %
PLATELETS: 284 10*3/uL (ref 150–400)
RBC: 4.38 MIL/uL (ref 3.87–5.11)
RDW: 14.9 % (ref 11.5–15.5)
WBC: 8.9 10*3/uL (ref 4.0–10.5)

## 2017-11-10 LAB — I-STAT VENOUS BLOOD GAS, ED
ACID-BASE EXCESS: 7 mmol/L — AB (ref 0.0–2.0)
Bicarbonate: 31.9 mmol/L — ABNORMAL HIGH (ref 20.0–28.0)
O2 Saturation: 60 %
PH VEN: 7.428 (ref 7.250–7.430)
TCO2: 33 mmol/L — ABNORMAL HIGH (ref 22–32)
pCO2, Ven: 48.2 mmHg (ref 44.0–60.0)
pO2, Ven: 31 mmHg — CL (ref 32.0–45.0)

## 2017-11-10 LAB — I-STAT TROPONIN, ED: Troponin i, poc: 0 ng/mL (ref 0.00–0.08)

## 2017-11-10 MED ORDER — SODIUM CHLORIDE 0.9 % IV BOLUS (SEPSIS): 500.0000 mL | Freq: Once | INTRAVENOUS | Status: DC

## 2017-11-10 NOTE — ED Triage Notes (Signed)
Patient c/o weakness and dizziness x 2-3 days. Per EMS patient not taking prescription pain medication but friend at home states differently.
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
## 2017-11-10 NOTE — ED Notes (Signed)
Patient transported to CT 

## 2017-11-16 ENCOUNTER — Encounter: Payer: Self-pay | Admitting: Cardiovascular Disease

## 2017-11-16 ENCOUNTER — Ambulatory Visit: Payer: Medicare Other | Admitting: Cardiovascular Disease

## 2017-11-16 VITALS — BP 140/70 | HR 61 | Ht 64.0 in | Wt 100.0 lb

## 2017-11-16 DIAGNOSIS — I5043 Acute on chronic combined systolic (congestive) and diastolic (congestive) heart failure: Secondary | ICD-10-CM

## 2017-11-16 DIAGNOSIS — I951 Orthostatic hypotension: Secondary | ICD-10-CM

## 2017-11-16 DIAGNOSIS — J449 Chronic obstructive pulmonary disease, unspecified: Secondary | ICD-10-CM | POA: Diagnosis not present

## 2017-11-16 DIAGNOSIS — I25119 Atherosclerotic heart disease of native coronary artery with unspecified angina pectoris: Secondary | ICD-10-CM | POA: Diagnosis not present

## 2017-11-16 DIAGNOSIS — E440 Moderate protein-calorie malnutrition: Secondary | ICD-10-CM | POA: Diagnosis not present

## 2017-11-16 DIAGNOSIS — I4891 Unspecified atrial fibrillation: Secondary | ICD-10-CM

## 2017-11-16 DIAGNOSIS — Z79899 Other long term (current) drug therapy: Secondary | ICD-10-CM

## 2017-11-16 DIAGNOSIS — Z95810 Presence of automatic (implantable) cardiac defibrillator: Secondary | ICD-10-CM

## 2017-11-16 MED ORDER — FUROSEMIDE 40 MG PO TABS: 40.0000 mg | ORAL_TABLET | Freq: Every day | ORAL | 3 refills | Status: DC

## 2017-11-16 NOTE — Patient Instructions (Signed)
Dr Sallyanne Kuster has recommended making the following medication changes: 1. INCREASE Furosemide to 40 mg DAILY  Your physician recommends that you return for lab work [place], 2019.  Your physician recommends that you weigh, daily, at the same time every day, and in the same amount of clothing. Please record your daily weights on the handout provided and bring it to your next appointment.  Your physician recommends that you schedule a follow-up appointment in 2 weeks with a NP/PA  Remote monitoring is used to monitor your Pacemaker or ICD from home. This monitoring reduces the number of officer visits required to check your device to one time per year. It allows [place] to keep an eye on the functioning of your device to ensure it is working properly. You are scheduled for a device check from home on Wednesday, June 19th, 2019. You may send your transmission at any time that day. If you have a wireless device, the transmission will be sent automatically. After your physician reviews your transmission, you will receive a postcard with your next transmission date.  Dr Sallyanne Kuster recommends that you schedule a follow-up appointment in 12 months with an ICD check. You will receive a reminder letter in the mail two months in advance. If you don't receive a letter, please call our office to schedule the follow-up appointment.  If you need a refill on your cardiac medications before your next appointment, please call your pharmacy.
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
## 2017-12-06 ENCOUNTER — Ambulatory Visit: Payer: Medicare Other | Admitting: Adult Health

## 2017-12-06 LAB — BASIC METABOLIC PANEL
BUN/Creatinine Ratio: 20 (ref 12–28)
BUN: 21 mg/dL (ref 8–27)
CALCIUM: 9 mg/dL (ref 8.7–10.3)
CHLORIDE: 101 mmol/L (ref 96–106)
CO2: 27 mmol/L (ref 20–29)
Creatinine, Ser: 1.06 mg/dL — ABNORMAL HIGH (ref 0.57–1.00)
GFR, EST AFRICAN AMERICAN: 60 mL/min/{1.73_m2} (ref >59)
GFR, EST NON AFRICAN AMERICAN: 52 mL/min/{1.73_m2} — AB (ref >59)
Glucose: 224 mg/dL — ABNORMAL HIGH (ref 65–99)
POTASSIUM: 3.6 mmol/L (ref 3.5–5.2)
SODIUM: 142 mmol/L (ref 134–144)

## 2017-12-06 LAB — PRO B NATRIURETIC PEPTIDE: NT-Pro BNP: 1059 pg/mL — ABNORMAL HIGH (ref 0–301)

## 2017-12-06 LAB — MAGNESIUM: Magnesium: 1.9 mg/dL (ref 1.6–2.3)

## 2017-12-08 ENCOUNTER — Ambulatory Visit: Payer: Medicare Other | Admitting: Physician Assistant

## 2017-12-17 IMAGING — CT CT ANGIO CHEST
2 of 7 series · 19 of 46 positions shown · IV contrast (isovue)
Comparison: Chest film from earlier in the same day

CLINICAL DATA: Chest pain and shortness of Breath

EXAM:
CT ANGIOGRAPHY CHEST WITH CONTRAST
TECHNIQUE: Multidetector CT imaging of the chest was performed using the
standard protocol during bolus administration of intravenous
contrast. Multiplanar CT image reconstructions and MIPs were
obtained to evaluate the vascular anatomy.
CONTRAST:  100 mL Isovue 370.

[Series 6: thins · axial · 0.53mm/px · z∈[-263,-33]mm · 16 of 371 slices shown]
[im 21/371  lung]
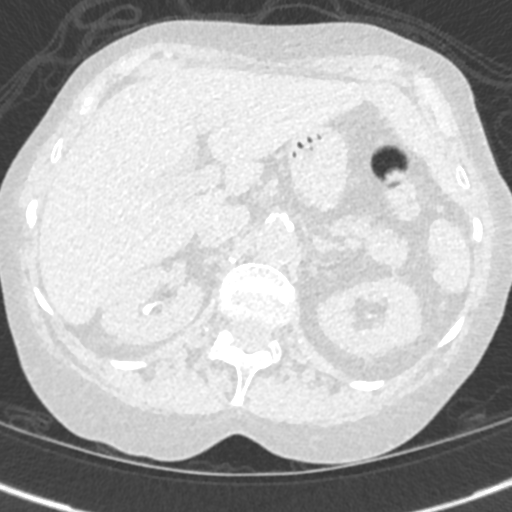
[im 42/371  soft-tissue]
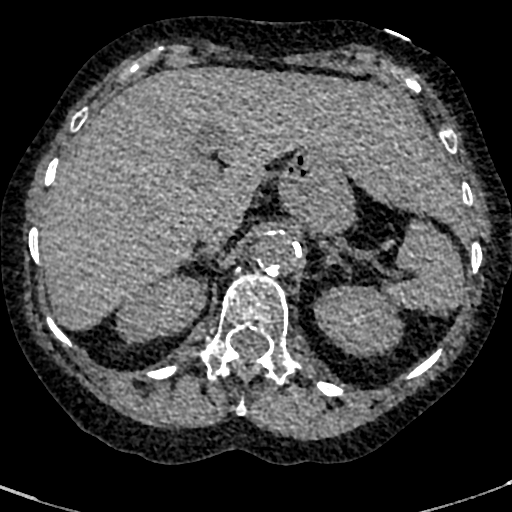
[im 62/371  lung]
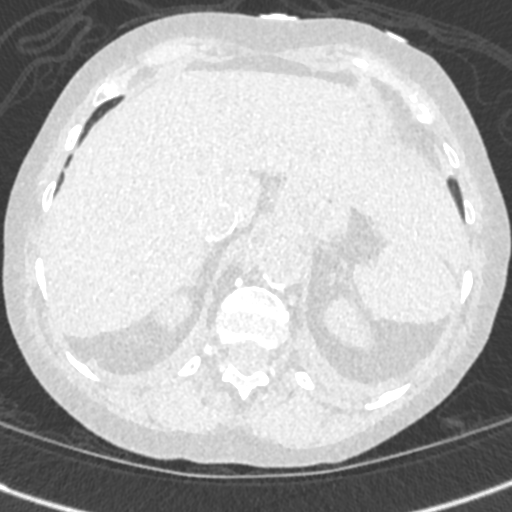
[im 83/371  soft-tissue]
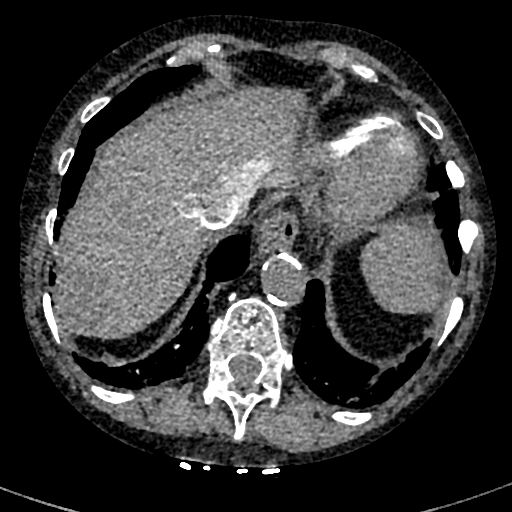
[im 103/371  lung]
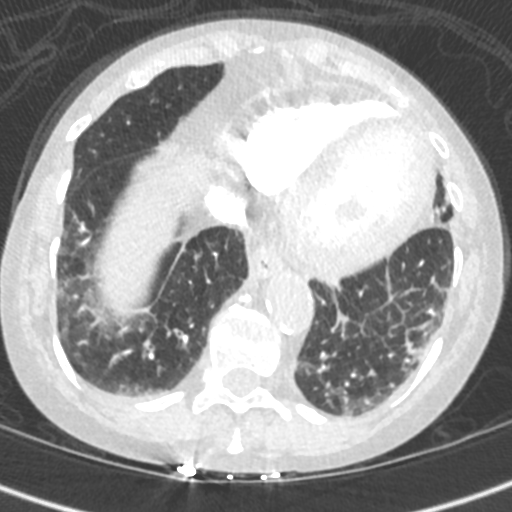
[im 124/371  soft-tissue]
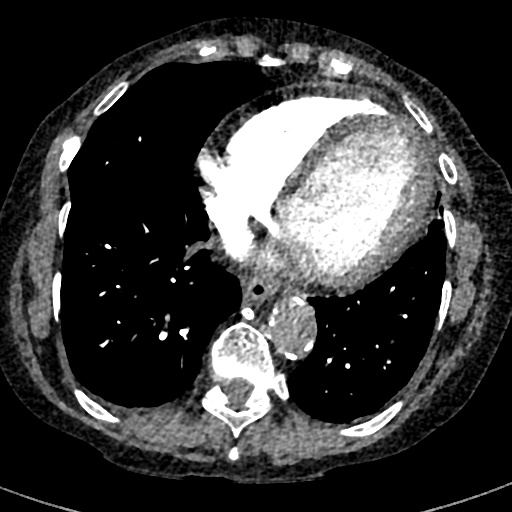
[im 144/371  lung]
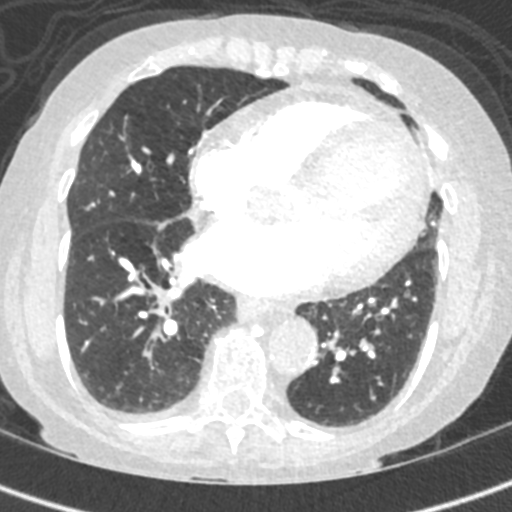
[im 165/371  soft-tissue]
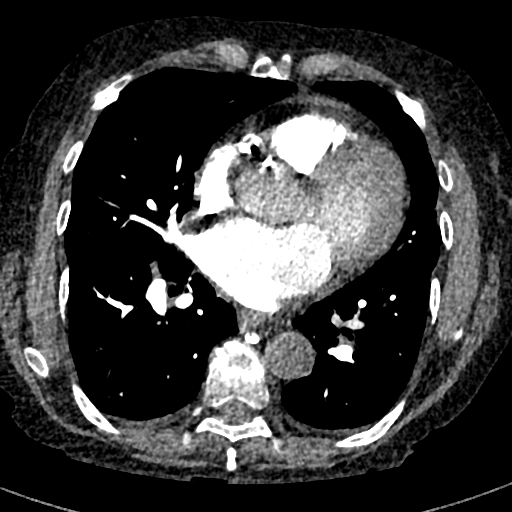
[im 206/371  lung]
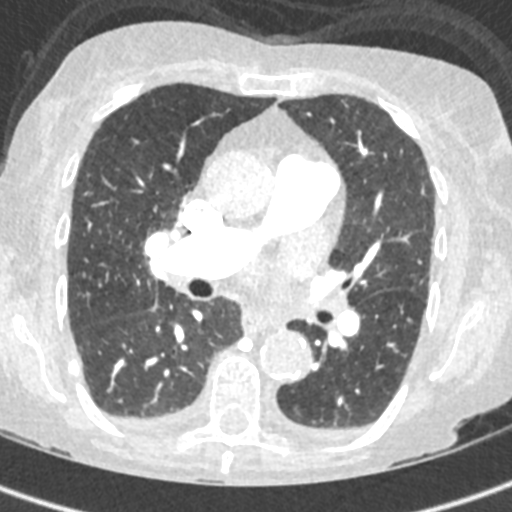
[im 227/371  soft-tissue]
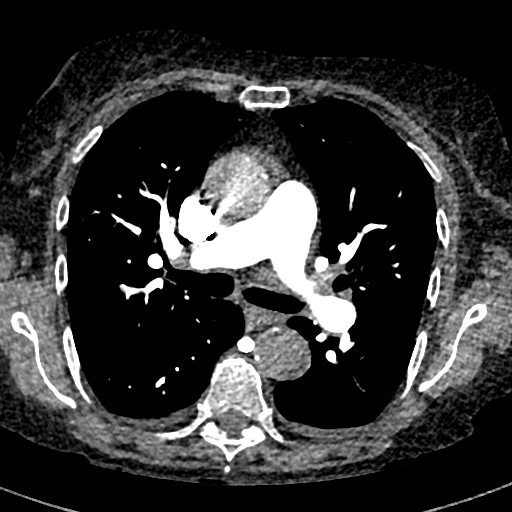
[im 247/371  lung]
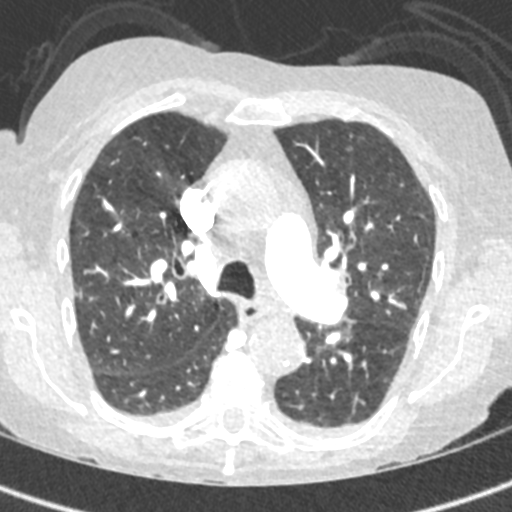
[im 268/371  soft-tissue]
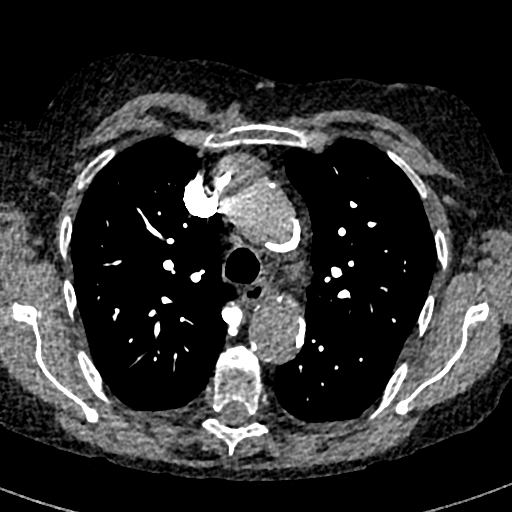
[im 288/371  lung]
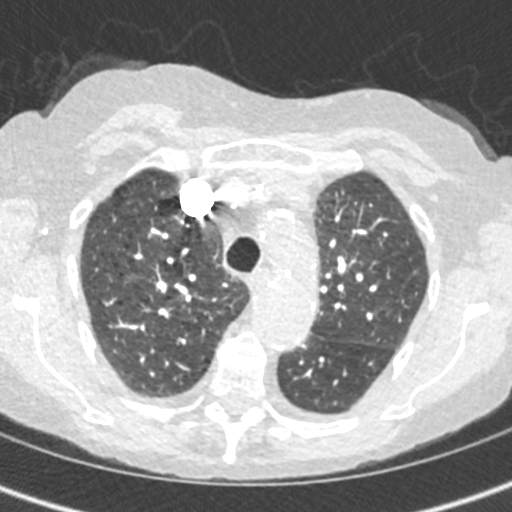
[im 309/371  soft-tissue]
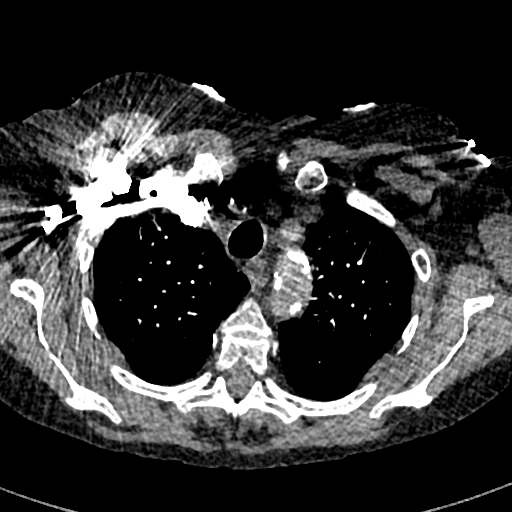
[im 329/371  lung]
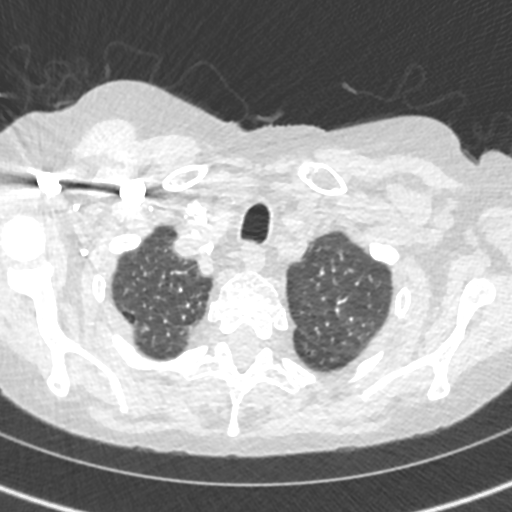
[im 350/371  soft-tissue]
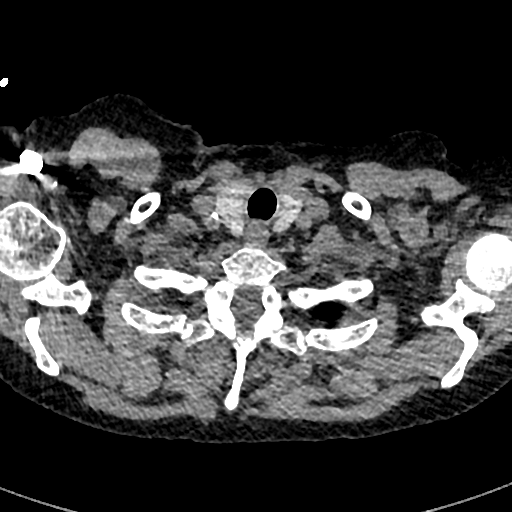

[Series 8: cor · coronal · 0.52mm/px · 3 of 114 slices shown]
[im 29/114  soft-tissue]
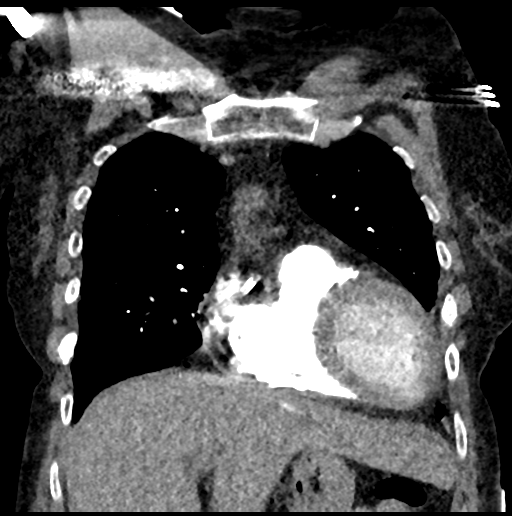
[im 57/114  soft-tissue]
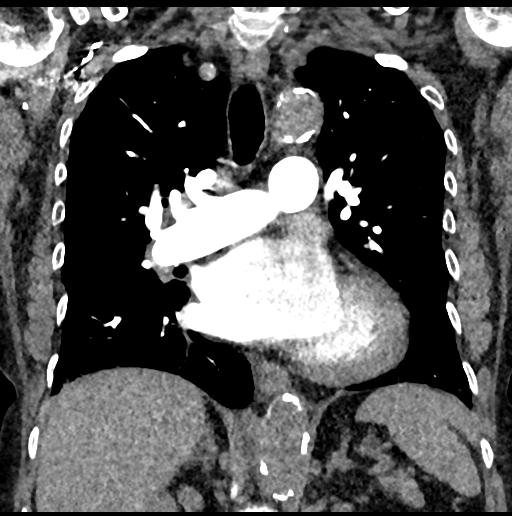
[im 85/114  soft-tissue]
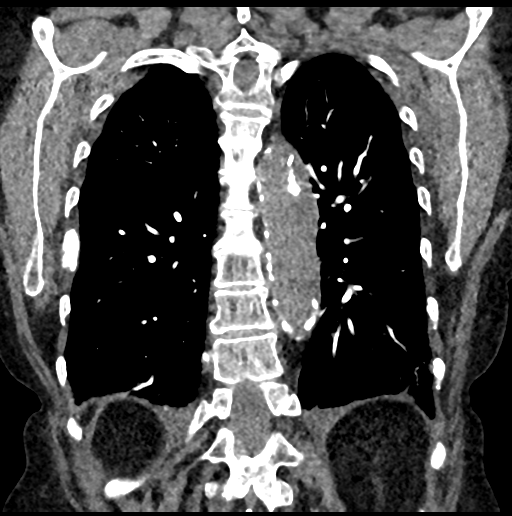

[19 of 46 positions shown; findings below may reference images not displayed]

FINDINGS: Cardiovascular: Diffuse aortic calcifications are noted without
aneurysmal dilatation. Poor opacification of the aorta is noted. No
significant cardiac enlargement is seen. Heavy coronary
calcifications are noted. The pulmonary artery shows a normal
branching pattern without filling defect to suggest pulmonary
embolism.

Mediastinum/Nodes: No significant hilar or mediastinal adenopathy is
identified. The thoracic inlet is within normal limits. The
esophagus is also within normal limits. A small hiatal hernia is
noted.

Lungs/Pleura: Mild bibasilar atelectatic changes are noted. No focal
confluent infiltrate is seen. No focal nodule is seen. No sizable
effusion or pneumothorax is noted. Mild emphysematous changes are
noted.

Upper Abdomen: Within normal limits.

Musculoskeletal: Old bilateral rib fractures are noted with
nonunion.

Review of the MIP images confirms the above findings.
IMPRESSION: No evidence of pulmonary emboli.

Mild bibasilar atelectatic changes.

No acute abnormality is seen.

Aortic Atherosclerosis (VT56X-YAN.N) and Emphysema (VT56X-9I0.A).

## 2017-12-23 ENCOUNTER — Ambulatory Visit: Payer: Medicare Other | Admitting: Physician Assistant

## 2017-12-23 ENCOUNTER — Encounter: Payer: Self-pay | Admitting: Physician Assistant

## 2017-12-23 VITALS — BP 124/72 | HR 61 | Ht 64.0 in | Wt 104.2 lb

## 2017-12-23 DIAGNOSIS — I5042 Chronic combined systolic (congestive) and diastolic (congestive) heart failure: Secondary | ICD-10-CM

## 2017-12-23 DIAGNOSIS — E785 Hyperlipidemia, unspecified: Secondary | ICD-10-CM

## 2017-12-23 DIAGNOSIS — J449 Chronic obstructive pulmonary disease, unspecified: Secondary | ICD-10-CM

## 2017-12-23 DIAGNOSIS — I480 Paroxysmal atrial fibrillation: Secondary | ICD-10-CM | POA: Diagnosis not present

## 2017-12-23 DIAGNOSIS — I10 Essential (primary) hypertension: Secondary | ICD-10-CM | POA: Diagnosis not present

## 2017-12-23 DIAGNOSIS — I25119 Atherosclerotic heart disease of native coronary artery with unspecified angina pectoris: Secondary | ICD-10-CM

## 2017-12-23 DIAGNOSIS — E119 Type 2 diabetes mellitus without complications: Secondary | ICD-10-CM

## 2017-12-23 NOTE — Patient Instructions (Addendum)
Kimberly Downs, [place] has recommended making the following medication changes: 1. USE Tylenol as needed for pain   Your physician recommends that you return for lab work TODAY.  Isaac Laud recommends that you schedule a follow-up appointment in 1 month with Dr Sallyanne Kuster.  If you need a refill on your cardiac medications before your next appointment, please call your pharmacy.
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
## 2017-12-24 LAB — BASIC METABOLIC PANEL
BUN/Creatinine Ratio: 15 (ref 12–28)
BUN: 14 mg/dL (ref 8–27)
CALCIUM: 9.4 mg/dL (ref 8.7–10.3)
CO2: 28 mmol/L (ref 20–29)
CREATININE: 0.95 mg/dL (ref 0.57–1.00)
Chloride: 102 mmol/L (ref 96–106)
GFR, EST AFRICAN AMERICAN: 69 mL/min/{1.73_m2} (ref >59)
GFR, EST NON AFRICAN AMERICAN: 60 mL/min/{1.73_m2} (ref >59)
Glucose: 99 mg/dL (ref 65–99)
POTASSIUM: 3.1 mmol/L — AB (ref 3.5–5.2)
SODIUM: 146 mmol/L — AB (ref 134–144)

## 2017-12-24 LAB — PRO B NATRIURETIC PEPTIDE: NT-Pro BNP: 961 pg/mL — ABNORMAL HIGH (ref 0–301)

## 2017-12-25 ENCOUNTER — Encounter: Payer: Self-pay | Admitting: Physician Assistant

## 2017-12-26 ENCOUNTER — Other Ambulatory Visit: Payer: Self-pay

## 2017-12-26 DIAGNOSIS — I5042 Chronic combined systolic (congestive) and diastolic (congestive) heart failure: Secondary | ICD-10-CM

## 2017-12-26 DIAGNOSIS — I2510 Atherosclerotic heart disease of native coronary artery without angina pectoris: Secondary | ICD-10-CM

## 2017-12-26 NOTE — Progress Notes (Signed)
Potassium low, BNP continue to be high. Recommend add Potassium Chloride to 20 meq BID, and start 60mg  daily of lasix this Thur. Repeat labwork in 1-2 weeks

## 2017-12-28 ENCOUNTER — Telehealth: Payer: Self-pay | Admitting: Physician Assistant

## 2017-12-28 MED ORDER — POTASSIUM CHLORIDE CRYS ER 20 MEQ PO TBCR: 20.0000 meq | EXTENDED_RELEASE_TABLET | Freq: Two times a day (BID) | ORAL | 5 refills | Status: DC

## 2017-12-28 NOTE — Telephone Encounter (Signed)
1 - Advised patient, verbalized understanding.  Patient is currently taking Furosemide 20 mg 2 in the am and 1 in the evening as needed. She has taken 1 extra dose since seeing [employer] PA. She will take her potassium for a couple of days prior to making change in her Furosemide   2 - Patient stated she wanted to know if she needed to continue Plavix and she had discussed with Isaac Laud. Will forward for review   Notes recorded by [name], PA on 12/26/2017 at 10:07 AM EDT Potassium low, BNP continue to be high. Recommend add Potassium Chloride to 20 meq BID, and start 60mg  daily of lasix this Thur. Repeat labwork in 1-2 weeks

## 2017-12-28 NOTE — Telephone Encounter (Signed)
Left message to call back  

## 2017-12-28 NOTE — Telephone Encounter (Signed)
New message   Patient calling for lab results and report    Pt c/o swelling: STAT is pt has developed SOB within 24 hours  1) How much weight have you gained and in what time span? n/a  2) If swelling, where is the swelling located? LEGS, ANKLES AND FEET  3) Are you currently taking a fluid pill? YES  4) Are you currently SOB? YES  5) Do you have a log of your daily weights (if so, list)? 100 lbs today  6) Have you gained 3 pounds in a day or 5 pounds in a week? n/a  7) Have you traveled recently? NO

## 2017-12-28 NOTE — Telephone Encounter (Signed)
I discussed the case with Dr. Gwenlyn Found, ok to discontinue plavix, that should help with her bruising.

## 2017-12-29 ENCOUNTER — Encounter: Payer: Self-pay | Admitting: Physician Assistant

## 2017-12-29 ENCOUNTER — Other Ambulatory Visit: Payer: Self-pay

## 2017-12-29 DIAGNOSIS — I2510 Atherosclerotic heart disease of native coronary artery without angina pectoris: Secondary | ICD-10-CM

## 2017-12-29 DIAGNOSIS — I5042 Chronic combined systolic (congestive) and diastolic (congestive) heart failure: Secondary | ICD-10-CM

## 2017-12-29 NOTE — Addendum Note (Signed)
Addended by: Alvina Filbert B on: 12/29/2017 01:11 PM   Modules accepted: Orders

## 2017-12-29 NOTE — Telephone Encounter (Signed)
Follow Up:    Returning Kimberly Downs's call from yesterday.

## 2017-12-29 NOTE — Telephone Encounter (Signed)
Left message to call back   [name], [place] at 12/28/2017 5:24 PM   Status: Signed    I discussed the case with Dr. Gwenlyn Found, ok to discontinue plavix, that should help with her bruising.     Alvina Filbert B at 12/28/2017 4:34 PM   Status: Signed    1 - Advised patient, verbalized understanding.  Patient is currently taking Furosemide 20 mg 2 in the am and 1 in the evening as needed. She has taken 1 extra dose since seeing [employer] PA. She will take her potassium for a couple of days prior to making change in her Furosemide   2 - Patient stated she wanted to know if she needed to continue Plavix and she had discussed with Isaac Laud. Will forward for review   Notes recorded by [name], PA on 12/26/2017 at 10:07 AM EDT Potassium low, BNP continue to be high. Recommend add Potassium Chloride to 20 meq BID, and start 60mg  daily of lasix this Thur. Repeat labwork in 1-2 weeks

## 2017-12-29 NOTE — Telephone Encounter (Signed)
This encounter was created in error - please disregard.

## 2017-12-29 NOTE — Telephone Encounter (Signed)
Advised patient, verbalized understanding  

## 2018-02-06 ENCOUNTER — Ambulatory Visit: Payer: Medicare Other | Admitting: Cardiovascular Disease

## 2018-02-13 ENCOUNTER — Ambulatory Visit: Payer: Medicare Other | Admitting: Cardiology

## 2018-02-15 ENCOUNTER — Ambulatory Visit (INDEPENDENT_AMBULATORY_CARE_PROVIDER_SITE_OTHER): Payer: Medicare Other | Admitting: *Deleted

## 2018-02-15 DIAGNOSIS — Z9189 Other specified personal risk factors, not elsewhere classified: Secondary | ICD-10-CM | POA: Diagnosis not present

## 2018-02-15 NOTE — Progress Notes (Signed)
Remote ICD transmission.   

## 2018-02-17 LAB — CUP PACEART REMOTE DEVICE CHECK
Battery Remaining Longevity: 135 mo
HighPow Impedance: 69 Ohm
Implantable Lead Implant Date: 20181128
Implantable Lead Location: 753860
Implantable Pulse Generator Implant Date: 20181128
Lead Channel Impedance Value: 304 Ohm
Lead Channel Pacing Threshold Amplitude: 1 V
Lead Channel Pacing Threshold Pulse Width: 0.4 ms
Lead Channel Sensing Intrinsic Amplitude: 11.375 mV
Lead Channel Setting Pacing Pulse Width: 0.4 ms
Lead Channel Setting Sensing Sensitivity: 0.3 mV
MDC IDC MSMT BATTERY VOLTAGE: 3.06 V
MDC IDC MSMT LEADCHNL RV IMPEDANCE VALUE: 399 Ohm
MDC IDC MSMT LEADCHNL RV SENSING INTR AMPL: 11.375 mV
MDC IDC SESS DTM: 20190619052305
MDC IDC SET LEADCHNL RV PACING AMPLITUDE: 2 V
MDC IDC STAT BRADY RV PERCENT PACED: 0.02 %
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
## 2018-03-14 ENCOUNTER — Ambulatory Visit: Payer: Medicare Other | Admitting: Pulmonary Disease

## 2018-03-14 ENCOUNTER — Encounter: Payer: Self-pay | Admitting: Pulmonary Disease

## 2018-03-14 VITALS — BP 130/78 | HR 71 | Ht 60.0 in | Wt 103.8 lb

## 2018-03-14 DIAGNOSIS — Z720 Tobacco use: Secondary | ICD-10-CM | POA: Diagnosis not present

## 2018-03-14 DIAGNOSIS — J019 Acute sinusitis, unspecified: Secondary | ICD-10-CM | POA: Insufficient documentation

## 2018-03-14 DIAGNOSIS — J441 Chronic obstructive pulmonary disease with (acute) exacerbation: Secondary | ICD-10-CM | POA: Diagnosis not present

## 2018-03-14 DIAGNOSIS — J0100 Acute maxillary sinusitis, unspecified: Secondary | ICD-10-CM

## 2018-03-14 MED ORDER — LEVALBUTEROL HCL 0.63 MG/3ML IN NEBU
0.6300 mg | INHALATION_SOLUTION | RESPIRATORY_TRACT | Status: AC
  Administered 2018-03-14: 0.63 mg via RESPIRATORY_TRACT

## 2018-03-14 MED ORDER — DOXYCYCLINE HYCLATE 100 MG PO TABS: 100.0000 mg | ORAL_TABLET | Freq: Two times a day (BID) | ORAL | 0 refills | Status: DC

## 2018-03-14 NOTE — Progress Notes (Signed)
Reviewed, agree 

## 2018-03-14 NOTE — Assessment & Plan Note (Signed)
Switch antibiotic from azithromycin to doxycycline Take doxycycline with food Follow-up with your symptoms worsen

## 2018-03-14 NOTE — Patient Instructions (Addendum)
Breathing treatment today in office  We will switch antibiotic from azithromycin to doxycycline Doxycycline >>> 1 100 mg tablet every 12 hours for 7 days >>>take with food  >>>wear sunscreen    Continue Symbicort >>> 2 puffs in the morning right when you wake up, rinse out your mouth after use, 12 hours later 2 puffs, rinse after use >>> Take this daily, no matter what >>> This is not a rescue inhaler   Continue albuterol rescue inhaler as needed for shortness of breath and wheezing   Continue Tessalon for your cough Can also use Ultram for pain if you have not  Will work to try to get chest x-ray results from primary care  We recommend that you stop smoking: 1 800 QUIT NOW  >>> Patient to call this resource and utilize it to help support her quit smoking >>> Keep up your hard work with stopping smoking  You can also contact the [facility] >>>For smoking cessation classes call [phone]  We do not recommend using e-cigarettes as a form of stopping smoking   We will refer you today to her lung cancer screening program >>>This is based off of your pack-year smoking history >>> This is a recommendation from the [place] preventative services task force (USPSTF) >>>The USPSTF recommends annual screening for lung cancer with low-dose computed tomography (LDCT) in adults aged 13 to 11 years who have a 30 pack-year smoking history and currently smoke or have quit within the past 15 years. Screening should be discontinued once a person has not smoked for 15 years or develops a health problem that substantially limits life expectancy or the ability or willingness to have curative lung surgery.   Our office will call you and set up an appointment with Eric Form (Nurse Practitioner) who leads this program.  This appointment takes place in our office.  After completing this meeting with Eric Form NP you will get a low-dose CT as the screening >>>We will call you with those  results     Follow-up with Dr. Lake Bells in 6-8 weeks  Please contact the office if your symptoms worsen or you have concerns that you are not improving.   Thank you for choosing [facility] for your healthcare, and for allowing [place] to partner with you on your healthcare journey. I am thankful to be able to provide care to you today.   [name]-C

## 2018-03-14 NOTE — Assessment & Plan Note (Signed)
We recommend that you stop smoking: 1 800 QUIT NOW  >>> Patient to call this resource and utilize it to help support her quit smoking >>> Keep up your hard work with stopping smoking  You can also contact the [facility] >>>For smoking cessation classes call [phone]  We do not recommend using e-cigarettes as a form of stopping smoking   We will refer you today to her lung cancer screening program >>>This is based off of your pack-year smoking history >>> This is a recommendation from the [place] preventative services task force (USPSTF) >>>The USPSTF recommends annual screening for lung cancer with low-dose computed tomography (LDCT) in adults aged 27 to 80 years who have a 30 pack-year smoking history and currently smoke or have quit within the past 15 years. Screening should be discontinued once a person has not smoked for 15 years or develops a health problem that substantially limits life expectancy or the ability or willingness to have curative lung surgery.   Our office will call you and set up an appointment with Eric Form (Nurse Practitioner) who leads this program.  This appointment takes place in our office.  After completing this meeting with Eric Form NP you will get a low-dose CT as the screening >>>We will call you with those results

## 2018-03-14 NOTE — Assessment & Plan Note (Addendum)
Continue Symbicort Continue prednisone from primary care Will start doxycycline Stop taking azithromycin  Note your daily symptoms > remember "red flags" for COPD:   >>>Increase in cough >>>increase in sputum production >>>increase in shortness of breath or activity  intolerance.   If you notice these symptoms, please call the office to be seen.

## 2018-03-16 ENCOUNTER — Other Ambulatory Visit: Payer: Self-pay | Admitting: Acute Care

## 2018-03-16 DIAGNOSIS — F17210 Nicotine dependence, cigarettes, uncomplicated: Secondary | ICD-10-CM

## 2018-03-16 DIAGNOSIS — Z122 Encounter for screening for malignant neoplasm of respiratory organs: Secondary | ICD-10-CM

## 2018-03-20 ENCOUNTER — Telehealth: Payer: Self-pay | Admitting: Pulmonary Disease

## 2018-03-20 NOTE — Telephone Encounter (Signed)
lmtcb for Lauren.

## 2018-03-21 NOTE — Telephone Encounter (Signed)
Attempted to call Berniece Andreas case mgt with [facility] at [phone] regarding doxycycline cost too much for pt, she is requesting something cheaper  Pt is still having productive cough and SOB. I did not receive an answer at time of call. I have left a voicemail message for pt to return call. X1

## 2018-03-21 NOTE — Telephone Encounter (Signed)
Ander Purpura, nurse case mgr with [facility] [phone].  Calling stating that patient was given incorrect price yesterday and was able to pick up doxycycline and so nothing further is needed.  Message may be closed.

## 2018-03-27 ENCOUNTER — Inpatient Hospital Stay: Admission: RE | Admit: 2018-03-27 | Payer: Medicare Other | Source: Ambulatory Visit

## 2018-03-27 ENCOUNTER — Encounter: Payer: Medicare Other | Admitting: Acute Care

## 2018-04-04 ENCOUNTER — Telehealth: Payer: Self-pay | Admitting: Cardiovascular Disease

## 2018-04-04 NOTE — Telephone Encounter (Signed)
Pt c/o swelling: STAT is pt has developed SOB within 24 hours  1) How much weight have you gained and in what time span? 7lb in 17days  2) If swelling, where is the swelling located? Feet and hands  3) Are you currently taking a fluid pill? yes  4) Are you currently SOB? Yes  5) Do you have a log of your daily weights (if so, list)? Yes  6) Have you gained 3 pounds in a day or 5 pounds in a week? Yes  7) Have you traveled recently? No   BNP was elevated at PCP office. Please call the pt back at [phone]

## 2018-04-04 NOTE — Telephone Encounter (Signed)
Left message to call back  

## 2018-04-10 NOTE — Telephone Encounter (Signed)
Returned call to patient. She states she saw her PCP who changed her from lasix to torsemide 20mg  QD. She states she is doing ?much much better" and her weight is coming down and her swelling and breathing have improved. No further assistance needed. Med list updated.

## 2018-04-21 ENCOUNTER — Telehealth: Payer: Self-pay | Admitting: Cardiovascular Disease

## 2018-04-21 ENCOUNTER — Encounter: Payer: Medicare Other | Admitting: Acute Care

## 2018-04-21 ENCOUNTER — Ambulatory Visit: Admission: RE | Admit: 2018-04-21 | Payer: Medicare Other | Source: Ambulatory Visit

## 2018-04-21 NOTE — Telephone Encounter (Signed)
New message   Pt c/o swelling: STAT is pt has developed SOB within 24 hours  1) How much weight have you gained and in what time span? Per Lauren, patient has gained 3.6 lbs in 3 days.  2) If swelling, where is the swelling located? hands  3) Are you currently taking a fluid pill? yes  4) Are you currently SOB? yes  5) Do you have a log of your daily weights (if so, list)? Yes, will fax log to [employer].  6) Have you gained 3 pounds in a day or 5 pounds in a week? No   7) Have you traveled recently? No   Per Lauren, Please contact the patient.

## 2018-04-21 NOTE — Telephone Encounter (Signed)
Attempted to contact pt.  Left message to call back.  

## 2018-04-24 NOTE — Telephone Encounter (Signed)
Called patient she stated that she has been having issues with increased swelling, and weight gain. Yesterday she became very SOB and swollen, she took her medication as prescirbed torsemide 20 mg, and weighed herself she was 109lbs yesterday, that increased from the previous day when she was 104, patient is unsure of why she continues to gain that much weight. Patient states that today weight was 103.2. Patient states she is still SOB, denies chest pains, patient understood to elevate legs and to go to ED if she becomes SOB, increased swelling/weight gain, and has chest pains. Patient verbalized understanding. Patient request to be seen to discuss what could be the cause of the weight.

## 2018-04-24 NOTE — Telephone Encounter (Signed)
Appointment made to see [id number]

## 2018-04-25 ENCOUNTER — Ambulatory Visit: Payer: Medicare Other | Admitting: Pulmonary Disease

## 2018-05-01 IMAGING — CR DG CHEST 2V
2 series · 2 of 2 positions shown · non-contrast
Comparison: Chest x-ray 03/15/2017.

CLINICAL DATA: AICD insertion.

EXAM:
CHEST  2 VIEW

[chest pa]
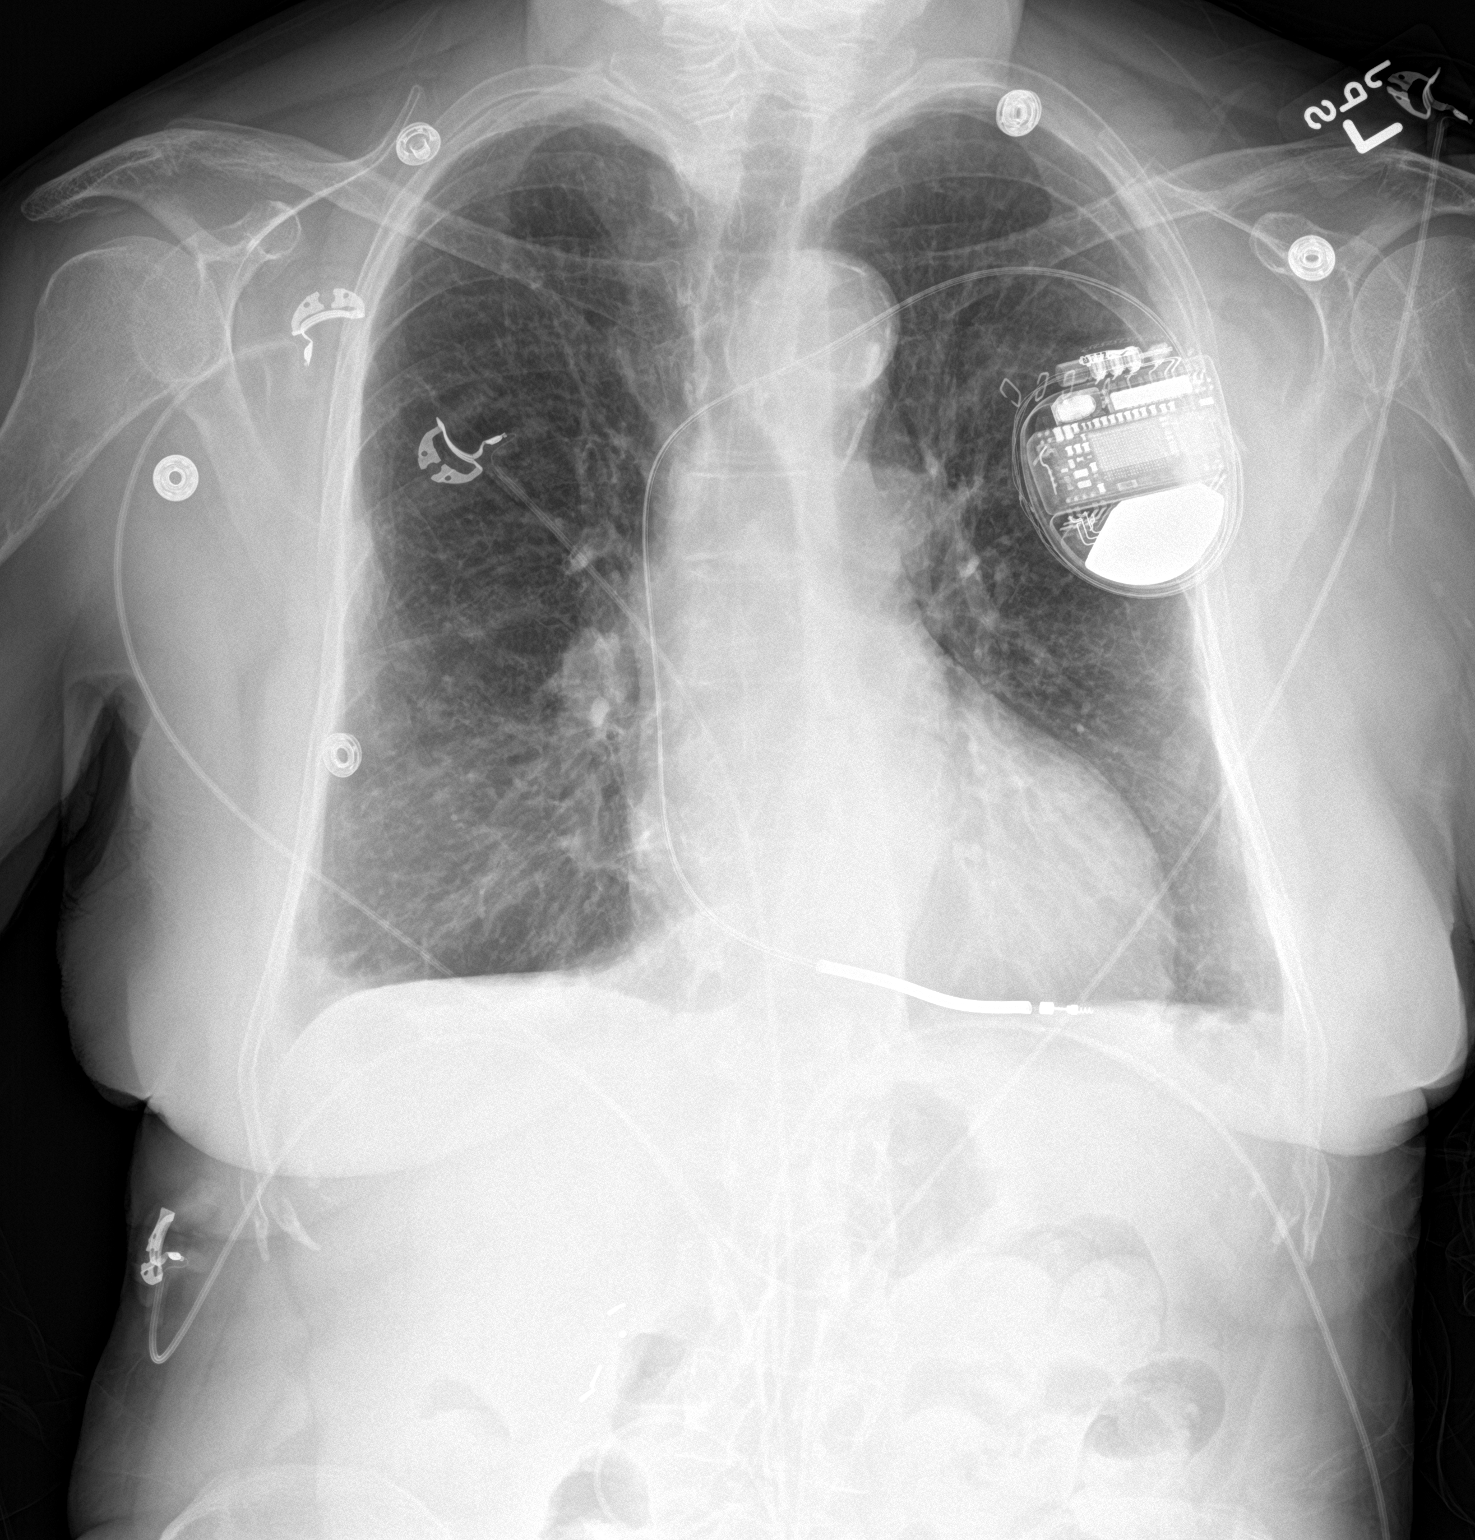

[chest lat]
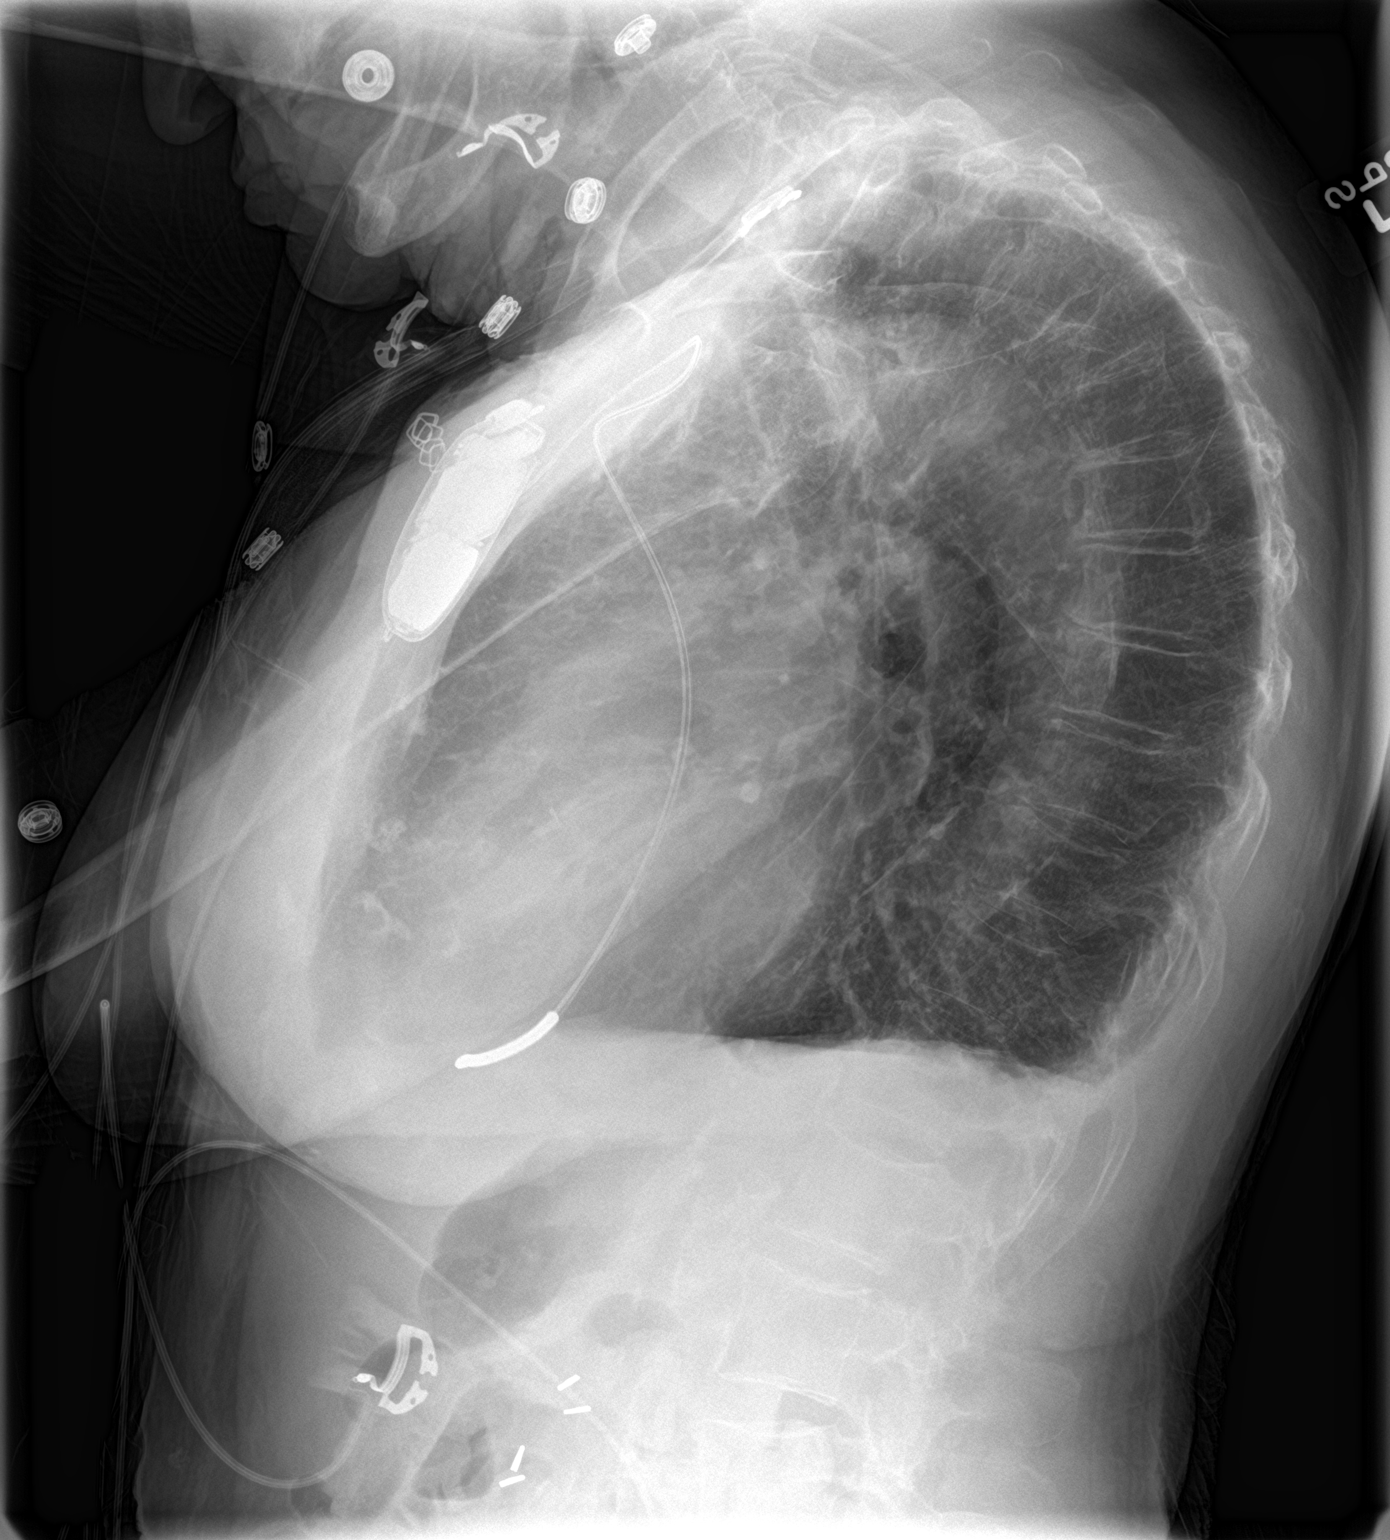

[2 of 2 positions shown; findings below may reference images not displayed]

FINDINGS: AICD noted tip projected right ventricle. Stable cardiomegaly. No
pulmonary venous congestion. Mild bibasilar subsegmental atelectasis
and/or scarring. No prominent pleural effusion or pneumothorax.
Diffuse thoracic cage osteopenia. Surgical clips right upper
quadrant.
IMPRESSION: AICD noted with lead tip over the right ventricle. No complicating
features. No acute abnormality. No pneumothorax. Stable
cardiomegaly.
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
## 2018-05-03 ENCOUNTER — Encounter: Payer: Self-pay | Admitting: Adult Health

## 2018-05-03 ENCOUNTER — Ambulatory Visit: Payer: Medicare Other | Admitting: Adult Health

## 2018-05-03 VITALS — BP 155/89 | HR 71 | Ht 60.0 in | Wt 103.8 lb

## 2018-05-03 DIAGNOSIS — I43 Cardiomyopathy in diseases classified elsewhere: Secondary | ICD-10-CM

## 2018-05-03 DIAGNOSIS — Z95810 Presence of automatic (implantable) cardiac defibrillator: Secondary | ICD-10-CM

## 2018-05-03 DIAGNOSIS — I10 Essential (primary) hypertension: Secondary | ICD-10-CM | POA: Diagnosis not present

## 2018-05-03 DIAGNOSIS — Z79899 Other long term (current) drug therapy: Secondary | ICD-10-CM

## 2018-05-03 DIAGNOSIS — I5022 Chronic systolic (congestive) heart failure: Secondary | ICD-10-CM

## 2018-05-03 DIAGNOSIS — Z720 Tobacco use: Secondary | ICD-10-CM

## 2018-05-03 IMAGING — DX DG CHEST 1V PORT
1 series · 1 of 1 positions shown · non-contrast
Comparison: 07/28/2017

CLINICAL DATA: Altered mental status

EXAM:
PORTABLE CHEST 1 VIEW

[chest ap]
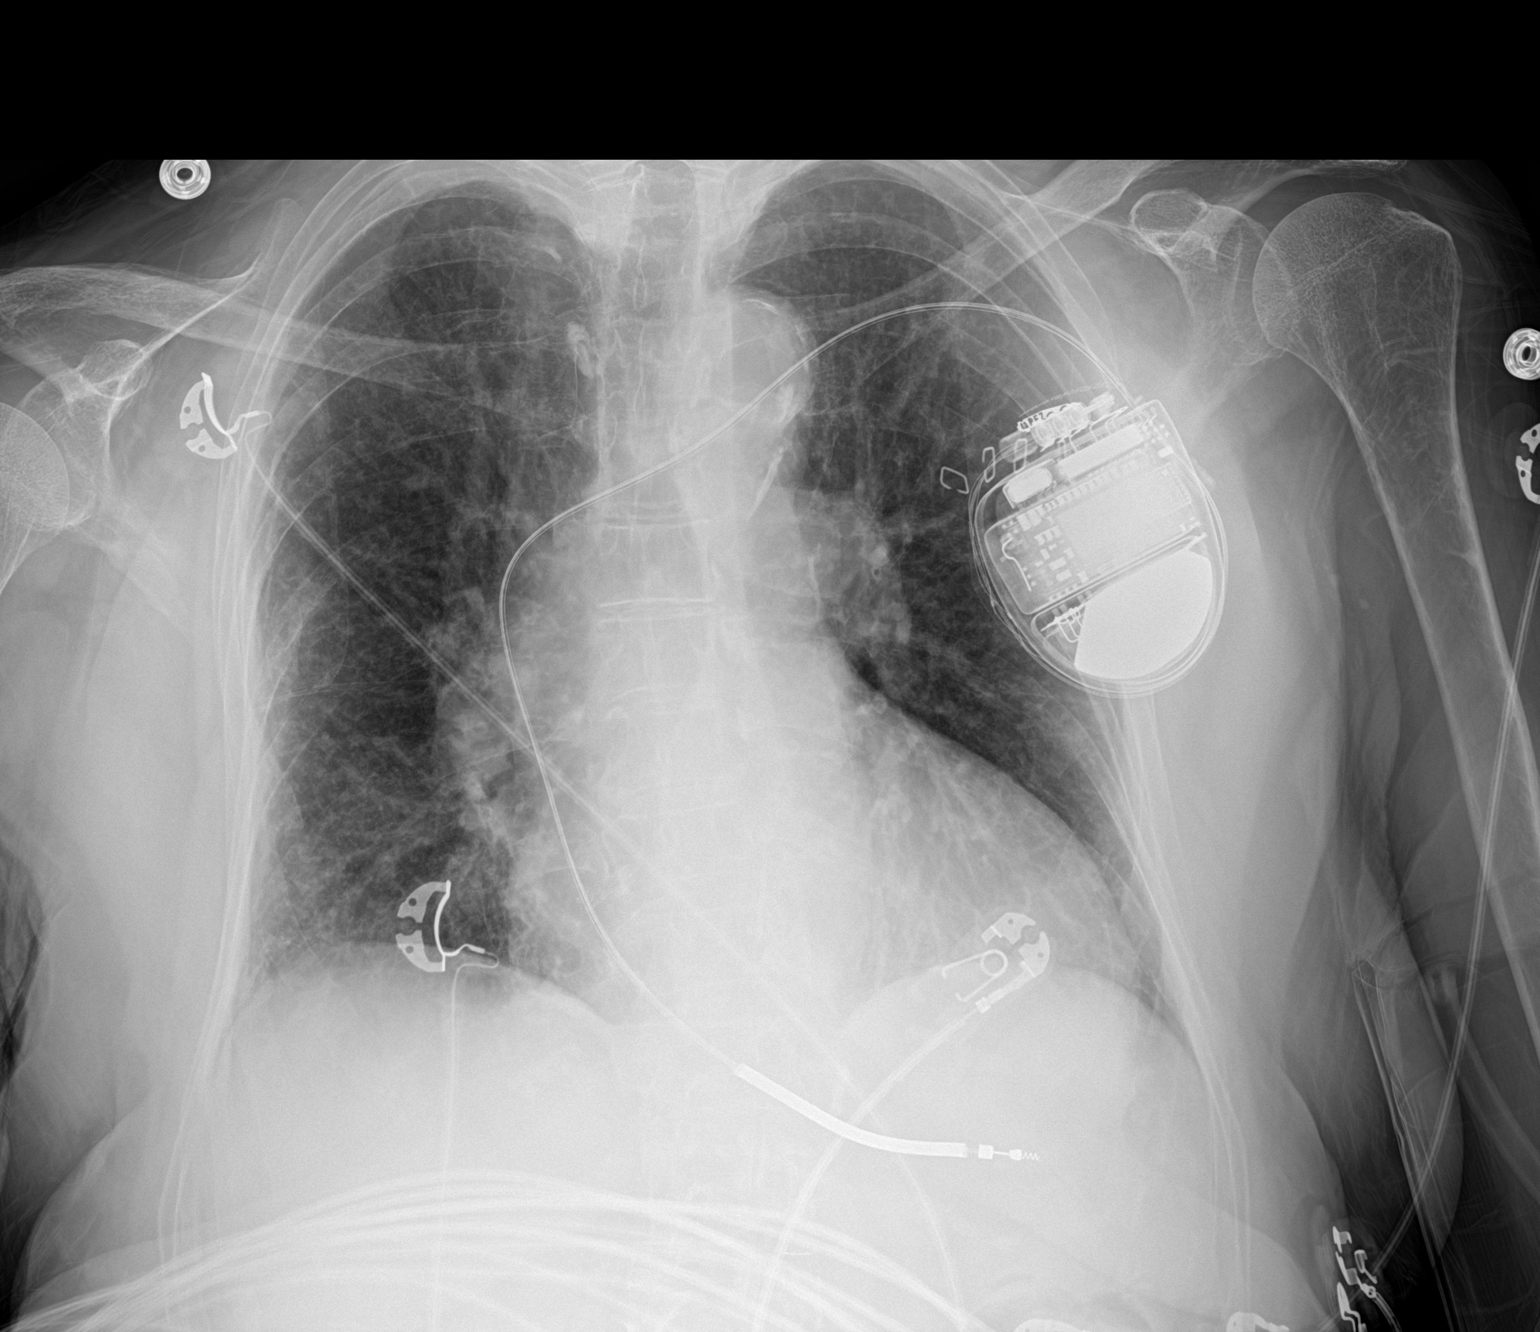

[1 of 1 positions shown; findings below may reference images not displayed]

FINDINGS: Left AICD remains in place, unchanged. Cardiomegaly with vascular
congestion. Interstitial prominence in the lungs is similar prior
study, likely chronic interstitial lung disease. No pneumothorax or
effusion. No acute bony abnormality.
IMPRESSION: Cardiomegaly, vascular congestion.

Chronic interstitial prominence, likely chronic interstitial lung
disease.

## 2018-05-03 MED ORDER — TORSEMIDE 20 MG PO TABS: 20.0000 mg | ORAL_TABLET | Freq: Every day | ORAL | 3 refills | Status: DC

## 2018-05-03 NOTE — Patient Instructions (Signed)
Kimberly Bears NP recommends that you return for lab work TODAY  It is recommended that you schedule a follow-up appointment in: [place] with Dr. Sallyanne Kuster.

## 2018-05-04 ENCOUNTER — Other Ambulatory Visit: Payer: Self-pay

## 2018-05-04 DIAGNOSIS — I10 Essential (primary) hypertension: Secondary | ICD-10-CM

## 2018-05-04 DIAGNOSIS — I5042 Chronic combined systolic (congestive) and diastolic (congestive) heart failure: Secondary | ICD-10-CM

## 2018-05-04 LAB — BASIC METABOLIC PANEL
BUN / CREAT RATIO: 17 (ref 12–28)
BUN: 20 mg/dL (ref 8–27)
CHLORIDE: 98 mmol/L (ref 96–106)
CO2: 30 mmol/L — ABNORMAL HIGH (ref 20–29)
Calcium: 9.6 mg/dL (ref 8.7–10.3)
Creatinine, Ser: 1.16 mg/dL — ABNORMAL HIGH (ref 0.57–1.00)
GFR calc non Af Amer: 47 mL/min/{1.73_m2} — ABNORMAL LOW (ref >59)
GFR, EST AFRICAN AMERICAN: 54 mL/min/{1.73_m2} — AB (ref >59)
Glucose: 119 mg/dL — ABNORMAL HIGH (ref 65–99)
POTASSIUM: 3.5 mmol/L (ref 3.5–5.2)
Sodium: 145 mmol/L — ABNORMAL HIGH (ref 134–144)

## 2018-05-04 NOTE — Progress Notes (Signed)
bmet  

## 2018-05-08 ENCOUNTER — Telehealth: Payer: Self-pay | Admitting: Cardiovascular Disease

## 2018-05-08 NOTE — Telephone Encounter (Signed)
No encounter notes.

## 2018-05-08 NOTE — Telephone Encounter (Incomplete)
New Message         Pt c/o swelling: STAT is pt has developed SOB within 24 hours  1) How much weight have you gained and in what time span? 2.4 pds in 4 days  2) If swelling, where is the swelling located? Hands/SOB/Cough  3) Are you currently taking a fluid pill? Yes/Torsemide 20 mg Once day  4) Are you currently SOB? yes  5) Do you have a log of your daily weights (if so, list)? ***  6) Have you gained 3 pounds in a day or 5 pounds in a week? ***  7) Have you traveled recently? ***

## 2018-05-10 ENCOUNTER — Telehealth: Payer: Self-pay | Admitting: Cardiovascular Disease

## 2018-05-10 DIAGNOSIS — Z79899 Other long term (current) drug therapy: Secondary | ICD-10-CM

## 2018-05-10 NOTE — Telephone Encounter (Signed)
New message   Pt c/o swelling: STAT is pt has developed SOB within 24 hours  1) How much weight have you gained and in what time span? 3.6 lbs over last 6 days  2) If swelling, where is the swelling located?hands   3) Are you currently taking a fluid pill? yes  4) Are you currently SOB? yes  5) Do you have a log of your daily weights (if so, list)? Yes, will fax the log to office  6) Have you gained 3 pounds in a day or 5 pounds in a week? No   7) Have you traveled recently? No

## 2018-05-10 NOTE — Telephone Encounter (Signed)
Called patient, advised of note from Dr.Croitoru. Patient verbalized understanding, and will come for labs tomorrow. Labs ordered.

## 2018-05-10 NOTE — Telephone Encounter (Signed)
Yes, OK to take extra torsemide, but also take an extra KCl tab and recheck BMET and BNP tomorrow, please.

## 2018-05-10 NOTE — Telephone Encounter (Signed)
Attempted to contact home health nurse lauren, unable to reach her. Contacted patient and discussed her issues. Patient stated that she has been gaining 1 lb every day for the past few days. She has noticed increase swelling in her hands/legs, she is SOB accompanied by coughing. Patient states she is fatigued and just does not feel well. Patient did say that she had family that came in that was sick, and she may have caught something from them as well. Patient is taking her torsemide 20 mg and her potassium as given at last appointment. Patient denies chest pains, please advise if okay to have patient take an extra dose of torsemide today, wanted to have clarification due to her recent increase in labs. Patient is using her nebulizer machine for her SOB as well.   Thank you!

## 2018-05-16 ENCOUNTER — Telehealth: Payer: Self-pay | Admitting: Cardiovascular Disease

## 2018-05-16 NOTE — Telephone Encounter (Signed)
° °  Lauren [phone] ext [phone]  Lauren from [facility] calling to report 3.4lbs weight loss in 6 days. Patient is having leg weakness Low Potassium. Requesting Potassium be checked   Patient is requesting lab order be sent to her PCP  Rikki Spearing in [place], to get labs done closer to home

## 2018-05-16 NOTE — Telephone Encounter (Signed)
Spoke with patients PCP and they do not do outside labs but recommended [facility] in [place]. Spoke with patient and she will go there tomorrow for labs.  Left message with Kimberly Downs

## 2018-05-16 NOTE — Telephone Encounter (Signed)
Left message to call back  

## 2018-05-16 NOTE — Telephone Encounter (Signed)
Follow up   Returning your call.

## 2018-05-17 ENCOUNTER — Ambulatory Visit (INDEPENDENT_AMBULATORY_CARE_PROVIDER_SITE_OTHER): Payer: Medicare Other | Admitting: *Deleted

## 2018-05-17 DIAGNOSIS — I469 Cardiac arrest, cause unspecified: Secondary | ICD-10-CM | POA: Diagnosis not present

## 2018-05-17 NOTE — Telephone Encounter (Signed)
Spoke to [facility] who states she is calling to verify that the labs ordered included potassium.  Advised BMET and ProBNP were ordered.  Patient will be going to get this drawn today.    Patient is still having leg cramping and weakness and has history of low potassium.

## 2018-05-17 NOTE — Telephone Encounter (Signed)
Attempt to return call to Lauren-unable to reach with extension provided.

## 2018-05-17 NOTE — Progress Notes (Signed)
Remote ICD transmission.   

## 2018-05-22 ENCOUNTER — Telehealth: Payer: Self-pay | Admitting: Acute Care

## 2018-05-23 NOTE — Telephone Encounter (Signed)
Spoke with pt and explained that we are a participating provider with [facility] Medicare and that per Medicare guidelines the lung cancer screening is covered at 100% and if she receives bills that she does not understand then she can call our office and we can help her with this.  Pt verbalized understanding and will keep SDMV and CT appt for 05/24/18. Nothing further needed.

## 2018-05-24 ENCOUNTER — Encounter: Payer: Self-pay | Admitting: Acute Care

## 2018-05-24 ENCOUNTER — Inpatient Hospital Stay: Admission: RE | Admit: 2018-05-24 | Payer: Medicare Other | Source: Ambulatory Visit

## 2018-05-24 ENCOUNTER — Ambulatory Visit (INDEPENDENT_AMBULATORY_CARE_PROVIDER_SITE_OTHER)
Admission: RE | Admit: 2018-05-24 | Discharge: 2018-05-24 | Disposition: A | Discharge status: Home / Self Care | Payer: Medicare Other | Source: Ambulatory Visit | Attending: Acute Care | Admitting: Acute Care

## 2018-05-24 ENCOUNTER — Encounter: Payer: Medicare Other | Admitting: Acute Care

## 2018-05-24 ENCOUNTER — Ambulatory Visit (INDEPENDENT_AMBULATORY_CARE_PROVIDER_SITE_OTHER): Payer: Medicare Other | Admitting: Acute Care

## 2018-05-24 DIAGNOSIS — F17210 Nicotine dependence, cigarettes, uncomplicated: Secondary | ICD-10-CM

## 2018-05-24 DIAGNOSIS — Z122 Encounter for screening for malignant neoplasm of respiratory organs: Secondary | ICD-10-CM

## 2018-05-24 NOTE — Progress Notes (Signed)
Shared Decision Making Visit Lung Cancer Screening Program [phone])   Eligibility:  Age 72 y.o.  Pack Years Smoking History Calculation 42 pack year smoking history (# packs/per year x # years smoked)  Recent History of coughing up blood  no  Unexplained weight loss? no ( >Than 15 pounds within the last 6 months )  Prior History Lung / other cancer no (Diagnosis within the last 5 years already requiring surveillance chest CT Scans).  Smoking Status Current Smoker  Former Smokers: Years since quit: NA  Quit Date: NA  Visit Components:  Discussion included one or more decision making aids. yes  Discussion included risk/benefits of screening. yes  Discussion included potential follow up diagnostic testing for abnormal scans. yes  Discussion included meaning and risk of over diagnosis. yes  Discussion included meaning and risk of False Positives. yes  Discussion included meaning of total radiation exposure. yes  Counseling Included:  Importance of adherence to annual lung cancer LDCT screening. yes  Impact of comorbidities on ability to participate in the program. yes  Ability and willingness to under diagnostic treatment. yes  Smoking Cessation Counseling:  Current Smokers:   Discussed importance of smoking cessation. yes  Information about tobacco cessation classes and interventions provided to patient. yes  Patient provided with "ticket" for LDCT Scan. yes  Symptomatic Patient. no  Counseling  Diagnosis Code: Tobacco Use Z72.0  Asymptomatic Patient yes  Counseling (Intermediate counseling: > three minutes counseling) V3710  Former Smokers:   Discussed the importance of maintaining cigarette abstinence. yes  Diagnosis Code: Personal History of Nicotine Dependence. G26.948  Information about tobacco cessation classes and interventions provided to patient. Yes  Patient provided with "ticket" for LDCT Scan. yes  Written Order for Lung Cancer  Screening with LDCT placed in Epic. Yes (CT Chest Lung Cancer Screening Low Dose W/O CM) [id number] Z12.2-Screening of respiratory organs Z87.891-Personal history of nicotine dependence  I have spent 25 minutes of face to face time with Kimberly Downs discussing the risks and benefits of lung cancer screening. We viewed a power point together that explained in detail the above noted topics. We paused at intervals to allow for questions to be asked and answered to ensure understanding.We discussed that the single most powerful action that she can take to decrease her risk of developing lung cancer is to quit smoking. We discussed whether or not she is ready to commit to setting a quit date.She is not ready to set a quit date. We discussed options for tools to aid in quitting smoking including nicotine replacement therapy, non-nicotine medications, support groups, Quit Smart classes, and behavior modification. We discussed that often times setting smaller, more achievable goals, such as eliminating 1 cigarette a day for a week and then 2 cigarettes a day for a week can be helpful in slowly decreasing the number of cigarettes smoked. This allows for a sense of accomplishment as well as providing a clinical benefit. I gave her the " Be Stronger Than Your Excuses" card with contact information for community resources, classes, free nicotine replacement therapy, and access to mobile apps, text messaging, and on-line smoking cessation help. I have also given her my card and contact information in the event she needs to contact me. We discussed the time and location of the scan, and that either Doroteo Glassman RN or I will call with the results within 24-48 hours of receiving them. I have offered her  a copy of the power point we viewed  as a  resource in the event they need reinforcement of the concepts we discussed today in the office. The patient verbalized understanding of all of  the above and had no further questions upon  leaving the office. They have my contact information in the event they have any further questions.  I spent 5 minutes counseling on smoking cessation and the health risks of continued tobacco abuse.  I explained to the patient that there has been a high incidence of coronary artery disease noted on these exams. I explained that this is a non-gated exam therefore degree or severity cannot be determined. This patient is on statin therapy. I have asked the patient to follow-up with their PCP regarding any incidental finding of coronary artery disease and management with diet or medication as their PCP  feels is clinically indicated. The patient verbalized understanding of the above and had no further questions upon completion of the visit.     Kimberly Spatz, NP 05/24/2018 1:34 PM

## 2018-05-25 ENCOUNTER — Telehealth: Payer: Self-pay | Admitting: Acute Care
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
## 2018-05-26 ENCOUNTER — Encounter: Payer: Self-pay | Admitting: Pulmonary Disease

## 2018-05-26 ENCOUNTER — Ambulatory Visit: Payer: Medicare Other | Admitting: Pulmonary Disease

## 2018-05-26 VITALS — BP 110/78 | HR 71 | Ht 59.0 in | Wt 103.6 lb

## 2018-05-26 DIAGNOSIS — Z23 Encounter for immunization: Secondary | ICD-10-CM | POA: Diagnosis not present

## 2018-05-26 DIAGNOSIS — J449 Chronic obstructive pulmonary disease, unspecified: Secondary | ICD-10-CM

## 2018-05-26 DIAGNOSIS — Z720 Tobacco use: Secondary | ICD-10-CM

## 2018-05-26 DIAGNOSIS — F17210 Nicotine dependence, cigarettes, uncomplicated: Secondary | ICD-10-CM | POA: Diagnosis not present

## 2018-05-26 MED ORDER — BENZONATATE 200 MG PO CAPS: 200.0000 mg | ORAL_CAPSULE | Freq: Three times a day (TID) | ORAL | 1 refills | Status: DC | PRN

## 2018-05-26 MED ORDER — FLUTTER DEVI: 0 refills | Status: DC

## 2018-05-26 MED ORDER — FLUTICASONE-UMECLIDIN-VILANT 100-62.5-25 MCG/INH IN AEPB: 1.0000 | INHALATION_SPRAY | Freq: Every day | RESPIRATORY_TRACT | 0 refills | Status: DC

## 2018-05-26 NOTE — Assessment & Plan Note (Signed)
We recommend that you stop smoking.   Smoking Cessation Resources:  1 800 QUIT NOW  >>> Patient to call this resource and utilize it to help support her quit smoking >>> Keep up your hard work with stopping smoking  You can also contact the [facility] >>>For smoking cessation classes call [phone]  We do not recommend using e-cigarettes as a form of stopping smoking   Referral Pulmonary Rehab  >>> We will try [facility]   We will follow-up with your CT annually with our lung cancer screening  If you notice these symptoms, please call the office to be seen.   4 week follow up with our office  High-dose flu vaccine today

## 2018-05-26 NOTE — Assessment & Plan Note (Signed)
Trelegy Ellipta  >>> 1 puff daily in the morning >>>rinse mouth out after use  >>> This inhaler contains 3 medications that help manage her respiratory status, contact our office if you cannot afford this medication or unable to remain on this medication >>>Use this in place of Symbicort   Referral Pulmonary Rehab  >>> We will try [place]   Note your daily symptoms > remember "red flags" for COPD:   >>>Increase in cough >>>increase in sputum production >>>increase in shortness of breath or activity  intolerance.   If you notice these symptoms, please call the office to be seen.   4 week follow up with our office  High-dose flu vaccine today  Please stop smoking

## 2018-05-26 NOTE — Patient Instructions (Addendum)
Trelegy Ellipta  >>> 1 puff daily in the morning >>>rinse mouth out after use  >>> This inhaler contains 3 medications that help manage her respiratory status, contact our office if you cannot afford this medication or unable to remain on this medication >>>Use this in place of Symbicort   Bronchiectasis: This is the medical term which indicates that you have damage, dilated airways making you more susceptible to respiratory infection. Use a flutter valve 10 breaths twice a day or 4 to 5 breaths 4-5 times a day to help clear mucus out Let us know if you have cough with change in mucus color or fevers or chills.  At that point you would need an antibiotic. Maintain a healthy nutritious diet, eating whole foods Take your medications as prescribed    Referral Pulmonary Rehab  >>> We will try [facility]   We will follow-up with your CT annually with our lung cancer screening  Note your daily symptoms > remember "red flags" for COPD:   >>>Increase in cough >>>increase in sputum production >>>increase in shortness of breath or activity  intolerance.   If you notice these symptoms, please call the office to be seen.   4 week follow up with our office  High-dose flu vaccine today    It is flu season:   >>>Remember to be washing your hands regularly, using hand sanitizer, be careful to use around herself with has contact with people who are sick will increase her chances of getting sick yourself. >>> Best ways to protect herself from the flu: Receive the yearly flu vaccine, practice good hand hygiene washing with soap and also using hand sanitizer when available, eat a nutritious meals, get adequate rest, hydrate appropriately    As of 07/03/2018 we will be moving! We will no longer be at our [place] location.   Our new address and phone number will be:  [address] [place], [place] [address] Telephone number: [phone]   Please contact the office if  your symptoms worsen or you have concerns that you are not improving.   Thank you for choosing [facility] for your healthcare, and for allowing [place] to partner with you on your healthcare journey. I am thankful to be able to provide care to you today.   [name]-C   Influenza Virus Vaccine injection What is this medicine? INFLUENZA VIRUS VACCINE (in floo EN zuh VAHY ruhs vak SEEN) helps to reduce the risk of getting influenza also known as the flu. The vaccine only helps protect you against some strains of the flu. This medicine may be used for other purposes; ask your health care provider or pharmacist if you have questions. COMMON BRAND NAME(S): Afluria, Agriflu, Alfuria, FLUAD, Fluarix, Fluarix Quadrivalent, Flublok, Flublok Quadrivalent, FLUCELVAX, Flulaval, Fluvirin, Fluzone, Fluzone High-Dose, Fluzone Intradermal What should I tell my health care provider before I take this medicine? They need to know if you have any of these conditions: -bleeding disorder like hemophilia -fever or infection -Guillain-Barre syndrome or other neurological problems -immune system problems -infection with the human immunodeficiency virus (HIV) or AIDS -low blood platelet counts -multiple sclerosis -an unusual or allergic reaction to influenza virus vaccine, latex, other medicines, foods, dyes, or preservatives. Different brands of vaccines contain different allergens. Some may contain latex or eggs. Talk to your doctor about your allergies to make sure that you get the right vaccine. -pregnant or trying to get pregnant -breast-feeding How should I use this medicine?  This vaccine is for injection into a muscle or under the skin. It is given by a health care professional. A copy of Vaccine Information Statements will be given before each vaccination. Read this sheet carefully each time. The sheet may change frequently. Talk to your healthcare provider to see which vaccines are right for  you. Some vaccines should not be used in all age groups. Overdosage: If you think you have taken too much of this medicine contact a poison control center or emergency room at once. NOTE: This medicine is only for you. Do not share this medicine with others. What if I miss a dose? This does not apply. What may interact with this medicine? -chemotherapy or radiation therapy -medicines that lower your immune system like etanercept, anakinra, infliximab, and adalimumab -medicines that treat or prevent blood clots like warfarin -phenytoin -steroid medicines like prednisone or cortisone -theophylline -vaccines This list may not describe all possible interactions. Give your health care provider a list of all the medicines, herbs, non-prescription drugs, or dietary supplements you use. Also tell them if you smoke, drink alcohol, or use illegal drugs. Some items may interact with your medicine. What should I watch for while using this medicine? Report any side effects that do not go away within 3 days to your doctor or health care professional. Call your health care provider if any unusual symptoms occur within 6 weeks of receiving this vaccine. You may still catch the flu, but the illness is not usually as bad. You cannot get the flu from the vaccine. The vaccine will not protect against colds or other illnesses that may cause fever. The vaccine is needed every year. What side effects may I notice from receiving this medicine? Side effects that you should report to your doctor or health care professional as soon as possible: -allergic reactions like skin rash, itching or hives, swelling of the face, lips, or tongue Side effects that usually do not require medical attention (report to your doctor or health care professional if they continue or are bothersome): -fever -headache -muscle aches and pains -pain, tenderness, redness, or swelling at the injection site -tiredness This list may not describe  all possible side effects. Call your doctor for medical advice about side effects. You may report side effects to FDA at 1-800-FDA-1088. Where should I keep my medicine? The vaccine will be given by a health care professional in a clinic, pharmacy, doctor's office, or other health care setting. You will not be given vaccine doses to store at home. NOTE: This sheet is a summary. It may not cover all possible information. If you have questions about this medicine, talk to your doctor, pharmacist, or health care provider.  2018 Elsevier/Gold Standard (2015-03-07 10:07:28)        Chronic Obstructive Pulmonary Disease Chronic obstructive pulmonary disease (COPD) is a long-term (chronic) lung problem. When you have COPD, it is hard for air to get in and out of your lungs. The way your lungs work will never return to normal. Usually the condition gets worse over time. There are things you can do to keep yourself as healthy as possible. Your doctor may treat your condition with:  Medicines.  Quitting smoking, if you smoke.  Rehabilitation. This may involve a team of specialists.  Oxygen.  Exercise and changes to your diet.  Lung surgery.  Comfort measures (palliative care).  Follow these instructions at home: Medicines  Take over-the-counter and prescription medicines only as told by your doctor.  Talk to  your doctor before taking any cough or allergy medicines. You may need to avoid medicines that cause your lungs to be dry. Lifestyle  If you smoke, stop. Smoking makes the problem worse. If you need help quitting, ask your doctor.  Avoid being around things that make your breathing worse. This may include smoke, chemicals, and fumes.  Stay active, but remember to also rest.  Learn and use tips on how to relax.  Make sure you get enough sleep. Most adults need at least 7 hours a night.  Eat healthy foods. Eat smaller meals more often. Rest before meals. Controlled  breathing  Learn and use tips on how to control your breathing as told by your doctor. Try: ? Breathing in (inhaling) through your nose for 1 second. Then, pucker your lips and breath out (exhale) through your lips for 2 seconds. ? Putting one hand on your belly (abdomen). Breathe in slowly through your nose for 1 second. Your hand on your belly should move out. Pucker your lips and breathe out slowly through your lips. Your hand on your belly should move in as you breathe out. Controlled coughing  Learn and use controlled coughing to clear mucus from your lungs. The steps are: 1. Lean your head a little forward. 2. Breathe in deeply. 3. Try to hold your breath for 3 seconds. 4. Keep your mouth slightly open while coughing 2 times. 5. Spit any mucus out into a tissue. 6. Rest and do the steps again 1 or 2 times as needed. General instructions  Make sure you get all the shots (vaccines) that your doctor recommends. Ask your doctor about a flu shot and a pneumonia shot.  Use oxygen therapy and therapy to help improve your lungs (pulmonary rehabilitation) if told by your doctor. If you need home oxygen therapy, ask your doctor if you should buy a tool to measure your oxygen level (oximeter).  Make a COPD action plan with your doctor. This helps you know what to do if you feel worse than usual.  Manage any other conditions you have as told by your doctor.  Avoid going outside when it is very hot, cold, or humid.  Avoid people who have a sickness you can catch (contagious).  Keep all follow-up visits as told by your doctor. This is important. Contact a doctor if:  You cough up more mucus than usual.  There is a change in the color or thickness of the mucus.  It is harder to breathe than usual.  Your breathing is faster than usual.  You have trouble sleeping.  You need to use your medicines more often than usual.  You have trouble doing your normal activities such as getting  dressed or walking around the house. Get help right away if:  You have shortness of breath while resting.  You have shortness of breath that stops you from: ? Being able to talk. ? Doing normal activities.  Your chest hurts for longer than 5 minutes.  Your skin color is more blue than usual.  Your pulse oximeter shows that you have low oxygen for longer than 5 minutes.  You have a fever.  You feel too tired to breathe normally. Summary  Chronic obstructive pulmonary disease (COPD) is a long-term lung problem.  The way your lungs work will never return to normal. Usually the condition gets worse over time. There are things you can do to keep yourself as healthy as possible.  Take over-the-counter and prescription medicines only as  told by your doctor.  If you smoke, stop. Smoking makes the problem worse. This information is not intended to replace advice given to you by your health care provider. Make sure you discuss any questions you have with your health care provider. Document Released: 02/02/2008 Document Revised: 01/22/2016 Document Reviewed: 04/12/2013 Elsevier Interactive Patient Education  2017 Elsevier Inc.       Bronchiectasis Bronchiectasis is a condition in which the airways (bronchi) are damaged and widened. This makes it difficult for the lungs to get rid of mucus. As a result, mucus gathers in the airways, and this often leads to lung infections. Infection can cause inflammation in the airways, which may further weaken and damage the bronchi. What are the causes? Bronchiectasis may be present at birth (congenital) or may develop later in life. Sometimes there is no apparent cause. Some common causes include:  Cystic fibrosis.  Recurrent lung infections (such as pneumonia, tuberculosis, or fungal infections).  Foreign bodies or other blockages in the lungs.  Breathing in fluid, food, or other foreign objects (aspiration).  What are the signs or  symptoms? Common symptoms include:  A daily cough that brings up mucus and lasts for more than 3 weeks.  Frequent lung infections (such as pneumonia, tuberculosis, or fungal infections).  Shortness of breath and wheezing.  Weakness and fatigue.  How is this diagnosed? Various tests may be done to help diagnose bronchiectasis. Tests may include:  Chest X-rays or CT scans.  Breathing tests to help determine how your lungs are working.  Sputum cultures to check for infection.  Blood tests and other tests to check for related diseases or causes, such as cystic fibrosis.  How is this treated? Treatment varies depending on the severity of the condition. Medicines may be given to loosen the mucus to be coughed up (expectorants), to relax the muscles of the air passages (bronchodilators), or to prevent or treat infections (antibiotics). Physical therapy methods may be recommended to help clear mucus from the lungs. For severe cases, surgery may be done to remove the affected part of the lung. Follow these instructions at home:  Get plenty of rest.  Only take over-the-counter or prescription medicines as directed by your health care provider. If antibiotic medicines were prescribed, take them as directed. Finish them even if you start to feel better.  Avoid sedatives and antihistamines unless otherwise directed by your health care provider. These medicines tend to thicken the mucus in the lungs.  Perform any breathing exercises or techniques to clear the lungs as directed by your health care provider.  Drink enough fluids to keep your urine clear or pale yellow.  Consider using a cold steam vaporizer or humidifier in your room or home to help loosen secretions.  If the cough is worse at night, try sleeping in a semi-upright position in a recliner or using a couple of pillows.  Avoid cigarette smoke and lung irritants. If you smoke, quit.  Stay inside when pollution and ozone levels  are high.  Stay current with vaccinations and immunizations.  Follow up with your health care provider as directed. Contact a health care provider if:  You cough up more thick, discolored mucus (sputum) that is yellow to green in color.  You have a fever or persistent symptoms for more than 2-3 days.  You cannot control your cough and are losing sleep. Get help right away if:  You cough up blood.  You have chest pain or increasing shortness of breath.  You  have pain that is getting worse or is uncontrolled with medicines.  You have a fever and your symptoms suddenly get worse. This information is not intended to replace advice given to you by your health care provider. Make sure you discuss any questions you have with your health care provider. Document Released: 06/13/2007 Document Revised: 01/28/2016 Document Reviewed: 02/21/2013 Elsevier Interactive Patient Education  2017 [employer].

## 2018-05-26 NOTE — Telephone Encounter (Signed)
Spoke with pt and advised that Kimberly Downs will be able to go over CT results with her this afternoon at her appt.  Pt verbalized understanding.  Nothing further needed at this time.

## 2018-05-28 NOTE — Progress Notes (Signed)
Reviewed, agree 

## 2018-05-29 ENCOUNTER — Other Ambulatory Visit: Payer: Self-pay | Admitting: Acute Care

## 2018-05-29 DIAGNOSIS — F17210 Nicotine dependence, cigarettes, uncomplicated: Secondary | ICD-10-CM

## 2018-05-29 DIAGNOSIS — Z87891 Personal history of nicotine dependence: Secondary | ICD-10-CM

## 2018-05-29 DIAGNOSIS — Z122 Encounter for screening for malignant neoplasm of respiratory organs: Secondary | ICD-10-CM

## 2018-06-09 ENCOUNTER — Telehealth: Payer: Self-pay | Admitting: Adult Health

## 2018-06-09 LAB — CUP PACEART REMOTE DEVICE CHECK
Battery Remaining Longevity: 134 mo
Battery Voltage: 3.04 V
HighPow Impedance: 75 Ohm
Implantable Lead Implant Date: 20181128
Implantable Lead Location: 753860
Implantable Pulse Generator Implant Date: 20181128
Lead Channel Pacing Threshold Amplitude: 1 V
Lead Channel Pacing Threshold Pulse Width: 0.4 ms
Lead Channel Setting Pacing Amplitude: 2 V
Lead Channel Setting Pacing Pulse Width: 0.4 ms
MDC IDC MSMT LEADCHNL RV IMPEDANCE VALUE: 266 Ohm
MDC IDC MSMT LEADCHNL RV IMPEDANCE VALUE: 361 Ohm
MDC IDC MSMT LEADCHNL RV SENSING INTR AMPL: 11.375 mV
MDC IDC MSMT LEADCHNL RV SENSING INTR AMPL: 11.375 mV
MDC IDC SESS DTM: 20190918041605
MDC IDC SET LEADCHNL RV SENSING SENSITIVITY: 0.3 mV
MDC IDC STAT BRADY RV PERCENT PACED: 0.01 %

## 2018-06-09 NOTE — Telephone Encounter (Signed)
Sob, wieght 1.8 lbs in 1 day   Pt c/o swelling: STAT is pt has developed SOB within 24 hours  1) How much weight have you gained and in what time span? 1.8 lb in one day  2) If swelling, where is the swelling located? In hands and legs  3) Are you currently taking a fluid pill? yes  4) Are you currently SOB? yes  5) Do you have a log of your daily weights (if so, list)?   6) Have you gained 3 pounds in a day or 5 pounds in a week? no  7) Have you traveled recently? no

## 2018-06-09 NOTE — Telephone Encounter (Signed)
Spoke with pt after receiving a message from [facility]. Weigh gain of 1.8lb in a day Pt sts that her weight was up 5-6 lbs last week around 110lb. She weighs herself twice daily. In the morning and late afternoon. She denies increase sodium or fluid intake. It the a.m. Her weight is normally 102-103lbs. In the p.m. 105-106lb. Pt sts that her LE swelling has improved since last week. She tries to elevate her legs as much as she can. She has compression stockings at home, recommended that she wear them daily. Her sob is stable, she denies orthopnea, cough. Her weight this morning was 105lb.   Adv pt that I will fwd an update to Jory Sims, D-NP and we will call her if any additional recommendations are needed. Pt agreeable.

## 2018-06-11 NOTE — Telephone Encounter (Signed)
Thank you :)

## 2018-06-23 ENCOUNTER — Ambulatory Visit: Payer: Medicare Other | Admitting: Pulmonary Disease

## 2018-07-07 ENCOUNTER — Other Ambulatory Visit (HOSPITAL_BASED_OUTPATIENT_CLINIC_OR_DEPARTMENT_OTHER): Payer: Self-pay | Admitting: Family Medicine

## 2018-07-07 DIAGNOSIS — G4489 Other headache syndrome: Secondary | ICD-10-CM

## 2018-07-11 ENCOUNTER — Ambulatory Visit (HOSPITAL_BASED_OUTPATIENT_CLINIC_OR_DEPARTMENT_OTHER): Payer: Medicare Other

## 2018-07-12 ENCOUNTER — Other Ambulatory Visit: Payer: Self-pay | Admitting: Family Medicine

## 2018-07-12 DIAGNOSIS — R51 Headache: Principal | ICD-10-CM

## 2018-07-12 DIAGNOSIS — R519 Headache, unspecified: Secondary | ICD-10-CM

## 2018-07-13 ENCOUNTER — Ambulatory Visit
Admission: RE | Admit: 2018-07-13 | Discharge: 2018-07-13 | Disposition: A | Discharge status: Home / Self Care | Payer: Medicare Other | Source: Ambulatory Visit | Attending: Family Medicine | Admitting: Family Medicine

## 2018-07-13 ENCOUNTER — Encounter: Payer: Self-pay | Admitting: Neurology

## 2018-07-13 DIAGNOSIS — R51 Headache: Principal | ICD-10-CM

## 2018-07-13 DIAGNOSIS — R519 Headache, unspecified: Secondary | ICD-10-CM

## 2018-07-19 ENCOUNTER — Telehealth: Payer: Self-pay

## 2018-07-19 NOTE — Telephone Encounter (Signed)
Spoke with patient. Per a verbal from Dr. Tamala Julian, he recommends that patient be seen by neurology instead of concussion clinic. Also recommended to patient to follow up with PCP before neurology appointment. Patient voices understanding.

## 2018-07-19 NOTE — Telephone Encounter (Signed)
Error

## 2018-07-31 ENCOUNTER — Telehealth: Payer: Self-pay | Admitting: Neurology

## 2018-07-31 NOTE — Telephone Encounter (Addendum)
Spoke with patient and discussed that the chart says she has upcoming appointments with two other doctors, one with Dr. Lynnette Caffey & an NP [facility] Neurology) & the other appt is 09/27/18 with Dr. Delice Lesch [facility] neurology). Pt stated that she had no idea about either of these appointments but she said she will keep it as RN informed her that the one with [facility] Dr. Lynnette Caffey is actually tomorrow. Pt stated that her most recent hospitalization was in October. She had been having falls for awhile and said she was told her they thought she was having minor seizures and. She was given the phone numbers both for [facility] & Dr. Amparo [place] office. Pt verbalized appreciation and did not express any further concerns before the call ended.

## 2018-07-31 NOTE — Telephone Encounter (Signed)
Patient called and stated that she has been experiencing falls recently and would like to speak with the nurse regarding these issues. She states that this is an emergency. I informed her if she feels that she is in danger or it is an emergency she needs to go to ED. She just got out of the ED. Please call and advise.

## 2018-08-01 ENCOUNTER — Ambulatory Visit: Payer: Medicare Other | Admitting: Neurology

## 2018-08-01 ENCOUNTER — Encounter: Payer: Self-pay | Admitting: Neurology

## 2018-08-01 ENCOUNTER — Other Ambulatory Visit: Payer: Self-pay

## 2018-08-01 VITALS — BP 102/60 | HR 67 | Ht 59.0 in | Wt 103.0 lb

## 2018-08-01 DIAGNOSIS — F0781 Postconcussional syndrome: Secondary | ICD-10-CM | POA: Diagnosis not present

## 2018-08-01 DIAGNOSIS — R55 Syncope and collapse: Secondary | ICD-10-CM | POA: Diagnosis not present

## 2018-08-01 MED ORDER — AMITRIPTYLINE HCL 10 MG PO TABS: 10.0000 mg | ORAL_TABLET | Freq: Every day | ORAL | 11 refills | Status: DC

## 2018-08-01 MED ORDER — MECLIZINE HCL 12.5 MG PO TABS: 12.5000 mg | ORAL_TABLET | Freq: Three times a day (TID) | ORAL | 11 refills | Status: DC | PRN
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
## 2018-08-01 NOTE — Telephone Encounter (Signed)
Noted. According to pt's chart, pt has already arrived to Dr. Amparo Bristol office today for an appointment with her.   Dr. Jaynee Eagles aware.

## 2018-08-01 NOTE — Patient Instructions (Signed)
1. Schedule 24-hour EEG 2. Start amitriptyline 10mg : take 1 tablet every night 3. May take meclizine 12.5mg  every 8 hours as needed for dizziness 4. Continue Keppra 500mg  twice a day 5. Ask your Physical Therapist about doing Vestibular therapy to help with the dizziness 6. We will request a copy of your recent bloodwork from Dr. Marin Comment and order additional bloodwork if needed 7. Follow-up in 3 months, call for any changes

## 2018-08-01 NOTE — Telephone Encounter (Signed)
Dr. Delice Lesch is an epilepsy specialist so I would tell her to keep that appointment. Thank you

## 2018-08-03 ENCOUNTER — Encounter: Payer: Self-pay | Admitting: Neurology

## 2018-08-03 ENCOUNTER — Ambulatory Visit (INDEPENDENT_AMBULATORY_CARE_PROVIDER_SITE_OTHER): Payer: Medicare Other | Admitting: Cardiovascular Disease

## 2018-08-03 ENCOUNTER — Encounter: Payer: Self-pay | Admitting: Cardiovascular Disease

## 2018-08-03 VITALS — BP 122/58 | HR 70 | Ht 59.0 in | Wt 106.0 lb

## 2018-08-03 DIAGNOSIS — R55 Syncope and collapse: Secondary | ICD-10-CM

## 2018-08-03 DIAGNOSIS — E119 Type 2 diabetes mellitus without complications: Secondary | ICD-10-CM

## 2018-08-03 DIAGNOSIS — I5042 Chronic combined systolic (congestive) and diastolic (congestive) heart failure: Secondary | ICD-10-CM

## 2018-08-03 DIAGNOSIS — J449 Chronic obstructive pulmonary disease, unspecified: Secondary | ICD-10-CM | POA: Diagnosis not present

## 2018-08-03 DIAGNOSIS — I480 Paroxysmal atrial fibrillation: Secondary | ICD-10-CM

## 2018-08-03 DIAGNOSIS — E7800 Pure hypercholesterolemia, unspecified: Secondary | ICD-10-CM

## 2018-08-03 DIAGNOSIS — I2510 Atherosclerotic heart disease of native coronary artery without angina pectoris: Secondary | ICD-10-CM

## 2018-08-03 DIAGNOSIS — I10 Essential (primary) hypertension: Secondary | ICD-10-CM | POA: Diagnosis not present

## 2018-08-03 DIAGNOSIS — R636 Underweight: Secondary | ICD-10-CM

## 2018-08-03 DIAGNOSIS — Z95810 Presence of automatic (implantable) cardiac defibrillator: Secondary | ICD-10-CM

## 2018-08-03 DIAGNOSIS — I739 Peripheral vascular disease, unspecified: Secondary | ICD-10-CM

## 2018-08-03 NOTE — Patient Instructions (Signed)
Medication Instructions:  Dr Sallyanne Kuster has recommended making the following medication changes: 1. STOP Metoprolol 2. STOP Isosorbide 3. STOP Pentoxifylline (Trental)  If you need a refill on your cardiac medications before your next appointment, please call your pharmacy.   Testing/Procedures: Remote monitoring is used to monitor your Pacemaker of ICD from home. This monitoring reduces the number of office visits required to check your device to one time per year. It allows [place] to keep an eye on the functioning of your device to ensure it is working properly. You are scheduled for a device check from home on Wednesday, December 18th, 2019. You may send your transmission at any time that day. If you have a wireless device, the transmission will be sent automatically. After your physician reviews your transmission, you will receive a postcard with your next transmission date.  Follow-Up: At [facility], you and your health needs are our priority.  As part of our continuing mission to provide you with exceptional heart care, we have created designated Provider Care Teams.  These Care Teams include your primary Cardiologist (physician) and Advanced Practice Providers (APPs -  Physician Assistants and Nurse Practitioners) who all work together to provide you with the care you need, when you need it. You will need a follow up appointment in 6 months.  Please call our office 2 months in advance to schedule this appointment.  You may see [name], MD or one of the following Advanced Practice Providers on your designated Care Team:   Kerin Ransom, PA-C Roby Lofts, [place] . Sande Rives, PA-C  Dr Croitoru recommends that you schedule a follow-up appointment in 12 months with an ICD check. You will receive a reminder letter in the mail two months in advance. If you don't receive a letter, please call our office to schedule the follow-up appointment.
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
## 2018-08-06 DIAGNOSIS — E119 Type 2 diabetes mellitus without complications: Secondary | ICD-10-CM | POA: Insufficient documentation

## 2018-08-06 DIAGNOSIS — I739 Peripheral vascular disease, unspecified: Secondary | ICD-10-CM | POA: Insufficient documentation

## 2018-08-16 ENCOUNTER — Ambulatory Visit: Payer: Medicare Other

## 2018-08-16 ENCOUNTER — Telehealth: Payer: Self-pay

## 2018-08-16 NOTE — Telephone Encounter (Signed)
Spoke with pt and reminded pt of remote transmission that is due today. Pt verbalized understanding.   

## 2018-08-17 ENCOUNTER — Encounter: Payer: Self-pay | Admitting: Cardiology

## 2018-08-17 ENCOUNTER — Ambulatory Visit (INDEPENDENT_AMBULATORY_CARE_PROVIDER_SITE_OTHER): Payer: Medicare Other

## 2018-08-17 DIAGNOSIS — I469 Cardiac arrest, cause unspecified: Secondary | ICD-10-CM | POA: Diagnosis not present

## 2018-08-18 NOTE — Progress Notes (Signed)
Remote ICD transmission.   

## 2018-09-08 ENCOUNTER — Telehealth: Payer: Self-pay | Admitting: Cardiovascular Disease

## 2018-09-08 NOTE — Telephone Encounter (Signed)
Did not need this encounter °

## 2018-09-11 ENCOUNTER — Telehealth: Payer: Self-pay | Admitting: Cardiology

## 2018-09-11 NOTE — Telephone Encounter (Signed)
I agree with her plan, but please ask her to take an extra KCl 20 mEq (over her usual amount) every day when taking the extra diuretic. Please call us back with weight on Wednesday. MCr

## 2018-09-11 NOTE — Telephone Encounter (Signed)
Advised patient, verbalized understanding  

## 2018-09-11 NOTE — Telephone Encounter (Signed)
New Message        Pt c/o swelling: STAT is pt has developed SOB within 24 hours  1) How much weight have you gained and in what time span? 12 pds  2) If swelling, where is the swelling located? Feet/ankle  3) Are you currently taking a fluid pill? Yes  4) Are you currently SOB? Yes  5) Do you have a log of your daily weights (if so, list)? Yes  6) Have you gained 3 pounds in a day or 5 pounds in a week? Yes  7) Have you traveled recently? No

## 2018-09-11 NOTE — Telephone Encounter (Signed)
Spoke with patient and her weight normally runs around 102-104. She has been trying to eat more but noticed her weight this morning was 114, up over the last 12 days. She is having some lower extremity swelling and shortness of breath. Patient had been told in the past by her PCP who has retired to take an extra Torsemide 20 mg daily for 3 days when this happens. Patient plans to take an extra Torsemide today and would prefer taking extra to see if improvement before coming in for visit. Will forward to Dr Sallyanne Kuster for review.

## 2018-09-17 LAB — CUP PACEART REMOTE DEVICE CHECK
Brady Statistic RV Percent Paced: 0.01 %
Date Time Interrogation Session: 20191219231335
HIGH POWER IMPEDANCE MEASURED VALUE: 77 Ohm
Lead Channel Impedance Value: 304 Ohm
Lead Channel Impedance Value: 399 Ohm
Lead Channel Sensing Intrinsic Amplitude: 10.5 mV
Lead Channel Sensing Intrinsic Amplitude: 10.5 mV
Lead Channel Setting Sensing Sensitivity: 0.3 mV
MDC IDC LEAD IMPLANT DT: 20181128
MDC IDC LEAD LOCATION: 753860
MDC IDC MSMT BATTERY REMAINING LONGEVITY: 133 mo
MDC IDC MSMT BATTERY VOLTAGE: 3.01 V
MDC IDC MSMT LEADCHNL RV PACING THRESHOLD AMPLITUDE: 0.75 V
MDC IDC MSMT LEADCHNL RV PACING THRESHOLD PULSEWIDTH: 0.4 ms
MDC IDC PG IMPLANT DT: 20181128
MDC IDC SET LEADCHNL RV PACING AMPLITUDE: 2 V
MDC IDC SET LEADCHNL RV PACING PULSEWIDTH: 0.4 ms

## 2018-09-19 LAB — CUP PACEART INCLINIC DEVICE CHECK
Implantable Pulse Generator Implant Date: 20181128
MDC IDC LEAD IMPLANT DT: 20181128
MDC IDC LEAD LOCATION: 753860
MDC IDC SESS DTM: 20200121162234

## 2018-09-25 ENCOUNTER — Telehealth: Payer: Self-pay | Admitting: Cardiovascular Disease

## 2018-09-25 NOTE — Telephone Encounter (Signed)
Spoke to patient she states she feels  Short ness of breath I worse today- patient did go to primary  last week -- received something for cough. Patient states weight today 109. She states weight is usually 108-109 each day. Patient has only taken  One dose of torsemide today at 10 am.  Patient was attending to take another dose at 5 pm today. RN  INFORMED PATIENT to take medication between 3 pm to 4 pm today and to take an extra potassium as suggest at last  Telephone message . Patient verbalized understanding and verbalized if swelling or shortness of breathe persist to contact office.  the report that is mention earlier is not available

## 2018-09-25 NOTE — Telephone Encounter (Signed)
New message \   Pt c/o Shortness Of Breath: STAT if SOB developed within the last 24 hours or pt is noticeably SOB on the phone  1. Are you currently SOB (can you hear that pt is SOB on the phone)? Yes , worse than usual   2. How long have you been experiencing SOB? Today   3. Are you SOB when sitting or when up moving around? Moving around   4. Are you currently experiencing any other symptoms? Swelling in hands and cough     She stated that she will fax over report to [phone]

## 2018-09-27 ENCOUNTER — Telehealth: Payer: Self-pay

## 2018-09-27 ENCOUNTER — Encounter

## 2018-09-27 ENCOUNTER — Ambulatory Visit: Payer: Medicare Other | Admitting: Neurology

## 2018-09-27 NOTE — Telephone Encounter (Signed)
Spoke with pt relaying message below. She states that she has not heard about scheduling EEG.  I advised that she will receive call from Manuela Schwartz to schedule.

## 2018-09-27 NOTE — Telephone Encounter (Signed)
Received call from pt who states that she believes she is experiencing slight seizures.  When asked to go into detail pt states that her head hurts, but it is not a headache - actual places on her head ache to the touch.  States that she has been wearing a helmet around her house in case she falls again, so she will not hit her head.  I advised that I would send message to Dr. Delice Lesch and return call with any recommendations.  Pt states taking all medications as directed.

## 2018-09-27 NOTE — Telephone Encounter (Signed)
Let's try increasing the Keppra (Levetiracetam) 500mg : Take 1 tab in AM, 1.5 tab in PM and see how she feels. Would proceed with the 24-hour EEG we had discussed to see if she is having seizures. Thanks

## 2018-09-28 ENCOUNTER — Ambulatory Visit (INDEPENDENT_AMBULATORY_CARE_PROVIDER_SITE_OTHER): Payer: Medicare Other | Admitting: Neurology

## 2018-09-28 DIAGNOSIS — R55 Syncope and collapse: Secondary | ICD-10-CM | POA: Diagnosis not present

## 2018-10-01 ENCOUNTER — Telehealth: Payer: Self-pay | Admitting: Nurse Practitioner

## 2018-10-01 NOTE — Telephone Encounter (Signed)
   Received call from pts HHRN re: wt gain.  I called number back and spoke directly to pt.  She reports a 4-5 lbs wt gain over the past 2 days w/ hand and leg swelling.  She has chronic DOE in the setting of COPD and doesn't think she's any worse.  I rec that she take an additional 20mg  of torsemide this AM and to be sure that she's avoid processed foods.  She is to call back tomorrow if wt has not come back down or swelling/symptoms worse.  Caller verbalized understanding and was grateful for the call back.  Murray Hodgkins, NP 10/01/2018, 11:47 AM

## 2018-10-06 NOTE — Procedures (Signed)
ELECTROENCEPHALOGRAM REPORT  Dates of Recording: 09/28/2018 11:44AM to 09/29/2018 11:57AM  Patient's Name: Kimberly Downs MRN: [id number] Date of Birth: 04-06-1946  Referring Provider: Dr. Ellouise Newer  Procedure: 24-hour ambulatory EEG  History: This is a 73 year old woman with recurrent falls with loss of consciousness  Medications:  PROVENTIL (2.5 MG/3ML) 0.083% nebulizer solution  aspirin EC 81 MG tablet    LIPITOR 10 MG tablet   LIORESAL 10 MG tablet  TESSALON 200 MG capsule  SYMBICORT 160-4.5 MCG/ACT inhaler  VIBRA-TABS 100 MG tablet  SLOW RELEASE IRON 160 (50 Fe) MG TBCR SR tablet   TRELEGY ELLIPTA 100-62.5-25 MCG/INH AEPB  HYDROMET 5-1.5 MG/5ML syrup  AVAPRO 75 MG tablet  IMDUR 30 MG 24 hr tablet  ANTIVERT 12.5 MG tablet  LOPRESSOR 25 MG tablet  PROTONIX 40 MG tablet   TRENTAL 400 MG CR tablet  K-DUR,KLOR-CON 20 MEQ tablet  DELTASONE 10 MG tablet   FLUTTER DEVI  REQUIP 2 MG tablet   DEMADEX 20 MG tablet  ULTRAM 50 MG tablet  Technical Summary: This is a 24-hour multichannel digital EEG recording measured by the international 10-20 system with electrodes applied with paste and impedances below 5000 ohms performed as portable with EKG monitoring.  The digital EEG was referentially recorded, reformatted, and digitally filtered in a variety of bipolar and referential montages for optimal display.    DESCRIPTION OF RECORDING: During maximal wakefulness, the background activity consisted of a symmetric 9 Hz posterior dominant rhythm which was reactive to eye opening. There is slight asymmetry in the background with higher voltage activity over the right frontocentral region. There was occasional focal 4-5 Hz theta slowing over the right frontal region. There were no epileptiform discharges seen in wakefulness.  During the recording, the patient progresses through wakefulness, drowsiness, and Stage 2 sleep. Similar voltage asymmetry on the right hemisphere is  seen, at times sharply contoured without clear epileptogenic potential. Again, there were no clear epileptiform discharges seen.  Events: On 1/30 at 1630 hours, she reprots a lot of head pain. Electrographically, there were no EEG or EKG changes seen.  There were no electrographic seizures seen.  EKG lead was unremarkable.  IMPRESSION: This 24-hour ambulatory EEG study is abnormal due to the presence of: 1. Occasional focal slowing over the right frontal region 2. Slight voltage asymmetry with higher voltage activity over the right frontocentral region  CLINICAL CORRELATION of the above findings indicates focal cerebral dysfunction over the right frontal region. Query breach artifact potentially causing asymmetry noted above. The absence of epileptiform discharges does not exclude a clinical diagnosis of epilepsy. Typical events were not captured.  If further clinical questions remain, inpatient video EEG monitoring may be helpful.   Ellouise Newer, M.D.

## 2018-10-09 ENCOUNTER — Telehealth: Payer: Self-pay

## 2018-10-09 NOTE — Telephone Encounter (Signed)
It is fine to take Keppra with her other medications. Is she still taking the amitriptyline 10mg  that I prescribed on initial visit? This was the medication to help reduce the headaches, it is a low dose, if no side effects, would increase to 20mg  qhs. Pls let her know though that taking oxycodone on a daily basis can actually worsen headaches. Recommendation is to only take it 2-3 times a week, otherwise headaches will be harder to treat. Thanks

## 2018-10-09 NOTE — Telephone Encounter (Signed)
Spoke with pt relaying results below.  Pt states that she is experiencing pain in her head - but not headache.  "feels like someone hit me with a hammer, then it spreads and goes away.  Then BAM, feels like I got hit with a hammer in a different spot.  It spreads and goes away."  Pt states that there are 4 "spots" that feel this way. States that she is taking 2.5mg  Oxycodone and that "just barely takes the edge off".  I asked for clarification on Oxycodone dose as our records indicate 5mg  tabs.  Pt states that Dr who prescribed it does not feel comfortable with pt taking 5mg  as well as Keppra, and that pt is to speak with Dr. Delice Lesch about this.  Pt goes on to say that her dizzy spells are getting worse, but could not give any detail.

## 2018-10-09 NOTE — Telephone Encounter (Signed)
-----   Message from Cameron Sprang, MD sent at 10/06/2018  4:44 PM EST ----- Pls see how she is feeling on the higher dose Keppra. The 24-hour EEG did not show any seizure discharges. Continue on Keppra and keep a calendar of her symptoms until her f/u with me next month. Thanks

## 2018-10-10 NOTE — Telephone Encounter (Signed)
Spoke with pt.  She states that she is taking Amitriptyline 10mg  QHS.  Pt complained that she was "given the wrong medication" in regards to the Amitriptyline because "it's for depression and I don't have depression"  I advised that although Amitriptyline is prescribed (for some) for depression, it is also prescribed for nerve pain.  I advised pt to limit Oxycodone to 2-3x/week, to which she replied "well how else am I suppose to get rid of this pain?  It's the only thing that takes the edge off.  I don't know if you have ever been hit in the head with a hammer, but it HURTS."  I advised that continuing to take Oxycodone like this will make it hard to actually treat her headaches.  Pt corrected me, as she does not have "headaches" but has "pain in her head".  Pt agreeable to increasing Amitriptyline.  I advised that I will call her later this week to f/u and see how she is doing.  If doing better I advised we may send new Rx with increase.   Pt expressed that she would not reduce Oxycodone "it's only 2.5mg "

## 2018-10-13 ENCOUNTER — Other Ambulatory Visit: Payer: Self-pay | Admitting: Family Medicine

## 2018-10-13 DIAGNOSIS — M810 Age-related osteoporosis without current pathological fracture: Secondary | ICD-10-CM

## 2018-10-13 DIAGNOSIS — Z780 Asymptomatic menopausal state: Secondary | ICD-10-CM

## 2018-10-16 ENCOUNTER — Telehealth: Payer: Self-pay | Admitting: Neurology

## 2018-10-16 ENCOUNTER — Telehealth: Payer: Self-pay | Admitting: Pulmonary Disease

## 2018-10-16 MED ORDER — FLUTICASONE-UMECLIDIN-VILANT 100-62.5-25 MCG/INH IN AEPB: 1.0000 | INHALATION_SPRAY | Freq: Every day | RESPIRATORY_TRACT | 0 refills | Status: DC

## 2018-10-16 NOTE — Telephone Encounter (Signed)
Called and spoke with Patient.  She stated that she has Trelegy and Symbicort. Asked if she used both and she stated that she did not.  She was finishing her Symbicort when she ran out of Trelegy.  Explained that we do not have Combivent samples, but I would placed Trelegy samples at front desk. Understanding stated.  Trelegy samples with patient assistance forms placed at for desk for Patient pick up.  Nothing further at this time. Per Wyn Quaker, NP 05/26/18- Instructions      Return in about 4 weeks (around 06/23/2018), or if symptoms worsen or fail to improve, for Follow up with [name]-C.  Trelegy Ellipta  >>> 1 puff daily in the morning >>>rinse mouth out after use  >>> This inhaler contains 3 medications that help manage her respiratory status, contact our office if you cannot afford this medication or unable to remain on this medication >>>Use this in place of Symbicort

## 2018-10-16 NOTE — Telephone Encounter (Signed)
Patient wants to know if she can stop keppra  Since she is not having seizures and test came back negative please call

## 2018-10-20 MED ORDER — AMITRIPTYLINE HCL 10 MG PO TABS: 20.0000 mg | ORAL_TABLET | Freq: Every day | ORAL | 3 refills | Status: DC

## 2018-10-20 NOTE — Telephone Encounter (Signed)
Amitriptyline 10mg  #180 with 3 refills Sig = take 2 tab QHS  Sent to [facility]

## 2018-10-20 NOTE — Telephone Encounter (Signed)
Patient calling back regarding her keppra. Please Call. Thanks

## 2018-10-20 NOTE — Telephone Encounter (Signed)
Spoke with pt advising that per our last conversation, yes, she is to continue Keppra 500mg  daily.  Pt states that PCP asked her to ask Dr. Delice Lesch if she should still continue because it could cause problems taking along with increased dose of Amitriptyline.  I advised that Dr. Delice Lesch prescribes both of these medications and there is no known contraindication taking them together.  I advised that protocol for Keppra is 2 years.  pt states that PCP "was overly concerned " about her taking both.  I advised that all of the information I relayed is readily available to physicians and that maybe her PCP should do a little research on Neuro medications.  Pt asks about pain medications.  I advised that our office does not prescribe anything stronger than Tramadol.  Pt states that she has Tramadol on hand and curious if she could take.  I advised that she could do whatever she pleased, however if she takes Tramadol it will make treating her headaches harder.  Pt states that she does not take Tramadol for headaches, but takes because she has chronic pain due to multiple surgeries and procedures.  I advised that it does not matter why she takes pain meds, taking pain meds will interfere with treatment of headache.  Pt does not seem to understand this as she stated "well I guess I will just have to find someone who IS willing to prescribe something because I cannot function when I am in pain"  Pt then went on to states that increasing Amitriptyline has made a world of difference and that she feels like a completely different person.  Asks that I send 90 day Rx to [facility].

## 2018-10-23 ENCOUNTER — Telehealth: Payer: Self-pay | Admitting: Cardiovascular Disease

## 2018-10-23 NOTE — Telephone Encounter (Signed)
Spoke with pt, she is up weight and wants to make sure she can take an extra torsemide if needed on days like today. Okay given for her to take an extra torsemide but to make sure she only does 1 or 2 times a week. Patient voiced understanding to call if she is having to take it more often because of her kidney function.

## 2018-10-23 NOTE — Telephone Encounter (Signed)
Pt c/o swelling: STAT is pt has developed SOB within 24 hours  1) How much weight have you gained and in what time span? 4.2lbs in 6 days  2) If swelling, where is the swelling located? Hands and feets  3) Are you currently taking a fluid pill? yes  4) Are you currently SOB? Yes, more than normal   5) Do you have a log of your daily weights (if so, list)? no  6) Have you gained 3 pounds in a day or 5 pounds in a week? no  7) Have you traveled recently? no

## 2018-10-25 ENCOUNTER — Ambulatory Visit (INDEPENDENT_AMBULATORY_CARE_PROVIDER_SITE_OTHER): Payer: Medicare Other

## 2018-10-25 DIAGNOSIS — M810 Age-related osteoporosis without current pathological fracture: Secondary | ICD-10-CM

## 2018-10-25 DIAGNOSIS — Z780 Asymptomatic menopausal state: Secondary | ICD-10-CM

## 2018-11-07 ENCOUNTER — Telehealth: Payer: Self-pay | Admitting: Pulmonary Disease

## 2018-11-07 NOTE — Telephone Encounter (Signed)
Called and spoke with Judeen Hammans. Judeen Hammans stated that the patient has been calling their office very concerned and confused. Per Judeen Hammans the patient has stated that she cannot afford her symbicort and her combivent inhalers. She also stated that the Trelegy inhalers do not work for her they make her cough worse. Patient has also stated to [facility] and Dr. Audree Bane that our office is no longer prescribing her medications and that she now has to get them from them. Patient also stated that she was referred to pulmonary rehab and they told her she was not a candidate for this. (we referred her back on 05/29/18 to [facility]). I do see from previous visits here with Aaron Edelman that the patient was taken off of Symbicort and placed on Trelegy. The combivent inhaler was to use as a rescue and not daily if not needed. The patient ws also given patient assistance forms from our office for the Trelegy inhaler but we never got them back. (no documentation and they were taken to Dr. Rivka Spring office for them to fill out per Judeen Hammans). Judeen Hammans stated that the patient says she does not take the Trelegy or any other inhalers however we do have a phone note from the patient on 10/16/18 stating she needed samples of both the Trelegy and Symbicort and LT explained to her she only needed to take the Trelegy and not both. Patient verbalized understanding during that conversation.  I have called patient to clarify all of this above. I was not able to reach patient so I have left a message to give [place] a call back so that we can get her into the office and get her on track to make sure she is doing what she needs to to benefit her health. I have also called pulm rehab with [facility] and left a message for them to contact us to check to see why patient was not a candidate.   Called Sherry back with Dr. Audree Bane and let her know all of this. Will await return call from patient.
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
## 2018-11-09 ENCOUNTER — Encounter: Payer: Self-pay | Admitting: Pulmonary Disease

## 2018-11-09 ENCOUNTER — Ambulatory Visit: Payer: Medicare Other | Admitting: Pulmonary Disease

## 2018-11-09 ENCOUNTER — Other Ambulatory Visit: Payer: Self-pay

## 2018-11-09 VITALS — BP 118/66 | HR 91 | Temp 97.9°F | Ht 59.0 in | Wt 111.6 lb

## 2018-11-09 DIAGNOSIS — F17210 Nicotine dependence, cigarettes, uncomplicated: Secondary | ICD-10-CM | POA: Diagnosis not present

## 2018-11-09 DIAGNOSIS — J441 Chronic obstructive pulmonary disease with (acute) exacerbation: Secondary | ICD-10-CM

## 2018-11-09 DIAGNOSIS — Z79899 Other long term (current) drug therapy: Secondary | ICD-10-CM | POA: Diagnosis not present

## 2018-11-09 DIAGNOSIS — Z720 Tobacco use: Secondary | ICD-10-CM

## 2018-11-09 DIAGNOSIS — J449 Chronic obstructive pulmonary disease, unspecified: Secondary | ICD-10-CM

## 2018-11-09 MED ORDER — IPRATROPIUM-ALBUTEROL 0.5-2.5 (3) MG/3ML IN SOLN
3.0000 mL | Freq: Once | RESPIRATORY_TRACT | Status: AC
  Administered 2018-11-09: 3 mL via RESPIRATORY_TRACT

## 2018-11-09 MED ORDER — DOXYCYCLINE HYCLATE 100 MG PO TABS: 100.0000 mg | ORAL_TABLET | Freq: Two times a day (BID) | ORAL | 0 refills | Status: DC

## 2018-11-09 MED ORDER — PREDNISONE 10 MG PO TABS: ORAL_TABLET | ORAL | 0 refills | Status: DC

## 2018-11-09 NOTE — Assessment & Plan Note (Signed)
Assessment: September/2018 pulmonary function test showing COPD 2, DLCO 68 Patient is frequent exacerbate her mMRC 3 today Persistent confusion regarding medication management Patient reports she is either maintained on Trelegy Ellipta or Symbicort 160 Expiratory wheezes on exam today Patient reports issues with cost of inhalers September/2019 lung cancer screening shows cylindrical bronchiectasis and mild centrilobular and paraseptal emphysema  Plan: DuoNeb today in office Prednisone today Doxycycline today Resume Trelegy Ellipta with the samples you have at home You need to contact your insurance to figure out if you have a high deductible plan this year and if this is why you are having high cost regarding your medications We will see you back in 2 weeks to ensure we have a better plan with management of your medications Referral to [facility] and case management for management of COPD as well as medications Consider referral to pharmacist in office when this program is started Referral to pulmonary rehab

## 2018-11-09 NOTE — Assessment & Plan Note (Signed)
Assessment: mMRC 3 today Expiratory wheezes today  Plan: DuoNeb Prednisone Doxycycline Follow-up in 2 weeks

## 2018-11-09 NOTE — Patient Instructions (Addendum)
Duoneb today   Prednisone 10mg  tablet  >>>4 tabs for 2 days, then 3 tabs for 2 days, 2 tabs for 2 days, then 1 tab daily as prescribed >>>take with food  >>>take in the morning   Doxycycline >>> 1 100 mg tablet every 12 hours for 7 days >>>take with food  >>>wear sunscreen   Start Trelegy Ellipta  >>> 1 puff daily in the morning >>>rinse mouth out after use  >>> This inhaler contains 3 medications that help manage her respiratory status, contact our office if you cannot afford this medication or unable to remain on this medication   Call Lauren your HF RN with [facility] to ask about your medications and cost   We recommend that you stop smoking.  >>>You need to set a quit date >>>If you have friends or family who smoke, let them know you are trying to quit and not to smoke around you or in your living environment  Smoking Cessation Resources:  1 800 QUIT NOW  >>> Patient to call this resource and utilize it to help support her quit smoking >>> Keep up your hard work with stopping smoking  You can also contact the [facility] >>>For smoking cessation classes call [phone]  We do not recommend using e-cigarettes as a form of stopping smoking  You can sign up for smoking cessation support texts and information:  >>>https://smokefree.gov/smokefreetxt   Note your daily symptoms > remember "red flags" for COPD:   >>>Increase in cough >>>increase in sputum production >>>increase in shortness of breath or activity  intolerance.   If you notice these symptoms, please call the office to be seen.    FOLLOW UP WITH Leeza Heiner FNP IN 2 WEEKS   It is flu season:   >>> Best ways to protect herself from the flu: Receive the yearly flu vaccine, practice good hand hygiene washing with soap and also using hand sanitizer when available, eat a nutritious meals, get adequate rest, hydrate appropriately   Please contact the office if your symptoms worsen or you have  concerns that you are not improving.   Thank you for choosing [facility] for your healthcare, and for allowing [place] to partner with you on your healthcare journey. I am thankful to be able to provide care to you today.   [name]-C

## 2018-11-09 NOTE — Assessment & Plan Note (Addendum)
Assessment: Current every day smoker 3 to 7 cigarettes daily 87-pack-year smoking history Lung RADS 2S on September/2019 CT  Plan: Continue to reduce her cigarette consumption Her goal is to get to 0 cigarettes daily You need to set a quit date to stop smoking Avoid known trigger areas for smoking: [employer] [facility] Follow-up with our office in 2 weeks Pneumovax 23 at next office date Complete low-dose CT in September/2020

## 2018-11-09 NOTE — Assessment & Plan Note (Signed)
Assessment: Patient did not complete 4-week follow-up after last office visit as instructed Confusion regarding inhaler use and if she should be on Symbicort 160 or Trelegy Ellipta Concern over the cost of her medications  Plan: When pharmacy program is operating our office patient would be a great candidate for this Continue Trelegy Ellipta at this time, patient has 1 month of samples Patient needs to contact her insurance company and discuss in depth why her coverage is so poor for these medications, I suspect she has a high deductible plan We will need to consider [place] for you program once patient is spent $600 out of pocket for medications -to help with coverage of Trelegy Ellipta

## 2018-11-10 ENCOUNTER — Other Ambulatory Visit: Payer: Self-pay

## 2018-11-10 NOTE — Progress Notes (Signed)
Reviewed, agree 

## 2018-11-10 NOTE — Patient Outreach (Addendum)
[facility] [facility]) Care Management  11/10/2018  Kimberly Downs Jul 12, 1946 [id number]   Referral Date: 11/10/2018 Referral Source: MD referral Referral Reason: COPD education and medication assistance   Outreach Attempt: spoke with patient.  She is able to verify HIPAA.  Discussed with patient reason for referral.  She states that she is not able to afford her medications now.  She states that 4 of her medications was going to cost her about $1000 for 3 months and she states she cannot afford that.  She states she never had a problem with affording her medications and falling into the doughnut hole until her doctors changed her medications.  She states she has asked if there were some generic medications she could take instead but was told no.  Patient also states that she needs some education and support around her COPD.    Patient lives alone but has support of friends.  Patient is independent with all aspects of care and still drives.  She admits to COPD, DM, CHF, HTN, and arthritis.  She states she does have advanced directives.     Discussed [facility] services with patient. She is agreeable to [employer] and pharmacy.    Plan: RN CM will refer to health coach for disease management and support of COPD. RN CM will refer pharmacy for medication assistance.     Jone Baseman, RN, MSN [facility] Care Management Care Management Coordinator Direct Line [phone] Toll Free: [phone]  Fax: [phone]

## 2018-11-13 ENCOUNTER — Telehealth: Payer: Self-pay

## 2018-11-13 ENCOUNTER — Other Ambulatory Visit: Payer: Self-pay | Admitting: Pharmacist

## 2018-11-13 ENCOUNTER — Telehealth: Payer: Self-pay | Admitting: Pulmonary Disease

## 2018-11-13 NOTE — Telephone Encounter (Signed)
11/13/2018 0944  Was able to contact Berniece Andreas RN BSN CCM who is the patient's registered nurse case manager at [employer].  The patient works with Ander Purpura and her heart failure case management program which she weighs telephonically to monitor for heart failure exacerbations.  I discussed my concerns with Lauren regarding the patient's cost of medications and I believe she would benefit from speaking to a benefits care coordinator.  Lauren will place an order for that today so the patient can have a specialist look at her medications from [place] [facility].  As I believe she is in a high deductible plan and we need to further evaluate this.  Lauren knows to contact our office if she has any other concerns or questions.  I also notified Lauren that the patient has been referred to [facility] case management.  Nothing further needed at this time.  Wyn Quaker, FNP

## 2018-11-13 NOTE — Patient Outreach (Addendum)
Kimberly Downs [facility]) Care Management  [place]   11/13/2018  Kimberly Downs 01-30-1946 [id number]  Reason for referral: Medication Assistance  Referral source: Dr. Warner Mccreedy Current insurance:[employer]  PMHx includes but not limited to:  Current tobacco abuse, COPD, T2DM, HTN, HLD, CHF, PAF, PUD, arthritis, anxiety, CAD  Outreach:  Successful telephone call with Ms. Offord.  HIPAA identifiers verified.   Subjective:  Patient agreeable to review medications.  She states she was only on Tier 1 + 2 medications last year.  The inhalers and Januvia were all started this year and copays are much higher than expected.  Patient currently using samples of Trelegy (will last 2-4 weeks) and Combivent (1 week left).  She has not been able to start Januvia due to cost.    Objective: Lab Results  Component Value Date   CREATININE 1.16 (H) 05/03/2018   CREATININE 0.95 12/23/2017   CREATININE 1.06 (H) 12/05/2017    Lab Results  Component Value Date   HGBA1C 7.0 (H) 01/27/2017    Lipid Panel     Component Value Date/Time   CHOL 131 04/04/2016 0823   TRIG 39 04/04/2016 0823   HDL 57 04/04/2016 0823   CHOLHDL 2.3 04/04/2016 0823   VLDL 8 04/04/2016 0823   LDLCALC 66 04/04/2016 0823    BP Readings from Last 3 Encounters:  11/09/18 118/66  08/03/18 (!) 122/58  08/01/18 102/60    Allergies  Allergen Reactions  . Gabapentin Other (See Comments)    Passed out.  Kimberly Downs [Pregabalin] Other (See Comments)    "almost died" confusion, syncope, hypertension  . Dicyclomine Other (See Comments)    Reaction:  Agitation  Other reaction(s): Agitation  . Ibuprofen Nausea And Vomiting  . Morphine And Related Nausea And Vomiting  . Nsaids Nausea And Vomiting  . Toradol [Ketorolac Tromethamine] Nausea And Vomiting  . Levaquin [Levofloxacin] Nausea And Vomiting and Rash  . Metronidazole Nausea And Vomiting and Rash    Medications Reviewed Today    Reviewed by Lauraine Rinne, NP (Nurse Practitioner) on 11/09/18 at 1747  Med List Status: <None>  Medication Order Taking? Sig Documenting Provider Last Dose Status Informant  amitriptyline (ELAVIL) 10 MG tablet [id number] Yes Take 2 tablets (20 mg total) by mouth at bedtime. Cameron Sprang, MD Taking Active   aspirin EC 81 MG tablet [id number] Yes Take 81 mg by mouth daily. [provider] Taking Active   atorvastatin (LIPITOR) 10 MG tablet [id number] Yes Take 10 mg by mouth at bedtime.  [provider] Taking Active Self  baclofen (LIORESAL) 10 MG tablet [id number] Yes Take 10 mg by mouth 2 (two) times daily as needed. [provider] Taking Active Self  budesonide-formoterol (SYMBICORT) 160-4.5 MCG/ACT inhaler [id number] Yes Inhale 2 puffs into the lungs 2 (two) times daily. [provider] Taking Active Self  carvedilol (COREG) 3.125 MG tablet [id number] Yes Take by mouth. [provider] Taking Active   cholecalciferol (VITAMIN D) 25 MCG (1000 UT) tablet [id number] Yes Take by mouth. [provider] Taking Active   doxycycline (VIBRA-TABS) 100 MG tablet [id number]  Take 1 tablet (100 mg total) by mouth 2 (two) times daily. Lauraine Rinne, NP  Active   ferrous sulfate (SLOW RELEASE IRON) 160 (50 Fe) MG TBCR SR tablet [id number] Yes Take 1 tablet by mouth daily. [provider] Taking Active   Fluticasone-Umeclidin-Vilant (TRELEGY ELLIPTA) 100-62.5-25 MCG/INH AEPB [id number] Yes Inhale 1 puff into the lungs daily.  Lauraine Rinne, NP Taking Active   HYDROMET 5-1.5 MG/5ML syrup [id number] Yes as needed. 1 teaspoon daily as needed [provider] Taking Active   Ipratropium-Albuterol (COMBIVENT RESPIMAT) 20-100 MCG/ACT AERS respimat [id number] Yes inhale 1 puff by mouth four times a day as needed for shortness of breath. [provider] Taking Active   levETIRAcetam (KEPPRA) 500 MG tablet [id number] Yes Take by mouth. [provider] Taking  Active   losartan (COZAAR) 25 MG tablet [id number] Yes Take by mouth. [provider] Taking Active   magnesium oxide (MAG-OX) 400 (241.3 Mg) MG tablet [id number] Yes Take by mouth. [provider] Taking Active   meclizine (ANTIVERT) 12.5 MG tablet [id number] Yes Take 1 tablet (12.5 mg total) by mouth 3 (three) times daily as needed for dizziness. Cameron Sprang, MD Taking Active   ondansetron [facility]) 4 MG tablet [id number] Yes Take by mouth. [provider] Taking Active   oxycodone (OXY-IR) 5 MG capsule [id number] Yes Take 5 mg by mouth every 4 (four) hours as needed. [provider] Taking Active   pantoprazole (PROTONIX) 40 MG tablet [id number] Yes Take 40 mg by mouth 2 (two) times daily.  [provider] Taking Active Self  potassium chloride SA (K-DUR,KLOR-CON) 20 MEQ tablet [id number]  Take 1 tablet (20 mEq total) by mouth 2 (two) times daily. [name], [place]  Expired 08/03/18 2359   predniSONE (DELTASONE) 10 MG tablet [id number] Yes Take 10 mg by mouth daily with breakfast. [provider] Taking Active Self  predniSONE (DELTASONE) 10 MG tablet [id number]  4 tabs for 2 days, then 3 tabs for 2 days, 2 tabs for 2 days, then 1 tab for 2 days, then stop Lauraine Rinne, NP  Active   torsemide (DEMADEX) 20 MG tablet [id number] Yes Take 1 tablet (20 mg total) by mouth daily. Lendon Colonel, NP Taking Active   traMADol Veatrice Bourbon) 50 MG tablet [id number] Yes Take 50 mg by mouth every 8 (eight) hours. [provider] Taking Active   vitamin B-12 (CYANOCOBALAMIN) 1000 MCG tablet [id number] Yes Take by mouth. [provider] Taking Active           Assessment:  Drugs sorted by system:  Neurologic/Psychologic: amitriptyline, levetiracetam, meclizine  Cardiovascular: aspirin 42m, atorvastatin, carvedilol, losartan, torsemide  Pulmonary/Allergy: fluticasone-umeclidinium-vilanterol inhaler, ipratropium-albuterol respimat, hydromet  syrup  Gastrointestinal:ondansetron, pantoprazole  Endocrine:prednisone  Pain: baclofen, tramadol  Infectious Diseases: doxycycline  Vitamins/Minerals/Supplements: cholecalciferol, ferrous sulfate, potassium, vitamin B12  Medication Review Findings:  . Aspirin: Allergies listed to NSAIDs (N/V), patient tolerating aspirin    Medication Assistance Findings:  Medication assistance needs identified.    Per review of [employer]:  -$95 deductible on Tiers 3-5.    -Tier 3: $47 copay -Tier 4: $100 copay -OTC catalogue benefits -Januvia, Trelegy, Combivent all Tier 3  Extra Help:   []  Already receiving Full Extra Help  []  Already receiving Partial Extra Help  []  Eligible based on reported income and assets  [x]  Not Eligible based on reported income and assets  Patient Assistance Programs: 1) Trelegy made by [place] Income requirement met: [x]  Yes []  No []  Unknown o Out-of-pocket prescription expenditure met:    []  Yes []  No  [x]  ($600) Unknown  []  Not applicable - Patient has not met application requirements to apply for this patient assistance program at this time.         2)  Combivent made by [place]ment met: [  x] Yes []  No  []  Unknown o Out-of-pocket prescription expenditure met:   []  Yes []  No   []  Unknown [x]  Not applicable - Patient has met application requirements to apply for this patient assistance program.           3)  Januvia made by [employer] o Income requirement met: [x]  Yes []  No  []  Unknown o Out-of-pocket prescription expenditure met:   []  Yes []  No   []  Unknown [x]  Not applicable - Patient has met application requirements to apply for this patient assistance program.    Additional medication assistance options reviewed with patient as warranted:  Insurance OTC catalogue  Plan: I will route patient assistance letter to [place] technician who will coordinate patient assistance program application process  for medications listed above.  [facility] pharmacy technician will assist with obtaining all required documents from both patient and provider(s) and submit application(s) once completed.    Ralene Bathe, PharmD, [place] [phone]

## 2018-11-13 NOTE — Telephone Encounter (Signed)
Sherry with [facility] would like to talk to lauren directly regarding this patients medication. She states she has spoke with other people in the past and she gets no where. She states lauren is aware of the situation and she only wants to speak with her regarding this patients med list.   Call back [phone]

## 2018-11-13 NOTE — Telephone Encounter (Signed)
Called and spoke with Judeen Hammans at [facility] in regards to pt's Trelegy. Stated to [place] that pt is supposed to be on med. Per Judeen Hammans, Dr. Curly Rim did have a copy of pt's OV from 11/09/2018 and would look over it to see if anything else needed to be done. Stated to [place] that pt has f/u appt with our office in 2 weeks. Sherry expressed understanding. Nothing further needed.

## 2018-11-13 NOTE — Telephone Encounter (Signed)
Please review the AVS instructions listed below:  Duoneb today   Prednisone 10mg  tablet  >>>4 tabs for 2 days, then 3 tabs for 2 days, 2 tabs for 2 days, then 1 tab daily as prescribed >>>take with food  >>>take in the morning   Doxycycline >>> 1 100 mg tablet every 12 hours for 7 days >>>take with food  >>>wear sunscreen   Start Trelegy Ellipta  >>> 1 puff daily in the morning >>>rinse mouth out after use  >>> This inhaler contains 3 medications that help manage her respiratory status, contact our office if you cannot afford this medication or unable to remain on this medication   Call Lauren your HF RN with [employer] to ask about your medications and cost   We recommend that you stop smoking.  >>>You need to set a quit date >>>If you have friends or family who smoke, let them know you are trying to quit and not to smoke around you or in your living environment  Smoking Cessation Resources:  1 800 QUIT NOW  >>> Patient to call this resource and utilize it to help support her quit smoking >>> Keep up your hard work with stopping smoking  You can also contact the [facility] >>>For smoking cessation classes call [phone]  We do not recommend using e-cigarettes as a form of stopping smoking  You can sign up for smoking cessation support texts and information:  >>>https://smokefree.gov/smokefreetxt   Note your daily symptoms >remember "red flags" for COPD:  >>>Increase in cough >>>increase in sputum production >>>increase in shortness of breath or activity  intolerance.   If you notice these symptoms, please call the office to be seen.    FOLLOW UP WITH Raybon Conard Paul FNP IN 2 WEEKS   There are no new changes to the patient's plan of care.  Please route or fax my office note to the primary care office.  The patient needs to contact her insurance company to see if the reason why she is having such high cost is due to having a high deductible.   I do not believe the patient is in the coverage Also known as the donut hole.  If the patient does have a high deductible then does not matter which inhaler I placed the patient on it will be a high cost.  I would like for the patient to remain on Trelegy Ellipta which is what we provided a sample for 4.  She also is following up with [place] in 2 weeks.   Wyn Quaker, FNP

## 2018-11-13 NOTE — Telephone Encounter (Signed)
Patient was seen in the office on Thursday last week by Wyn Quaker, NP. Aaron Edelman please advise if the patient was given any changes to her medications. I know she was given another sample of Trelegy. I tried to call patient and was unable to reach.

## 2018-11-14 ENCOUNTER — Other Ambulatory Visit: Payer: Self-pay | Admitting: Pulmonary Disease

## 2018-11-14 ENCOUNTER — Other Ambulatory Visit: Payer: Self-pay | Admitting: Pharmacy Technician

## 2018-11-14 DIAGNOSIS — J449 Chronic obstructive pulmonary disease, unspecified: Secondary | ICD-10-CM

## 2018-11-14 NOTE — Patient Outreach (Signed)
[facility]) Care Management  11/14/2018  SKYY NILAN 20-Feb-1946 [id number]                          Medication Assistance Referral  Referral From: [place]  Medication/Company: Combivent / [employer] Patient application portion:  Mailed Provider application portion: Faxed  to B. Warner Mccreedy  Medication/Company: Celesta Gentile / [employer] Patient application portion:  Education officer, museum portion:  Mailed to Dr. Curly Rim   Follow up:  Will follow up with patient in 7-10 business days to confirm application(s) have been received.  [name] [name] [place] Certified Pharmacy Technician [place] Management Direct [phone]

## 2018-11-15 ENCOUNTER — Other Ambulatory Visit: Payer: Self-pay

## 2018-11-15 ENCOUNTER — Ambulatory Visit: Payer: Medicare Other | Admitting: Neurology

## 2018-11-15 ENCOUNTER — Encounter: Payer: Self-pay | Admitting: Neurology

## 2018-11-15 VITALS — BP 152/104 | HR 75 | Temp 98.1°F | Ht 59.0 in | Wt 110.0 lb

## 2018-11-15 DIAGNOSIS — F0781 Postconcussional syndrome: Secondary | ICD-10-CM

## 2018-11-15 DIAGNOSIS — R55 Syncope and collapse: Secondary | ICD-10-CM | POA: Diagnosis not present

## 2018-11-15 DIAGNOSIS — G629 Polyneuropathy, unspecified: Secondary | ICD-10-CM | POA: Diagnosis not present

## 2018-11-15 MED ORDER — AMITRIPTYLINE HCL 10 MG PO TABS: 20.0000 mg | ORAL_TABLET | Freq: Every day | ORAL | 3 refills | Status: DC

## 2018-11-15 MED ORDER — LEVETIRACETAM 500 MG PO TABS: 500.0000 mg | ORAL_TABLET | Freq: Two times a day (BID) | ORAL | 3 refills | Status: DC
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
## 2018-11-15 NOTE — Patient Instructions (Signed)
1. Continue amitriptyline 10mg : take 2 tablets every night 2. Continue Keppra 500mg  twice a day 3. Continue home exercise program, balance exercises 4. Follow-up in 6-8 months, call for any changes

## 2018-11-16 ENCOUNTER — Ambulatory Visit (INDEPENDENT_AMBULATORY_CARE_PROVIDER_SITE_OTHER): Payer: Medicare Other | Admitting: *Deleted

## 2018-11-16 DIAGNOSIS — I469 Cardiac arrest, cause unspecified: Secondary | ICD-10-CM | POA: Diagnosis not present

## 2018-11-16 DIAGNOSIS — R55 Syncope and collapse: Secondary | ICD-10-CM

## 2018-11-16 LAB — CUP PACEART REMOTE DEVICE CHECK
Battery Remaining Longevity: 131 mo
Battery Voltage: 3.01 V
HIGH POWER IMPEDANCE MEASURED VALUE: 86 Ohm
Implantable Pulse Generator Implant Date: 20181128
Lead Channel Impedance Value: 266 Ohm
Lead Channel Sensing Intrinsic Amplitude: 14.875 mV
Lead Channel Setting Pacing Amplitude: 2 V
Lead Channel Setting Pacing Pulse Width: 0.4 ms
MDC IDC LEAD IMPLANT DT: 20181128
MDC IDC LEAD LOCATION: 753860
MDC IDC MSMT LEADCHNL RV IMPEDANCE VALUE: 361 Ohm
MDC IDC MSMT LEADCHNL RV PACING THRESHOLD AMPLITUDE: 1 V
MDC IDC MSMT LEADCHNL RV PACING THRESHOLD PULSEWIDTH: 0.4 ms
MDC IDC MSMT LEADCHNL RV SENSING INTR AMPL: 14.875 mV
MDC IDC SESS DTM: 20200319125622
MDC IDC SET LEADCHNL RV SENSING SENSITIVITY: 0.3 mV
MDC IDC STAT BRADY RV PERCENT PACED: 0.01 %

## 2018-11-20 NOTE — Telephone Encounter (Signed)
LVM for Kimberly Downs with [facility] at [phone] in regards to pt's medication. X1

## 2018-11-21 NOTE — Telephone Encounter (Signed)
Attempted to call Judeen Hammans at [facility] but unable to reach her and unable to leave a message due to not being during normal business hours. Will try to call back later.

## 2018-11-23 ENCOUNTER — Ambulatory Visit (INDEPENDENT_AMBULATORY_CARE_PROVIDER_SITE_OTHER): Payer: Medicare Other | Admitting: Pulmonary Disease

## 2018-11-23 ENCOUNTER — Other Ambulatory Visit: Payer: Self-pay

## 2018-11-23 ENCOUNTER — Encounter: Payer: Self-pay | Admitting: Pulmonary Disease

## 2018-11-23 DIAGNOSIS — Z720 Tobacco use: Secondary | ICD-10-CM | POA: Diagnosis not present

## 2018-11-23 DIAGNOSIS — J432 Centrilobular emphysema: Secondary | ICD-10-CM | POA: Diagnosis not present

## 2018-11-23 NOTE — Progress Notes (Signed)
Virtual Visit via Telephone Note  I connected with Kimberly Downs on 11/23/18 at  2:30 PM EDT by telephone and verified that I am speaking with the correct person using two identifiers.   I discussed the limitations, risks, security and privacy concerns of performing an evaluation and management service by telephone and the availability of in person appointments. I also discussed with the patient that there may be a patient responsible charge related to this service. The patient expressed understanding and agreed to proceed.   History of Present Illness:  73 year old female current every day smoker followed in our office for COPD  PMH: CHF with reduced ejection fraction (20 to 30%) Smoker/ Smoking History: Current Smoker. 3 - 7 cigarettes a day.  87-pack-year smoker.  Patient is currently in lung cancer screening program on 05/24/2018 she completed lung cancer screening CT which was a lung RADS 2S. Maintenance:  Prednisone 10mg .  Trelegy Ellipta or Symbicort 160 Pt of: Dr. [place] Downs  Time when patient was reached: 1231 Time when visit ended: 1249 Patient consented to consult via telephone: yes People present and their role in pt care: Pt   Chief complaint: COPD II  73 year old female current every day smoker followed in our office for COPD.  Since last office visit patient has been doing better.  She reports she is left her friend on his house and is back home.  This will help her with not change smoking as much.  Patient reports she smokes 5 cigarettes a day.  She is also started working with [facility] and case management for cost of medications.  Patient has also followed up with [place] [facility] regarding the cost of her medication she is waiting to hear back on why they are so costly.  Patient continues to be maintained on Trelegy Ellipta at this time.  She reports that she feels that her breathing has been better since moving home.  She reports she has had less wheezing and less coughing.  She  still does have an underlying occasional cough.  MMRC - Breathlessness Score 3 - I stop for breath after walking about 100 yards or after a few minutes on level ground (isle at grocery store is 132ft)    Observations/Objective:  05/24/2018-CT chest lung cancer screening- lung RADS 2S, follow-up in 12 months, mild diffuse bronchial wall thickening and mild centrilobular and paraseptal emphysema, COPD, cylindrical bronchiectasis, multiple scattered pulmonary nodules, largest being right lower lobe 5.6 mm  05/20/2017-pulmonary function test severe obstructive airway disease, probable restriction, moderate diffusion defect, concavity and flow volume loops, F/F-66, FEV1 51, DLCO 68  Imaging:  11/10/2017-chest x-ray- mild hyperinflation of lungs, no active cardiopulmonary disease 03/15/2017-CT Angio- no evidence of pulmonary emboli, mild emphysematous changes are noted 11/11/2016-CT chest with contrast- acute fracture right seventh rib, mild pleural thickening, no pneumothorax, mild emphysematous changes are seen  Cardiac:  06/16/2017-echocardiogram-LV ejection fraction 25 to 30%, severely reduced systolic function  Assessment and Plan:  GOLD COPD II D Assessment: Patient maintained on Trelegy Ellipta at this time September/2018 pulmonary function test shows COPD 2, DLCO 68 Patient is a frequently exacerbates mMRC 3 today Patient struggles with affording medications Patient has been working with [facility] and case management for help with medication cost Patient is also been working with [place] [facility] her insurance company medication cost Patient reports that her breathing is better since moving back to her house She is smoking 5 cigarettes a day  Plan: Continue Trelegy Ellipta as prescribed Continue Prednisone 10mg  daily If  you run out of Trelegy Ellipta and cannot afford the refill that is at your pharmacy then you can resume Symbicort 160 You need to stop smoking! Continue to work  with [name] and case management for your medication cost Continue to work with your insurance company for your medication cost Telephonic outreach in 4 weeks to manage your breathing and check in with you   Tobacco abuse Assessment: Patient reports that she has moved back home and is no longer staying with her friend Kimberly Downs Patient reports that this move will help her with stopping smoking Patient currently smoking 5 cigarettes a day Lung RADS 2S on September/2019 CT chest  Plan: You need to stop smoking The fact that you continue to smoke makes it difficult for [place] to manage her breathing   Follow Up Instructions:  4 week telephonic follow up with Kimberly Quaker FNP    I discussed the assessment and treatment plan with the patient. The patient was provided an opportunity to ask questions and all were answered. The patient agreed with the plan and demonstrated an understanding of the instructions.   The patient was advised to call back or seek an in-person evaluation if the symptoms worsen or if the condition fails to improve as anticipated.  I provided 18 minutes of non-face-to-face time during this encounter.   Lauraine Rinne, NP

## 2018-11-23 NOTE — Telephone Encounter (Signed)
Spoke with Kimberly Downs at [place]. Pt's medication list has been updated. Nothing further was needed.

## 2018-11-23 NOTE — Patient Instructions (Addendum)
Cont Trelegy Ellipta  >>> 1 puff daily in the morning >>>rinse mouth out after use  >>> This inhaler contains 3 medications that help manage her respiratory status, contact our office if you cannot afford this medication or unable to remain on this medication  Continue 10mg  prednisone daily   Call Lauren your HF RN with [facility] to ask about your medications and cost   Call Dickey with [facility]   We recommend that you stop smoking.  >>>You need to set a quit date >>>If you have friends or family who smoke, let them know you are trying to quit and not to smoke around you or in your living environment  Smoking Cessation Resources:  1 800 QUIT NOW  >>> Patient to call this resource and utilize it to help support her quit smoking >>> Keep up your hard work with stopping smoking  You can also contact the [facility] >>>For smoking cessation classes call [phone]  We do not recommend using e-cigarettes as a form of stopping smoking  You can sign up for smoking cessation support texts and information:  >>>https://smokefree.gov/smokefreetxt       Note your daily symptoms >remember "red flags" for COPD:  >>>Increase in cough >>>increase in sputum production >>>increase in shortness of breath or activity  intolerance.   If you notice these symptoms, please call the office to be seen.         Coronavirus (COVID-19) Are you at risk?  Are you at risk for the Coronavirus (COVID-19)?  To be considered HIGH RISK for Coronavirus (COVID-19), you have to meet the following criteria:  . Traveled to [place], [place], [place], [place] or [place]; or in the [place] to [place], [place], [place], or [place]; and have fever, cough, and shortness of breath within the last 2 weeks of travel OR . Been in close contact with a person diagnosed with COVID-19 within the last 2 weeks and have fever, cough, and shortness of breath . IF YOU DO NOT MEET  THESE CRITERIA, YOU ARE CONSIDERED LOW RISK FOR COVID-19.  What to do if you are HIGH RISK for COVID-19?  [name] If you are having a medical emergency, call 911. . Seek medical care right away. Before you go to a doctor's office, urgent care or emergency department, call ahead and tell them about your recent travel, contact with someone diagnosed with COVID-19, and your symptoms. You should receive instructions from your physician's office regarding next steps of care.  . When you arrive at healthcare provider, tell the healthcare staff immediately you have returned from visiting [place], [place], [place], [place] or [place]; or traveled in the [place] to [place], [place], [place], or [place]; in the last two weeks or you have been in close contact with a person diagnosed with COVID-19 in the last 2 weeks.   . Tell the health care staff about your symptoms: fever, cough and shortness of breath. . After you have been seen by a medical provider, you will be either: o Tested for (COVID-19) and discharged home on quarantine except to seek medical care if symptoms worsen, and asked to  - Stay home and avoid contact with others until you get your results (4-5 days)  - Avoid travel on public transportation if possible (such as bus, train, or airplane) or o Sent to the Emergency Department by EMS for evaluation, COVID-19 testing, and possible admission depending on your condition and test results.  What  to do if you are LOW RISK for COVID-19?  Reduce your risk of any infection by using the same precautions used for avoiding the common cold or flu:  [name] Wash your hands often with soap and warm water for at least 20 seconds.  If soap and water are not readily available, use an alcohol-based hand sanitizer with at least 60% alcohol.  . If coughing or sneezing, cover your mouth and nose by coughing or sneezing into the elbow areas of your shirt or coat, into a tissue or into your sleeve (not your  hands). . Avoid shaking hands with others and consider head nods or verbal greetings only. . Avoid touching your eyes, nose, or mouth with unwashed hands.  . Avoid close contact with people who are sick. . Avoid places or events with large numbers of people in one location, like concerts or sporting events. . Carefully consider travel plans you have or are making. . If you are planning any travel outside or inside the [place], visit the CDC's Travelers' Health webpage for the latest health notices. . If you have some symptoms but not all symptoms, continue to monitor at home and seek medical attention if your symptoms worsen. . If you are having a medical emergency, call 911.   [place] / e-Visit: [email or web address]         [facility]: [facility]: [phone]                   [facility]: [phone]           It is flu season:   >>> Best ways to protect herself from the flu: Receive the yearly flu vaccine, practice good hand hygiene washing with soap and also using hand sanitizer when available, eat a nutritious meals, get adequate rest, hydrate appropriately   Please contact the office if your symptoms worsen or you have concerns that you are not improving.   Thank you for choosing [facility] for your healthcare, and for allowing [place] to partner with you on your healthcare journey. I am thankful to be able to provide care to you today.   [name]-C     Health Risks of Smoking Smoking cigarettes is very bad for your health. Tobacco smoke has over 200 known poisons in it. It contains the poisonous gases nitrogen oxide and carbon monoxide. There are over 60 chemicals in tobacco smoke that cause cancer. Smoking is difficult to quit because a chemical in tobacco, called nicotine, causes addiction or  dependence. When you smoke and inhale, nicotine is absorbed rapidly into the bloodstream through your lungs. Both inhaled and non-inhaled nicotine may be addictive. What are the risks of cigarette smoke? Cigarette smokers have an increased risk of many serious medical problems, including:  Lung cancer.  Lung disease, such as pneumonia, bronchitis, and emphysema.  Chest pain (angina) and heart attack because the heart is not getting enough oxygen.  Heart disease and peripheral blood vessel disease.  High blood pressure (hypertension).  Stroke.  Oral cancer, including cancer of the lip, mouth, or voice box.  Bladder cancer.  Pancreatic cancer.  Cervical cancer.  Pregnancy complications, including premature birth.  Stillbirths and smaller newborn babies, birth defects, and genetic damage to sperm.  Early menopause.  Lower estrogen level for women.  Infertility.  Facial wrinkles.  Blindness.  Increased risk of broken bones (fractures).  Senile dementia.  Stomach ulcers and internal bleeding.  Delayed wound healing and increased risk of complications during surgery.  Even smoking lightly shortens your life expectancy by several years. Because of secondhand smoke exposure, children of smokers have an increased risk of the following:  Sudden infant death syndrome (SIDS).  Respiratory infections.  Lung cancer.  Heart disease.  Ear infections. What are the benefits of quitting? There are many health benefits of quitting smoking. Here are some of them:  Within days of quitting smoking, your risk of having a heart attack decreases, your blood flow improves, and your lung capacity improves. Blood pressure, pulse rate, and breathing patterns start returning to normal soon after quitting.  Within months, your lungs may clear up completely.  Quitting for 10 years reduces your risk of developing lung cancer and heart disease to almost that of a nonsmoker.  People  who quit may see an improvement in their overall quality of life. How do I quit smoking?     Smoking is an addiction with both physical and psychological effects, and longtime habits can be hard to change. Your health care provider can recommend:  Programs and community resources, which may include group support, education, or talk therapy.  Prescription medicines to help reduce cravings.  Nicotine replacement products, such as patches, gum, and nasal sprays. Use these products only as directed. Do not replace cigarette smoking with electronic cigarettes, which are commonly called e-cigarettes. The safety of e-cigarettes is not known, and some may contain harmful chemicals.  A combination of two or more of these methods. Where to find more information  American Lung Association: www.lung.org  American Cancer Society: www.cancer.org Summary  Smoking cigarettes is very bad for your health. Cigarette smokers have an increased risk of many serious medical problems, including several cancers, heart disease, and stroke.  Smoking is an addiction with both physical and psychological effects, and longtime habits can be hard to change.  By stopping right away, you can greatly reduce the risk of medical problems for you and your family.  To help you quit smoking, your health care provider can recommend programs, community resources, prescription medicines, and nicotine replacement products such as patches, gum, and nasal sprays. This information is not intended to replace advice given to you by your health care provider. Make sure you discuss any questions you have with your health care provider. Document Released: 09/23/2004 Document Revised: 11/17/2017 Document Reviewed: 08/20/2016 Elsevier Interactive Patient Education  2019 Elsevier Inc.    Chronic Obstructive Pulmonary Disease Chronic obstructive pulmonary disease (COPD) is a long-term (chronic) lung problem. When you have COPD, it is  hard for air to get in and out of your lungs. Usually the condition gets worse over time, and your lungs will never return to normal. There are things you can do to keep yourself as healthy as possible.  Your doctor may treat your condition with: ? Medicines. ? Oxygen. ? Lung surgery.  Your doctor may also recommend: ? Rehabilitation. This includes steps to make your body work better. It may involve a team of specialists. ? Quitting smoking, if you smoke. ? Exercise and changes to your diet. ? Comfort measures (palliative care). Follow these instructions at home: Medicines  Take over-the-counter and prescription medicines only as told by your doctor.  Talk to your doctor before taking any cough or allergy medicines. You may need to avoid medicines that cause your lungs to be dry. Lifestyle  If you smoke, stop. Smoking makes the problem worse. If you  need help quitting, ask your doctor.  Avoid being around things that make your breathing worse. This may include smoke, chemicals, and fumes.  Stay active, but remember to rest as well.  Learn and use tips on how to relax.  Make sure you get enough sleep. Most adults need at least 7 hours of sleep every night.  Eat healthy foods. Eat smaller meals more often. Rest before meals. Controlled breathing Learn and use tips on how to control your breathing as told by your doctor. Try:  Breathing in (inhaling) through your nose for 1 second. Then, pucker your lips and breath out (exhale) through your lips for 2 seconds.  Putting one hand on your belly (abdomen). Breathe in slowly through your nose for 1 second. Your hand on your belly should move out. Pucker your lips and breathe out slowly through your lips. Your hand on your belly should move in as you breathe out.  Controlled coughing Learn and use controlled coughing to clear mucus from your lungs. Follow these steps: 1. Lean your head a little forward. 2. Breathe in deeply. 3. Try  to hold your breath for 3 seconds. 4. Keep your mouth slightly open while coughing 2 times. 5. Spit any mucus out into a tissue. 6. Rest and do the steps again 1 or 2 times as needed. General instructions  Make sure you get all the shots (vaccines) that your doctor recommends. Ask your doctor about a flu shot and a pneumonia shot.  Use oxygen therapy and pulmonary rehabilitation if told by your doctor. If you need home oxygen therapy, ask your doctor if you should buy a tool to measure your oxygen level (oximeter).  Make a COPD action plan with your doctor. This helps you to know what to do if you feel worse than usual.  Manage any other conditions you have as told by your doctor.  Avoid going outside when it is very hot, cold, or humid.  Avoid people who have a sickness you can catch (contagious).  Keep all follow-up visits as told by your doctor. This is important. Contact a doctor if:  You cough up more mucus than usual.  There is a change in the color or thickness of the mucus.  It is harder to breathe than usual.  Your breathing is faster than usual.  You have trouble sleeping.  You need to use your medicines more often than usual.  You have trouble doing your normal activities such as getting dressed or walking around the house. Get help right away if:  You have shortness of breath while resting.  You have shortness of breath that stops you from: ? Being able to talk. ? Doing normal activities.  Your chest hurts for longer than 5 minutes.  Your skin color is more blue than usual.  Your pulse oximeter shows that you have low oxygen for longer than 5 minutes.  You have a fever.  You feel too tired to breathe normally. Summary  Chronic obstructive pulmonary disease (COPD) is a long-term lung problem.  The way your lungs work will never return to normal. Usually the condition gets worse over time. There are things you can do to keep yourself as healthy as  possible.  Take over-the-counter and prescription medicines only as told by your doctor.  If you smoke, stop. Smoking makes the problem worse. This information is not intended to replace advice given to you by your health care provider. Make sure you discuss any questions you have  with your health care provider. Document Released: 02/02/2008 Document Revised: 09/20/2016 Document Reviewed: 09/20/2016 Elsevier Interactive Patient Education  2019 [employer].

## 2018-11-23 NOTE — Progress Notes (Signed)
Remote ICD transmission.   

## 2018-11-23 NOTE — Assessment & Plan Note (Signed)
Assessment: Patient reports that she has moved back home and is no longer staying with her friend [name] Patient reports that this move will help her with stopping smoking Patient currently smoking 5 cigarettes a day Lung RADS 2S on September/2019 CT chest  Plan: You need to stop smoking The fact that you continue to smoke makes it difficult for [place] to manage her breathing

## 2018-11-23 NOTE — Assessment & Plan Note (Addendum)
Assessment: Patient maintained on Trelegy Ellipta at this time September/2018 pulmonary function test shows COPD 2, DLCO 68 Patient is a frequently exacerbates mMRC 3 today Patient struggles with affording medications Patient has been working with [facility] and case management for help with medication cost Patient is also been working with [place] healthcare her insurance company medication cost Patient reports that her breathing is better since moving back to her house She is smoking 5 cigarettes a day  Plan: Continue Trelegy Ellipta as prescribed Continue Prednisone 10mg  daily If you run out of Trelegy Ellipta and cannot afford the refill that is at your pharmacy then you can resume Symbicort 160 You need to stop smoking! Continue to work with [facility] and case management for your medication cost Continue to work with your insurance company for your medication cost Telephonic outreach in 4 weeks to manage your breathing and check in with you

## 2018-11-24 ENCOUNTER — Telehealth (HOSPITAL_COMMUNITY): Payer: Self-pay | Admitting: *Deleted

## 2018-11-24 ENCOUNTER — Telehealth (HOSPITAL_COMMUNITY): Payer: Self-pay

## 2018-11-24 NOTE — Telephone Encounter (Signed)
Pt insurance is active and benefits verified through [employer]. Co-pay $20.00, DED $0.00/$0.00 met, out of pocket $4,500.00/$60.94 met, co-insurance 0%. No pre-authorization required. Jesse/[employer], 11/24/2018 @ 12:40PM, REF# [id number] °

## 2018-11-24 NOTE — Telephone Encounter (Signed)
Received Pulmonary Rehab referral on 3/17 for COPD Stage II per PFT completed on 05/20/2017 by Dr. Lake Bells.  Called and spoke with pt to determine if pt is appropriate for a group setting for exercise.  Reviewed pt medical history.  Pt with history of falls with serious injuries.  Pt last episode was in October 2019.  Since that time she has not fallen and feels the Keppra that was prescribed has helped. Pt seen in follow up by Neurologist Dr. Willeen Niece on 3/18.  Not additional testing prescribed. Falls felt to be as a result of seizures.  Pt completed PT and continues to do exercises at home 15-20 minutes a day. Pt continues to wear helmet at bedtime since the falls occurred during the night. Pt uses quad cane within the home and uses rolater for distances.  Pt resides alone and maintains the home, cooking and can perform ADL's independently. Feel pt is appropriate for Pulmonary rehab with modifications to exercise routine.  Pt advised that we are currently closed to patients due to Covid-19.  Once we have resumed scheduling pt we will contact her for scheduling.  Pt mentioned a friend who was referred to Pulmonary Rehab.  This pt will be her transportation to [place].  Will forward to staff for insurance benefits/eligibility. Pt requested call back with info. Cherre Huger, BSN Cardiac and Training and development officer

## 2018-11-24 NOTE — Telephone Encounter (Signed)
Called patient to see if she is interested in the Pulmonary Rehab Program. Patient expressed interest. Explained scheduling process and went over insurance, patient verbalized understanding. Adv pt we are closed due to COVID-19 at this time and will contact her once we resume scheduling.

## 2018-12-05 ENCOUNTER — Other Ambulatory Visit: Payer: Self-pay | Admitting: Pharmacy Technician

## 2018-12-05 NOTE — Patient Outreach (Signed)
[place] [facility]) Care Management  12/05/2018  Kimberly Downs 04/13/1946 [id number]   Received patient portion(s) of patient assistance application for Januvia and Combivent . Prepared to mail completed application and required documents into [employer].  Refaxed provider portion of Combivent to B. Warner Mccreedy  Will follow up with [employer] in 14-21 business days to check status of application.  Will fax completed Combivent application to [facility] once provider portion has been received.  [name] [name] [place] Certified Pharmacy Technician [place] Management Direct [phone]

## 2018-12-08 ENCOUNTER — Ambulatory Visit: Payer: Self-pay

## 2018-12-11 ENCOUNTER — Other Ambulatory Visit: Payer: Self-pay

## 2018-12-11 NOTE — Patient Outreach (Signed)
[facility]) Care Management  12/11/2018  Kimberly Downs [birth year] [id number]    1st outreach attempt to the patient for initial assessment.  HIPAA verified.  Explained to the patient health coach role and [facility] services.  The patient states that she is interested in the program but is unable to talk today and asked if I would call her at a later time.  Plan:  [place] will outreach the patient within the next thirty days.  Lazaro Arms RN, BSN, [place] Direct Dial:  [phone]  Fax: [phone]

## 2018-12-12 ENCOUNTER — Other Ambulatory Visit: Payer: Self-pay

## 2018-12-12 NOTE — Patient Outreach (Signed)
[place] [facility]) Care Management  12/12/2018  Kimberly Downs 23-Jul-1946 [id number]    Successful call to the patient for initial assessment.  HIPAA verified.  While gathering information with the patient, she notified me that she has been working with [place] program for a year now.  She states that Lauren monitors her HF, COPD and DM. The assessment was discontinued.    Plan:  RN Health Coach will close the program due patient is active in another CM program.  Will send not to notify physician.   Lazaro Arms RN, BSN, [place] Direct Dial:  [phone]  Fax: [phone]

## 2018-12-13 ENCOUNTER — Telehealth: Payer: Self-pay | Admitting: Cardiovascular Disease

## 2018-12-13 NOTE — Telephone Encounter (Signed)
Spoke with pt she states that she has increased swelling and blisters on bilat LE. She states that she is SOB without any exertion that comes and goes she has h/o asthma she states that when she does her neb she states that it makes her have so much phelgm and does npt help wth the SOB. She states that she is taking her torsemide 20mg  daily in the past she says that Dr Gwenlyn Found has tol her to increase for a couple days and call back. Would you like her to do this again? Please advise

## 2018-12-13 NOTE — Telephone Encounter (Signed)
New Message   Pt c/o swelling: STAT is pt has developed SOB within 24 hours  1) How much weight have you gained and in what time span? 3lbs  2) If swelling, where is the swelling located? Legs and hands. Legs have a blister with water.  3) Are you currently taking a fluid pill? yes  4) Are you currently SOB? Increased sob  5) Do you have a log of your daily weights (if so, list)? 04/10 109, 04/12 111lb, 4/15 114.8  6) Have you gained 3 pounds in a day or 5 pounds in a week? 5lbs in 5 days  7) Have you traveled recently? No    [employer] nurse case manager called on behalf of patient. She states that the patient has some weight gain. The patients vitals today are:  oxygen 93,   BP 146/56. Please contact patient

## 2018-12-14 NOTE — Telephone Encounter (Signed)
Increased torsemide from 20 to 40 mg a day for the next 4 days.  Have her see Jory Sims in the office next week for evaluation.  I have not seen her since October 2018.

## 2018-12-15 NOTE — Telephone Encounter (Signed)
Pt aware of recommendations to increase torsemide to 40 mg daily for 4 days and that she will be contacted by Jory Sims, DNP nurse to set up  appt for next. Pt verbalized understanding

## 2018-12-18 ENCOUNTER — Other Ambulatory Visit: Payer: Self-pay | Admitting: Pharmacy Technician

## 2018-12-18 NOTE — Patient Outreach (Signed)
[place] [facility]) Care Management  12/18/2018  Kimberly Downs February 15, 1946 [id number]    Follow up call placed to [employer] regarding patient assistance application(s) for [name] confirms application has been recieved. Attestation form mailed out to patient on 4/15.   Successful call placed to patient regarding patient assistance receipt of attestation form from [place] for [name], HIPAA identifiers verified. [name] states she has not received attestation form as of yet. Requested that she contact me when she has so that I may assist her with completing it. She stated she would.  Follow up:  Will follow up with patient in 2-3 business days if call has not been returned.  [name] [name] [place] Certified Pharmacy Technician [place] Management Direct Dial:[phone]

## 2018-12-18 NOTE — Telephone Encounter (Signed)
Returned call to pt she states that she would like to keep Dr berry's scheduled appointment. I have made an appointment for next week to discuss weight/medication she will try doxy.me I assured if she gets too trustrated we can switch to a telephone visit will discuss this further at appointment time. She will have weight.BP and medications available at 130pm appt time for discussion

## 2018-12-19 ENCOUNTER — Other Ambulatory Visit: Payer: Self-pay | Admitting: Pharmacist

## 2018-12-19 NOTE — Telephone Encounter (Signed)
Increase her torsemide from 20 mg a day to 20 mg twice daily.  Check a basic metabolic panel in 7 to 10 days and have her see an APP for a virtual office visit in 2 to 3 weeks.

## 2018-12-19 NOTE — Telephone Encounter (Signed)
Spoke with pt who state today is the 4 th day of the increased dose of 40 mg toresmide and she still experiencing some SOB and swelling. She report the swelling is mainly in her hands and ankle. She state last week she weighed 114 and today she is 111. She report normal dry weight is between 102-106 lbs. Pt denies any change in diet and report not urinating as much as she usually would with the increased dose. Will route to MD for recommendations.

## 2018-12-19 NOTE — Patient Outreach (Signed)
[place] [facility]) Care Management  [place] 12/19/2018  Kimberly Downs 23-Jun-1946 [id number]  Per [place] technician, no provider portion of patient assistance program received yet for Combivent.  Will route note to Dr. Wyn Quaker with pulmonary to clarify if patient should still be on this medication.   Ralene Bathe, PharmD, [place] [phone]

## 2018-12-19 NOTE — Telephone Encounter (Signed)
Follow up    [facility] case manager is calling on behalf of patient stating that the patient has not lost any weight. She is at 111.2. She still has sob and still have swollen hands and legs. Urination has not increased. Please call patient.

## 2018-12-20 ENCOUNTER — Other Ambulatory Visit: Payer: Self-pay | Admitting: Pharmacy Technician

## 2018-12-20 NOTE — Patient Outreach (Signed)
[facility]) Care Management  12/20/2018  Kimberly Downs 03/28/1946 [id number]   Received provider portion(s) of patient assistance application for Combivent. Faxed completed application and required documents into B-I.  Will follow up with company in 7-10 business days to check status of application.  [name] [name] [place] Certified Pharmacy Technician [facility] Management Direct [phone]

## 2018-12-21 ENCOUNTER — Other Ambulatory Visit: Payer: Self-pay

## 2018-12-21 ENCOUNTER — Encounter: Payer: Self-pay | Admitting: Pulmonary Disease

## 2018-12-21 ENCOUNTER — Ambulatory Visit (INDEPENDENT_AMBULATORY_CARE_PROVIDER_SITE_OTHER): Payer: Medicare Other | Admitting: Pulmonary Disease

## 2018-12-21 DIAGNOSIS — Z79899 Other long term (current) drug therapy: Secondary | ICD-10-CM | POA: Diagnosis not present

## 2018-12-21 DIAGNOSIS — J432 Centrilobular emphysema: Secondary | ICD-10-CM

## 2018-12-21 DIAGNOSIS — F17210 Nicotine dependence, cigarettes, uncomplicated: Secondary | ICD-10-CM

## 2018-12-21 DIAGNOSIS — Z720 Tobacco use: Secondary | ICD-10-CM

## 2018-12-21 MED ORDER — FLUTICASONE-UMECLIDIN-VILANT 100-62.5-25 MCG/INH IN AEPB: 1.0000 | INHALATION_SPRAY | Freq: Every day | RESPIRATORY_TRACT | 0 refills | Status: DC

## 2018-12-21 NOTE — Progress Notes (Addendum)
Virtual Visit via Telephone Note  I connected with Kimberly Downs on 12/21/18 at  2:00 PM EDT by telephone and verified that I am speaking with the correct person using two identifiers.   I discussed the limitations, risks, security and privacy concerns of performing an evaluation and management service by telephone and the availability of in person appointments. I also discussed with the patient that there may be a patient responsible charge related to this service. The patient expressed understanding and agreed to proceed.   History of Present Illness: 73 year old female current every day smoker followed in our office for COPD  PMH: CHF with reduced ejection fraction (20 to 30%) Smoker/ Smoking History: Current Smoker. 3 - 7 cigarettes a day.  87-pack-year smoker.  Patient is currently in lung cancer screening program on 05/24/2018 she completed lung cancer screening CT which was a lung RADS 2S. Maintenance:  Prednisone 10mg .  Trelegy Ellipta Pt of: Kimberly Downs  Patient consented to consult via telephone: Yes People present and their role in pt care: Pt   Chief complaint: COPD / Emphysema   73 year old female current every day smoker completing a 4-week telephonic outreach visit with our office today.  Patient reporting that her breathing has worsened slightly since last office visit.  Patient attributes this to her CHF exacerbation she is currently and she is on her fourth and final day of torsemide 20 mg twice daily from her cardiologist.  She reports that her symptoms have improved.  She also has been using her Trelegy Ellipta as an as needed medication due to the fact that she continues to struggle with affording and paying for these inhalers.  She is currently working with [facility] to obtain affordable medications as well as to find an inhaler that will help manage her breathing but that she can afford.  Patient feels better that her fluid pills are working.  She is  currently managed in the [place] healthcare's case management program for congestive heart failure.  She weighs herself every day.  She reports her weight today is 111 pounds.  Patient reports that she is tried to apply for the [place] for you program but she reports that she cannot afford financially paying $600 for her meds out-of-pocket before being able to obtain Trelegy Ellipta for free.  Patient also reports that pulmonary rehab has contacted her and they are sending via the mail workouts on printed worksheets for her to start doing at home.  They also reporting that she is on the list she start pulmonary rehab when COVID-19 restrictions are lifted.  Unfortunately the patient continues to smoke.  She is smoking 4 to 5 cigarettes a day.  She is not interested in stopping smoking at this time.  She knows that she needs to set a quit date.  MMRC - Breathlessness Score 4 - I am too breathless to leave the house or I am breathlessness when dressing  Smoking assessment and cessation counseling  Patient currently smoking: 4-5 cigarettes a day  I have advised the patient to quit/stop smoking as soon as possible due to high risk for multiple medical problems.  It will also be very difficult for [place] to manage patient's  respiratory symptoms and status if we continue to expose her lungs to a known irritant.  We do not advise e-cigarettes as a form of stopping smoking.  Patient is not willing to quit smoking.  I have advised the patient that we can assist and have options of  nicotine replacement therapy, provided smoking cessation education today, provided smoking cessation counseling, and provided cessation resources.  Follow-up next office visit office visit for assessment of smoking cessation.    Smoking cessation counseling advised for: 4 min    Observations/Objective:  12/21/2018 - weight - 111lbs  05/24/2018-CT chest lung cancer screening- lung RADS 2S, follow-up in 12 months, mild diffuse  bronchial wall thickening and mild centrilobular and paraseptal emphysema, COPD, cylindrical bronchiectasis, multiple scattered pulmonary nodules, largest being right lower lobe 5.6 mm  05/20/2017-pulmonary function test severe obstructive airway disease, probable restriction, moderate diffusion defect, concavity and flow volume loops, F/F-66, FEV1 51, DLCO 68  Imaging:  11/10/2017-chest x-ray- mild hyperinflation of lungs, no active cardiopulmonary disease 03/15/2017-CT Angio- no evidence of pulmonary emboli, mild emphysematous changes are noted 11/11/2016-CT chest with contrast- acute fracture right seventh rib, mild pleural thickening, no pneumothorax, mild emphysematous changes are seen  Cardiac:  06/16/2017-echocardiogram-LV ejection fraction 25 to 30%, severely reduced systolic function  No results found for: NITRICOXIDE  Assessment and Plan:  GOLD COPD II D Assessment: Patient is using trelegy as needed September/2019 pulmonary function test shows COPD Gold 2, DLCO 68 Patient is a frequent exacerbater mMRC 4 Patient continues to smoke 4 to 5 cigarettes a day 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 Continue to work with [facility] and case management on your medication cost Telephonic out reach visit in 4 weeks with Kimberly Quaker, FNP Start pulmonary rehab when COVID-19 restrictions are lifted  Tobacco abuse Assessment: Patient currently smoking 4 to 5 cigarettes a day Lung RADS 2S on September/2019 CT of her chest Patient has not set a quit date to stop smoking  Plan: You need to set a quit date to stop smoking You need to stop smoking Lung cancer screening CT of chest in September/2020  Medication  management Assessment: Currently working with [facility] pharmacist team Cannot afford current maintenance inhalers  Plan: Continue to work [facility] team to obtain more affordable medications Pickup Trelegy Ellipta 2 weeks sample from our office on 12/22/2018  A sample will be provided to the patient for her to pick up on 12/22/2018 of Trelegy Ellipta.  The sample is being provided due to the fact that we are in a global pandemic and she is currently working with [facility] for management of her medications.  Unfortunately she cannot informed her inhalers out-of-pocket at this time.  She is currently off of her inhalers and she requires a sample to be provided in order to prevent hospitalization.  Addendum: 12/22/2018 See additional telephone note encounter from [facility] pharmacist.  We will try to get the patient qualified for [facility] 200 ([employer]ck) as well as Spiriva Respimat 2.5 (twice daily).  We will do this instead of Trelegy Ellipta.  The hopes are that the patient can get qualified for both of these medications so that she can be maintained on a maintenance inhaler.  She will be instructed on how to use the [facility] inhaler as well as the Respimat inhaler today when she picks at the samples.  Follow Up Instructions:  Return in about 4 weeks (around 01/18/2019), or if symptoms worsen or fail to improve, for Follow up with Kimberly Quaker FNP-C.    I discussed  the assessment and treatment plan with the patient. The patient was provided an opportunity to ask questions and all were answered. The patient agreed with the plan and demonstrated an understanding of the instructions.   The patient was advised to call back or seek an in-person evaluation if the symptoms worsen or if the condition fails to improve as anticipated.  I provided 34 minutes of non-face-to-face time during this encounter.   Lauraine Rinne, NP

## 2018-12-21 NOTE — Assessment & Plan Note (Addendum)
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 Continue to work [facility] and case management on your medication cost Telephonic out reach visit in 4 weeks with Wyn Quaker, FNP Start pulmonary rehab when COVID-19 restrictions are lifted

## 2018-12-21 NOTE — Assessment & Plan Note (Signed)
Assessment: Currently working with [facility] pharmacist team Cannot afford current maintenance inhalers  Plan: Continue to work with [facility] team to obtain more affordable medications Pickup Trelegy Ellipta 2 weeks sample from our office on 12/22/2018

## 2018-12-21 NOTE — Patient Instructions (Addendum)
Dulera 200 >>> 2 puffs in the morning right when you wake up, rinse out your mouth after use, 12 hours later 2 puffs, rinse after use >>> Take this daily, no matter what >>> This is not a rescue inhaler   Spiriva Respimat 2.5 >>> 2 puffs daily >>> Do this every day >>>This is not a rescue inhaler   Continue 10mg  prednisone daily   Start at home Pulmonary Rehab exercises   We recommend that you stop smoking.  >>>You need to set a quit date >>>If you have friends or family who smoke, let them know you are trying to quit and not to smoke around you or in your living environment  Smoking Cessation Resources:  1 800 QUIT NOW  >>> Patient to call this resource and utilize it to help support her quit smoking >>> Keep up your hard work with stopping smoking  You can also contact the [facility] >>>For smoking cessation classes call [phone]  We do not recommend using e-cigarettes as a form of stopping smoking  You can sign up for smoking cessation support texts and information:  >>>https://smokefree.gov/smokefreetxt   Note your daily symptoms >remember "red flags" for COPD:  >>>Increase in cough >>>increase in sputum production >>>increase in shortness of breath or activity intolerance.   If you notice these symptoms, please call the office to be seen.   Return in about 4 weeks (around 01/18/2019), or if symptoms worsen or fail to improve, for Follow up with [name]-C.    Coronavirus (COVID-19) Are you at risk?  Are you at risk for the Coronavirus (COVID-19)?  To be considered HIGH RISK for Coronavirus (COVID-19), you have to meet the following criteria:  . Traveled to [place], [place], [place], [place] or [place]; or in the [place] to [place], [place], [place], or [place]; and have fever, cough, and shortness of breath within the last 2 weeks of travel OR . Been in close contact with a person diagnosed with COVID-19 within  the last 2 weeks and have fever, cough, and shortness of breath . IF YOU DO NOT MEET THESE CRITERIA, YOU ARE CONSIDERED LOW RISK FOR COVID-19.  What to do if you are HIGH RISK for COVID-19?  [name] If you are having a medical emergency, call 911. . Seek medical care right away. Before you go to a doctor's office, urgent care or emergency department, call ahead and tell them about your recent travel, contact with someone diagnosed with COVID-19, and your symptoms. You should receive instructions from your physician's office regarding next steps of care.  . When you arrive at healthcare provider, tell the healthcare staff immediately you have returned from visiting [place], [place], [place], [place] or [place]; or traveled in the [place] to [place], [place], [place], or [place]; in the last two weeks or you have been in close contact with a person diagnosed with COVID-19 in the last 2 weeks.   . Tell the health care staff about your symptoms: fever, cough and shortness of breath. . After you have been seen by a medical provider, you will be either: o Tested for (COVID-19) and discharged home on quarantine except to seek medical care if symptoms worsen, and asked to  - Stay home and avoid contact with others until you get your results (4-5 days)  - Avoid travel on public transportation if possible (such as bus, train, or airplane) or o Sent to the Emergency Department by EMS  for evaluation, COVID-19 testing, and possible admission depending on your condition and test results.  What to do if you are LOW RISK for COVID-19?  Reduce your risk of any infection by using the same precautions used for avoiding the common cold or flu:  [name] Wash your hands often with soap and warm water for at least 20 seconds.  If soap and water are not readily available, use an alcohol-based hand sanitizer with at least 60% alcohol.  . If coughing or sneezing, cover your mouth and nose by coughing or sneezing into the  elbow areas of your shirt or coat, into a tissue or into your sleeve (not your hands). . Avoid shaking hands with others and consider head nods or verbal greetings only. . Avoid touching your eyes, nose, or mouth with unwashed hands.  . Avoid close contact with people who are sick. . Avoid places or events with large numbers of people in one location, like concerts or sporting events. . Carefully consider travel plans you have or are making. . If you are planning any travel outside or inside the [place], visit the CDC's Travelers' Health webpage for the latest health notices. . If you have some symptoms but not all symptoms, continue to monitor at home and seek medical attention if your symptoms worsen. . If you are having a medical emergency, call 911.   [place] / e-Visit: [email or web address]         [facility]: [facility]: [phone]                   [facility]: [phone]           It is flu season:   >>> Best ways to protect herself from the flu: Receive the yearly flu vaccine, practice good hand hygiene washing with soap and also using hand sanitizer when available, eat a nutritious meals, get adequate rest, hydrate appropriately   Please contact the office if your symptoms worsen or you have concerns that you are not improving.   Thank you for choosing [facility] for your healthcare, and for allowing [place] to partner with you on your healthcare journey. I am thankful to be able to provide care to you today.   [name]-C    Health Risks of Smoking Smoking cigarettes is very bad for your health. Tobacco smoke has over 200 known poisons in it. It contains the poisonous gases nitrogen oxide and carbon monoxide. There are over 60 chemicals in tobacco smoke that cause cancer. Smoking is difficult to quit  because a chemical in tobacco, called nicotine, causes addiction or dependence. When you smoke and inhale, nicotine is absorbed rapidly into the bloodstream through your lungs. Both inhaled and non-inhaled nicotine may be addictive. What are the risks of cigarette smoke? Cigarette smokers have an increased risk of many serious medical problems, including:  Lung cancer.  Lung disease, such as pneumonia, bronchitis, and emphysema.  Chest pain (angina) and heart attack because the heart is not getting enough oxygen.  Heart disease and peripheral blood vessel disease.  High blood pressure (hypertension).  Stroke.  Oral cancer, including cancer of the lip, mouth, or voice box.  Bladder cancer.  Pancreatic cancer.  Cervical cancer.  Pregnancy complications, including premature birth.  Stillbirths and smaller newborn babies, birth defects, and genetic damage to sperm.  Early menopause.  Lower estrogen level for women.  Infertility.  Facial wrinkles.  Blindness.  Increased risk of broken bones (fractures).  Senile dementia.  Stomach ulcers and internal bleeding.  Delayed wound healing and increased risk of complications during surgery.  Even smoking lightly shortens your life expectancy by several years. Because of secondhand smoke exposure, children of smokers have an increased risk of the following:  Sudden infant death syndrome (SIDS).  Respiratory infections.  Lung cancer.  Heart disease.  Ear infections. What are the benefits of quitting? There are many health benefits of quitting smoking. Here are some of them:  Within days of quitting smoking, your risk of having a heart attack decreases, your blood flow improves, and your lung capacity improves. Blood pressure, pulse rate, and breathing patterns start returning to normal soon after quitting.  Within months, your lungs may clear up completely.  Quitting for 10 years reduces your risk of developing lung  cancer and heart disease to almost that of a nonsmoker.  People who quit may see an improvement in their overall quality of life. How do I quit smoking?     Smoking is an addiction with both physical and psychological effects, and longtime habits can be hard to change. Your health care provider can recommend:  Programs and community resources, which may include group support, education, or talk therapy.  Prescription medicines to help reduce cravings.  Nicotine replacement products, such as patches, gum, and nasal sprays. Use these products only as directed. Do not replace cigarette smoking with electronic cigarettes, which are commonly called e-cigarettes. The safety of e-cigarettes is not known, and some may contain harmful chemicals.  A combination of two or more of these methods. Where to find more information  American Lung Association: www.lung.org  American Cancer Society: www.cancer.org Summary  Smoking cigarettes is very bad for your health. Cigarette smokers have an increased risk of many serious medical problems, including several cancers, heart disease, and stroke.  Smoking is an addiction with both physical and psychological effects, and longtime habits can be hard to change.  By stopping right away, you can greatly reduce the risk of medical problems for you and your family.  To help you quit smoking, your health care provider can recommend programs, community resources, prescription medicines, and nicotine replacement products such as patches, gum, and nasal sprays. This information is not intended to replace advice given to you by your health care provider. Make sure you discuss any questions you have with your health care provider. Document Released: 09/23/2004 Document Revised: 11/17/2017 Document Reviewed: 08/20/2016 Elsevier Interactive Patient Education  2019 [employer].     Steps to Quit Smoking  Smoking tobacco can be bad for your health. It can also  affect almost every organ in your body. Smoking puts you and people around you at risk for many serious long-lasting (chronic) diseases. Quitting smoking is hard, but it is one of the best things that you can do for your health. It is never too late to quit. What are the benefits of quitting smoking? When you quit smoking, you lower your risk for getting serious diseases and conditions. They can include:  Lung cancer or lung disease.  Heart disease.  Stroke.  Heart attack.  Not being able to have children (infertility).  Weak bones (osteoporosis) and broken bones (fractures). If you have coughing, wheezing, and shortness of breath, those symptoms may get better when you quit. You may also get sick less often. If you are pregnant, quitting smoking can help to lower your chances of having a  baby of low birth weight. What can I do to help me quit smoking? Talk with your doctor about what can help you quit smoking. Some things you can do (strategies) include:  Quitting smoking totally, instead of slowly cutting back how much you smoke over a period of time.  Going to in-person counseling. You are more likely to quit if you go to many counseling sessions.  Using resources and support systems, such as: ? Database administrator with a Social worker. ? Phone quitlines. ? Careers information officer. ? Support groups or group counseling. ? Text messaging programs. ? Mobile phone apps or applications.  Taking medicines. Some of these medicines may have nicotine in them. If you are pregnant or breastfeeding, do not take any medicines to quit smoking unless your doctor says it is okay. Talk with your doctor about counseling or other things that can help you. Talk with your doctor about using more than one strategy at the same time, such as taking medicines while you are also going to in-person counseling. This can help make quitting easier. What things can I do to make it easier to quit? Quitting smoking  might feel very hard at first, but there is a lot that you can do to make it easier. Take these steps:  Talk to your family and friends. Ask them to support and encourage you.  Call phone quitlines, reach out to support groups, or work with a Social worker.  Ask people who smoke to not smoke around you.  Avoid places that make you want (trigger) to smoke, such as: ? Bars. ? Parties. ? Smoke-break areas at work.  Spend time with people who do not smoke.  Lower the stress in your life. Stress can make you want to smoke. Try these things to help your stress: ? Getting regular exercise. ? Deep-breathing exercises. ? Yoga. ? Meditating. ? Doing a body scan. To do this, close your eyes, focus on one area of your body at a time from head to toe, and notice which parts of your body are tense. Try to relax the muscles in those areas.  Download or buy apps on your mobile phone or tablet that can help you stick to your quit plan. There are many free apps, such as QuitGuide from the [employer] Office manager for Disease Control and Prevention). You can find more support from smokefree.gov and other websites. This information is not intended to replace advice given to you by your health care provider. Make sure you discuss any questions you have with your health care provider. Document Released: 06/12/2009 Document Revised: 04/13/2016 Document Reviewed: 12/31/2014 Elsevier Interactive Patient Education  2019 [employer].

## 2018-12-21 NOTE — Assessment & Plan Note (Signed)
Assessment: Patient currently smoking 4 to 5 cigarettes a day Lung RADS 2S on September/2019 CT of her chest Patient has not set a quit date to stop smoking  Plan: You need to set a quit date to stop smoking You need to stop smoking Lung cancer screening CT of chest in September/2020

## 2018-12-22 ENCOUNTER — Telehealth: Payer: Self-pay | Admitting: Pulmonary Disease

## 2018-12-22 ENCOUNTER — Other Ambulatory Visit: Payer: Self-pay | Admitting: Pharmacist

## 2018-12-22 ENCOUNTER — Other Ambulatory Visit: Payer: Self-pay | Admitting: Pharmacy Technician

## 2018-12-22 MED ORDER — MOMETASONE FURO-FORMOTEROL FUM 200-5 MCG/ACT IN AERO: 2.0000 | INHALATION_SPRAY | Freq: Two times a day (BID) | RESPIRATORY_TRACT | 0 refills | Status: DC

## 2018-12-22 MED ORDER — IPRATROPIUM-ALBUTEROL 20-100 MCG/ACT IN AERS: 1.0000 | INHALATION_SPRAY | Freq: Four times a day (QID) | RESPIRATORY_TRACT | 6 refills | Status: DC

## 2018-12-22 MED ORDER — TIOTROPIUM BROMIDE MONOHYDRATE 2.5 MCG/ACT IN AERS: 2.0000 | INHALATION_SPRAY | Freq: Every day | RESPIRATORY_TRACT | 6 refills | Status: DC

## 2018-12-22 MED ORDER — TIOTROPIUM BROMIDE MONOHYDRATE 2.5 MCG/ACT IN AERS: 2.0000 | INHALATION_SPRAY | Freq: Every day | RESPIRATORY_TRACT | 0 refills | Status: DC

## 2018-12-22 NOTE — Telephone Encounter (Signed)
Pt came in today to pick up samples. I instructed her on how to use the Respimat and the [facility] inhaler. Pt demonstrated understanding and nothing further is needed,

## 2018-12-22 NOTE — Telephone Encounter (Signed)
I received a fax from [facility] patient assistance for patient stating they could not provide medication due to [place]. And that she can apply in 1 year again. let me know if you want any further information or the fax from [facility]

## 2018-12-22 NOTE — Patient Outreach (Signed)
  Called placed to patient, HIPAA identifiers verified. Informed her that I would be mailing another [employer] application to her due to it being separate from the [place] we are currently working on. Also informed her I would not be mailing a B-I app and I can use the one she has already completed.  Informed her that I would be contacting [facility] to obtain printouts for Spiriva and Combivent for the Combivent appeal.  She informed me that she had completed [employer] attestation form for Januvia and had mailed it back on Wednesday.  Will follow up with [employer] in 7-10 business days to check status of Januvia application.  [name] [name] [place] Certified Pharmacy Technician [place] Management Direct Dial:[phone]

## 2018-12-22 NOTE — Patient Outreach (Addendum)
[place] [facility]) Care Management  [place] 12/22/2018  Kimberly Downs Aug 27, 1946 [id number]  CC'd chart received from Dr. Wyn Quaker with pulmonary that patient to pick up 2 week sample of Trelegy from pulmonary today.  Patient unable to meet patient assistance program requirements for Trelegy therefore Dr. Warner Mccreedy ok with substituting to 2 inhalers (Dulera + Spiriva) to apply for these programs instead as patient needs to continue on maintenance regimen.   Combivent application in process already.    Plan: I will route patient assistance letter to [place] technician who will coordinate patient assistance program application process for medications listed above.  [facility] pharmacy technician will assist with obtaining all required documents from both patient and provider(s) and submit application(s) once completed.    Ralene Bathe, PharmD, [place] [phone]

## 2018-12-22 NOTE — Patient Outreach (Signed)
[place] [facility]) Care Management  12/22/2018  Kimberly Downs 12/27/1945 [id number]                          Medication Assistance Referral  Referral From: [place]  Medication/Company: Spiriva Respimat / B-I Patient application portion:  N/A already completed for Combivent Provider application portion: Faxed  to B. Warner Mccreedy, FNP  Medication/Company: Ruthe Mannan  / Merck Patient application portion:  Education officer, museum portion: [employer] to B. Warner Mccreedy, FNP   Follow up:  Will submit applications once all documents have been received.  [name] [name] Gerald Certified Pharmacy Technician [place] Management Direct [phone]

## 2018-12-22 NOTE — Telephone Encounter (Signed)
I am going to put patient samples for 1 month as we are limited during this time. IF there are any changes please let me know. I will call patient and let her know the samples are here and paperwork will be coming in mail from [facility] to fill out for medication.   Routing to [facility]

## 2018-12-22 NOTE — Telephone Encounter (Signed)
Called and spoke with patient. Told her samples were ready for pick up. She stated she already received paperwork and has filled one set with only one more set to do.   Nothing further needed

## 2018-12-22 NOTE — Telephone Encounter (Signed)
Thank you both.  Aaron Edelman

## 2018-12-22 NOTE — Telephone Encounter (Signed)
12/22/2018 1015  Please see staff message listed below from [facility] pharmacist:  ----- Message ----- From: Rudean Haskell, [facility] Sent: 12/22/2018   9:27 AM EDT To: Lauraine Rinne, NP Subject: RE: Maintenance Inhaler                        Hi Aaron Edelman,  Thinking of easiest patient assistance programs for patient to qualify for, [place] and Merck both have NO out of pocket expenditure. FYI though Merck takes 6-8 weeks usually for processing as they require all original paperwork via mail (no faxes accepted).   [employer] has a 3% house hold income out-of-pocket expenditure requirement so unfortunately, patient likely won't meet this for Symbicort.    If you want to replace all ingredients in Trelegy (ie: LABA, LAMA, ICS), could go with: -Dulera (LABA + ICS) via Merck  -Spiriva (LAMA) via [place] (LABA + LAMA) via [place] (ICS) via [employer]   Would you be able to give Ms. Feely samples to last at least 6-8 weeks until [employer] completed?  Just let me know what regimen you prefer and we'll get paperwork started.    Thanks, Jaclyn Shaggy ----- Message ----- From: Lauraine Rinne, NP Sent: 12/21/2018   4:14 PM EDT To: Rudean Haskell, RPH Subject: Maintenance Inhaler                            Colleen,  Just completed a tele-visit with the patient we are providing her with a Trelegy Ellipta sample she was using her Trelegy Ellipta as needed in order to space out the inhaler.  She reports that she did not qualify for the [place] for you program because she cannot afford the $600 out-of-pocket in order to get into that program.  Do you have any other suggestions or updates regarding other maintenance inhalers that she may be qualified for?   Should we try Symbicort/AstraZeneca? Or Stiolto?  Like I explained to Ms. Feely today she needs to be maintained on a maintenance inhaler as she is a frequent exacerbater.  Any  thoughts on this?  How can I help?  Aaron Edelman    I will route this message to Reatha Harps as well as my nurse Lauren so that we can start to work together to get the patient the necessary maintenance inhalers through patient assistance programs.  The patient needs to be maintained on at least a Lama/Laba but would prefer ICS/ LAMA/ LABA.   I would recommend that we proceed forward with BI - Spiriva Respimat 2.5 and Merck - Dulera 200.   [name]

## 2018-12-22 NOTE — Addendum Note (Signed)
Addended by: Amado Coe on: 12/22/2018 03:22 PM   Modules accepted: Orders

## 2018-12-22 NOTE — Telephone Encounter (Signed)
Please see last message from [facility] pharmacist):  Sounds good! We'll fax over paperwork today for Spiriva and will mail out [facility] application so that may not arrive until next week sometime (will also include return envelope to [facility]). Will also mail patient portion of applications to Ms. Muldoon. Will keep you updated. Thanks so much!  Joseph Art, FNP

## 2018-12-22 NOTE — Telephone Encounter (Signed)
Medication RX sent over to [facility] as requested by [facility].   Nothing further needed at this time.

## 2018-12-22 NOTE — Telephone Encounter (Signed)
Thank you Lauren.  I have updated the patient's AVS to reflect our new changes in plan of care.  Patient will be started on Dulera 200 as well as Spiriva Respimat 2.5.  Patient will work with [place] team to get qualified for these medications.   Please print the new updated AVS to provide with the patient.  Patient also needs to be instructed on how to use the [facility] as well as the Respimat inhaler today when she picks up the samples.  We will route to [place] and Caryl Pina to keep them in the loop and updated.  Wyn Quaker, FNP

## 2018-12-22 NOTE — Addendum Note (Signed)
Addended by: Amado Coe on: 12/22/2018 02:38 PM   Modules accepted: Orders

## 2018-12-23 NOTE — Progress Notes (Signed)
Reviewed, agree 

## 2018-12-25 ENCOUNTER — Telehealth: Payer: Self-pay | Admitting: Cardiovascular Disease

## 2018-12-25 ENCOUNTER — Telehealth: Payer: Self-pay | Admitting: Adult Health

## 2018-12-25 NOTE — Telephone Encounter (Signed)
°  Christina from [facility] calling to report, patient HR 132 while walking the dog this morning BP 150/63 96% room air  Pt c/o swelling: STAT is pt has developed SOB within 24 hours  1) How much weight have you gained and in what time span? 3.4 lbs in 5 days  2) If swelling, where is the swelling located? legs  3) Are you currently taking a fluid pill? yes  4) Are you currently SOB? Unsure, per [facility]   5) Do you have a log of your daily weights (if so, list)? 113.6 today, April 22 reported weight 110.2  6) Have you gained 3 pounds in a day or 5 pounds in a week?   7) Have you traveled recently? no
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
## 2018-12-25 NOTE — Telephone Encounter (Signed)
Spoke with pt about report from [place] from [facility]. Pt states that she is unable to report updated vitals b/c she is not currently at home. Pt confirmed 3.4 lb wt gain in 3 days, edema to BLE and taking torsemide 20 mg daily. States she had recently taken a double dose for a 4-day period. She states she gets SOB but this is 'only when swelling'  Current wt: 113.6 lbs April 22: 110.2 lbs  Pt states she told [facility] nurse that she has appt with Dr. Gwenlyn Found 4/28. Informed pt that message would be routed to Dr. Gwenlyn Found to review for appt tomorrow. Pt agreeable with this.

## 2018-12-26 ENCOUNTER — Telehealth (INDEPENDENT_AMBULATORY_CARE_PROVIDER_SITE_OTHER): Payer: Medicare Other | Admitting: Adult Health

## 2018-12-26 VITALS — BP 125/76 | HR 77 | Ht 59.0 in | Wt 114.2 lb

## 2018-12-26 DIAGNOSIS — I25119 Atherosclerotic heart disease of native coronary artery with unspecified angina pectoris: Secondary | ICD-10-CM

## 2018-12-26 DIAGNOSIS — R079 Chest pain, unspecified: Secondary | ICD-10-CM

## 2018-12-26 DIAGNOSIS — R0789 Other chest pain: Secondary | ICD-10-CM | POA: Diagnosis not present

## 2018-12-26 DIAGNOSIS — E7800 Pure hypercholesterolemia, unspecified: Secondary | ICD-10-CM

## 2018-12-26 DIAGNOSIS — I5042 Chronic combined systolic (congestive) and diastolic (congestive) heart failure: Secondary | ICD-10-CM

## 2018-12-26 MED ORDER — NITROGLYCERIN 0.4 MG SL SUBL: 0.4000 mg | SUBLINGUAL_TABLET | SUBLINGUAL | 3 refills | Status: DC | PRN

## 2018-12-26 NOTE — Patient Instructions (Addendum)
Medication Instructions:  Jory Sims, DNP has recommended making the following medication changes: 1. INCREASE Torsemide to 2 tablets every morning and 1 tablet every afternoon for 2 days then resume previous dose 2. INCREASE Potassium to 2 tablets every morning and 1 tablet every afternoon for 2 days then resume previous dose  If you need a refill on your cardiac medications before your next appointment, please call your pharmacy.   Lab work: Your physician recommends that you return for lab work on Friday. (BMET and BNP)   You may go to any [facility] location that is convenience for you however we do have a Lab located within our office.  If you have labs (blood work) drawn today and your tests are completely normal, you will receive your results only by: [name] MyChart Message (if you have MyChart) OR . A paper copy in the mail If you have any lab test that is abnormal or we need to change your treatment, we will call you to review the results.  Follow-Up: Jory Sims, DNP recommends that you schedule a follow-up appointment on Monday, 01/01/19 with Dr Stanford Breed for an in-office visit.  This has been scheduled for 01/01/19 at 3:20p.

## 2018-12-29 ENCOUNTER — Telehealth: Payer: Self-pay | Admitting: Cardiovascular Disease

## 2018-12-29 NOTE — Telephone Encounter (Signed)
Lauren with [facility] is calling because patient is taking her extra torsemide (DEMADEX) 20 MG tabletut her weight is going up and having increased SOB and swelling. Swelling is same as when she spoke to [employer]. Her weight today is 112.  Her BP today is 152/87 and yesterday it was 145/92 on the 29th 153/85. Patient does have appt on Monday 01/01/19.

## 2018-12-29 NOTE — Telephone Encounter (Signed)
Left message to call back  

## 2018-12-30 LAB — BASIC METABOLIC PANEL WITH GFR
BUN/Creatinine Ratio: 15 (ref 12–28)
BUN: 24 mg/dL (ref 8–27)
CO2: 30 mmol/L — ABNORMAL HIGH (ref 20–29)
Calcium: 9.5 mg/dL (ref 8.7–10.3)
Chloride: 96 mmol/L (ref 96–106)
Creatinine, Ser: 1.62 mg/dL — ABNORMAL HIGH (ref 0.57–1.00)
GFR calc Af Amer: 36 mL/min/1.73 — ABNORMAL LOW
GFR calc non Af Amer: 31 mL/min/1.73 — ABNORMAL LOW
Glucose: 125 mg/dL — ABNORMAL HIGH (ref 65–99)
Potassium: 4.6 mmol/L (ref 3.5–5.2)
Sodium: 140 mmol/L (ref 134–144)

## 2018-12-30 LAB — PRO B NATRIURETIC PEPTIDE: NT-Pro BNP: 402 pg/mL — ABNORMAL HIGH (ref 0–301)

## 2019-01-01 ENCOUNTER — Encounter: Payer: Self-pay | Admitting: Cardiology

## 2019-01-01 ENCOUNTER — Other Ambulatory Visit: Payer: Self-pay

## 2019-01-01 ENCOUNTER — Ambulatory Visit (INDEPENDENT_AMBULATORY_CARE_PROVIDER_SITE_OTHER): Payer: Medicare Other | Admitting: Cardiology

## 2019-01-01 VITALS — BP 142/90 | HR 74 | Ht 59.0 in | Wt 113.4 lb

## 2019-01-01 DIAGNOSIS — Z79899 Other long term (current) drug therapy: Secondary | ICD-10-CM

## 2019-01-01 DIAGNOSIS — I5042 Chronic combined systolic (congestive) and diastolic (congestive) heart failure: Secondary | ICD-10-CM

## 2019-01-01 DIAGNOSIS — I4891 Unspecified atrial fibrillation: Secondary | ICD-10-CM

## 2019-01-01 DIAGNOSIS — I200 Unstable angina: Secondary | ICD-10-CM

## 2019-01-01 DIAGNOSIS — I739 Peripheral vascular disease, unspecified: Secondary | ICD-10-CM | POA: Diagnosis not present

## 2019-01-01 DIAGNOSIS — I428 Other cardiomyopathies: Secondary | ICD-10-CM | POA: Diagnosis not present

## 2019-01-01 DIAGNOSIS — I10 Essential (primary) hypertension: Secondary | ICD-10-CM | POA: Diagnosis not present

## 2019-01-01 MED ORDER — CARVEDILOL 6.25 MG PO TABS: 6.2500 mg | ORAL_TABLET | Freq: Two times a day (BID) | ORAL | 3 refills | Status: DC

## 2019-01-01 MED ORDER — POTASSIUM CHLORIDE CRYS ER 20 MEQ PO TBCR: 20.0000 meq | EXTENDED_RELEASE_TABLET | Freq: Every day | ORAL | 6 refills | Status: DC

## 2019-01-01 MED ORDER — SPIRONOLACTONE 25 MG PO TABS: 12.5000 mg | ORAL_TABLET | Freq: Every day | ORAL | 6 refills | Status: DC
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
## 2019-01-01 NOTE — Addendum Note (Signed)
Addended by: Cristopher Estimable on: 01/01/2019 04:04 PM   Modules accepted: Orders

## 2019-01-01 NOTE — Patient Instructions (Addendum)
Medication Instructions:  INCREASE CARVEDILOL TO 6.25 MG TWICE DAILY= 2 OF THE 3.125 MG TABLETS TWICE DAILY  TAKE TORSEMIDE 20 MG ONCE DAILY If you need a refill on your cardiac medications before your next appointment, please call your pharmacy.   Lab work: If you have labs (blood work) drawn today and your tests are completely normal, you will receive your results only by: [name] MyChart Message (if you have MyChart) OR . A paper copy in the mail If you have any lab test that is abnormal or we need to change your treatment, we will call you to review the results.  Testing/Procedures: Your physician has requested that you have a lexiscan myoview. For further information please visit [email or web address]. Please follow instruction sheet, as given.  [place]  Follow-Up: At [facility], you and your health needs are our priority.  As part of our continuing mission to provide you with exceptional heart care, we have created designated Provider Care Teams.  These Care Teams include your primary Cardiologist (physician) and Advanced Practice Providers (APPs -  Physician Assistants and Nurse Practitioners) who all work together to provide you with the care you need, when you need it. Your physician recommends that you schedule a follow-up appointment in: [place]

## 2019-01-01 NOTE — Telephone Encounter (Signed)
Pt has appt today with Dr Stanford Breed at 3:20 pm .Adonis Housekeeper

## 2019-01-03 ENCOUNTER — Other Ambulatory Visit: Payer: Self-pay | Admitting: Pharmacy Technician

## 2019-01-03 ENCOUNTER — Ambulatory Visit: Payer: Medicare Other | Admitting: Cardiology

## 2019-01-03 NOTE — Patient Outreach (Signed)
[facility]) Care Management  01/03/2019  WILLER OSORNO Nov 11, 1945 [id number]    Follow up call placed to [employer] regarding patient assistance application(s) for Willette Brace confirms patient has been approved as of 4/29 until 08/30/19. Medication to arrive at patient home in 7-10 business days.  Follow up:  Will follow up with patient to update.  [name] [name] [place] Certified Pharmacy Technician [place] Management Direct Dial:[phone]

## 2019-01-03 NOTE — Patient Outreach (Signed)
[place]) Care Management  01/03/2019  Kimberly Downs February 05, 1946 [id number]    Unsuccessful call #1 placed to patient regarding patient assistance update for Januvia, Combivent and Spiriva, HIPAA compliant voicemail left.   Follow up:  Will make 2nd call attempt in 2-63 business days if call has not been returned.  [name] [name] [place] Certified Pharmacy Technician [place] Management Direct Dial:[phone]

## 2019-01-05 LAB — BASIC METABOLIC PANEL
BUN/Creatinine Ratio: 13 (ref 12–28)
BUN: 16 mg/dL (ref 8–27)
CO2: 29 mmol/L (ref 20–29)
Calcium: 9.8 mg/dL (ref 8.7–10.3)
Chloride: 100 mmol/L (ref 96–106)
Creatinine, Ser: 1.24 mg/dL — ABNORMAL HIGH (ref 0.57–1.00)
GFR calc Af Amer: 49 mL/min/{1.73_m2} — ABNORMAL LOW (ref >59)
GFR calc non Af Amer: 43 mL/min/{1.73_m2} — ABNORMAL LOW (ref >59)
Glucose: 112 mg/dL — ABNORMAL HIGH (ref 65–99)
Potassium: 4.3 mmol/L (ref 3.5–5.2)
Sodium: 142 mmol/L (ref 134–144)

## 2019-01-05 LAB — BRAIN NATRIURETIC PEPTIDE: BNP: 129.7 pg/mL — ABNORMAL HIGH (ref 0.0–100.0)

## 2019-01-12 ENCOUNTER — Other Ambulatory Visit: Payer: Self-pay | Admitting: Pharmacy Technician

## 2019-01-12 NOTE — Patient Outreach (Signed)
[place] [facility]) Care Management  01/12/2019  Kimberly Downs 1945-12-04 [id number]   Received patient portion(s) of patient assistance application for [facility]. Prepared for mail completed application and required documents to be sent to [employer].  Will follow up with company in 10-14 business days to check status of application.   Successful call placed to patient regarding patient assistance for Combivent and Spiriva, HIPAA identifiers verified. Kimberly Downs confirms she received her 3 month supply of Januvia from [employer]. Requested she contact [facility] and have them fax me her OOP spend for 2020 so that I can submit to [employer] to appeal Combivent and to apply for Spiriva. She stated she would contact them.  Follow up:  Will submit documents to [employer] once obtained.  Kimberly Downs [name] [place] Certified Pharmacy Technician [place] Management Direct [phone]

## 2019-01-16 ENCOUNTER — Other Ambulatory Visit: Payer: Self-pay | Admitting: Pharmacy Technician

## 2019-01-16 NOTE — Patient Outreach (Signed)
[place] [facility]) Care Management  01/16/2019  Kimberly Downs Oct 30, 1945 [id number]   Incoming call from patient stating that she contact [facility] to have them fax over a copy of her EOB, she was told that she would have to come in and show her ID in order for them to do that. She also contacted [facility] and they are going to mail her her EOB she states that when she receives it she will either mail it to me or fax it to me. She states she will call me and let me know how she is going to send it in.  [name] [name] [place] Certified Pharmacy Technician [place] Management Direct [phone]

## 2019-01-23 ENCOUNTER — Ambulatory Visit: Payer: Medicare Other | Admitting: Pulmonary Disease

## 2019-01-25 ENCOUNTER — Telehealth: Payer: Self-pay | Admitting: Pulmonary Disease

## 2019-01-25 NOTE — Telephone Encounter (Signed)
Primary Pulmonologist: BQ Last office visit and with whom: 12/21/2018 with Wyn Quaker What do we see them for (pulmonary problems): centrilobular emphysema Last OV assessment/plan: Instructions      Return in about 4 weeks (around 01/18/2019), or if symptoms worsen or fail to improve, for Follow up with [name]-C.  Dulera 200 >>> 2 puffs in the morning right when you wake up, rinse out your mouth after use, 12 hours later 2 puffs, rinse after use >>> Take this daily, no matter what >>> This is not a rescue inhaler   Spiriva Respimat 2.5 >>> 2 puffs daily >>> Do this every day >>>This is not a rescue inhaler   Continue 10mg  prednisone daily  Start at home Pulmonary Rehab exercises   We recommend that you stop smoking.  >>>You need to set a quit date >>>If you have friends or family who smoke, let them know you are trying to quit and not to smoke around you or in your living environment  Smoking Cessation Resources:  1 800 QUIT NOW  >>> Patient to call this resource and utilize it to help support her quit smoking >>> Keep up your hard work with stopping smoking  You can also contact the [facility] >>>For smoking cessation classes call [phone]  We do not recommend using e-cigarettes as a form of stopping smoking  You can sign up for smoking cessation support texts and information:  >>>https://smokefree.gov/smokefreetxt   Note your daily symptoms >remember "red flags" for COPD:  >>>Increase in cough >>>increase in sputum production >>>increase in shortness of breath or activity intolerance.   If you notice these symptoms, please call the office to be seen.   Return in about 4 weeks (around 01/18/2019), or if symptoms worsen or fail to improve, for Follow up with [name]-C.      Was appointment offered to patient (explain)?  Pt wants something prescribed   Reason for call: Called and spoke with pt who stated that she  has been wheezing for about 4 days and has had increased SOB x2 days. Pt was put on an abx by PCP due to her having a UTI and pt stated that she finished it yesterday. Pt stated that the SOB began worse after starting the abx but pt does not remember the name of the abx.  Pt is still taking dulera and spiriva as prescribed. Pt stated that she is still smoking and is smoking about 4-5 cigarettes a day.  Pt wants to know what we recommend to help her with her symptoms.  Per Aaron Edelman, schedule televisit with him so he can call pt to further address. televisit was scheduled for pt with Aaron Edelman tomorrow at 1:30. Nothing further needed.

## 2019-01-26 ENCOUNTER — Encounter: Payer: Self-pay | Admitting: Pulmonary Disease

## 2019-01-26 ENCOUNTER — Other Ambulatory Visit: Payer: Self-pay

## 2019-01-26 ENCOUNTER — Ambulatory Visit (INDEPENDENT_AMBULATORY_CARE_PROVIDER_SITE_OTHER): Payer: Medicare Other | Admitting: Pulmonary Disease

## 2019-01-26 DIAGNOSIS — F17210 Nicotine dependence, cigarettes, uncomplicated: Secondary | ICD-10-CM

## 2019-01-26 DIAGNOSIS — Z79899 Other long term (current) drug therapy: Secondary | ICD-10-CM | POA: Diagnosis not present

## 2019-01-26 DIAGNOSIS — J479 Bronchiectasis, uncomplicated: Secondary | ICD-10-CM | POA: Insufficient documentation

## 2019-01-26 DIAGNOSIS — J432 Centrilobular emphysema: Secondary | ICD-10-CM

## 2019-01-26 DIAGNOSIS — Z720 Tobacco use: Secondary | ICD-10-CM

## 2019-01-26 MED ORDER — PREDNISONE 10 MG PO TABS: ORAL_TABLET | ORAL | 0 refills | Status: DC

## 2019-01-26 NOTE — Assessment & Plan Note (Signed)
Assessment: Patient still currently smoking 5 cigarettes a day Lung RADS 2S on September/2019 CT chest Patient has not set quit date is not interested in stopping smoking at this time  Plan: We recommend that you stop smoking Lung cancer screening CT of chest needed in September/2020

## 2019-01-26 NOTE — Patient Instructions (Addendum)
Prednisone 10mg  tablet  >>>Take 2 tablets (20 mg total) daily for the next 5 days, then resume 10mg  daily >>> Take with food in the morning  Please bring [place] a sputum sample of what you are coughing up.  If you need a sputum cup please contact our office and we can have 1 set outside in the 4-year for you.  Dulera200 >>> 2 puffs in the morning right when you wake up, rinse out your mouth after use, 12 hours later 2 puffs, rinse after use >>> Take this daily, no matter what >>> This is not a rescue inhaler  Spiriva Respimat 2.5 >>> 2 puffs daily >>> Do this every day >>>This is not a rescue inhaler  Continue 10mg  prednisone daily  Bronchiectasis: This is the medical term which indicates that you have damage, dilated airways making you more susceptible to respiratory infection. Use a flutter valve 10 breaths twice a day or 4 to 5 breaths 4-5 times a day to help clear mucus out Let us know if you have cough with change in mucus color or fevers or chills.  At that point you would need an antibiotic. Maintain a healthy nutritious diet, eating whole foods Take your medications as prescribed    We recommend that you stop smoking.  >>>You need to set a quit date >>>If you have friends or family who smoke, let them know you are trying to quit and not to smoke around you or in your living environment  Smoking Cessation Resources:  1 800 QUIT NOW  >>> Patient to call this resource and utilize it to help support her quit smoking >>> Keep up your hard work with stopping smoking  You can also contact the [facility] >>>For smoking cessation classes call [phone]  We do not recommend using e-cigarettes as a form of stopping smoking  You can sign up for smoking cessation support texts and information:  >>>https://smokefree.gov/smokefreetxt   Note your daily symptoms >remember "red flags" for COPD:  >>>Increase in cough >>>increase in sputum  production >>>increase in shortness of breath or activity intolerance.   If you notice these symptoms, please call the office to be seen.   Return in about 4 weeks (around 02/23/2019), or if symptoms worsen or fail to improve, for Follow up with [name]-C.    Coronavirus (COVID-19) Are you at risk?  Are you at risk for the Coronavirus (COVID-19)?  To be considered HIGH RISK for Coronavirus (COVID-19), you have to meet the following criteria:  . Traveled to [place], [place], [place], [place] or [place]; or in the [place] to [place], [place], [place], or [place]; and have fever, cough, and shortness of breath within the last 2 weeks of travel OR . Been in close contact with a person diagnosed with COVID-19 within the last 2 weeks and have fever, cough, and shortness of breath . IF YOU DO NOT MEET THESE CRITERIA, YOU ARE CONSIDERED LOW RISK FOR COVID-19.  What to do if you are HIGH RISK for COVID-19?  [name] If you are having a medical emergency, call 911. . Seek medical care right away. Before you go to a doctor's office, urgent care or emergency department, call ahead and tell them about your recent travel, contact with someone diagnosed with COVID-19, and your symptoms. You should receive instructions from your physician's office regarding next steps of care.  . When you arrive at healthcare provider, tell the healthcare staff immediately you have returned from  visiting [place], [place], [place], [place] or [place]; or traveled in the [place] to [place], [place], [place], or [place]; in the last two weeks or you have been in close contact with a person diagnosed with COVID-19 in the last 2 weeks.   . Tell the health care staff about your symptoms: fever, cough and shortness of breath. . After you have been seen by a medical provider, you will be either: o Tested for (COVID-19) and discharged home on quarantine except to seek medical care if symptoms worsen, and  asked to  - Stay home and avoid contact with others until you get your results (4-5 days)  - Avoid travel on public transportation if possible (such as bus, train, or airplane) or o Sent to the Emergency Department by EMS for evaluation, COVID-19 testing, and possible admission depending on your condition and test results.  What to do if you are LOW RISK for COVID-19?  Reduce your risk of any infection by using the same precautions used for avoiding the common cold or flu:  [name] Wash your hands often with soap and warm water for at least 20 seconds.  If soap and water are not readily available, use an alcohol-based hand sanitizer with at least 60% alcohol.  . If coughing or sneezing, cover your mouth and nose by coughing or sneezing into the elbow areas of your shirt or coat, into a tissue or into your sleeve (not your hands). . Avoid shaking hands with others and consider head nods or verbal greetings only. . Avoid touching your eyes, nose, or mouth with unwashed hands.  . Avoid close contact with people who are sick. . Avoid places or events with large numbers of people in one location, like concerts or sporting events. . Carefully consider travel plans you have or are making. . If you are planning any travel outside or inside the [place], visit the CDC's Travelers' Health webpage for the latest health notices. . If you have some symptoms but not all symptoms, continue to monitor at home and seek medical attention if your symptoms worsen. . If you are having a medical emergency, call 911.   [place] / e-Visit: [email or web address]         [facility]: [facility]: [phone]                   [facility]: [phone]           It is flu season:   >>> Best ways to protect herself from the flu: Receive the yearly flu vaccine,  practice good hand hygiene washing with soap and also using hand sanitizer when available, eat a nutritious meals, get adequate rest, hydrate appropriately   Please contact the office if your symptoms worsen or you have concerns that you are not improving.   Thank you for choosing [facility] for your healthcare, and for allowing [place] to partner with you on your healthcare journey. I am thankful to be able to provide care to you today.   [name]-C

## 2019-01-26 NOTE — Progress Notes (Signed)
Virtual Visit via Telephone Note  I connected with Kimberly Downs on 01/26/19 at  1:30 PM EDT by telephone and verified that I am speaking with the correct person using two identifiers.  Location: Patient: Home Provider: [facility] - [address], [place], [place], [place] [address]   I discussed the limitations, risks, security and privacy concerns of performing an evaluation and management service by telephone and the availability of in person appointments. I also discussed with the patient that there may be a patient responsible charge related to this service. The patient expressed understanding and agreed to proceed.  Patient consented to consult via telephone: Yes People present and their role in pt care: Pt     History of Present Illness: 73 year old female current every day smoker followed in our office for COPD  PMH: CHF with reduced ejection fraction (20 to 30%) Smoker/ Smoking History: Current Smoker. 5 cigarettes a day.  87-pack-year smoker.  Patient is currently in lung cancer screening program on 05/24/2018 she completed lung cancer screening CT which was a lung RADS 2S. Maintenance:  Prednisone 10mg .  Spiriva 2.5  Dulera 200 Pt of: Dr. Lake Bells  Chief complaint: COPD   73 year old female current every day smoker followed in our office for COPD.  Patient contacted our office requesting medications to be called in due to shortness of breath as well as productive cough earlier this week she was scheduled for a telephone visit.  Patient reporting today that she has had a more productive cough recently with dark green mucus.  She recently completed a course of antibiotics within the last week from primary care for a UTI.  She cannot remember what the antibiotic was.  She reports that she has had increased wheezing as well.  Patient continues to smoke 5 cigarettes a day.  She is not currently interested in stopping smoking.  See smoking assessment and cessation  counseling listed below.  Patient does not have recent sputum cultures on file.  Patient also recently broke 3 bones in her left hand is currently in a cast.  She also has a 3 inch laceration on her right leg she reports from a fall at home.  She reports that the 3 inch laceration is healing well.  She is working with orthopedics/hand specialist for her 3 broken bones in her left hand.  Since last visit with the patient we have [facility] is been working with the patient to help with medication cost.  The patient reports that this is been a very successful intervention.  Her medication cost have gone from greater than $500 a month to round to $100 a month.  She reports that her inhalers which used to be greater than 400 last month are now close to around $90.  She is very grateful for this.  MMRC - Breathlessness Score 2 - on level ground, I walk slower than people of the same age because of breathlessness, or have to stop for breathe when walking to my own pace  Smoking assessment and cessation counseling  Patient currently smoking: 5 cigarettes  I have advised the patient to quit/stop smoking as soon as possible due to high risk for multiple medical problems.  It will also be very difficult for [place] to manage patient's  respiratory symptoms and status if we continue to expose her lungs to a known irritant.  We do not advise e-cigarettes as a form of stopping smoking.  Patient is not  willing to quit smoking. Hasnt set quit date.   I have advised the patient that we can assist and have options of nicotine replacement therapy, provided smoking cessation education today, provided smoking cessation counseling, and provided cessation resources.  Follow-up next office visit office visit for assessment of smoking cessation.  Smoking cessation counseling advised for: 3 min    Observations/Objective:  05/24/2018-CT chest lung cancer screening- lung RADS 2S, follow-up in 12  months, mild diffuse bronchial wall thickening and mild centrilobular and paraseptal emphysema, COPD, cylindrical bronchiectasis, multiple scattered pulmonary nodules, largest being right lower lobe 5.6 mm  05/20/2017-pulmonary function test severe obstructive airway disease, probable restriction, moderate diffusion defect, concavity and flow volume loops, F/F-66, FEV1 51, DLCO 68  Imaging:  11/10/2017-chest x-ray- mild hyperinflation of lungs, no active cardiopulmonary disease 03/15/2017-CT Angio- no evidence of pulmonary emboli, mild emphysematous changes are noted 11/11/2016-CT chest with contrast- acute fracture right seventh rib, mild pleural thickening, no pneumothorax, mild emphysematous changes are seen  Cardiac:  06/16/2017-echocardiogram-LV ejection fraction 25 to 30%, severely reduced systolic function  Assessment and Plan:  GOLD COPD II D Plan: Short course of prednisone today Patient to contact our office to let us know what antibiotic primary care prescribed to her We will culture patient sputum, patient reports she has a sputum cup at home that has not been used Continue Dulera 200 Continue Spiriva Respimat 2.5 Follow-up with our office in 4 weeks  Medication management Plan: Continue to work [facility] pharmacy team for more affordable medications Provided patient Ashley's direct line so she can call pharmacy tech back  Tobacco abuse Assessment: Patient still currently smoking 5 cigarettes a day Lung RADS 2S on September/2019 CT chest Patient has not set quit date is not interested in stopping smoking at this time  Plan: We recommend that you stop smoking Lung cancer screening CT of chest needed in September/2020  Bronchiectasis without complication ([place]) Plan: Patient reports she has a flutter valve at home but she has not been using, patient needs to resume use Patient to bring flutter valve to next office visit so we can evaluate as well  as also instructed patient on how to use it We will culture patient's sputum as she has a productive cough with green purulent sputum and has had recent antibiotic coverage Follow-up with our office in 4 weeks   Follow Up Instructions:  Return in about 4 weeks (around 02/23/2019), or if symptoms worsen or fail to improve, for Follow up with [name]-C.   I discussed the assessment and treatment plan with the patient. The patient was provided an opportunity to ask questions and all were answered. The patient agreed with the plan and demonstrated an understanding of the instructions.   The patient was advised to call back or seek an in-person evaluation if the symptoms worsen or if the condition fails to improve as anticipated.  I provided 26 minutes of non-face-to-face time during this encounter.   Lauraine Rinne, NP

## 2019-01-26 NOTE — Assessment & Plan Note (Signed)
Plan: Short course of prednisone today Patient to contact our office to let us know what antibiotic primary care prescribed to her We will culture patient sputum, patient reports she has a sputum cup at home that has not been used Continue Dulera 200 Continue Spiriva Respimat 2.5 Follow-up with our office in 4 weeks

## 2019-01-26 NOTE — Assessment & Plan Note (Signed)
Plan: Continue to work with [facility] pharmacy team for more affordable medications Provided patient Kimberly Downs's direct line so she can call pharmacy tech back

## 2019-01-26 NOTE — Assessment & Plan Note (Signed)
Plan: Patient reports she has a flutter valve at home but she has not been using, patient needs to resume use Patient to bring flutter valve to next office visit so we can evaluate as well as also instructed patient on how to use it We will culture patient's sputum as she has a productive cough with green purulent sputum and has had recent antibiotic coverage Follow-up with our office in 4 weeks

## 2019-01-28 NOTE — Progress Notes (Signed)
Reviewed, agree 

## 2019-01-29 ENCOUNTER — Other Ambulatory Visit: Payer: Self-pay | Admitting: Pharmacy Technician

## 2019-01-29 NOTE — Patient Outreach (Signed)
[facility]) Care Management  01/29/2019  ZARIEL CAPANO 1946-05-02 [id number]    Follow up call placed to [employer] regarding patient assistance application(s) for Dyane Dustman states that application has been received. Attestation form mailed out to patient on 5/27.   Successful call placed to patient regarding patient assistance update for [facility], HIPAA identifiers verified. Informed Ms. Booton that [employer] out attestation form on 5/27. Requested that she contact me when she receives it so that I can assist her with filling it out. She stated she would.  Follow up:  Will follow up with patient in 2-3 business days if call has not been returned.  [name] [name] [place] Certified Pharmacy Technician [place] Management Direct Dial:[phone]

## 2019-01-30 ENCOUNTER — Ambulatory Visit: Payer: Medicare Other | Admitting: Cardiovascular Disease

## 2019-01-31 ENCOUNTER — Other Ambulatory Visit: Payer: Medicare Other

## 2019-01-31 DIAGNOSIS — J432 Centrilobular emphysema: Secondary | ICD-10-CM

## 2019-02-05 NOTE — Progress Notes (Signed)
Spoke with pt and notified of results per Wyn Quaker , FNP. Pt verbalized understanding and denied any questions.

## 2019-02-05 NOTE — Progress Notes (Signed)
Sputum culture is negative. No changes to plan of care.   [name]

## 2019-02-12 ENCOUNTER — Other Ambulatory Visit: Payer: Self-pay | Admitting: Pharmacy Technician

## 2019-02-12 NOTE — Patient Outreach (Signed)
[facility]) Care Management  02/12/2019  ORTENCIA ASKARI [birth year] [id number]   Incoming call from patient stating she received attestation form from [employer] for [employer]. Assisted Ms. Charlot with filling out form and she states she will put in mail today.  She also states she was unable to fax EOB to me so she mailed it to me.  Will follow up with [employer] in 7-10 business days to check application status.  Will submit printout to [employer] for Spiriva and Combivent once document has been received.  [name] [name] [place] Certified Pharmacy Technician [place] Management Direct Dial:[phone]

## 2019-02-14 ENCOUNTER — Other Ambulatory Visit: Payer: Self-pay | Admitting: Pharmacy Technician

## 2019-02-14 NOTE — Patient Outreach (Signed)
[facility]) Care Management  02/14/2019  Kimberly Downs 30-Nov-1945 [id number]   Received patient EOB printout for patient assistance application for Combivent and Spiriva. Faxed completed application and required documents into [employer].  Will follow up with company in 7-10 business days to check status of application.  [name] [name] [place] Certified Pharmacy Technician [place] Management Direct Dial:[phone]

## 2019-02-15 ENCOUNTER — Ambulatory Visit (INDEPENDENT_AMBULATORY_CARE_PROVIDER_SITE_OTHER): Payer: Medicare Other | Admitting: *Deleted

## 2019-02-15 DIAGNOSIS — Z9189 Other specified personal risk factors, not elsewhere classified: Secondary | ICD-10-CM | POA: Diagnosis not present

## 2019-02-15 DIAGNOSIS — I428 Other cardiomyopathies: Secondary | ICD-10-CM

## 2019-02-16 ENCOUNTER — Telehealth: Payer: Self-pay | Admitting: Cardiovascular Disease

## 2019-02-16 ENCOUNTER — Telehealth: Payer: Self-pay

## 2019-02-16 DIAGNOSIS — Z79899 Other long term (current) drug therapy: Secondary | ICD-10-CM

## 2019-02-16 LAB — CUP PACEART REMOTE DEVICE CHECK
Battery Remaining Longevity: 129 mo
Battery Voltage: 3 V
Brady Statistic RV Percent Paced: 0.01 %
Date Time Interrogation Session: 20200619141354
HighPow Impedance: 75 Ohm
Implantable Lead Implant Date: 20181128
Implantable Lead Location: 753860
Implantable Pulse Generator Implant Date: 20181128
Lead Channel Impedance Value: 266 Ohm
Lead Channel Impedance Value: 323 Ohm
Lead Channel Pacing Threshold Amplitude: 1.375 V
Lead Channel Pacing Threshold Pulse Width: 0.4 ms
Lead Channel Sensing Intrinsic Amplitude: 9.875 mV
Lead Channel Sensing Intrinsic Amplitude: 9.875 mV
Lead Channel Setting Pacing Amplitude: 2.75 V
Lead Channel Setting Pacing Pulse Width: 0.4 ms
Lead Channel Setting Sensing Sensitivity: 0.3 mV

## 2019-02-16 NOTE — Telephone Encounter (Signed)
Follow BP at home and keep records; we will adjust based on those results Kirk Ruths

## 2019-02-16 NOTE — Telephone Encounter (Signed)
Left message for patient to remind of missed remote transmission.  

## 2019-02-16 NOTE — Telephone Encounter (Signed)
Called patient, she states her blood pressures have been all over the place- sometimes its high in the afternoons after her medications, and sometimes it is not. I asked patient to take her blood pressure while on the phone with me to see what it was now.  144/88 HR 95 Patient does mention having dizziness in the morning when she wakes up, she normally takes her medicine around 11 am.  Patient denies chest pain, patient does say her right leg/ankle/hand are swelling, patient denies any diet changes, or medication changes. Patient does have cough noted while on the phone with me, patient asked how long she has had that and she states she has had it for 'forever' she has COPD.   Please advise BP issue. Thank you!

## 2019-02-16 NOTE — Telephone Encounter (Signed)
Judging by symptoms, sequence of events and labs over the last couple of months, 20 mg of torsemide daily may be insufficient and 60 mg daily too much. Please have her take torsemide 40 mg daily, check BMET and another Optivol download in 30 days, please

## 2019-02-16 NOTE — Telephone Encounter (Signed)
New Message   Pt c/o BP issue:  1. What are your last 5 BP readings? 160/93, 167/88, 146/84, 144/95, 158/102 (all readings are prior to morning medications)  2. Are you having any other symptoms (ex. Dizziness, headache, blurred vision, passed out)?a cough that has been present for several days product with yellow mucous  3. What is your medication issue?     Christina with [facility] is reporting patient having increase BP. Please reach out to patient.

## 2019-02-16 NOTE — Telephone Encounter (Addendum)
Spoke to pt regarding alert for increasing optivol, Pt c/o SOB and right hand & leg swelling. Denies missing any medication doses. Will route to Dr. Sallyanne Kuster for review.

## 2019-02-16 NOTE — Telephone Encounter (Signed)
Spoke with pt, Aware of dr crenshaw's recommendations.  °

## 2019-02-19 MED ORDER — TORSEMIDE 20 MG PO TABS: 40.0000 mg | ORAL_TABLET | Freq: Every day | ORAL | 3 refills | Status: DC

## 2019-02-19 NOTE — Progress Notes (Signed)
Remote ICD transmission.   

## 2019-02-19 NOTE — Telephone Encounter (Signed)
Spoke with patient and communicated MD advice. She voiced understanding. Lab ordered. Med refilled for increased dose to [facility] per patient request. She reports she has another download on July 18

## 2019-02-20 NOTE — Telephone Encounter (Addendum)
Optivol download scheduled for 30 days on 03/19/2019 to recheck fluid levels as requested by Dr Sallyanne Kuster.

## 2019-02-26 ENCOUNTER — Other Ambulatory Visit: Payer: Self-pay | Admitting: Pharmacy Technician

## 2019-02-26 NOTE — Patient Outreach (Signed)
[place]  [facility]) Care Management  02/26/2019  Kimberly Downs 03/16/1946 [id number]     Follow up call placed to [employer] regarding patient assistance application(s) for Spiriva Respimat and Combivent , April confirms patient has been approved for both medications as of 6/23 until 08/30/19. Medication to arrive at patient home in 10-14 business days.  Follow up:  Will follow up with patient in 10-14 business days to confirm medication has been received.  Kimberly Downs Kimberly Downs Certified Pharmacy Technician [place] Management Direct [phone]

## 2019-02-26 NOTE — Telephone Encounter (Signed)
Opened in error

## 2019-02-27 ENCOUNTER — Ambulatory Visit: Payer: Medicare Other | Admitting: Pulmonary Disease

## 2019-02-28 ENCOUNTER — Encounter (HOSPITAL_COMMUNITY): Payer: Medicare Other

## 2019-03-01 ENCOUNTER — Other Ambulatory Visit: Payer: Self-pay | Admitting: Pharmacy Technician

## 2019-03-01 NOTE — Patient Outreach (Signed)
[facility] [facility]) Care Management  03/01/2019  Kimberly Downs 01-Apr-1946 [id number]    Follow up call placed to [employer] regarding patient assistance application(s) for Kimberly Downs confirms patient has been approved as of 6/30 until 08/30/19. Medication arrive at patients home in 10-14 business days.  Follow up:  Will follow with patient in 10-14 business days to confirm medication has been received.  [name] [name] [place] Certified Pharmacy Technician [place] Management Direct Dial:[phone]

## 2019-03-12 ENCOUNTER — Other Ambulatory Visit: Payer: Self-pay | Admitting: Pharmacy Technician

## 2019-03-12 NOTE — Patient Outreach (Signed)
[facility]) Care Management  03/12/2019  Kimberly Downs [birth year] [id number]    Incoming call from patient regarding patient assistance medication receipt from [employer], HIPAA identifiers verified. Ms. Labo states that she received Combivent (2 boxes) and Spiriva Respimat (3 boxes).  She state she has not received the [facility] from [employer] as of yet but she will be on the lookout for it.  Follow up:  Will follow up with patient in 7-10 business days to confirm [facility] has been received.  [name] [name] [place] Certified Pharmacy Technician [place] Management Direct [phone]

## 2019-03-14 ENCOUNTER — Telehealth: Payer: Self-pay | Admitting: *Deleted

## 2019-03-14 NOTE — Telephone Encounter (Signed)
A message was left, re: follow up visit. 

## 2019-03-17 LAB — FUNGUS CULTURE W SMEAR
MICRO NUMBER:: 533119
SMEAR:: NONE SEEN
SPECIMEN QUALITY:: ADEQUATE

## 2019-03-17 LAB — RESPIRATORY CULTURE OR RESPIRATORY AND SPUTUM CULTURE
MICRO NUMBER:: 533121
RESULT:: NORMAL
SPECIMEN QUALITY:: ADEQUATE

## 2019-03-17 LAB — MYCOBACTERIA,CULT W/FLUOROCHROME SMEAR
MICRO NUMBER:: 533120
SMEAR:: NONE SEEN
SPECIMEN QUALITY:: ADEQUATE

## 2019-03-19 ENCOUNTER — Ambulatory Visit (INDEPENDENT_AMBULATORY_CARE_PROVIDER_SITE_OTHER): Payer: Medicare Other

## 2019-03-19 ENCOUNTER — Other Ambulatory Visit: Payer: Self-pay | Admitting: Pharmacist

## 2019-03-19 ENCOUNTER — Other Ambulatory Visit: Payer: Self-pay | Admitting: Pharmacy Technician

## 2019-03-19 DIAGNOSIS — Z95810 Presence of automatic (implantable) cardiac defibrillator: Secondary | ICD-10-CM | POA: Diagnosis not present

## 2019-03-19 DIAGNOSIS — I5042 Chronic combined systolic (congestive) and diastolic (congestive) heart failure: Secondary | ICD-10-CM

## 2019-03-19 NOTE — Patient Outreach (Signed)
[place] [facility]) Care Management  03/19/2019  Kimberly Downs 1945-11-11 [id number]   Incoming call from patient  Stating she received her [employer] from [employer]. Reviewed with patient how to obtain refills.  Will route note to [name] for case closure.  [name] [name] [place] Certified Pharmacy Technician [place] Management Direct Dial:[phone]

## 2019-03-19 NOTE — Progress Notes (Signed)
EPIC Encounter for ICM Monitoring  Patient Name: Kimberly Downs is a 74 y.o. female Date: 03/19/2019 Primary Care Physican: Lyman Bishop, DO Primary Cardiologist: Cro[place] Electrophysiologist: Croitoru Weight: 111 lbs       Spoke with patient and provided ICM intro.  She said weight fluctuates by 5 pounds but is down today. Also the amount of leg swelling varies and some days she has a lot but there is minimal swelling today.      Optivol thoracic impedance has days that have been normal since 02/17/2019 but currently it is trending below baseline.  Fluid index has is no <threshold.   Prescribed: Torsemide 20 mg take 2 tablets (40 mg total) every day.   Patient taking differently since 6/20:  She takes Torsemide 20 mg daily and only takes 40 mg when either weight is up or she has an increase in leg swelling.  She says it is difficult to take 40 mg because it makes her feel weak and dizzy.     Recommendations: Advised will send copy of report to Dr Sallyanne Kuster and will call back if any recommendations.    Follow-up plan:   Office appt 04/12/2019 with Dr. Sallyanne Kuster.    Dr Sallyanne Kuster would you like any further ICM checks?  Copy of ICM check sent to Dr. Sallyanne Kuster.   3 month ICM trend: 03/19/2019    1 Year ICM trend:       Rosalene Billings, RN 03/19/2019 3:44 PM

## 2019-03-19 NOTE — Progress Notes (Signed)
How about we try 40 mg on Mon, Wed, Fri and 20 mg on other days of the week, please. Please repeat Optivol in 1 month.

## 2019-03-19 NOTE — Patient Outreach (Signed)
[place] [facility]) Care Management [place]  03/19/2019  Kimberly Downs 1946/02/20 [id number]  Reason for referral: medication assistance  Patient has been approved for Dulera, Combivent, and Spiriva Respimat patient assistance program(s).  Patient has been instructed on how to order refills and renewal process for 2020.   Plan: [place] case is being closed due to the following reasons: -Goals of care have been met. -Thank you for allowing [facility] pharmacy to be involved in this patient's care.   Ralene Bathe, PharmD, [place] [phone]

## 2019-03-20 MED ORDER — TORSEMIDE 20 MG PO TABS: ORAL_TABLET | ORAL | 3 refills | Status: DC

## 2019-03-20 NOTE — Progress Notes (Signed)
Call to patient and advised Dr Sallyanne Kuster recommended to take Torsemide 40 mg on Mon, Wed, Fri and 20 mg on other days of the week.  Will recheck remote transmission in a month.  Patient verbalized understanding of change in dosage. Please repeat Optivol in 1 month.

## 2019-03-23 ENCOUNTER — Telehealth (HOSPITAL_COMMUNITY): Payer: Self-pay

## 2019-03-26 NOTE — Telephone Encounter (Signed)
A second message was left, re: follow up visit.

## 2019-04-12 ENCOUNTER — Ambulatory Visit (INDEPENDENT_AMBULATORY_CARE_PROVIDER_SITE_OTHER): Payer: Medicare Other | Admitting: Cardiovascular Disease

## 2019-04-12 ENCOUNTER — Other Ambulatory Visit: Payer: Self-pay

## 2019-04-12 ENCOUNTER — Encounter: Payer: Self-pay | Admitting: Cardiovascular Disease

## 2019-04-12 VITALS — BP 147/89 | HR 70 | Ht 59.0 in | Wt 113.0 lb

## 2019-04-12 DIAGNOSIS — I5042 Chronic combined systolic (congestive) and diastolic (congestive) heart failure: Secondary | ICD-10-CM | POA: Diagnosis not present

## 2019-04-12 DIAGNOSIS — I10 Essential (primary) hypertension: Secondary | ICD-10-CM

## 2019-04-12 DIAGNOSIS — I480 Paroxysmal atrial fibrillation: Secondary | ICD-10-CM

## 2019-04-12 DIAGNOSIS — Z87898 Personal history of other specified conditions: Secondary | ICD-10-CM

## 2019-04-12 DIAGNOSIS — E119 Type 2 diabetes mellitus without complications: Secondary | ICD-10-CM

## 2019-04-12 DIAGNOSIS — I2510 Atherosclerotic heart disease of native coronary artery without angina pectoris: Secondary | ICD-10-CM

## 2019-04-12 DIAGNOSIS — E7800 Pure hypercholesterolemia, unspecified: Secondary | ICD-10-CM

## 2019-04-12 DIAGNOSIS — Z95810 Presence of automatic (implantable) cardiac defibrillator: Secondary | ICD-10-CM

## 2019-04-12 DIAGNOSIS — I739 Peripheral vascular disease, unspecified: Secondary | ICD-10-CM

## 2019-04-12 MED ORDER — LOSARTAN POTASSIUM 50 MG PO TABS: 50.0000 mg | ORAL_TABLET | Freq: Every day | ORAL | 3 refills | Status: DC
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
## 2019-04-12 NOTE — Patient Instructions (Signed)
Medication Instructions:  INCREASE the Losartan to 50 mg once daily  If you need a refill on your cardiac medications before your next appointment, please call your pharmacy.   Lab work: None ordered If you have labs (blood work) drawn today and your tests are completely normal, you will receive your results only by: [name] MyChart Message (if you have MyChart) OR . A paper copy in the mail If you have any lab test that is abnormal or we need to change your treatment, we will call you to review the results.  Testing/Procedures: None ordered  Follow-Up: At [facility], you and your health needs are our priority.  As part of our continuing mission to provide you with exceptional heart care, we have created designated Provider Care Teams.  These Care Teams include your primary Cardiologist (physician) and Advanced Practice Providers (APPs -  Physician Assistants and Nurse Practitioners) who all work together to provide you with the care you need, when you need it. You will need a follow up appointment in 12 months.  Please call our office 2 months in advance to schedule this appointment.  You may see Sanda Klein, MD or one of the following Advanced Practice Providers on your designated Care Team: [place], [place] . Fabian Sharp, PA-C  Any Other Special Instructions Will Be Listed Below (If Applicable). Please call the office in early September with a week's worth of blood pressure and heart rate readings.

## 2019-04-16 ENCOUNTER — Telehealth: Payer: Self-pay | Admitting: Cardiovascular Disease

## 2019-04-16 ENCOUNTER — Ambulatory Visit (INDEPENDENT_AMBULATORY_CARE_PROVIDER_SITE_OTHER): Payer: Medicare Other

## 2019-04-16 DIAGNOSIS — Z95810 Presence of automatic (implantable) cardiac defibrillator: Secondary | ICD-10-CM

## 2019-04-16 DIAGNOSIS — I5022 Chronic systolic (congestive) heart failure: Secondary | ICD-10-CM

## 2019-04-16 NOTE — Progress Notes (Signed)
EPIC Encounter for ICM Monitoring  Patient Name: Kimberly Downs is a 73 y.o. female Date: 04/16/2019 Primary Care Physican: Lyman Bishop, DO Primary Cardiologist: [place] Electrophysiologist: Cro[place]u Previous Weight: 111 lbs 04/16/2019 Weight: 113 lbs                                                           Spoke with patient.  She's had more leg swelling in the past 2 weeks but there is improvement today.  Weight has increased the last couple of days.  Confirmed she is taking Torsemide as prescribed.    Optivol thoracic impedance suggesting ongoing fluid accumulation since 04/06/2019.  Fluid index is below threshold.   Prescribed: Torsemide 20 mg take 2 tablets (40 mg total) on Mon, Wed, Fri and 1 tablet (20 mg total) on other days of the week.     Recommendations: ICM copy of report to Dr Sallyanne Kuster and will call back if any recommendations.   Advised patient if leg swelling or weight gain continues to call back.   Follow-up plan:  Follow up with Dr Sallyanne Kuster if ICM monthly fluid checks.  Copy of ICM check sent to Dr. Sallyanne Kuster.   3 month ICM trend: 04/16/2019    1 Year ICM trend:       Rosalene Billings, RN 04/16/2019 12:18 PM

## 2019-04-16 NOTE — Progress Notes (Signed)
Thanks. When I saw her in clinic last week she did not have any swelling at all.

## 2019-04-16 NOTE — Telephone Encounter (Signed)
New Message   Pt c/o medication issue:  1. Name of Medication: losartan (COZAAR) 50 MG tablet  2. How are you currently taking this medication (dosage and times per day)? Take 1 tablet (50 mg total) by mouth daily, was taking 25 mg before appointment on 04/12/19   3. Are you having a reaction (difficulty breathing--STAT)? No  4. What is your medication issue? Patient states that she has not been feeling well, throwing up each night since the medication has been increased. Patient has had a blood pressure increased since the medication has been increased.    BP:  156/93 (04/11/19) 160/89  (04/12/19) 144/83 (04/13/19) 180/81 166/92 (04/15/19) 161/104 (04/16/19)

## 2019-04-16 NOTE — Telephone Encounter (Signed)
Returned the call to the patient. She stated that she has been stomach sick for the past 3 days and wonders if it could be the increase in the Losartan. This was increased on 8/13 to 50 mg once daily.  She currently takes Losartan 50 mg in the morning and Carvedilol 6.25 mg bid.   This morning her blood pressure was 161/104. This was before she took her medication. The other readings listed below were also taken prior to her blood pressure medications.   She was asked to check her blood pressure while on the phone at it was 122/60. She has been asked to check it again tonight after her carvedilol and then tomorrow morning 1-2 hours after her medications and to call the office back.  She has been advised to try and stay hydrated. If the nausea is not better then she has been advised to call her PCP or go to an Urgent Care to avoid dehydration. She has verbalized her understanding.

## 2019-04-16 NOTE — Telephone Encounter (Signed)
We could try splitting the loser tan in 2 daily doses of 25 mg to see if that helps. This would not be an expected side effect of this medication, though.

## 2019-04-17 NOTE — Telephone Encounter (Signed)
Left a message for the patient to call back.  

## 2019-04-18 NOTE — Progress Notes (Signed)
We can switch to every 3 months. Thanks, she is doing much better.

## 2019-04-20 MED ORDER — LOSARTAN POTASSIUM 25 MG PO TABS: 25.0000 mg | ORAL_TABLET | Freq: Two times a day (BID) | ORAL | Status: DC

## 2019-04-20 NOTE — Telephone Encounter (Signed)
The patient has been made aware and will change her Losartan to 25 mg bid instead of taking the 50 mg all at once.

## 2019-05-01 ENCOUNTER — Telehealth: Payer: Self-pay | Admitting: Neurology

## 2019-05-01 NOTE — Telephone Encounter (Signed)
Left message for pt to return call.

## 2019-05-01 NOTE — Telephone Encounter (Signed)
Pt states that Sunday she had a episode that she was not able to respond or use arms or legs. The arms and legs were like dead weight. She states that she could hear her talking to her but could not answer her for a little bit. She went to the [facility] in [place] since Sunday. She would like to speak to someone about what is going on with her

## 2019-05-02 NOTE — Telephone Encounter (Signed)
Spoke with pt. She is at a [facility] in [place].  She states that the physicians are diagnosing her with a blood clot in her lung.  Pt is feeling much better. Instructed her that I would request medical records.  She does have a follow up in October with Dr. Delice Lesch but I will ask if she needs to be seen sooner.

## 2019-05-02 NOTE — Telephone Encounter (Signed)
If she was diagnosed with bloodclot in her lung, this was most likely cause of symptoms. For now, f/u as scheduled, call if any changes. Thanks

## 2019-05-03 ENCOUNTER — Ambulatory Visit: Payer: Medicare Other | Admitting: Pulmonary Disease

## 2019-05-21 ENCOUNTER — Ambulatory Visit (INDEPENDENT_AMBULATORY_CARE_PROVIDER_SITE_OTHER): Payer: Medicare Other | Admitting: *Deleted

## 2019-05-21 DIAGNOSIS — I469 Cardiac arrest, cause unspecified: Secondary | ICD-10-CM

## 2019-05-22 LAB — CUP PACEART REMOTE DEVICE CHECK
Battery Remaining Longevity: 127 mo
Battery Voltage: 2.99 V
Brady Statistic RV Percent Paced: 0.05 %
Date Time Interrogation Session: 20200922104825
HighPow Impedance: 85 Ohm
Implantable Lead Implant Date: 20181128
Implantable Lead Location: 753860
Implantable Pulse Generator Implant Date: 20181128
Lead Channel Impedance Value: 266 Ohm
Lead Channel Impedance Value: 361 Ohm
Lead Channel Pacing Threshold Amplitude: 1.125 V
Lead Channel Pacing Threshold Pulse Width: 0.4 ms
Lead Channel Sensing Intrinsic Amplitude: 11 mV
Lead Channel Sensing Intrinsic Amplitude: 11 mV
Lead Channel Setting Pacing Amplitude: 2.25 V
Lead Channel Setting Pacing Pulse Width: 0.4 ms
Lead Channel Setting Sensing Sensitivity: 0.3 mV

## 2019-05-28 ENCOUNTER — Encounter: Payer: Self-pay | Admitting: Cardiology

## 2019-05-28 NOTE — Progress Notes (Signed)
Remote ICD transmission.   

## 2019-05-31 ENCOUNTER — Telehealth: Payer: Self-pay | Admitting: Pulmonary Disease

## 2019-05-31 NOTE — Telephone Encounter (Signed)
-----   Message from Magdalen Spatz, NP sent at 05/31/2019 12:27 PM EDT ----- Regarding: RE: LCS CT due 05/2019 Aaron Edelman, please see if you can talk her into getting the screening scan. Thanks ----- Message ----- From: Tana [place] Sent: 05/28/2019   5:05 PM EDT To: Magdalen Spatz, NP, Christie Beckers, RN Subject: LCS CT due 05/2019                              I called Kimberly Downs 04/18/2019 to schedule this CT. She ask that I call her back the end of September. I called Kimberly Downs today to try and schedule this CT and she stated that she didn't want to schedule the CT for this year. She stated that she would still see Aaron Edelman  Thanks Rodena Piety

## 2019-05-31 NOTE — Telephone Encounter (Signed)
Thank you.  Noted.  Will route note to Dr. Carlis Abbott as Juluis Rainier.Paige,Let me know if you have any questions regarding this patient of seen her quite a few times in the past.  Unfortunately right now she is resistant to doing a repeat lung cancer screening.  Wyn Quaker, FNP

## 2019-05-31 NOTE — Telephone Encounter (Signed)
05/31/2019 1235  Triage, please see the information listed below.  Please contact the patient and see if we get her scheduled for a office visit.  Patient has completed tele-visits in the past.  Would like for this to be in person.  Patient can have follow-up with me.  But also needs to be scheduled with a new pulmonary provider as Dr. Lake Bells is no longer working in our clinic.  He is remaining on the inpatient side working at [facility] [place] [facility].  Please also schedule the patient in 6 to 8 weeks in a 30-minute slot to establish with Dr. Carlis Abbott.  We will route to SG as Geralynn Rile, FNP

## 2019-05-31 NOTE — Telephone Encounter (Signed)
Called spoke with patient. I asked her if she would get the CT she states "absolutely not". I asked if she would share with me why. She stated sure "no matter what is found, I am not a candidate for any treatments or medications and not sure if I would partake in them if I were". I told her thank you and I would share this with Kimberly Downs and Kimberly Downs.   Tried to get back in to see Kimberly Downs and Kimberly Downs, she wasn't coming to two appointments not even 6 weeks apart. I scheduled with Kimberly Downs for mid November, per Patient request.  Nothing further needed at this time.

## 2019-06-01 ENCOUNTER — Telehealth: Payer: Self-pay | Admitting: Critical Care Medicine

## 2019-06-01 NOTE — Telephone Encounter (Signed)
Noted.  Thank you so much.Kimberly Downs

## 2019-06-28 ENCOUNTER — Ambulatory Visit: Payer: Medicare Other | Admitting: Neurology

## 2019-06-29 ENCOUNTER — Encounter: Payer: Self-pay | Admitting: Neurology

## 2019-06-29 ENCOUNTER — Ambulatory Visit: Payer: Medicare Other | Admitting: Neurology

## 2019-06-29 ENCOUNTER — Other Ambulatory Visit: Payer: Self-pay

## 2019-06-29 VITALS — BP 166/72 | HR 70 | Ht 59.0 in | Wt 108.0 lb

## 2019-06-29 DIAGNOSIS — R55 Syncope and collapse: Secondary | ICD-10-CM | POA: Diagnosis not present

## 2019-06-29 DIAGNOSIS — F0781 Postconcussional syndrome: Secondary | ICD-10-CM

## 2019-06-29 DIAGNOSIS — G629 Polyneuropathy, unspecified: Secondary | ICD-10-CM

## 2019-06-29 MED ORDER — AMITRIPTYLINE HCL 10 MG PO TABS: 20.0000 mg | ORAL_TABLET | Freq: Every day | ORAL | 3 refills | Status: DC

## 2019-06-29 MED ORDER — LEVETIRACETAM 500 MG PO TABS: 500.0000 mg | ORAL_TABLET | Freq: Two times a day (BID) | ORAL | 3 refills | Status: DC

## 2019-06-29 NOTE — Patient Instructions (Signed)
Great seeing you! Continue all your medications. Follow-up in 8 months, call for any changes  Seizure Precautions: 1. If medication has been prescribed for you to prevent seizures, take it exactly as directed.  Do not stop taking the medicine without talking to your doctor first, even if you have not had a seizure in a long time.   2. Avoid activities in which a seizure would cause danger to yourself or to others.  Don't operate dangerous machinery, swim alone, or climb in high or dangerous places, such as on ladders, roofs, or girders.  Do not drive unless your doctor says you may.  3. If you have any warning that you may have a seizure, lay down in a safe place where you can't hurt yourself.    4.  No driving for 6 months from last seizure, as per [facility].   Please refer to the following link on the [place] website for more information: http://www.epilepsyfoundation.org/answerplace/Social/driving/drivingu.cfm   5.  Maintain good sleep hygiene. Avoid alcohol.  6.  Contact your doctor if you have any problems that may be related to the medicine you are taking.  7.  Call 911 and bring the patient back to the ED if:        A.  The seizure lasts longer than 5 minutes.       B.  The patient doesn't awaken shortly after the seizure  C.  The patient has new problems such as difficulty seeing, speaking or moving  D.  The patient was injured during the seizure  E.  The patient has a temperature over 102 F (39C)  F.  The patient vomited and now is having trouble breathing
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
## 2019-07-09 ENCOUNTER — Encounter: Payer: Self-pay | Admitting: Neurology

## 2019-07-18 ENCOUNTER — Ambulatory Visit: Payer: Medicare Other | Admitting: Critical Care Medicine

## 2019-07-18 ENCOUNTER — Other Ambulatory Visit: Payer: Self-pay

## 2019-07-18 ENCOUNTER — Encounter: Payer: Self-pay | Admitting: Critical Care Medicine

## 2019-07-18 VITALS — BP 122/66 | HR 63 | Temp 98.0°F | Ht 59.0 in | Wt 108.0 lb

## 2019-07-18 DIAGNOSIS — Z23 Encounter for immunization: Secondary | ICD-10-CM | POA: Diagnosis not present

## 2019-07-18 DIAGNOSIS — J479 Bronchiectasis, uncomplicated: Secondary | ICD-10-CM

## 2019-07-18 DIAGNOSIS — R918 Other nonspecific abnormal finding of lung field: Secondary | ICD-10-CM

## 2019-07-18 DIAGNOSIS — J449 Chronic obstructive pulmonary disease, unspecified: Secondary | ICD-10-CM

## 2019-07-18 DIAGNOSIS — Z720 Tobacco use: Secondary | ICD-10-CM

## 2019-07-18 MED ORDER — SPIRIVA RESPIMAT 2.5 MCG/ACT IN AERS: 2.0000 | INHALATION_SPRAY | Freq: Every day | RESPIRATORY_TRACT | 3 refills | Status: DC

## 2019-07-18 MED ORDER — IPRATROPIUM-ALBUTEROL 20-100 MCG/ACT IN AERS: 1.0000 | INHALATION_SPRAY | Freq: Four times a day (QID) | RESPIRATORY_TRACT | 3 refills | Status: DC

## 2019-07-18 MED ORDER — MOMETASONE FURO-FORMOTEROL FUM 200-5 MCG/ACT IN AERO: 2.0000 | INHALATION_SPRAY | Freq: Two times a day (BID) | RESPIRATORY_TRACT | 3 refills | Status: AC

## 2019-07-18 NOTE — Patient Instructions (Addendum)
Thank you for visiting Dr. Carlis Abbott at [facility]. We recommend the following:   Meds ordered this encounter  Medications  . mometasone-formoterol (DULERA) 200-5 MCG/ACT AERO    Sig: Inhale 2 puffs into the lungs 2 (two) times daily.    Dispense:  13 g    Refill:  3    Dispense 3 inhalers (94-month supply) with 3 refills.    Order Specific Question:   Lot Number?    Answer:   [id number]    Order Specific Question:   Expiration Date?    Answer:   03/13/2020    Order Specific Question:   Manufacturer?    Answer:   [employer] [[id number]]    Order Specific Question:   Quantity    Answer:   2  . Ipratropium-Albuterol (COMBIVENT) 20-100 MCG/ACT AERS respimat    Sig: Inhale 1 puff into the lungs every 6 (six) hours.    Dispense:  12 g    Refill:  3    Dispense 3 inhalers (53-month supply) with 3 refills.  . Tiotropium Bromide Monohydrate (SPIRIVA RESPIMAT) 2.5 MCG/ACT AERS    Sig: Inhale 2 puffs into the lungs daily.    Dispense:  12 g    Refill:  3    Dispense 3 inhalers (21-month supply) with 3 refills.    Order Specific Question:   Lot Number?    Answer:   [id number]    Order Specific Question:   Expiration Date?    Answer:   10/28/2020    Order Specific Question:   Manufacturer?    Answer:   [employer] [[id number]]    Order Specific Question:   Quantity    Answer:   2    Return in about 6 months (around 01/15/2020).    Please do your part to reduce the spread of COVID-19.  It is very important that you stop smoking or vaping. This is the single most important thing that you can do to improve your lung health.   S = Set a quit date. T = Tell family, friends, and the people around you that you plan to quit. A = Anticipate or plan ahead for the tough times you'll face while quitting. R = Remove cigarettes and other tobacco products from your home, car, and work. T = Talk to [place] about getting help to quit.  If you need help, please reach out to our office or the smoking cessation  resources available: [place] Smoking Cessation Class: 510-258-5277 1-800-QUIT-NOW www.BeTobaccoFree.gov
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
## 2019-08-20 ENCOUNTER — Ambulatory Visit (INDEPENDENT_AMBULATORY_CARE_PROVIDER_SITE_OTHER): Payer: Medicare Other | Admitting: *Deleted

## 2019-08-20 DIAGNOSIS — I4891 Unspecified atrial fibrillation: Secondary | ICD-10-CM

## 2019-08-20 LAB — CUP PACEART REMOTE DEVICE CHECK
Battery Remaining Longevity: 125 mo
Battery Voltage: 2.99 V
Brady Statistic RV Percent Paced: 0.09 %
Date Time Interrogation Session: 20201221012503
HighPow Impedance: 82 Ohm
Implantable Lead Implant Date: 20181128
Implantable Lead Location: 753860
Implantable Pulse Generator Implant Date: 20181128
Lead Channel Impedance Value: 266 Ohm
Lead Channel Impedance Value: 323 Ohm
Lead Channel Pacing Threshold Amplitude: 1.375 V
Lead Channel Pacing Threshold Pulse Width: 0.4 ms
Lead Channel Sensing Intrinsic Amplitude: 14.125 mV
Lead Channel Sensing Intrinsic Amplitude: 14.125 mV
Lead Channel Setting Pacing Amplitude: 3 V
Lead Channel Setting Pacing Pulse Width: 0.4 ms
Lead Channel Setting Sensing Sensitivity: 0.3 mV

## 2019-10-02 ENCOUNTER — Telehealth: Payer: Self-pay | Admitting: Critical Care Medicine

## 2019-10-02 DIAGNOSIS — J449 Chronic obstructive pulmonary disease, unspecified: Secondary | ICD-10-CM

## 2019-10-02 NOTE — Telephone Encounter (Signed)
Spoke with pt, She states she needs a new Rx to be sent to [facility] for a year supply. I called the number to send in the Combivent and Spiriva but was on hold for over 10 minutes. Will try again later.   Patient Instructions by Julian Hy, DO at 07/18/2019 1:30 PM Author: Julian Hy, DO Author Type: Physician Filed: 07/18/2019 1:56 PM  Note Status: Addendum Mickle Mallory: Cosign Not Required Encounter Date: 07/18/2019  Editor: Julian Hy, DO (Physician)  Prior Versions: 1. Julian Hy, DO (Physician) at 07/18/2019 1:55 PM - Signed    Thank you for visiting Dr. Carlis Abbott at [facility]. We recommend the following:        Meds ordered this encounter  Medications  . mometasone-formoterol (DULERA) 200-5 MCG/ACT AERO    Sig: Inhale 2 puffs into the lungs 2 (two) times daily.    Dispense:  13 g    Refill:  3    Dispense 3 inhalers (74-month supply) with 3 refills.    Order Specific Question:   Lot Number?    Answer:   [id number]    Order Specific Question:   Expiration Date?    Answer:   03/13/2020    Order Specific Question:   Manufacturer?    Answer:   [place] [[id number]]    Order Specific Question:   Quantity    Answer:   2  . Ipratropium-Albuterol (COMBIVENT) 20-100 MCG/ACT AERS respimat    Sig: Inhale 1 puff into the lungs every 6 (six) hours.    Dispense:  12 g    Refill:  3    Dispense 3 inhalers (56-month supply) with 3 refills.  . Tiotropium Bromide Monohydrate (SPIRIVA RESPIMAT) 2.5 MCG/ACT AERS    Sig: Inhale 2 puffs into the lungs daily.    Dispense:  12 g    Refill:  3    Dispense 3 inhalers (42-month supply) with 3 refills.    Order Specific Question:   Lot Number?    Answer:   [id number]    Order Specific Question:   Expiration Date?    Answer:   10/28/2020    Order Specific Question:   Manufacturer?    Answer:   [place] [[id number]]    Order Specific Question:   Quantity    Answer:   2    Return in  about 6 months (around 01/15/2020).

## 2019-10-03 MED ORDER — IPRATROPIUM-ALBUTEROL 20-100 MCG/ACT IN AERS: 1.0000 | INHALATION_SPRAY | Freq: Four times a day (QID) | RESPIRATORY_TRACT | 3 refills | Status: AC

## 2019-10-03 MED ORDER — SPIRIVA RESPIMAT 2.5 MCG/ACT IN AERS: 2.0000 | INHALATION_SPRAY | Freq: Every day | RESPIRATORY_TRACT | 3 refills | Status: DC

## 2019-10-03 NOTE — Telephone Encounter (Signed)
Called [facility] and gave verbal order for both Combivent and Spiriva. lmtcb for pt.

## 2019-10-04 NOTE — Telephone Encounter (Signed)
Called and spoke with pt letting her know that we did call [facility] and gave verbal orders for both the combivent and spiriva inhalers. Pt verbalized understanding. Nothing further needed.

## 2019-10-08 ENCOUNTER — Telehealth: Payer: Self-pay

## 2019-10-08 ENCOUNTER — Other Ambulatory Visit: Payer: Self-pay | Admitting: Cardiology

## 2019-10-08 NOTE — Telephone Encounter (Signed)
The pt states her monitor do not work and she do not want to pay for something that does not work. I tried to explain that her monitor is automatic and it sent a transmission on 08/20/2019. The pt states she unplug the monitor and it does not work. I let her talk to the device nurse [name], [place].

## 2019-10-08 NOTE — Telephone Encounter (Signed)
Spoke with patient who was upset because she has been charged for a transmission that was documented on 08/20/19. Patient reports the monitor has been unplugged since September 2020 and she does not want a monitor that does not work. I explained to the patient that if someone plugged the monitor back in then we would receive a transmission automatically. The patient was angry and wanted to know why we had not noticed the monitor was not working. I explained to her that the 91 day transmissions had been received in September and December and that the next one would not be expected to be received until around 2/ 21/21.  She wants to discontinue remote monitoring . I explained to her that if she discontinues remote monitoring then she will need to come in to the office every 3 months for in person ICD checks to ensure device function WNL and to assess for events. She declines remote monitoring and  in person checks. Educated on importanvce of monitoring device and patient due to history of cardiac arrest but patient declines monitoring in person or by remote.  Will forward to Dr Recardo Evangelist to make him aware .

## 2019-10-09 ENCOUNTER — Telehealth: Payer: Self-pay | Admitting: Pulmonary Disease

## 2019-10-09 NOTE — Telephone Encounter (Signed)
Rx has been sent to the pharmacy electronically. ° °

## 2019-10-09 NOTE — Telephone Encounter (Signed)
Received a fax from [facility] stating that the patient's application for patient assistance for Spiriva and Combivent has been denied. The reason for the denial is the patient had too much income last year. She will be eligible to re-apply to [facility] after 10/07/2020.   Will call the patient to see if she is aware of this.

## 2019-12-03 ENCOUNTER — Other Ambulatory Visit: Payer: Self-pay | Admitting: Cardiovascular Disease

## 2019-12-08 ENCOUNTER — Other Ambulatory Visit: Payer: Self-pay | Admitting: Adult Health

## 2020-01-03 ENCOUNTER — Telehealth: Payer: Self-pay | Admitting: Cardiovascular Disease

## 2020-01-03 NOTE — Telephone Encounter (Signed)
Spoke with patient. She reports she called to get a refill on her Trental and when she contacted her PCP they said Dr. Sallyanne Kuster had sent them a message advising that she had been told to discontinue the Trental. She was told by the PCP office she would have to contact Dr. Victorino December office to get the medication reinstated. She reports for the last 3 days things have progressed and she is now having difficulty standing for long periods of time and walking. She reports chronic edema but states today she feels like her legs "weigh a ton."   Chart review still shows Trental 400mg  as active in our system.   Spoke with Caryl Pina at [facility] who reports Dr. Curly Rim wrote that it was stopped on 08/03/2018 by Dr. Sallyanne Kuster as it can be disadvantageous. Note from 08/03/2018 does show it was stopped. Patient has continued to take the medication. Dr. Otilio Carpen office would like decision to be made and to be notified of result.  Will route to Dr. Sallyanne Kuster for review.

## 2020-01-03 NOTE — Telephone Encounter (Signed)
Patient states she was taken off pentoxifylline (TRENTAL) CR tablet 400 mg she states that it is getting hard for her to stand for long and for her to walk. She states her legs feel like they weigh a ton.

## 2020-01-04 NOTE — Telephone Encounter (Signed)
Left a message for the patient to call back for clarification.

## 2020-01-04 NOTE — Telephone Encounter (Signed)
If it was stopped by her PCP in 2019 we will not restart it.

## 2020-01-04 NOTE — Telephone Encounter (Signed)
Spoke with the patient about the Trental. She was advised that this medication was originally discontinued in 07/2018 at her office visit. She stated that she never stopped taking it.   It was again advised at her appointment in 2020  that she could discontinue this and why it should be stopped:  PAD: She currently denies claudication.  In my opinion she does not need pentoxifylline, which may be disadvantageous with CAD and CHF.  She stated that her PCP (Dr. Truman Hayward) has been filling it all this time but that PCP is now on a leave of absence. The PCP filling in has advised her that cardiology will need to fill it since cardiology has advised to discontinue this.  She stated that she has been off of the medication for 10 days now and since then has had increasing problems with her legs. She has complaints of a "heavy feeling with a burning sensation." She denies any tempertaure change or discoloration minus bruising on the front of her legs, bilaterally.   She had vascular studies completed at [facility] on 5/5 with Dr. Rachell Cipro.

## 2020-01-06 NOTE — Telephone Encounter (Signed)
DC Trental

## 2020-01-07 NOTE — Telephone Encounter (Signed)
Pt called back returning Lisa's call

## 2020-01-07 NOTE — Telephone Encounter (Signed)
The patient has been made aware and verbalized her understanding. Trental has been taken off of her medication list.

## 2020-01-07 NOTE — Telephone Encounter (Signed)
Left a message for the patient to call back.  

## 2020-01-16 ENCOUNTER — Telehealth: Payer: Self-pay | Admitting: Cardiovascular Disease

## 2020-01-16 NOTE — Telephone Encounter (Signed)
PT is suppose to be taking Spironolactone 12.5 mg every day and has not was only taking as needed Pt will start taking daily Pharmacy was checking to see if K should be followed since pt is taking K supplement . Informed pharmacists that PCP can order labs as well Pharmacist to send message to PCP Will forward to Dr Sallyanne Kuster for review and recommendations ./cy

## 2020-01-16 NOTE — Telephone Encounter (Signed)
Pt c/o medication issue:  1. Name of Medication: spironolactone (ALDACTONE) 25 MG tablet  2. How are you currently taking this medication (dosage and times per day)? 25 mg as needed (when extra fluid is present)  3. Are you having a reaction (difficulty breathing--STAT)? no  4. What is your medication issue? [place] [facility] wanted to notify us of how the patient is taking the medication. [place] also wanted to know if that is still how the patient should be taking it. This medication can affect the patients potassium levels. Please contact Caitlin when Dr. Loletha Grayer has a chance to address this

## 2020-01-17 NOTE — Telephone Encounter (Signed)
Call placed to the patient to reconcile her medication list due to discrepancies. Spironolactone was not listed on her medication list as an active medication. The patient stated she only took it as needed but was not sure who prescribed it for her.   Medications added: Torsemide 20 mg daily, takes an extra 20 mg for swelling and increased weight. Spironolactone 12.5 mg once daily Rosuvastatin 10 mg once daily  Medications patient not taking: Ferrous Sulfate Imdur Potassium (she stated it made her sick) Atorvastatin.  Call placed to [facility] the pharmacist at [place]. We have gone over her medication list and have everything is updated. She was concerned about the patient taking the spironolactone since she is on Torsemide and not taking her potassium supplement without any recent labs. She was advised that we didn't have any diuretics on the medication list. She will have the patient come in next week for a BMET and will have those labs faxed to Dr. Sallyanne Kuster.   For right now, the patient will stay off of the spironolactone since she was only taking it as needed. She does have the extra torsemide as needed as well. She stated that she does not have any edema or shortness of breath. The patient has verbalized her understanding and she is due for a follow up with Dr. Sallyanne Kuster in August.

## 2020-02-19 ENCOUNTER — Telehealth: Payer: Self-pay

## 2020-02-19 NOTE — Telephone Encounter (Signed)
Spoke with patient to remind of missed remote transmission 

## 2020-02-29 ENCOUNTER — Ambulatory Visit: Payer: Medicare Other | Admitting: Neurology

## 2020-04-18 ENCOUNTER — Encounter: Payer: Self-pay | Admitting: Neurology

## 2020-04-18 ENCOUNTER — Ambulatory Visit: Payer: Medicare Other | Admitting: Neurology

## 2020-04-18 ENCOUNTER — Other Ambulatory Visit: Payer: Self-pay

## 2020-04-18 VITALS — BP 116/62 | HR 80 | Ht 59.0 in | Wt 103.0 lb

## 2020-04-18 DIAGNOSIS — R519 Headache, unspecified: Secondary | ICD-10-CM

## 2020-04-18 DIAGNOSIS — R55 Syncope and collapse: Secondary | ICD-10-CM

## 2020-04-18 DIAGNOSIS — F0781 Postconcussional syndrome: Secondary | ICD-10-CM | POA: Diagnosis not present

## 2020-04-18 DIAGNOSIS — G252 Other specified forms of tremor: Secondary | ICD-10-CM | POA: Diagnosis not present

## 2020-04-18 MED ORDER — LEVETIRACETAM 500 MG PO TABS: 500.0000 mg | ORAL_TABLET | Freq: Two times a day (BID) | ORAL | 3 refills | Status: DC

## 2020-04-18 MED ORDER — AMITRIPTYLINE HCL 10 MG PO TABS: ORAL_TABLET | ORAL | 3 refills | Status: DC
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
## 2020-04-18 NOTE — Patient Instructions (Signed)
1. Increase amitriptyline 10mg : Take 3 tablets every night. If too drowsy the next morning, take earlier in the evening.  2. Proceed with Physical therapy and Occupational therapy  3. Continue all other medications  4. As per  driving laws, no driving after an episode of loss of consciousness until 6 months event-free

## 2020-05-30 ENCOUNTER — Ambulatory Visit (INDEPENDENT_AMBULATORY_CARE_PROVIDER_SITE_OTHER): Payer: Medicare Other

## 2020-05-30 DIAGNOSIS — I469 Cardiac arrest, cause unspecified: Secondary | ICD-10-CM | POA: Diagnosis not present

## 2020-06-01 LAB — CUP PACEART REMOTE DEVICE CHECK
Battery Remaining Longevity: 119 mo
Battery Voltage: 2.98 V
Brady Statistic RV Percent Paced: 0.08 %
Date Time Interrogation Session: 20211001180840
HighPow Impedance: 67 Ohm
Implantable Lead Implant Date: 20181128
Implantable Lead Location: 753860
Implantable Pulse Generator Implant Date: 20181128
Lead Channel Impedance Value: 266 Ohm
Lead Channel Impedance Value: 323 Ohm
Lead Channel Pacing Threshold Amplitude: 2 V
Lead Channel Pacing Threshold Pulse Width: 0.4 ms
Lead Channel Sensing Intrinsic Amplitude: 12 mV
Lead Channel Sensing Intrinsic Amplitude: 12 mV
Lead Channel Setting Pacing Amplitude: 4 V
Lead Channel Setting Pacing Pulse Width: 0.4 ms
Lead Channel Setting Sensing Sensitivity: 0.3 mV

## 2020-06-02 NOTE — Progress Notes (Signed)
Remote ICD transmission.   

## 2020-06-06 ENCOUNTER — Other Ambulatory Visit: Payer: Self-pay | Admitting: Cardiovascular Disease

## 2020-07-17 ENCOUNTER — Encounter: Payer: Self-pay | Admitting: Cardiovascular Disease

## 2020-07-17 ENCOUNTER — Ambulatory Visit (INDEPENDENT_AMBULATORY_CARE_PROVIDER_SITE_OTHER): Payer: Medicare Other | Admitting: Cardiovascular Disease

## 2020-07-17 ENCOUNTER — Other Ambulatory Visit: Payer: Self-pay

## 2020-07-17 VITALS — BP 156/77 | HR 65 | Ht 59.0 in | Wt 102.0 lb

## 2020-07-17 DIAGNOSIS — I2510 Atherosclerotic heart disease of native coronary artery without angina pectoris: Secondary | ICD-10-CM | POA: Diagnosis not present

## 2020-07-17 DIAGNOSIS — I739 Peripheral vascular disease, unspecified: Secondary | ICD-10-CM

## 2020-07-17 DIAGNOSIS — Z95810 Presence of automatic (implantable) cardiac defibrillator: Secondary | ICD-10-CM

## 2020-07-17 DIAGNOSIS — I480 Paroxysmal atrial fibrillation: Secondary | ICD-10-CM

## 2020-07-17 DIAGNOSIS — R55 Syncope and collapse: Secondary | ICD-10-CM

## 2020-07-17 DIAGNOSIS — E7800 Pure hypercholesterolemia, unspecified: Secondary | ICD-10-CM

## 2020-07-17 DIAGNOSIS — I5022 Chronic systolic (congestive) heart failure: Secondary | ICD-10-CM | POA: Diagnosis not present

## 2020-07-17 DIAGNOSIS — J441 Chronic obstructive pulmonary disease with (acute) exacerbation: Secondary | ICD-10-CM

## 2020-07-17 DIAGNOSIS — E1121 Type 2 diabetes mellitus with diabetic nephropathy: Secondary | ICD-10-CM

## 2020-07-17 DIAGNOSIS — I10 Essential (primary) hypertension: Secondary | ICD-10-CM

## 2020-07-17 MED ORDER — TORSEMIDE 20 MG PO TABS: 20.0000 mg | ORAL_TABLET | Freq: Every day | ORAL | 11 refills | Status: DC

## 2020-07-17 NOTE — Patient Instructions (Signed)
Medication Instructions:  FUROSEMIDE: Take 20 mg once daily. Take an extra 20 mg tablet for a weight over 100 pounds  *If you need a refill on your cardiac medications before your next appointment, please call your pharmacy*   Lab Work: None ordered If you have labs (blood work) drawn today and your tests are completely normal, you will receive your results only by:  [place] (if you have MyChart) OR  A paper copy in the mail If you have any lab test that is abnormal or we need to change your treatment, we will call you to review the results.   Testing/Procedures: None ordered   Follow-Up: At [facility], you and your health needs are our priority.  As part of our continuing mission to provide you with exceptional heart care, we have created designated Provider Care Teams.  These Care Teams include your primary Cardiologist (physician) and Advanced Practice Providers (APPs -  Physician Assistants and Nurse Practitioners) who all work together to provide you with the care you need, when you need it.  We recommend signing up for the patient portal called "MyChart".  Sign up information is provided on this After Visit Summary.  MyChart is used to connect with patients for Virtual Visits (Telemedicine).  Patients are able to view lab/test results, encounter notes, upcoming appointments, etc.  Non-urgent messages can be sent to your provider as well.   To learn more about what you can do with MyChart, go to [email or web address].    Your next appointment:   12 month(s)  The format for your next appointment:   In Person  Provider:   Sanda Klein, MD
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
## 2020-07-29 ENCOUNTER — Other Ambulatory Visit: Payer: Self-pay

## 2020-07-29 MED ORDER — LOSARTAN POTASSIUM 25 MG PO TABS
25.0000 mg | ORAL_TABLET | Freq: Two times a day (BID) | ORAL | 2 refills | Status: DC
Start: 2020-07-29 — End: 2021-05-05

## 2020-07-29 MED ORDER — CARVEDILOL 6.25 MG PO TABS
6.2500 mg | ORAL_TABLET | Freq: Two times a day (BID) | ORAL | 6 refills | Status: DC
Start: 2020-07-29 — End: 2021-04-21

## 2020-07-29 NOTE — Telephone Encounter (Signed)
This is Dr. Croitoru's pt 

## 2020-07-30 ENCOUNTER — Telehealth: Payer: Self-pay | Admitting: Emergency Medicine

## 2020-07-30 NOTE — Telephone Encounter (Signed)
Alert received for elevated Optivol and decreased thoracic impedance. Patient reports she has chest pressure and SOB that increases with any activity. She reports pain between her shoulder blades that has increased over the past 3 days.She reports she has been taking her Demadex 20 mg daily and a prn dose of Demadex 20 mg every day for daily weight > 100 lbs per Dr Croitoru's orders at 07/17/20 visit. Patient advised to go to ED for evaluation due to increasing chest pressure and increasing SOB with activity. Patient agreed to treatment plan.

## 2020-07-30 NOTE — Telephone Encounter (Signed)
Thanks! I alerted the hospital team that she is coming.

## 2020-08-05 ENCOUNTER — Other Ambulatory Visit: Payer: Self-pay

## 2020-08-05 ENCOUNTER — Emergency Department (HOSPITAL_COMMUNITY)
Admission: EM | Admit: 2020-08-05 | Discharge: 2020-08-05 | Disposition: A | Discharge status: Home / Self Care | Payer: Medicare Other | Attending: Emergency Medicine | Admitting: Emergency Medicine

## 2020-08-05 ENCOUNTER — Telehealth: Payer: Self-pay | Admitting: Cardiovascular Disease

## 2020-08-05 ENCOUNTER — Emergency Department (HOSPITAL_COMMUNITY): Payer: Medicare Other

## 2020-08-05 DIAGNOSIS — R609 Edema, unspecified: Secondary | ICD-10-CM | POA: Insufficient documentation

## 2020-08-05 DIAGNOSIS — J441 Chronic obstructive pulmonary disease with (acute) exacerbation: Secondary | ICD-10-CM | POA: Diagnosis not present

## 2020-08-05 DIAGNOSIS — I2510 Atherosclerotic heart disease of native coronary artery without angina pectoris: Secondary | ICD-10-CM | POA: Diagnosis not present

## 2020-08-05 DIAGNOSIS — Z7982 Long term (current) use of aspirin: Secondary | ICD-10-CM | POA: Insufficient documentation

## 2020-08-05 DIAGNOSIS — E119 Type 2 diabetes mellitus without complications: Secondary | ICD-10-CM | POA: Insufficient documentation

## 2020-08-05 DIAGNOSIS — F17200 Nicotine dependence, unspecified, uncomplicated: Secondary | ICD-10-CM | POA: Diagnosis not present

## 2020-08-05 DIAGNOSIS — I5042 Chronic combined systolic (congestive) and diastolic (congestive) heart failure: Secondary | ICD-10-CM | POA: Diagnosis not present

## 2020-08-05 DIAGNOSIS — Z79899 Other long term (current) drug therapy: Secondary | ICD-10-CM | POA: Insufficient documentation

## 2020-08-05 DIAGNOSIS — I110 Hypertensive heart disease with heart failure: Secondary | ICD-10-CM | POA: Diagnosis not present

## 2020-08-05 DIAGNOSIS — R0789 Other chest pain: Secondary | ICD-10-CM | POA: Insufficient documentation

## 2020-08-05 DIAGNOSIS — R079 Chest pain, unspecified: Secondary | ICD-10-CM | POA: Diagnosis present

## 2020-08-05 DIAGNOSIS — R0602 Shortness of breath: Secondary | ICD-10-CM | POA: Insufficient documentation

## 2020-08-05 LAB — CBC
HCT: 37.2 % (ref 36.0–46.0)
Hemoglobin: 11.8 g/dL — ABNORMAL LOW (ref 12.0–15.0)
MCH: 27.1 pg (ref 26.0–34.0)
MCHC: 31.7 g/dL (ref 30.0–36.0)
MCV: 85.3 fL (ref 80.0–100.0)
Platelets: 407 10*3/uL — ABNORMAL HIGH (ref 150–400)
RBC: 4.36 MIL/uL (ref 3.87–5.11)
RDW: 15.5 % (ref 11.5–15.5)
WBC: 9.4 10*3/uL (ref 4.0–10.5)
nRBC: 0 % (ref 0.0–0.2)

## 2020-08-05 LAB — BRAIN NATRIURETIC PEPTIDE: B Natriuretic Peptide: 203.2 pg/mL — ABNORMAL HIGH (ref 0.0–100.0)

## 2020-08-05 LAB — BASIC METABOLIC PANEL
Anion gap: 13 (ref 5–15)
BUN: 23 mg/dL (ref 8–23)
CO2: 32 mmol/L (ref 22–32)
Calcium: 9.7 mg/dL (ref 8.9–10.3)
Chloride: 94 mmol/L — ABNORMAL LOW (ref 98–111)
Creatinine, Ser: 1.33 mg/dL — ABNORMAL HIGH (ref 0.44–1.00)
GFR, Estimated: 42 mL/min — ABNORMAL LOW (ref >60)
Glucose, Bld: 140 mg/dL — ABNORMAL HIGH (ref 70–99)
Potassium: 4.1 mmol/L (ref 3.5–5.1)
Sodium: 139 mmol/L (ref 135–145)

## 2020-08-05 LAB — D-DIMER, QUANTITATIVE: D-Dimer, Quant: 1.16 ug/mL-FEU — ABNORMAL HIGH (ref 0.00–0.50)

## 2020-08-05 LAB — TROPONIN I (HIGH SENSITIVITY)
Troponin I (High Sensitivity): 10 ng/L (ref <18)
Troponin I (High Sensitivity): 13 ng/L (ref <18)

## 2020-08-05 MED ORDER — FENTANYL CITRATE (PF) 100 MCG/2ML IJ SOLN
25.0000 ug | Freq: Once | INTRAMUSCULAR | Status: AC
  Administered 2020-08-05: 25 ug via INTRAVENOUS
  Filled 2020-08-05: qty 2

## 2020-08-05 MED ORDER — LIDOCAINE 5 % EX PTCH: 1.0000 | MEDICATED_PATCH | CUTANEOUS | 0 refills | Status: AC

## 2020-08-05 MED ORDER — IOHEXOL 350 MG/ML SOLN
80.0000 mL | Freq: Once | INTRAVENOUS | Status: AC | PRN
  Administered 2020-08-05: 56 mL via INTRAVENOUS

## 2020-08-05 NOTE — Telephone Encounter (Signed)
Pt c/o of Chest Pain: STAT if CP now or developed within 24 hours  1. Are you having CP right now? Yes   2. Are you experiencing any other symptoms (ex. SOB, nausea, vomiting, sweating)? Legs are weak   3. How long have you been experiencing CP? Since last Wednesday   4. Is your CP continuous or coming and going? Continuous   5. Have you taken Nitroglycerin? No  Kimberly Downs is calling stating she went to the hospital as advised and their first impression was that she was having a mini heart attack. She states they then determined it was not heart related and was just fluid and muscle. They sent her home with a medication increase and Oxygen, but Kimberly Downs states the pain is getting worse and she has never experienced anything like this before. She states its pain/pressure in her chest that goes to her back. Please advise. ?

## 2020-08-05 NOTE — ED Triage Notes (Signed)
Pt here from home for eval of cp x 5-6 days. Hx of CHF. 2 nitro and 324 ASA given PTA with no improvement. Hx CHF.

## 2020-08-05 NOTE — Telephone Encounter (Signed)
I have not seen Kimberly Downs for over 3 years but agree with Dr. Victorino December assessment that based on all the data from [facility] her symptoms do not sound cardiac nor is there any objective evidence to suggest that. I am certainly happy to see her back in follow-up.

## 2020-08-05 NOTE — Telephone Encounter (Signed)
She had an extensive evaluation at [facility]: - ECG cannot be reviewed directly, but the report is identical to our previous tracings. - cardiac enzymes were OK (hsTrop [phone]) - nuclear stress test was normal - CT chest excluded pulmonary embolism and aortic dissection - echo report in careeverywhere is very limited, but normal LV function and no pulmonary HTN (can we get the full report faxed over please?) -did not her CRP was up at 7, suggesting an inflammatory condition  I do not think her symptoms are cardiac in origin, but will defer to Dr. Gwenlyn Found, her primary Cardiologist whether he thinks further clinical evaluation is indicated.

## 2020-08-05 NOTE — Discharge Instructions (Signed)
Continue taking home medications as prescribed. Follow-up with her cardiologist as needed for further evaluation of her symptoms. Use patch as needed for pain control. Return to the emergency room with any new, worsening, or concerning symptoms
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
## 2020-08-05 NOTE — Telephone Encounter (Signed)
Spoke with patient. Patient was in the hospital last week and sent home. She was told her chest pain was related to joint and fluid issues. Patient has had continuing chest pressure and sharp pain that has not gone away. The pain starts as a pressure in the center of her chest that quickly turns sharp and shoots to her back. It radiates to the left and right side of her heart.   She reports she is taking hydrocodone which is not touching the pain. She has not taken nitroglycerin as the hospital told her it wasn't heart related.   The pain is there at rest and worsens with activity. Patient weighs 101.4lbs today, she reports she has been averaging 100-101lbs daily.   Patient is short of breath, O2 start on Wednesday at the hospital.   Advised patient return to ER for further evaluation. Will route to MD.

## 2020-08-11 NOTE — Telephone Encounter (Signed)
Spoke with the patient. She stated that she has been feeling better. An appointment has been made with Dr. Gwenlyn Found in February. She declined a sooner appointment with an APP. She will call back if anything further is needed.

## 2020-09-02 DIAGNOSIS — I10 Essential (primary) hypertension: Secondary | ICD-10-CM | POA: Diagnosis not present

## 2020-09-02 DIAGNOSIS — N183 Chronic kidney disease, stage 3 unspecified: Secondary | ICD-10-CM | POA: Diagnosis not present

## 2020-09-02 DIAGNOSIS — J449 Chronic obstructive pulmonary disease, unspecified: Secondary | ICD-10-CM | POA: Diagnosis not present

## 2020-09-02 DIAGNOSIS — I25110 Atherosclerotic heart disease of native coronary artery with unstable angina pectoris: Secondary | ICD-10-CM | POA: Diagnosis not present

## 2020-09-02 DIAGNOSIS — E119 Type 2 diabetes mellitus without complications: Secondary | ICD-10-CM | POA: Diagnosis not present

## 2020-09-02 DIAGNOSIS — J45998 Other asthma: Secondary | ICD-10-CM | POA: Diagnosis not present

## 2020-09-02 DIAGNOSIS — M5459 Other low back pain: Secondary | ICD-10-CM | POA: Diagnosis not present

## 2020-09-02 DIAGNOSIS — I5040 Unspecified combined systolic (congestive) and diastolic (congestive) heart failure: Secondary | ICD-10-CM | POA: Diagnosis not present

## 2020-09-04 DIAGNOSIS — J449 Chronic obstructive pulmonary disease, unspecified: Secondary | ICD-10-CM | POA: Diagnosis not present

## 2020-09-17 DIAGNOSIS — M5459 Other low back pain: Secondary | ICD-10-CM | POA: Diagnosis not present

## 2020-09-17 DIAGNOSIS — E119 Type 2 diabetes mellitus without complications: Secondary | ICD-10-CM | POA: Diagnosis not present

## 2020-09-17 DIAGNOSIS — J45998 Other asthma: Secondary | ICD-10-CM | POA: Diagnosis not present

## 2020-09-17 DIAGNOSIS — I10 Essential (primary) hypertension: Secondary | ICD-10-CM | POA: Diagnosis not present

## 2020-09-17 DIAGNOSIS — N183 Chronic kidney disease, stage 3 unspecified: Secondary | ICD-10-CM | POA: Diagnosis not present

## 2020-09-17 DIAGNOSIS — I5040 Unspecified combined systolic (congestive) and diastolic (congestive) heart failure: Secondary | ICD-10-CM | POA: Diagnosis not present

## 2020-09-17 DIAGNOSIS — I25110 Atherosclerotic heart disease of native coronary artery with unstable angina pectoris: Secondary | ICD-10-CM | POA: Diagnosis not present

## 2020-09-17 DIAGNOSIS — J449 Chronic obstructive pulmonary disease, unspecified: Secondary | ICD-10-CM | POA: Diagnosis not present

## 2020-09-20 DIAGNOSIS — G8929 Other chronic pain: Secondary | ICD-10-CM | POA: Diagnosis not present

## 2020-09-20 DIAGNOSIS — I10 Essential (primary) hypertension: Secondary | ICD-10-CM | POA: Diagnosis not present

## 2020-09-20 DIAGNOSIS — J449 Chronic obstructive pulmonary disease, unspecified: Secondary | ICD-10-CM | POA: Diagnosis not present

## 2020-09-20 DIAGNOSIS — I509 Heart failure, unspecified: Secondary | ICD-10-CM | POA: Diagnosis not present

## 2020-09-20 DIAGNOSIS — I255 Ischemic cardiomyopathy: Secondary | ICD-10-CM | POA: Diagnosis not present

## 2020-09-20 DIAGNOSIS — E119 Type 2 diabetes mellitus without complications: Secondary | ICD-10-CM | POA: Diagnosis not present

## 2020-09-20 DIAGNOSIS — I480 Paroxysmal atrial fibrillation: Secondary | ICD-10-CM | POA: Diagnosis not present

## 2020-09-20 DIAGNOSIS — I119 Hypertensive heart disease without heart failure: Secondary | ICD-10-CM | POA: Diagnosis not present

## 2020-09-20 DIAGNOSIS — E1140 Type 2 diabetes mellitus with diabetic neuropathy, unspecified: Secondary | ICD-10-CM | POA: Diagnosis not present

## 2020-09-20 DIAGNOSIS — I2510 Atherosclerotic heart disease of native coronary artery without angina pectoris: Secondary | ICD-10-CM | POA: Diagnosis not present

## 2020-09-22 DIAGNOSIS — I10 Essential (primary) hypertension: Secondary | ICD-10-CM | POA: Diagnosis not present

## 2020-09-22 DIAGNOSIS — N183 Chronic kidney disease, stage 3 unspecified: Secondary | ICD-10-CM | POA: Diagnosis not present

## 2020-09-22 DIAGNOSIS — M5459 Other low back pain: Secondary | ICD-10-CM | POA: Diagnosis not present

## 2020-09-22 DIAGNOSIS — J449 Chronic obstructive pulmonary disease, unspecified: Secondary | ICD-10-CM | POA: Diagnosis not present

## 2020-09-22 DIAGNOSIS — I5040 Unspecified combined systolic (congestive) and diastolic (congestive) heart failure: Secondary | ICD-10-CM | POA: Diagnosis not present

## 2020-09-22 DIAGNOSIS — I25110 Atherosclerotic heart disease of native coronary artery with unstable angina pectoris: Secondary | ICD-10-CM | POA: Diagnosis not present

## 2020-09-22 DIAGNOSIS — J45998 Other asthma: Secondary | ICD-10-CM | POA: Diagnosis not present

## 2020-09-22 DIAGNOSIS — E119 Type 2 diabetes mellitus without complications: Secondary | ICD-10-CM | POA: Diagnosis not present

## 2020-09-24 DIAGNOSIS — S22030A Wedge compression fracture of third thoracic vertebra, initial encounter for closed fracture: Secondary | ICD-10-CM | POA: Diagnosis not present

## 2020-09-24 DIAGNOSIS — M546 Pain in thoracic spine: Secondary | ICD-10-CM | POA: Diagnosis not present

## 2020-09-24 DIAGNOSIS — S22000A Wedge compression fracture of unspecified thoracic vertebra, initial encounter for closed fracture: Secondary | ICD-10-CM | POA: Diagnosis not present

## 2020-09-25 ENCOUNTER — Other Ambulatory Visit: Payer: Self-pay | Admitting: Rehabilitation

## 2020-09-25 ENCOUNTER — Other Ambulatory Visit: Payer: Medicare Other

## 2020-09-25 DIAGNOSIS — S22030A Wedge compression fracture of third thoracic vertebra, initial encounter for closed fracture: Secondary | ICD-10-CM

## 2020-09-26 ENCOUNTER — Ambulatory Visit
Admission: RE | Admit: 2020-09-26 | Discharge: 2020-09-26 | Disposition: A | Discharge status: Home / Self Care | Payer: Medicare Other | Source: Ambulatory Visit | Attending: Rehabilitation | Admitting: Rehabilitation

## 2020-09-26 DIAGNOSIS — S22070A Wedge compression fracture of T9-T10 vertebra, initial encounter for closed fracture: Secondary | ICD-10-CM | POA: Diagnosis not present

## 2020-09-26 DIAGNOSIS — S22010A Wedge compression fracture of first thoracic vertebra, initial encounter for closed fracture: Secondary | ICD-10-CM | POA: Diagnosis not present

## 2020-09-26 DIAGNOSIS — S22030A Wedge compression fracture of third thoracic vertebra, initial encounter for closed fracture: Secondary | ICD-10-CM

## 2020-09-26 DIAGNOSIS — S2241XD Multiple fractures of ribs, right side, subsequent encounter for fracture with routine healing: Secondary | ICD-10-CM | POA: Diagnosis not present

## 2020-10-01 ENCOUNTER — Encounter: Payer: Self-pay | Admitting: Neurology

## 2020-10-01 ENCOUNTER — Ambulatory Visit: Payer: Medicare Other | Admitting: Neurology

## 2020-10-01 ENCOUNTER — Other Ambulatory Visit: Payer: Self-pay

## 2020-10-01 VITALS — BP 161/74 | HR 89 | Resp 18 | Ht 59.0 in | Wt 100.0 lb

## 2020-10-01 DIAGNOSIS — R42 Dizziness and giddiness: Secondary | ICD-10-CM

## 2020-10-01 DIAGNOSIS — G629 Polyneuropathy, unspecified: Secondary | ICD-10-CM | POA: Diagnosis not present

## 2020-10-01 NOTE — Patient Instructions (Addendum)
1. Schedule head CT without contrast  2. Schedule EEG  3. Continue using walker for balance  4. Follow-up in 6-8 months, call for any changes  We have sent a referral to [facility] for your CT and they will call you directly to schedule your appointment. They are located at [place]. If you need to contact them directly please call [phone].
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
## 2020-10-02 ENCOUNTER — Ambulatory Visit: Payer: Medicare Other | Admitting: Cardiology

## 2020-10-03 DIAGNOSIS — M8008XA Age-related osteoporosis with current pathological fracture, vertebra(e), initial encounter for fracture: Secondary | ICD-10-CM | POA: Diagnosis not present

## 2020-10-05 DIAGNOSIS — J449 Chronic obstructive pulmonary disease, unspecified: Secondary | ICD-10-CM | POA: Diagnosis not present

## 2020-10-07 ENCOUNTER — Other Ambulatory Visit: Payer: Self-pay

## 2020-10-07 ENCOUNTER — Ambulatory Visit: Payer: Medicare Other | Admitting: Cardiovascular Disease

## 2020-10-07 ENCOUNTER — Encounter: Payer: Self-pay | Admitting: Cardiovascular Disease

## 2020-10-07 VITALS — BP 141/69 | HR 81 | Ht 59.0 in | Wt 101.8 lb

## 2020-10-07 DIAGNOSIS — E7800 Pure hypercholesterolemia, unspecified: Secondary | ICD-10-CM

## 2020-10-07 DIAGNOSIS — I5042 Chronic combined systolic (congestive) and diastolic (congestive) heart failure: Secondary | ICD-10-CM | POA: Diagnosis not present

## 2020-10-07 DIAGNOSIS — I25119 Atherosclerotic heart disease of native coronary artery with unspecified angina pectoris: Secondary | ICD-10-CM | POA: Diagnosis not present

## 2020-10-07 DIAGNOSIS — I480 Paroxysmal atrial fibrillation: Secondary | ICD-10-CM | POA: Diagnosis not present

## 2020-10-07 DIAGNOSIS — I10 Essential (primary) hypertension: Secondary | ICD-10-CM

## 2020-10-07 DIAGNOSIS — Z720 Tobacco use: Secondary | ICD-10-CM | POA: Diagnosis not present

## 2020-10-07 DIAGNOSIS — I4891 Unspecified atrial fibrillation: Secondary | ICD-10-CM

## 2020-10-07 NOTE — Assessment & Plan Note (Signed)
History of ongoing tobacco abuse of several cigarettes a day recalcitrant to risk factor modification.  She does have COPD on oxygen 24/7.

## 2020-10-07 NOTE — Assessment & Plan Note (Signed)
History of essential hypertension blood pressure measured today 141/69 and she is on carvedilol and losartan.

## 2020-10-07 NOTE — Patient Instructions (Signed)

## 2020-10-07 NOTE — Assessment & Plan Note (Signed)
History of hyperlipidemia on statin therapy with lipid profile performed 10/11/2018 revealing total cholesterol 170, LDL of 75 and HDL of 73.

## 2020-10-07 NOTE — Progress Notes (Signed)
10/07/2020 Kimberly Downs   06/25/1946  [id number]  Primary Physician Masneri, Adele Barthel, DO Primary Cardiologist: Lorretta Harp MD FACP, [place], [place], [place]  HPI:  Kimberly Downs is a 75 y.o.  mildly overweight weight recently widowed Caucasian female mother of one son who is here in [place] who I last saw in the office 05/31/2017. She has a history of coronary stenting in [place] approximately 7 years ago apparently did not have a myocardial infarction. Her cardiac risk factors include continued tobacco abuse. Strong family history, diabetes, hypertension and hyperlipidemia. She gets occasional chest pain. She also complains of claudication. She says several months of postprandial abdominal pain with food avoidance and 30 pound weight loss. She's had multiple colonoscopies was told she's had C. Difficile on several occasions. Recent colonoscopy performed by Dr. Benson Norway did not show any mucosal changes of ischemic colitis. A abdominal CT however did show high-grade disease in the celiac axis, superior and inferior mesenteric artery suggesting an ischemic etiology.I performed abdominal aortography on 06/02/15 showing an most mild to moderate superior mesenteric artery stenosis and celiac stenosis. She did have a Myoview stress test performed at the time of that hospitalization which was nonischemic. She denies chest pain or shortness of breath. She does continue to smoke a half a pack per day and has a 50-75-pack-year history of tobacco abuse.  She was admitted to the hospital 03/15/17 for a week with shortness of breath and heart failure. I performed cardiac catheterization on her on 03/21/17 revealed a widely patent proximal RCA stent with a LVEF of 25%.   I referred her to Dr. Sallyanne Kuster for consideration of ICD implantation which was performed 07/27/2017.  She has not had discharge.  She is currently on oxygen 24/7 and complains of shortness of breath probably from her COPD as well as her  chronic systolic and diastolic heart failure.   Current Meds  Medication Sig  . aspirin EC 81 MG tablet Take 81 mg by mouth daily.  . baclofen (LIORESAL) 10 MG tablet Take 10 mg by mouth 2 (two) times daily as needed.  . Calcium Carb-Cholecalciferol (CALCIUM PLUS D3 ABSORBABLE PO) Take by mouth.  . carvedilol (COREG) 6.25 MG tablet Take 1 tablet (6.25 mg total) by mouth 2 (two) times daily with a meal.  . cholecalciferol (VITAMIN D) 25 MCG (1000 UT) tablet Take 1,000 Units by mouth daily.   . clopidogrel (PLAVIX) 75 MG tablet Take by mouth.  . Ipratropium-Albuterol (COMBIVENT) 20-100 MCG/ACT AERS respimat Inhale 1 puff into the lungs every 6 (six) hours.  . lidocaine (LIDODERM) 5 % Place 1 patch onto the skin daily. Remove & Discard patch within 12 hours or as directed by MD  . losartan (COZAAR) 25 MG tablet Take 1 tablet (25 mg total) by mouth 2 (two) times daily.  . meclizine (ANTIVERT) 12.5 MG tablet Take 1 tablet (12.5 mg total) by mouth 3 (three) times daily as needed for dizziness.  . mometasone-formoterol (DULERA) 200-5 MCG/ACT AERO Inhale 2 puffs into the lungs 2 (two) times daily.  . nitroGLYCERIN (NITROSTAT) 0.4 MG SL tablet PLACE 1 TABLET UNDER THE TONGUE EVERY 5 MINUTES AS NEEDED FOR CHEST PAIN.  [name] ondansetron (ZOFRAN) 4 MG tablet Take 4 mg by mouth every 8 (eight) hours as needed.   . predniSONE (DELTASONE) 10 MG tablet Take 10 mg by mouth daily with breakfast.  . rOPINIRole (REQUIP) 2 MG tablet   . Tiotropium Bromide Monohydrate (SPIRIVA RESPIMAT) 2.5  MCG/ACT AERS Inhale 2 puffs into the lungs daily.  [name] torsemide (DEMADEX) 20 MG tablet Take 1 tablet (20 mg total) by mouth daily. Take an extra 20 mg for a weight over 100 pounds.  . traMADol (ULTRAM) 50 MG tablet Take 50 mg by mouth every 8 (eight) hours.     Allergies  Allergen Reactions  . Gabapentin Other (See Comments)    Passed out.  Kimberly Downs [Pregabalin] Other (See Comments)    "almost died" confusion, syncope,  hypertension  . Dicyclomine Other (See Comments)    Reaction:  Agitation  Other reaction(s): Agitation  . Ibuprofen Nausea And Vomiting  . Morphine And Related Nausea And Vomiting  . Nsaids Nausea And Vomiting  . Toradol [Ketorolac Tromethamine] Nausea And Vomiting  . Levaquin [Levofloxacin] Nausea And Vomiting and Rash  . Metronidazole Nausea And Vomiting and Rash    Social History   Socioeconomic History  . Marital status: Widowed    Spouse name: Not on file  . Number of children: 1  . Years of education: 72  . Highest education level: Not on file  Occupational History  . Occupation: Retired - [employer]  Tobacco Use  . Smoking status: Current Every Day Smoker    Packs/day: 1.50    Years: 58.00    Pack years: 87.00    Start date: 11/08/1960  . Smokeless tobacco: Never Used  . Tobacco comment: 3-4 cigarettes - 05/26/18-- 4 per 11/09/18  Vaping Use  . Vaping Use: Never used  Substance and Sexual Activity  . Alcohol use: Never    Comment: rare use  . Drug use: No  . Sexual activity: Never    Birth control/protection: Diaphragm  Other Topics Concern  . Not on file  Social History Narrative   Lives alone at an apt at [facility]   Caffeine use: 2 cups coffee/day    Right handed   Has 1 adult child   High school graduate   Lives in a one story home   Social Determinants of Health   Financial Resource Strain: Not on file  Food Insecurity: Not on file  Transportation Needs: Not on file  Physical Activity: Not on file  Stress: Not on file  Social Connections: Not on file  Intimate Partner Violence: Not on file     Review of Systems: General: negative for chills, fever, night sweats or weight changes.  Cardiovascular: negative for chest pain, dyspnea on exertion, edema, orthopnea, palpitations, paroxysmal nocturnal dyspnea or shortness of breath Dermatological: negative for rash Respiratory: negative for cough or wheezing Urologic: negative for  hematuria Abdominal: negative for nausea, vomiting, diarrhea, bright red blood per rectum, melena, or hematemesis Neurologic: negative for visual changes, syncope, or dizziness All other systems reviewed and are otherwise negative except as noted above.    Blood pressure (!) 141/69, pulse 81, height 4\' 11"  (1.499 m), weight 101 lb 12.8 oz (46.2 kg), SpO2 100 %.  General appearance: alert and no distress Neck: no adenopathy, no carotid bruit, no JVD, supple, symmetrical, trachea midline and thyroid not enlarged, symmetric, no tenderness/mass/nodules Lungs: clear to auscultation bilaterally Heart: regular rate and rhythm, S1, S2 normal, no murmur, click, rub or gallop Extremities: extremities normal, atraumatic, no cyanosis or edema Pulses: 2+ and symmetric Skin: Skin color, texture, turgor normal. No rashes or lesions Neurologic: Alert and oriented X 3, normal strength and tone. Normal symmetric reflexes. Normal coordination and gait  EKG sinus rhythm at 81 with septal Q waves and nonspecific  ST and T wave changes.  I personally reviewed this EKG.  ASSESSMENT AND PLAN:   Essential hypertension History of essential hypertension blood pressure measured today 141/69 and she is on carvedilol and losartan.  Coronary artery disease involving native coronary artery of native heart with angina pectoris ([place]) History of CAD status post coronary artery stenting in [place] remotely.  I performed cardiac catheterization on her 03/21/2017 revealing widely patent proximal RCA stent with an EF of 25%.  She denies chest pain.  Hypercholesterolemia History of hyperlipidemia on statin therapy with lipid profile performed 10/11/2018 revealing total cholesterol 170, LDL of 75 and HDL of 73.  Tobacco abuse History of ongoing tobacco abuse of several cigarettes a day recalcitrant to risk factor modification.  She does have COPD on oxygen 24/7.  Atrial fibrillation with RVR (HCC) History of PAF seen  on ICD interrogation not on oral anticoagulation.  Chronic combined systolic and diastolic heart failure (HCC) History of combined systolic and diastolic heart failure with an EF of 25 to 30% performed 06/16/2017.  She is on carvedilol and losartan.  She had an ICD placed for primary prevention by Dr. Sallyanne Kuster at my request 07/27/2017.  She has not had a discharge and has at least 10 years of battery life left.      Lorretta Harp MD FACP,FACC,FAHA, [facility] 10/07/2020 3:44 PM

## 2020-10-07 NOTE — Assessment & Plan Note (Signed)
History of combined systolic and diastolic heart failure with an EF of 25 to 30% performed 06/16/2017.  She is on carvedilol and losartan.  She had an ICD placed for primary prevention by Dr. Sallyanne Kuster at my request 07/27/2017.  She has not had a discharge and has at least 10 years of battery life left.

## 2020-10-07 NOTE — Assessment & Plan Note (Signed)
History of PAF seen on ICD interrogation not on oral anticoagulation.

## 2020-10-07 NOTE — Assessment & Plan Note (Signed)
History of CAD status post coronary artery stenting in [place] remotely.  I performed cardiac catheterization on her 03/21/2017 revealing widely patent proximal RCA stent with an EF of 25%.  She denies chest pain.

## 2020-10-08 ENCOUNTER — Other Ambulatory Visit: Payer: Self-pay

## 2020-10-08 DIAGNOSIS — F0781 Postconcussional syndrome: Secondary | ICD-10-CM

## 2020-10-08 DIAGNOSIS — R42 Dizziness and giddiness: Secondary | ICD-10-CM

## 2020-10-15 ENCOUNTER — Other Ambulatory Visit: Payer: Self-pay

## 2020-10-15 ENCOUNTER — Ambulatory Visit: Payer: Medicare Other | Admitting: Neurology

## 2020-10-15 ENCOUNTER — Other Ambulatory Visit: Payer: Self-pay | Admitting: Neurology

## 2020-10-15 DIAGNOSIS — R42 Dizziness and giddiness: Secondary | ICD-10-CM

## 2020-10-15 DIAGNOSIS — G629 Polyneuropathy, unspecified: Secondary | ICD-10-CM

## 2020-10-15 DIAGNOSIS — F0781 Postconcussional syndrome: Secondary | ICD-10-CM

## 2020-10-21 DIAGNOSIS — I2510 Atherosclerotic heart disease of native coronary artery without angina pectoris: Secondary | ICD-10-CM | POA: Diagnosis not present

## 2020-10-21 DIAGNOSIS — I255 Ischemic cardiomyopathy: Secondary | ICD-10-CM | POA: Diagnosis not present

## 2020-10-21 DIAGNOSIS — I480 Paroxysmal atrial fibrillation: Secondary | ICD-10-CM | POA: Diagnosis not present

## 2020-10-21 DIAGNOSIS — G8929 Other chronic pain: Secondary | ICD-10-CM | POA: Diagnosis not present

## 2020-10-21 DIAGNOSIS — I509 Heart failure, unspecified: Secondary | ICD-10-CM | POA: Diagnosis not present

## 2020-10-21 DIAGNOSIS — J449 Chronic obstructive pulmonary disease, unspecified: Secondary | ICD-10-CM | POA: Diagnosis not present

## 2020-10-21 DIAGNOSIS — E1140 Type 2 diabetes mellitus with diabetic neuropathy, unspecified: Secondary | ICD-10-CM | POA: Diagnosis not present

## 2020-10-21 DIAGNOSIS — I5042 Chronic combined systolic (congestive) and diastolic (congestive) heart failure: Secondary | ICD-10-CM | POA: Diagnosis not present

## 2020-10-21 DIAGNOSIS — M810 Age-related osteoporosis without current pathological fracture: Secondary | ICD-10-CM | POA: Diagnosis not present

## 2020-10-21 DIAGNOSIS — I10 Essential (primary) hypertension: Secondary | ICD-10-CM | POA: Diagnosis not present

## 2020-10-21 DIAGNOSIS — I119 Hypertensive heart disease without heart failure: Secondary | ICD-10-CM | POA: Diagnosis not present

## 2020-10-21 DIAGNOSIS — E119 Type 2 diabetes mellitus without complications: Secondary | ICD-10-CM | POA: Diagnosis not present

## 2020-10-22 NOTE — Procedures (Signed)
ELECTROENCEPHALOGRAM REPORT  Date of Study: 10/15/2020  Patient's Name: Kimberly Downs MRN: [id number] Date of Birth: 08/13/1946  Referring Provider: Dr. Ellouise Newer  Clinical History: This is a 75 year old woman with a history of seizures, dizziness.   Medications: Aspirin Baclofen Carvedilol Clopidogrel Cozaar Meclizine Requip Torsemid   Technical Summary: A multichannel digital EEG recording measured by the international 10-20 system with electrodes applied with paste and impedances below 5000 ohms performed in our laboratory with EKG monitoring in an awake and asleep patient.  Hyperventilation was not performed. Photic stimulation was performed.  The digital EEG was referentially recorded, reformatted, and digitally filtered in a variety of bipolar and referential montages for optimal display.    Description: The patient is awake and asleep during the recording.  During maximal wakefulness, there is a symmetric, medium voltage 8.5 Hz posterior dominant rhythm that attenuates with eye opening.  The record is symmetric.  During drowsiness and sleep, there is an increase in theta and delta slowing of the background, with shifting asymmetry over the bilateral temporal regions.  Vertex waves and symmetric sleep spindles were seen.  Photic stimulation did not elicit any abnormalities.  There were no epileptiform discharges or electrographic seizures seen.    EKG lead was unremarkable.  Impression: This awake and asleep EEG is within normal limits.  Clinical Correlation: A normal EEG does not exclude a clinical diagnosis of epilepsy.  If further clinical questions remain, prolonged EEG may be helpful.  Clinical correlation is advised.   Ellouise Newer, M.D.

## 2020-10-24 DIAGNOSIS — Z6820 Body mass index (BMI) 20.0-20.9, adult: Secondary | ICD-10-CM | POA: Diagnosis not present

## 2020-10-24 DIAGNOSIS — I10 Essential (primary) hypertension: Secondary | ICD-10-CM | POA: Diagnosis not present

## 2020-10-24 DIAGNOSIS — M8008XA Age-related osteoporosis with current pathological fracture, vertebra(e), initial encounter for fracture: Secondary | ICD-10-CM | POA: Diagnosis not present

## 2020-10-27 ENCOUNTER — Other Ambulatory Visit: Payer: Self-pay | Admitting: Orthopedic Surgery

## 2020-10-27 DIAGNOSIS — M8008XA Age-related osteoporosis with current pathological fracture, vertebra(e), initial encounter for fracture: Secondary | ICD-10-CM

## 2020-10-30 ENCOUNTER — Other Ambulatory Visit: Payer: Self-pay

## 2020-10-30 ENCOUNTER — Ambulatory Visit
Admission: RE | Admit: 2020-10-30 | Discharge: 2020-10-30 | Disposition: A | Discharge status: Home / Self Care | Payer: Medicare Other | Source: Ambulatory Visit | Attending: Orthopedic Surgery | Admitting: Orthopedic Surgery

## 2020-10-30 ENCOUNTER — Ambulatory Visit
Admission: RE | Admit: 2020-10-30 | Discharge: 2020-10-30 | Disposition: A | Discharge status: Home / Self Care | Payer: Medicare Other | Source: Ambulatory Visit | Attending: Neurology | Admitting: Neurology

## 2020-10-30 DIAGNOSIS — M40204 Unspecified kyphosis, thoracic region: Secondary | ICD-10-CM | POA: Diagnosis not present

## 2020-10-30 DIAGNOSIS — F0781 Postconcussional syndrome: Secondary | ICD-10-CM

## 2020-10-30 DIAGNOSIS — S22030A Wedge compression fracture of third thoracic vertebra, initial encounter for closed fracture: Secondary | ICD-10-CM | POA: Diagnosis not present

## 2020-10-30 DIAGNOSIS — R42 Dizziness and giddiness: Secondary | ICD-10-CM

## 2020-10-30 DIAGNOSIS — S22070A Wedge compression fracture of T9-T10 vertebra, initial encounter for closed fracture: Secondary | ICD-10-CM | POA: Diagnosis not present

## 2020-10-30 DIAGNOSIS — M5134 Other intervertebral disc degeneration, thoracic region: Secondary | ICD-10-CM | POA: Diagnosis not present

## 2020-10-30 DIAGNOSIS — S0990XA Unspecified injury of head, initial encounter: Secondary | ICD-10-CM | POA: Diagnosis not present

## 2020-10-30 DIAGNOSIS — M8008XA Age-related osteoporosis with current pathological fracture, vertebra(e), initial encounter for fracture: Secondary | ICD-10-CM

## 2020-10-31 ENCOUNTER — Telehealth: Payer: Self-pay

## 2020-10-31 NOTE — Telephone Encounter (Signed)
-----   Message from Cameron Sprang, MD sent at 10/31/2020 12:13 PM EST ----- Pls let her know the head CT did not show any evidence of tumor, stroke, or bleed. It showed age-related changes. The EEG was also normal. How is she feeling? Thanks

## 2020-10-31 NOTE — Telephone Encounter (Signed)
spokw with pt informed her that head CT did not show any evidence of tumor, stroke, or bleed. It showed age-related changes. The EEG was also normal. How is she feeling? Has a broken rib unsure how she keeps breaking them about to start meds to strengthen bones, neuro standpoint she stated doing great

## 2020-11-02 DIAGNOSIS — J449 Chronic obstructive pulmonary disease, unspecified: Secondary | ICD-10-CM | POA: Diagnosis not present

## 2020-11-06 DIAGNOSIS — M8008XA Age-related osteoporosis with current pathological fracture, vertebra(e), initial encounter for fracture: Secondary | ICD-10-CM | POA: Diagnosis not present

## 2020-11-18 DIAGNOSIS — T07XXXD Unspecified multiple injuries, subsequent encounter: Secondary | ICD-10-CM | POA: Diagnosis not present

## 2020-11-18 DIAGNOSIS — I10 Essential (primary) hypertension: Secondary | ICD-10-CM | POA: Diagnosis not present

## 2020-11-18 DIAGNOSIS — I739 Peripheral vascular disease, unspecified: Secondary | ICD-10-CM | POA: Diagnosis not present

## 2020-11-18 DIAGNOSIS — I509 Heart failure, unspecified: Secondary | ICD-10-CM | POA: Diagnosis not present

## 2020-11-18 DIAGNOSIS — K219 Gastro-esophageal reflux disease without esophagitis: Secondary | ICD-10-CM | POA: Diagnosis not present

## 2020-11-18 DIAGNOSIS — I2510 Atherosclerotic heart disease of native coronary artery without angina pectoris: Secondary | ICD-10-CM | POA: Diagnosis not present

## 2020-11-18 DIAGNOSIS — I480 Paroxysmal atrial fibrillation: Secondary | ICD-10-CM | POA: Diagnosis not present

## 2020-11-18 DIAGNOSIS — G894 Chronic pain syndrome: Secondary | ICD-10-CM | POA: Diagnosis not present

## 2020-11-18 DIAGNOSIS — I255 Ischemic cardiomyopathy: Secondary | ICD-10-CM | POA: Diagnosis not present

## 2020-11-18 DIAGNOSIS — E1140 Type 2 diabetes mellitus with diabetic neuropathy, unspecified: Secondary | ICD-10-CM | POA: Diagnosis not present

## 2020-11-18 DIAGNOSIS — F17210 Nicotine dependence, cigarettes, uncomplicated: Secondary | ICD-10-CM | POA: Diagnosis not present

## 2020-11-18 DIAGNOSIS — J441 Chronic obstructive pulmonary disease with (acute) exacerbation: Secondary | ICD-10-CM | POA: Diagnosis not present

## 2020-11-26 DIAGNOSIS — G8929 Other chronic pain: Secondary | ICD-10-CM | POA: Diagnosis not present

## 2020-11-26 DIAGNOSIS — E119 Type 2 diabetes mellitus without complications: Secondary | ICD-10-CM | POA: Diagnosis not present

## 2020-11-26 DIAGNOSIS — J449 Chronic obstructive pulmonary disease, unspecified: Secondary | ICD-10-CM | POA: Diagnosis not present

## 2020-11-26 DIAGNOSIS — M810 Age-related osteoporosis without current pathological fracture: Secondary | ICD-10-CM | POA: Diagnosis not present

## 2020-11-26 DIAGNOSIS — I5042 Chronic combined systolic (congestive) and diastolic (congestive) heart failure: Secondary | ICD-10-CM | POA: Diagnosis not present

## 2020-11-26 DIAGNOSIS — I2510 Atherosclerotic heart disease of native coronary artery without angina pectoris: Secondary | ICD-10-CM | POA: Diagnosis not present

## 2020-11-26 DIAGNOSIS — J441 Chronic obstructive pulmonary disease with (acute) exacerbation: Secondary | ICD-10-CM | POA: Diagnosis not present

## 2020-11-26 DIAGNOSIS — I509 Heart failure, unspecified: Secondary | ICD-10-CM | POA: Diagnosis not present

## 2020-11-26 DIAGNOSIS — E1140 Type 2 diabetes mellitus with diabetic neuropathy, unspecified: Secondary | ICD-10-CM | POA: Diagnosis not present

## 2020-11-26 DIAGNOSIS — I480 Paroxysmal atrial fibrillation: Secondary | ICD-10-CM | POA: Diagnosis not present

## 2020-11-26 DIAGNOSIS — I10 Essential (primary) hypertension: Secondary | ICD-10-CM | POA: Diagnosis not present

## 2020-11-26 DIAGNOSIS — K219 Gastro-esophageal reflux disease without esophagitis: Secondary | ICD-10-CM | POA: Diagnosis not present

## 2020-11-28 ENCOUNTER — Ambulatory Visit (INDEPENDENT_AMBULATORY_CARE_PROVIDER_SITE_OTHER): Payer: Medicare Other

## 2020-11-28 DIAGNOSIS — I469 Cardiac arrest, cause unspecified: Secondary | ICD-10-CM | POA: Diagnosis not present

## 2020-12-02 ENCOUNTER — Telehealth: Payer: Self-pay | Admitting: Pulmonary Disease

## 2020-12-02 LAB — CUP PACEART REMOTE DEVICE CHECK
Battery Remaining Longevity: 114 mo
Battery Voltage: 2.95 V
Brady Statistic RV Percent Paced: 0.01 %
Date Time Interrogation Session: 20220401105126
HighPow Impedance: 76 Ohm
Implantable Lead Implant Date: 20181128
Implantable Lead Location: 753860
Implantable Pulse Generator Implant Date: 20181128
Lead Channel Impedance Value: 266 Ohm
Lead Channel Impedance Value: 323 Ohm
Lead Channel Pacing Threshold Amplitude: 1.75 V
Lead Channel Pacing Threshold Pulse Width: 0.4 ms
Lead Channel Sensing Intrinsic Amplitude: 12 mV
Lead Channel Sensing Intrinsic Amplitude: 12 mV
Lead Channel Setting Pacing Amplitude: 4 V
Lead Channel Setting Pacing Pulse Width: 0.4 ms
Lead Channel Setting Sensing Sensitivity: 0.3 mV

## 2020-12-02 NOTE — Telephone Encounter (Signed)
Called and spoke with patient about recs from [place]. She expressed understanding. States that she will have to call the office back about getting established with new MD as she doesn't get around that well anymore. When patient calles back please schedule her for a new patient appointment with any provider. Nothing further needed at this time.

## 2020-12-02 NOTE — Telephone Encounter (Signed)
Called and spoke with the pt  She is c/o increased cough for the past 6-7 wks, worse for the past wk  She states that she is coughing up clear sputum  She also states she has been "vomiting clear, foamy liquid" x 6 wks  She states that she is wheezing  Her SOB has not been any worse than usual  No f/c/s, aches  She states she is still on her dulera, spiriva and combivent  Has not been tested for covid and has only had vax x 2- last in May 2021  I advised that she needs appt for eval- last seen here in 2020  She states that she is unable to come in for appt due to no transportation and the fact that she is wheelchair bound and is on supplemental o2  I assured her we could accommodate her needs and also give info for transportation but she declined  Forwarding to provider of the day since this pt was est with Dr Carlis Abbott and last seen by [employer]

## 2020-12-02 NOTE — Telephone Encounter (Signed)
She should get establish with a new [facility] pulmonary MD. Would recommend she take mucinex 600mg  twice a day, use combivent every 6 hours and try an over the counter antihistamine such as Claritin or Zyrtec. Not much else to do over the phone.

## 2020-12-03 DIAGNOSIS — I25119 Atherosclerotic heart disease of native coronary artery with unspecified angina pectoris: Secondary | ICD-10-CM | POA: Diagnosis not present

## 2020-12-03 DIAGNOSIS — I10 Essential (primary) hypertension: Secondary | ICD-10-CM | POA: Diagnosis not present

## 2020-12-03 DIAGNOSIS — Z886 Allergy status to analgesic agent status: Secondary | ICD-10-CM | POA: Diagnosis not present

## 2020-12-03 DIAGNOSIS — R6889 Other general symptoms and signs: Secondary | ICD-10-CM | POA: Diagnosis not present

## 2020-12-03 DIAGNOSIS — R0789 Other chest pain: Secondary | ICD-10-CM | POA: Diagnosis not present

## 2020-12-03 DIAGNOSIS — I130 Hypertensive heart and chronic kidney disease with heart failure and stage 1 through stage 4 chronic kidney disease, or unspecified chronic kidney disease: Secondary | ICD-10-CM | POA: Diagnosis not present

## 2020-12-03 DIAGNOSIS — I428 Other cardiomyopathies: Secondary | ICD-10-CM | POA: Diagnosis not present

## 2020-12-03 DIAGNOSIS — F17210 Nicotine dependence, cigarettes, uncomplicated: Secondary | ICD-10-CM | POA: Diagnosis not present

## 2020-12-03 DIAGNOSIS — I499 Cardiac arrhythmia, unspecified: Secondary | ICD-10-CM | POA: Diagnosis not present

## 2020-12-03 DIAGNOSIS — R911 Solitary pulmonary nodule: Secondary | ICD-10-CM | POA: Diagnosis not present

## 2020-12-03 DIAGNOSIS — S2232XA Fracture of one rib, left side, initial encounter for closed fracture: Secondary | ICD-10-CM | POA: Diagnosis not present

## 2020-12-03 DIAGNOSIS — S299XXA Unspecified injury of thorax, initial encounter: Secondary | ICD-10-CM | POA: Diagnosis not present

## 2020-12-03 DIAGNOSIS — E1165 Type 2 diabetes mellitus with hyperglycemia: Secondary | ICD-10-CM | POA: Diagnosis not present

## 2020-12-03 DIAGNOSIS — E1122 Type 2 diabetes mellitus with diabetic chronic kidney disease: Secondary | ICD-10-CM | POA: Diagnosis not present

## 2020-12-03 DIAGNOSIS — S22070A Wedge compression fracture of T9-T10 vertebra, initial encounter for closed fracture: Secondary | ICD-10-CM | POA: Diagnosis not present

## 2020-12-03 DIAGNOSIS — R079 Chest pain, unspecified: Secondary | ICD-10-CM | POA: Diagnosis not present

## 2020-12-03 DIAGNOSIS — R0781 Pleurodynia: Secondary | ICD-10-CM | POA: Diagnosis not present

## 2020-12-03 DIAGNOSIS — I5042 Chronic combined systolic (congestive) and diastolic (congestive) heart failure: Secondary | ICD-10-CM | POA: Diagnosis not present

## 2020-12-03 DIAGNOSIS — E785 Hyperlipidemia, unspecified: Secondary | ICD-10-CM | POA: Diagnosis not present

## 2020-12-03 DIAGNOSIS — Z950 Presence of cardiac pacemaker: Secondary | ICD-10-CM | POA: Diagnosis not present

## 2020-12-03 DIAGNOSIS — R569 Unspecified convulsions: Secondary | ICD-10-CM | POA: Diagnosis not present

## 2020-12-03 DIAGNOSIS — S2242XA Multiple fractures of ribs, left side, initial encounter for closed fracture: Secondary | ICD-10-CM | POA: Diagnosis not present

## 2020-12-03 DIAGNOSIS — G252 Other specified forms of tremor: Secondary | ICD-10-CM | POA: Diagnosis not present

## 2020-12-03 DIAGNOSIS — R1312 Dysphagia, oropharyngeal phase: Secondary | ICD-10-CM | POA: Diagnosis not present

## 2020-12-03 DIAGNOSIS — R52 Pain, unspecified: Secondary | ICD-10-CM | POA: Diagnosis not present

## 2020-12-03 DIAGNOSIS — D631 Anemia in chronic kidney disease: Secondary | ICD-10-CM | POA: Diagnosis not present

## 2020-12-03 DIAGNOSIS — Z7902 Long term (current) use of antithrombotics/antiplatelets: Secondary | ICD-10-CM | POA: Diagnosis not present

## 2020-12-03 DIAGNOSIS — Z87891 Personal history of nicotine dependence: Secondary | ICD-10-CM | POA: Diagnosis not present

## 2020-12-03 DIAGNOSIS — I480 Paroxysmal atrial fibrillation: Secondary | ICD-10-CM | POA: Diagnosis not present

## 2020-12-03 DIAGNOSIS — K2950 Unspecified chronic gastritis without bleeding: Secondary | ICD-10-CM | POA: Diagnosis not present

## 2020-12-03 DIAGNOSIS — M353 Polymyalgia rheumatica: Secondary | ICD-10-CM | POA: Diagnosis not present

## 2020-12-03 DIAGNOSIS — I509 Heart failure, unspecified: Secondary | ICD-10-CM | POA: Diagnosis not present

## 2020-12-03 DIAGNOSIS — J9601 Acute respiratory failure with hypoxia: Secondary | ICD-10-CM | POA: Diagnosis not present

## 2020-12-03 DIAGNOSIS — R1319 Other dysphagia: Secondary | ICD-10-CM | POA: Diagnosis not present

## 2020-12-03 DIAGNOSIS — G2581 Restless legs syndrome: Secondary | ICD-10-CM | POA: Diagnosis not present

## 2020-12-03 DIAGNOSIS — I129 Hypertensive chronic kidney disease with stage 1 through stage 4 chronic kidney disease, or unspecified chronic kidney disease: Secondary | ICD-10-CM | POA: Diagnosis not present

## 2020-12-03 DIAGNOSIS — Z885 Allergy status to narcotic agent status: Secondary | ICD-10-CM | POA: Diagnosis not present

## 2020-12-03 DIAGNOSIS — J449 Chronic obstructive pulmonary disease, unspecified: Secondary | ICD-10-CM | POA: Diagnosis not present

## 2020-12-03 DIAGNOSIS — F32A Depression, unspecified: Secondary | ICD-10-CM | POA: Diagnosis not present

## 2020-12-03 DIAGNOSIS — R5381 Other malaise: Secondary | ICD-10-CM | POA: Diagnosis not present

## 2020-12-03 DIAGNOSIS — Z743 Need for continuous supervision: Secondary | ICD-10-CM | POA: Diagnosis not present

## 2020-12-03 DIAGNOSIS — S2242XG Multiple fractures of ribs, left side, subsequent encounter for fracture with delayed healing: Secondary | ICD-10-CM | POA: Diagnosis not present

## 2020-12-03 DIAGNOSIS — S2222XA Fracture of body of sternum, initial encounter for closed fracture: Secondary | ICD-10-CM | POA: Diagnosis not present

## 2020-12-03 DIAGNOSIS — K222 Esophageal obstruction: Secondary | ICD-10-CM | POA: Diagnosis not present

## 2020-12-03 DIAGNOSIS — K259 Gastric ulcer, unspecified as acute or chronic, without hemorrhage or perforation: Secondary | ICD-10-CM | POA: Diagnosis not present

## 2020-12-03 DIAGNOSIS — Z955 Presence of coronary angioplasty implant and graft: Secondary | ICD-10-CM | POA: Diagnosis not present

## 2020-12-03 DIAGNOSIS — F17200 Nicotine dependence, unspecified, uncomplicated: Secondary | ICD-10-CM | POA: Diagnosis not present

## 2020-12-03 DIAGNOSIS — E1140 Type 2 diabetes mellitus with diabetic neuropathy, unspecified: Secondary | ICD-10-CM | POA: Diagnosis not present

## 2020-12-03 DIAGNOSIS — E118 Type 2 diabetes mellitus with unspecified complications: Secondary | ICD-10-CM | POA: Diagnosis not present

## 2020-12-03 DIAGNOSIS — M810 Age-related osteoporosis without current pathological fracture: Secondary | ICD-10-CM | POA: Diagnosis not present

## 2020-12-03 DIAGNOSIS — Z7982 Long term (current) use of aspirin: Secondary | ICD-10-CM | POA: Diagnosis not present

## 2020-12-03 DIAGNOSIS — R1310 Dysphagia, unspecified: Secondary | ICD-10-CM | POA: Diagnosis not present

## 2020-12-03 DIAGNOSIS — E119 Type 2 diabetes mellitus without complications: Secondary | ICD-10-CM | POA: Diagnosis not present

## 2020-12-03 DIAGNOSIS — E1151 Type 2 diabetes mellitus with diabetic peripheral angiopathy without gangrene: Secondary | ICD-10-CM | POA: Diagnosis not present

## 2020-12-03 DIAGNOSIS — Z95810 Presence of automatic (implantable) cardiac defibrillator: Secondary | ICD-10-CM | POA: Diagnosis not present

## 2020-12-03 DIAGNOSIS — Z7984 Long term (current) use of oral hypoglycemic drugs: Secondary | ICD-10-CM | POA: Diagnosis not present

## 2020-12-03 DIAGNOSIS — G8929 Other chronic pain: Secondary | ICD-10-CM | POA: Diagnosis not present

## 2020-12-03 DIAGNOSIS — I739 Peripheral vascular disease, unspecified: Secondary | ICD-10-CM | POA: Diagnosis not present

## 2020-12-03 DIAGNOSIS — R279 Unspecified lack of coordination: Secondary | ICD-10-CM | POA: Diagnosis not present

## 2020-12-03 DIAGNOSIS — N1832 Chronic kidney disease, stage 3b: Secondary | ICD-10-CM | POA: Diagnosis not present

## 2020-12-03 DIAGNOSIS — R110 Nausea: Secondary | ICD-10-CM | POA: Diagnosis not present

## 2020-12-03 DIAGNOSIS — I110 Hypertensive heart disease with heart failure: Secondary | ICD-10-CM | POA: Diagnosis not present

## 2020-12-03 DIAGNOSIS — K219 Gastro-esophageal reflux disease without esophagitis: Secondary | ICD-10-CM | POA: Diagnosis not present

## 2020-12-03 DIAGNOSIS — I2510 Atherosclerotic heart disease of native coronary artery without angina pectoris: Secondary | ICD-10-CM | POA: Diagnosis not present

## 2020-12-03 DIAGNOSIS — S2221XA Fracture of manubrium, initial encounter for closed fracture: Secondary | ICD-10-CM | POA: Diagnosis not present

## 2020-12-03 DIAGNOSIS — Z79899 Other long term (current) drug therapy: Secondary | ICD-10-CM | POA: Diagnosis not present

## 2020-12-03 DIAGNOSIS — Z888 Allergy status to other drugs, medicaments and biological substances status: Secondary | ICD-10-CM | POA: Diagnosis not present

## 2020-12-03 DIAGNOSIS — J9611 Chronic respiratory failure with hypoxia: Secondary | ICD-10-CM | POA: Diagnosis not present

## 2020-12-03 DIAGNOSIS — I255 Ischemic cardiomyopathy: Secondary | ICD-10-CM | POA: Diagnosis not present

## 2020-12-03 DIAGNOSIS — J984 Other disorders of lung: Secondary | ICD-10-CM | POA: Diagnosis not present

## 2020-12-06 DIAGNOSIS — I5042 Chronic combined systolic (congestive) and diastolic (congestive) heart failure: Secondary | ICD-10-CM | POA: Diagnosis not present

## 2020-12-06 DIAGNOSIS — S2221XA Fracture of manubrium, initial encounter for closed fracture: Secondary | ICD-10-CM | POA: Diagnosis not present

## 2020-12-06 DIAGNOSIS — S22070A Wedge compression fracture of T9-T10 vertebra, initial encounter for closed fracture: Secondary | ICD-10-CM | POA: Diagnosis not present

## 2020-12-06 DIAGNOSIS — I2510 Atherosclerotic heart disease of native coronary artery without angina pectoris: Secondary | ICD-10-CM | POA: Diagnosis not present

## 2020-12-06 DIAGNOSIS — J449 Chronic obstructive pulmonary disease, unspecified: Secondary | ICD-10-CM | POA: Diagnosis not present

## 2020-12-06 DIAGNOSIS — I499 Cardiac arrhythmia, unspecified: Secondary | ICD-10-CM | POA: Diagnosis not present

## 2020-12-06 DIAGNOSIS — R6889 Other general symptoms and signs: Secondary | ICD-10-CM | POA: Diagnosis not present

## 2020-12-06 DIAGNOSIS — K219 Gastro-esophageal reflux disease without esophagitis: Secondary | ICD-10-CM | POA: Diagnosis not present

## 2020-12-06 DIAGNOSIS — S299XXA Unspecified injury of thorax, initial encounter: Secondary | ICD-10-CM | POA: Diagnosis not present

## 2020-12-06 DIAGNOSIS — S2232XA Fracture of one rib, left side, initial encounter for closed fracture: Secondary | ICD-10-CM | POA: Diagnosis not present

## 2020-12-06 DIAGNOSIS — I428 Other cardiomyopathies: Secondary | ICD-10-CM | POA: Diagnosis not present

## 2020-12-06 DIAGNOSIS — F32A Depression, unspecified: Secondary | ICD-10-CM | POA: Diagnosis not present

## 2020-12-06 DIAGNOSIS — S2242XG Multiple fractures of ribs, left side, subsequent encounter for fracture with delayed healing: Secondary | ICD-10-CM | POA: Diagnosis not present

## 2020-12-06 DIAGNOSIS — S2242XA Multiple fractures of ribs, left side, initial encounter for closed fracture: Secondary | ICD-10-CM | POA: Diagnosis not present

## 2020-12-06 DIAGNOSIS — S2222XA Fracture of body of sternum, initial encounter for closed fracture: Secondary | ICD-10-CM | POA: Diagnosis not present

## 2020-12-06 DIAGNOSIS — I130 Hypertensive heart and chronic kidney disease with heart failure and stage 1 through stage 4 chronic kidney disease, or unspecified chronic kidney disease: Secondary | ICD-10-CM | POA: Diagnosis not present

## 2020-12-06 DIAGNOSIS — R079 Chest pain, unspecified: Secondary | ICD-10-CM | POA: Diagnosis not present

## 2020-12-06 DIAGNOSIS — J9601 Acute respiratory failure with hypoxia: Secondary | ICD-10-CM | POA: Diagnosis not present

## 2020-12-06 DIAGNOSIS — J9611 Chronic respiratory failure with hypoxia: Secondary | ICD-10-CM | POA: Diagnosis not present

## 2020-12-06 DIAGNOSIS — R52 Pain, unspecified: Secondary | ICD-10-CM | POA: Diagnosis not present

## 2020-12-06 DIAGNOSIS — Z743 Need for continuous supervision: Secondary | ICD-10-CM | POA: Diagnosis not present

## 2020-12-06 DIAGNOSIS — E1165 Type 2 diabetes mellitus with hyperglycemia: Secondary | ICD-10-CM | POA: Diagnosis not present

## 2020-12-06 DIAGNOSIS — E785 Hyperlipidemia, unspecified: Secondary | ICD-10-CM | POA: Diagnosis not present

## 2020-12-07 DIAGNOSIS — E118 Type 2 diabetes mellitus with unspecified complications: Secondary | ICD-10-CM | POA: Diagnosis not present

## 2020-12-07 DIAGNOSIS — S2242XG Multiple fractures of ribs, left side, subsequent encounter for fracture with delayed healing: Secondary | ICD-10-CM | POA: Diagnosis not present

## 2020-12-07 DIAGNOSIS — N1832 Chronic kidney disease, stage 3b: Secondary | ICD-10-CM | POA: Diagnosis not present

## 2020-12-07 DIAGNOSIS — J449 Chronic obstructive pulmonary disease, unspecified: Secondary | ICD-10-CM | POA: Diagnosis not present

## 2020-12-07 DIAGNOSIS — I10 Essential (primary) hypertension: Secondary | ICD-10-CM | POA: Diagnosis not present

## 2020-12-07 DIAGNOSIS — J9611 Chronic respiratory failure with hypoxia: Secondary | ICD-10-CM | POA: Diagnosis not present

## 2020-12-07 DIAGNOSIS — S2232XA Fracture of one rib, left side, initial encounter for closed fracture: Secondary | ICD-10-CM | POA: Diagnosis not present

## 2020-12-07 DIAGNOSIS — S2242XA Multiple fractures of ribs, left side, initial encounter for closed fracture: Secondary | ICD-10-CM | POA: Diagnosis not present

## 2020-12-07 DIAGNOSIS — R911 Solitary pulmonary nodule: Secondary | ICD-10-CM | POA: Diagnosis not present

## 2020-12-07 DIAGNOSIS — I25119 Atherosclerotic heart disease of native coronary artery with unspecified angina pectoris: Secondary | ICD-10-CM | POA: Diagnosis not present

## 2020-12-08 DIAGNOSIS — J449 Chronic obstructive pulmonary disease, unspecified: Secondary | ICD-10-CM | POA: Diagnosis not present

## 2020-12-08 DIAGNOSIS — S2242XG Multiple fractures of ribs, left side, subsequent encounter for fracture with delayed healing: Secondary | ICD-10-CM | POA: Diagnosis not present

## 2020-12-08 DIAGNOSIS — S2222XA Fracture of body of sternum, initial encounter for closed fracture: Secondary | ICD-10-CM | POA: Diagnosis not present

## 2020-12-08 DIAGNOSIS — J9611 Chronic respiratory failure with hypoxia: Secondary | ICD-10-CM | POA: Diagnosis not present

## 2020-12-08 DIAGNOSIS — I25119 Atherosclerotic heart disease of native coronary artery with unspecified angina pectoris: Secondary | ICD-10-CM | POA: Diagnosis not present

## 2020-12-08 DIAGNOSIS — I10 Essential (primary) hypertension: Secondary | ICD-10-CM | POA: Diagnosis not present

## 2020-12-09 DIAGNOSIS — R1310 Dysphagia, unspecified: Secondary | ICD-10-CM | POA: Diagnosis not present

## 2020-12-09 DIAGNOSIS — K222 Esophageal obstruction: Secondary | ICD-10-CM | POA: Diagnosis not present

## 2020-12-09 DIAGNOSIS — S2222XA Fracture of body of sternum, initial encounter for closed fracture: Secondary | ICD-10-CM | POA: Diagnosis not present

## 2020-12-09 DIAGNOSIS — S2242XG Multiple fractures of ribs, left side, subsequent encounter for fracture with delayed healing: Secondary | ICD-10-CM | POA: Diagnosis not present

## 2020-12-09 NOTE — Progress Notes (Signed)
Remote ICD transmission.   

## 2020-12-10 DIAGNOSIS — S2222XA Fracture of body of sternum, initial encounter for closed fracture: Secondary | ICD-10-CM | POA: Diagnosis not present

## 2020-12-10 DIAGNOSIS — S2242XG Multiple fractures of ribs, left side, subsequent encounter for fracture with delayed healing: Secondary | ICD-10-CM | POA: Diagnosis not present

## 2020-12-10 DIAGNOSIS — R1319 Other dysphagia: Secondary | ICD-10-CM | POA: Diagnosis not present

## 2020-12-11 DIAGNOSIS — I509 Heart failure, unspecified: Secondary | ICD-10-CM | POA: Diagnosis not present

## 2020-12-11 DIAGNOSIS — D631 Anemia in chronic kidney disease: Secondary | ICD-10-CM | POA: Diagnosis not present

## 2020-12-11 DIAGNOSIS — I2510 Atherosclerotic heart disease of native coronary artery without angina pectoris: Secondary | ICD-10-CM | POA: Diagnosis not present

## 2020-12-11 DIAGNOSIS — I129 Hypertensive chronic kidney disease with stage 1 through stage 4 chronic kidney disease, or unspecified chronic kidney disease: Secondary | ICD-10-CM | POA: Diagnosis not present

## 2020-12-11 DIAGNOSIS — K219 Gastro-esophageal reflux disease without esophagitis: Secondary | ICD-10-CM | POA: Diagnosis not present

## 2020-12-11 DIAGNOSIS — Z950 Presence of cardiac pacemaker: Secondary | ICD-10-CM | POA: Diagnosis not present

## 2020-12-11 DIAGNOSIS — I480 Paroxysmal atrial fibrillation: Secondary | ICD-10-CM | POA: Diagnosis not present

## 2020-12-11 DIAGNOSIS — N1832 Chronic kidney disease, stage 3b: Secondary | ICD-10-CM | POA: Diagnosis not present

## 2020-12-11 DIAGNOSIS — K259 Gastric ulcer, unspecified as acute or chronic, without hemorrhage or perforation: Secondary | ICD-10-CM | POA: Diagnosis not present

## 2020-12-11 DIAGNOSIS — R1319 Other dysphagia: Secondary | ICD-10-CM | POA: Diagnosis not present

## 2020-12-11 DIAGNOSIS — S2242XG Multiple fractures of ribs, left side, subsequent encounter for fracture with delayed healing: Secondary | ICD-10-CM | POA: Diagnosis not present

## 2020-12-11 DIAGNOSIS — E1122 Type 2 diabetes mellitus with diabetic chronic kidney disease: Secondary | ICD-10-CM | POA: Diagnosis not present

## 2020-12-11 DIAGNOSIS — F17200 Nicotine dependence, unspecified, uncomplicated: Secondary | ICD-10-CM | POA: Diagnosis not present

## 2020-12-11 DIAGNOSIS — J449 Chronic obstructive pulmonary disease, unspecified: Secondary | ICD-10-CM | POA: Diagnosis not present

## 2020-12-11 DIAGNOSIS — K222 Esophageal obstruction: Secondary | ICD-10-CM | POA: Diagnosis not present

## 2020-12-12 DIAGNOSIS — K222 Esophageal obstruction: Secondary | ICD-10-CM | POA: Diagnosis not present

## 2020-12-12 DIAGNOSIS — R1319 Other dysphagia: Secondary | ICD-10-CM | POA: Diagnosis not present

## 2020-12-12 DIAGNOSIS — K259 Gastric ulcer, unspecified as acute or chronic, without hemorrhage or perforation: Secondary | ICD-10-CM | POA: Diagnosis not present

## 2020-12-12 DIAGNOSIS — S2242XG Multiple fractures of ribs, left side, subsequent encounter for fracture with delayed healing: Secondary | ICD-10-CM | POA: Diagnosis not present

## 2020-12-13 DIAGNOSIS — S2242XG Multiple fractures of ribs, left side, subsequent encounter for fracture with delayed healing: Secondary | ICD-10-CM | POA: Diagnosis not present

## 2020-12-14 DIAGNOSIS — S2242XG Multiple fractures of ribs, left side, subsequent encounter for fracture with delayed healing: Secondary | ICD-10-CM | POA: Diagnosis not present

## 2020-12-15 DIAGNOSIS — I480 Paroxysmal atrial fibrillation: Secondary | ICD-10-CM | POA: Diagnosis not present

## 2020-12-15 DIAGNOSIS — K219 Gastro-esophageal reflux disease without esophagitis: Secondary | ICD-10-CM | POA: Diagnosis not present

## 2020-12-15 DIAGNOSIS — E1140 Type 2 diabetes mellitus with diabetic neuropathy, unspecified: Secondary | ICD-10-CM | POA: Diagnosis not present

## 2020-12-15 DIAGNOSIS — G8929 Other chronic pain: Secondary | ICD-10-CM | POA: Diagnosis not present

## 2020-12-15 DIAGNOSIS — I255 Ischemic cardiomyopathy: Secondary | ICD-10-CM | POA: Diagnosis not present

## 2020-12-15 DIAGNOSIS — S2242XG Multiple fractures of ribs, left side, subsequent encounter for fracture with delayed healing: Secondary | ICD-10-CM | POA: Diagnosis not present

## 2020-12-15 DIAGNOSIS — E119 Type 2 diabetes mellitus without complications: Secondary | ICD-10-CM | POA: Diagnosis not present

## 2020-12-15 DIAGNOSIS — I5042 Chronic combined systolic (congestive) and diastolic (congestive) heart failure: Secondary | ICD-10-CM | POA: Diagnosis not present

## 2020-12-15 DIAGNOSIS — M810 Age-related osteoporosis without current pathological fracture: Secondary | ICD-10-CM | POA: Diagnosis not present

## 2020-12-15 DIAGNOSIS — I2510 Atherosclerotic heart disease of native coronary artery without angina pectoris: Secondary | ICD-10-CM | POA: Diagnosis not present

## 2020-12-15 DIAGNOSIS — I10 Essential (primary) hypertension: Secondary | ICD-10-CM | POA: Diagnosis not present

## 2020-12-15 DIAGNOSIS — J449 Chronic obstructive pulmonary disease, unspecified: Secondary | ICD-10-CM | POA: Diagnosis not present

## 2020-12-16 DIAGNOSIS — S2242XA Multiple fractures of ribs, left side, initial encounter for closed fracture: Secondary | ICD-10-CM | POA: Diagnosis not present

## 2020-12-16 DIAGNOSIS — J449 Chronic obstructive pulmonary disease, unspecified: Secondary | ICD-10-CM | POA: Diagnosis not present

## 2020-12-16 DIAGNOSIS — I25119 Atherosclerotic heart disease of native coronary artery with unspecified angina pectoris: Secondary | ICD-10-CM | POA: Diagnosis not present

## 2020-12-16 DIAGNOSIS — J9611 Chronic respiratory failure with hypoxia: Secondary | ICD-10-CM | POA: Diagnosis not present

## 2020-12-16 DIAGNOSIS — R911 Solitary pulmonary nodule: Secondary | ICD-10-CM | POA: Diagnosis not present

## 2020-12-16 DIAGNOSIS — I10 Essential (primary) hypertension: Secondary | ICD-10-CM | POA: Diagnosis not present

## 2020-12-16 DIAGNOSIS — E118 Type 2 diabetes mellitus with unspecified complications: Secondary | ICD-10-CM | POA: Diagnosis not present

## 2020-12-16 DIAGNOSIS — N1832 Chronic kidney disease, stage 3b: Secondary | ICD-10-CM | POA: Diagnosis not present

## 2020-12-16 DIAGNOSIS — S2242XG Multiple fractures of ribs, left side, subsequent encounter for fracture with delayed healing: Secondary | ICD-10-CM | POA: Diagnosis not present

## 2020-12-17 DIAGNOSIS — R0781 Pleurodynia: Secondary | ICD-10-CM | POA: Diagnosis not present

## 2020-12-17 DIAGNOSIS — K219 Gastro-esophageal reflux disease without esophagitis: Secondary | ICD-10-CM | POA: Diagnosis not present

## 2020-12-17 DIAGNOSIS — I480 Paroxysmal atrial fibrillation: Secondary | ICD-10-CM | POA: Diagnosis not present

## 2020-12-17 DIAGNOSIS — I700 Atherosclerosis of aorta: Secondary | ICD-10-CM | POA: Diagnosis not present

## 2020-12-17 DIAGNOSIS — Z7401 Bed confinement status: Secondary | ICD-10-CM | POA: Diagnosis not present

## 2020-12-17 DIAGNOSIS — G8911 Acute pain due to trauma: Secondary | ICD-10-CM | POA: Diagnosis not present

## 2020-12-17 DIAGNOSIS — E785 Hyperlipidemia, unspecified: Secondary | ICD-10-CM | POA: Diagnosis not present

## 2020-12-17 DIAGNOSIS — R079 Chest pain, unspecified: Secondary | ICD-10-CM | POA: Diagnosis not present

## 2020-12-17 DIAGNOSIS — R1312 Dysphagia, oropharyngeal phase: Secondary | ICD-10-CM | POA: Diagnosis not present

## 2020-12-17 DIAGNOSIS — Z950 Presence of cardiac pacemaker: Secondary | ICD-10-CM | POA: Diagnosis not present

## 2020-12-17 DIAGNOSIS — N1832 Chronic kidney disease, stage 3b: Secondary | ICD-10-CM | POA: Diagnosis not present

## 2020-12-17 DIAGNOSIS — R5381 Other malaise: Secondary | ICD-10-CM | POA: Diagnosis not present

## 2020-12-17 DIAGNOSIS — J449 Chronic obstructive pulmonary disease, unspecified: Secondary | ICD-10-CM | POA: Diagnosis not present

## 2020-12-17 DIAGNOSIS — S2223XG Sternal manubrial dissociation, subsequent encounter for fracture with delayed healing: Secondary | ICD-10-CM | POA: Diagnosis not present

## 2020-12-17 DIAGNOSIS — S2242XA Multiple fractures of ribs, left side, initial encounter for closed fracture: Secondary | ICD-10-CM | POA: Diagnosis not present

## 2020-12-17 DIAGNOSIS — E7849 Other hyperlipidemia: Secondary | ICD-10-CM | POA: Diagnosis not present

## 2020-12-17 DIAGNOSIS — R1319 Other dysphagia: Secondary | ICD-10-CM | POA: Diagnosis not present

## 2020-12-17 DIAGNOSIS — I739 Peripheral vascular disease, unspecified: Secondary | ICD-10-CM | POA: Diagnosis not present

## 2020-12-17 DIAGNOSIS — M353 Polymyalgia rheumatica: Secondary | ICD-10-CM | POA: Diagnosis not present

## 2020-12-17 DIAGNOSIS — I25119 Atherosclerotic heart disease of native coronary artery with unspecified angina pectoris: Secondary | ICD-10-CM | POA: Diagnosis not present

## 2020-12-17 DIAGNOSIS — R911 Solitary pulmonary nodule: Secondary | ICD-10-CM | POA: Diagnosis not present

## 2020-12-17 DIAGNOSIS — Z888 Allergy status to other drugs, medicaments and biological substances status: Secondary | ICD-10-CM | POA: Diagnosis not present

## 2020-12-17 DIAGNOSIS — E118 Type 2 diabetes mellitus with unspecified complications: Secondary | ICD-10-CM | POA: Diagnosis not present

## 2020-12-17 DIAGNOSIS — Z7982 Long term (current) use of aspirin: Secondary | ICD-10-CM | POA: Diagnosis not present

## 2020-12-17 DIAGNOSIS — J439 Emphysema, unspecified: Secondary | ICD-10-CM | POA: Diagnosis not present

## 2020-12-17 DIAGNOSIS — M810 Age-related osteoporosis without current pathological fracture: Secondary | ICD-10-CM | POA: Diagnosis not present

## 2020-12-17 DIAGNOSIS — Z87891 Personal history of nicotine dependence: Secondary | ICD-10-CM | POA: Diagnosis not present

## 2020-12-17 DIAGNOSIS — J9611 Chronic respiratory failure with hypoxia: Secondary | ICD-10-CM | POA: Diagnosis not present

## 2020-12-17 DIAGNOSIS — J984 Other disorders of lung: Secondary | ICD-10-CM | POA: Diagnosis not present

## 2020-12-17 DIAGNOSIS — M2550 Pain in unspecified joint: Secondary | ICD-10-CM | POA: Diagnosis not present

## 2020-12-17 DIAGNOSIS — R1310 Dysphagia, unspecified: Secondary | ICD-10-CM | POA: Diagnosis not present

## 2020-12-17 DIAGNOSIS — Z79899 Other long term (current) drug therapy: Secondary | ICD-10-CM | POA: Diagnosis not present

## 2020-12-17 DIAGNOSIS — E1122 Type 2 diabetes mellitus with diabetic chronic kidney disease: Secondary | ICD-10-CM | POA: Diagnosis not present

## 2020-12-17 DIAGNOSIS — J9811 Atelectasis: Secondary | ICD-10-CM | POA: Diagnosis not present

## 2020-12-17 DIAGNOSIS — R279 Unspecified lack of coordination: Secondary | ICD-10-CM | POA: Diagnosis not present

## 2020-12-17 DIAGNOSIS — I10 Essential (primary) hypertension: Secondary | ICD-10-CM | POA: Diagnosis not present

## 2020-12-17 DIAGNOSIS — E119 Type 2 diabetes mellitus without complications: Secondary | ICD-10-CM | POA: Diagnosis not present

## 2020-12-17 DIAGNOSIS — G252 Other specified forms of tremor: Secondary | ICD-10-CM | POA: Diagnosis not present

## 2020-12-17 DIAGNOSIS — R6889 Other general symptoms and signs: Secondary | ICD-10-CM | POA: Diagnosis not present

## 2020-12-17 DIAGNOSIS — I2510 Atherosclerotic heart disease of native coronary artery without angina pectoris: Secondary | ICD-10-CM | POA: Diagnosis not present

## 2020-12-17 DIAGNOSIS — E46 Unspecified protein-calorie malnutrition: Secondary | ICD-10-CM | POA: Diagnosis not present

## 2020-12-17 DIAGNOSIS — R0789 Other chest pain: Secondary | ICD-10-CM | POA: Diagnosis not present

## 2020-12-17 DIAGNOSIS — Z743 Need for continuous supervision: Secondary | ICD-10-CM | POA: Diagnosis not present

## 2020-12-17 DIAGNOSIS — S2242XG Multiple fractures of ribs, left side, subsequent encounter for fracture with delayed healing: Secondary | ICD-10-CM | POA: Diagnosis not present

## 2020-12-17 DIAGNOSIS — I7389 Other specified peripheral vascular diseases: Secondary | ICD-10-CM | POA: Diagnosis not present

## 2020-12-19 DIAGNOSIS — I25119 Atherosclerotic heart disease of native coronary artery with unspecified angina pectoris: Secondary | ICD-10-CM | POA: Diagnosis not present

## 2020-12-19 DIAGNOSIS — E1122 Type 2 diabetes mellitus with diabetic chronic kidney disease: Secondary | ICD-10-CM | POA: Diagnosis not present

## 2020-12-19 DIAGNOSIS — I480 Paroxysmal atrial fibrillation: Secondary | ICD-10-CM | POA: Diagnosis not present

## 2020-12-19 DIAGNOSIS — I7389 Other specified peripheral vascular diseases: Secondary | ICD-10-CM | POA: Diagnosis not present

## 2020-12-19 DIAGNOSIS — N1832 Chronic kidney disease, stage 3b: Secondary | ICD-10-CM | POA: Diagnosis not present

## 2020-12-19 DIAGNOSIS — Z950 Presence of cardiac pacemaker: Secondary | ICD-10-CM | POA: Diagnosis not present

## 2020-12-19 DIAGNOSIS — I10 Essential (primary) hypertension: Secondary | ICD-10-CM | POA: Diagnosis not present

## 2020-12-19 DIAGNOSIS — R1319 Other dysphagia: Secondary | ICD-10-CM | POA: Diagnosis not present

## 2020-12-19 DIAGNOSIS — M353 Polymyalgia rheumatica: Secondary | ICD-10-CM | POA: Diagnosis not present

## 2020-12-19 DIAGNOSIS — E7849 Other hyperlipidemia: Secondary | ICD-10-CM | POA: Diagnosis not present

## 2020-12-19 DIAGNOSIS — S2242XG Multiple fractures of ribs, left side, subsequent encounter for fracture with delayed healing: Secondary | ICD-10-CM | POA: Diagnosis not present

## 2020-12-19 DIAGNOSIS — S2223XG Sternal manubrial dissociation, subsequent encounter for fracture with delayed healing: Secondary | ICD-10-CM | POA: Diagnosis not present

## 2020-12-26 DIAGNOSIS — R0781 Pleurodynia: Secondary | ICD-10-CM | POA: Diagnosis not present

## 2020-12-26 DIAGNOSIS — M810 Age-related osteoporosis without current pathological fracture: Secondary | ICD-10-CM | POA: Diagnosis not present

## 2020-12-26 DIAGNOSIS — E46 Unspecified protein-calorie malnutrition: Secondary | ICD-10-CM | POA: Diagnosis not present

## 2020-12-28 DIAGNOSIS — I700 Atherosclerosis of aorta: Secondary | ICD-10-CM | POA: Diagnosis not present

## 2020-12-28 DIAGNOSIS — J449 Chronic obstructive pulmonary disease, unspecified: Secondary | ICD-10-CM | POA: Diagnosis not present

## 2020-12-28 DIAGNOSIS — Z87891 Personal history of nicotine dependence: Secondary | ICD-10-CM | POA: Diagnosis not present

## 2020-12-28 DIAGNOSIS — I2510 Atherosclerotic heart disease of native coronary artery without angina pectoris: Secondary | ICD-10-CM | POA: Diagnosis not present

## 2020-12-28 DIAGNOSIS — K219 Gastro-esophageal reflux disease without esophagitis: Secondary | ICD-10-CM | POA: Diagnosis not present

## 2020-12-28 DIAGNOSIS — J9811 Atelectasis: Secondary | ICD-10-CM | POA: Diagnosis not present

## 2020-12-28 DIAGNOSIS — G8911 Acute pain due to trauma: Secondary | ICD-10-CM | POA: Diagnosis not present

## 2020-12-28 DIAGNOSIS — R0789 Other chest pain: Secondary | ICD-10-CM | POA: Diagnosis not present

## 2020-12-28 DIAGNOSIS — Z79899 Other long term (current) drug therapy: Secondary | ICD-10-CM | POA: Diagnosis not present

## 2020-12-28 DIAGNOSIS — I10 Essential (primary) hypertension: Secondary | ICD-10-CM | POA: Diagnosis not present

## 2020-12-28 DIAGNOSIS — Z7982 Long term (current) use of aspirin: Secondary | ICD-10-CM | POA: Diagnosis not present

## 2020-12-28 DIAGNOSIS — E785 Hyperlipidemia, unspecified: Secondary | ICD-10-CM | POA: Diagnosis not present

## 2020-12-28 DIAGNOSIS — J439 Emphysema, unspecified: Secondary | ICD-10-CM | POA: Diagnosis not present

## 2020-12-28 DIAGNOSIS — Z888 Allergy status to other drugs, medicaments and biological substances status: Secondary | ICD-10-CM | POA: Diagnosis not present

## 2020-12-29 DIAGNOSIS — Z743 Need for continuous supervision: Secondary | ICD-10-CM | POA: Diagnosis not present

## 2020-12-29 DIAGNOSIS — Z7401 Bed confinement status: Secondary | ICD-10-CM | POA: Diagnosis not present

## 2020-12-29 DIAGNOSIS — R6889 Other general symptoms and signs: Secondary | ICD-10-CM | POA: Diagnosis not present

## 2020-12-29 DIAGNOSIS — M2550 Pain in unspecified joint: Secondary | ICD-10-CM | POA: Diagnosis not present

## 2020-12-30 DIAGNOSIS — E1122 Type 2 diabetes mellitus with diabetic chronic kidney disease: Secondary | ICD-10-CM | POA: Diagnosis not present

## 2020-12-30 DIAGNOSIS — S2223XG Sternal manubrial dissociation, subsequent encounter for fracture with delayed healing: Secondary | ICD-10-CM | POA: Diagnosis not present

## 2020-12-30 DIAGNOSIS — R079 Chest pain, unspecified: Secondary | ICD-10-CM | POA: Diagnosis not present

## 2020-12-30 DIAGNOSIS — S2242XG Multiple fractures of ribs, left side, subsequent encounter for fracture with delayed healing: Secondary | ICD-10-CM | POA: Diagnosis not present

## 2020-12-31 DIAGNOSIS — S2223XG Sternal manubrial dissociation, subsequent encounter for fracture with delayed healing: Secondary | ICD-10-CM | POA: Diagnosis not present

## 2020-12-31 DIAGNOSIS — S2242XG Multiple fractures of ribs, left side, subsequent encounter for fracture with delayed healing: Secondary | ICD-10-CM | POA: Diagnosis not present

## 2020-12-31 DIAGNOSIS — I10 Essential (primary) hypertension: Secondary | ICD-10-CM | POA: Diagnosis not present

## 2020-12-31 DIAGNOSIS — R1310 Dysphagia, unspecified: Secondary | ICD-10-CM | POA: Diagnosis not present

## 2021-01-02 DIAGNOSIS — N1832 Chronic kidney disease, stage 3b: Secondary | ICD-10-CM | POA: Diagnosis not present

## 2021-01-02 DIAGNOSIS — J9611 Chronic respiratory failure with hypoxia: Secondary | ICD-10-CM | POA: Diagnosis not present

## 2021-01-02 DIAGNOSIS — J449 Chronic obstructive pulmonary disease, unspecified: Secondary | ICD-10-CM | POA: Diagnosis not present

## 2021-01-02 DIAGNOSIS — S2242XG Multiple fractures of ribs, left side, subsequent encounter for fracture with delayed healing: Secondary | ICD-10-CM | POA: Diagnosis not present

## 2021-01-04 DIAGNOSIS — I509 Heart failure, unspecified: Secondary | ICD-10-CM | POA: Diagnosis not present

## 2021-01-04 DIAGNOSIS — N200 Calculus of kidney: Secondary | ICD-10-CM | POA: Diagnosis not present

## 2021-01-04 DIAGNOSIS — N1832 Chronic kidney disease, stage 3b: Secondary | ICD-10-CM | POA: Diagnosis not present

## 2021-01-04 DIAGNOSIS — E1151 Type 2 diabetes mellitus with diabetic peripheral angiopathy without gangrene: Secondary | ICD-10-CM | POA: Diagnosis not present

## 2021-01-04 DIAGNOSIS — G252 Other specified forms of tremor: Secondary | ICD-10-CM | POA: Diagnosis not present

## 2021-01-04 DIAGNOSIS — M419 Scoliosis, unspecified: Secondary | ICD-10-CM | POA: Diagnosis not present

## 2021-01-04 DIAGNOSIS — I480 Paroxysmal atrial fibrillation: Secondary | ICD-10-CM | POA: Diagnosis not present

## 2021-01-04 DIAGNOSIS — E1122 Type 2 diabetes mellitus with diabetic chronic kidney disease: Secondary | ICD-10-CM | POA: Diagnosis not present

## 2021-01-04 DIAGNOSIS — D631 Anemia in chronic kidney disease: Secondary | ICD-10-CM | POA: Diagnosis not present

## 2021-01-04 DIAGNOSIS — I130 Hypertensive heart and chronic kidney disease with heart failure and stage 1 through stage 4 chronic kidney disease, or unspecified chronic kidney disease: Secondary | ICD-10-CM | POA: Diagnosis not present

## 2021-01-04 DIAGNOSIS — M800AXG Age-related osteoporosis with current pathological fracture, other site, subsequent encounter for fracture with delayed healing: Secondary | ICD-10-CM | POA: Diagnosis not present

## 2021-01-04 DIAGNOSIS — E46 Unspecified protein-calorie malnutrition: Secondary | ICD-10-CM | POA: Diagnosis not present

## 2021-01-04 DIAGNOSIS — M1990 Unspecified osteoarthritis, unspecified site: Secondary | ICD-10-CM | POA: Diagnosis not present

## 2021-01-04 DIAGNOSIS — I2510 Atherosclerotic heart disease of native coronary artery without angina pectoris: Secondary | ICD-10-CM | POA: Diagnosis not present

## 2021-01-04 DIAGNOSIS — G2581 Restless legs syndrome: Secondary | ICD-10-CM | POA: Diagnosis not present

## 2021-01-04 DIAGNOSIS — I2583 Coronary atherosclerosis due to lipid rich plaque: Secondary | ICD-10-CM | POA: Diagnosis not present

## 2021-01-04 DIAGNOSIS — J9611 Chronic respiratory failure with hypoxia: Secondary | ICD-10-CM | POA: Diagnosis not present

## 2021-01-04 DIAGNOSIS — M353 Polymyalgia rheumatica: Secondary | ICD-10-CM | POA: Diagnosis not present

## 2021-01-04 DIAGNOSIS — E7800 Pure hypercholesterolemia, unspecified: Secondary | ICD-10-CM | POA: Diagnosis not present

## 2021-01-04 DIAGNOSIS — M8008XD Age-related osteoporosis with current pathological fracture, vertebra(e), subsequent encounter for fracture with routine healing: Secondary | ICD-10-CM | POA: Diagnosis not present

## 2021-01-04 DIAGNOSIS — I255 Ischemic cardiomyopathy: Secondary | ICD-10-CM | POA: Diagnosis not present

## 2021-01-04 DIAGNOSIS — R1312 Dysphagia, oropharyngeal phase: Secondary | ICD-10-CM | POA: Diagnosis not present

## 2021-01-04 DIAGNOSIS — J432 Centrilobular emphysema: Secondary | ICD-10-CM | POA: Diagnosis not present

## 2021-01-04 DIAGNOSIS — F32A Depression, unspecified: Secondary | ICD-10-CM | POA: Diagnosis not present

## 2021-01-07 DIAGNOSIS — G252 Other specified forms of tremor: Secondary | ICD-10-CM | POA: Diagnosis not present

## 2021-01-07 DIAGNOSIS — G2581 Restless legs syndrome: Secondary | ICD-10-CM | POA: Diagnosis not present

## 2021-01-07 DIAGNOSIS — N1832 Chronic kidney disease, stage 3b: Secondary | ICD-10-CM | POA: Diagnosis not present

## 2021-01-07 DIAGNOSIS — R1312 Dysphagia, oropharyngeal phase: Secondary | ICD-10-CM | POA: Diagnosis not present

## 2021-01-07 DIAGNOSIS — J432 Centrilobular emphysema: Secondary | ICD-10-CM | POA: Diagnosis not present

## 2021-01-07 DIAGNOSIS — N200 Calculus of kidney: Secondary | ICD-10-CM | POA: Diagnosis not present

## 2021-01-07 DIAGNOSIS — I130 Hypertensive heart and chronic kidney disease with heart failure and stage 1 through stage 4 chronic kidney disease, or unspecified chronic kidney disease: Secondary | ICD-10-CM | POA: Diagnosis not present

## 2021-01-07 DIAGNOSIS — M8008XD Age-related osteoporosis with current pathological fracture, vertebra(e), subsequent encounter for fracture with routine healing: Secondary | ICD-10-CM | POA: Diagnosis not present

## 2021-01-07 DIAGNOSIS — F32A Depression, unspecified: Secondary | ICD-10-CM | POA: Diagnosis not present

## 2021-01-07 DIAGNOSIS — I2583 Coronary atherosclerosis due to lipid rich plaque: Secondary | ICD-10-CM | POA: Diagnosis not present

## 2021-01-07 DIAGNOSIS — I2510 Atherosclerotic heart disease of native coronary artery without angina pectoris: Secondary | ICD-10-CM | POA: Diagnosis not present

## 2021-01-07 DIAGNOSIS — M1990 Unspecified osteoarthritis, unspecified site: Secondary | ICD-10-CM | POA: Diagnosis not present

## 2021-01-07 DIAGNOSIS — E7800 Pure hypercholesterolemia, unspecified: Secondary | ICD-10-CM | POA: Diagnosis not present

## 2021-01-07 DIAGNOSIS — E1151 Type 2 diabetes mellitus with diabetic peripheral angiopathy without gangrene: Secondary | ICD-10-CM | POA: Diagnosis not present

## 2021-01-07 DIAGNOSIS — J9611 Chronic respiratory failure with hypoxia: Secondary | ICD-10-CM | POA: Diagnosis not present

## 2021-01-07 DIAGNOSIS — E1122 Type 2 diabetes mellitus with diabetic chronic kidney disease: Secondary | ICD-10-CM | POA: Diagnosis not present

## 2021-01-07 DIAGNOSIS — I255 Ischemic cardiomyopathy: Secondary | ICD-10-CM | POA: Diagnosis not present

## 2021-01-07 DIAGNOSIS — M353 Polymyalgia rheumatica: Secondary | ICD-10-CM | POA: Diagnosis not present

## 2021-01-07 DIAGNOSIS — I509 Heart failure, unspecified: Secondary | ICD-10-CM | POA: Diagnosis not present

## 2021-01-07 DIAGNOSIS — M419 Scoliosis, unspecified: Secondary | ICD-10-CM | POA: Diagnosis not present

## 2021-01-07 DIAGNOSIS — E46 Unspecified protein-calorie malnutrition: Secondary | ICD-10-CM | POA: Diagnosis not present

## 2021-01-07 DIAGNOSIS — I480 Paroxysmal atrial fibrillation: Secondary | ICD-10-CM | POA: Diagnosis not present

## 2021-01-07 DIAGNOSIS — D631 Anemia in chronic kidney disease: Secondary | ICD-10-CM | POA: Diagnosis not present

## 2021-01-07 DIAGNOSIS — M800AXG Age-related osteoporosis with current pathological fracture, other site, subsequent encounter for fracture with delayed healing: Secondary | ICD-10-CM | POA: Diagnosis not present

## 2021-01-12 DIAGNOSIS — M353 Polymyalgia rheumatica: Secondary | ICD-10-CM | POA: Diagnosis not present

## 2021-01-12 DIAGNOSIS — D631 Anemia in chronic kidney disease: Secondary | ICD-10-CM | POA: Diagnosis not present

## 2021-01-12 DIAGNOSIS — I130 Hypertensive heart and chronic kidney disease with heart failure and stage 1 through stage 4 chronic kidney disease, or unspecified chronic kidney disease: Secondary | ICD-10-CM | POA: Diagnosis not present

## 2021-01-12 DIAGNOSIS — M1990 Unspecified osteoarthritis, unspecified site: Secondary | ICD-10-CM | POA: Diagnosis not present

## 2021-01-12 DIAGNOSIS — I2583 Coronary atherosclerosis due to lipid rich plaque: Secondary | ICD-10-CM | POA: Diagnosis not present

## 2021-01-12 DIAGNOSIS — I480 Paroxysmal atrial fibrillation: Secondary | ICD-10-CM | POA: Diagnosis not present

## 2021-01-12 DIAGNOSIS — I255 Ischemic cardiomyopathy: Secondary | ICD-10-CM | POA: Diagnosis not present

## 2021-01-12 DIAGNOSIS — J432 Centrilobular emphysema: Secondary | ICD-10-CM | POA: Diagnosis not present

## 2021-01-12 DIAGNOSIS — I2510 Atherosclerotic heart disease of native coronary artery without angina pectoris: Secondary | ICD-10-CM | POA: Diagnosis not present

## 2021-01-12 DIAGNOSIS — R1312 Dysphagia, oropharyngeal phase: Secondary | ICD-10-CM | POA: Diagnosis not present

## 2021-01-12 DIAGNOSIS — M8008XD Age-related osteoporosis with current pathological fracture, vertebra(e), subsequent encounter for fracture with routine healing: Secondary | ICD-10-CM | POA: Diagnosis not present

## 2021-01-12 DIAGNOSIS — E1122 Type 2 diabetes mellitus with diabetic chronic kidney disease: Secondary | ICD-10-CM | POA: Diagnosis not present

## 2021-01-12 DIAGNOSIS — N1832 Chronic kidney disease, stage 3b: Secondary | ICD-10-CM | POA: Diagnosis not present

## 2021-01-12 DIAGNOSIS — F32A Depression, unspecified: Secondary | ICD-10-CM | POA: Diagnosis not present

## 2021-01-12 DIAGNOSIS — G252 Other specified forms of tremor: Secondary | ICD-10-CM | POA: Diagnosis not present

## 2021-01-12 DIAGNOSIS — M419 Scoliosis, unspecified: Secondary | ICD-10-CM | POA: Diagnosis not present

## 2021-01-12 DIAGNOSIS — E7800 Pure hypercholesterolemia, unspecified: Secondary | ICD-10-CM | POA: Diagnosis not present

## 2021-01-12 DIAGNOSIS — N200 Calculus of kidney: Secondary | ICD-10-CM | POA: Diagnosis not present

## 2021-01-12 DIAGNOSIS — G2581 Restless legs syndrome: Secondary | ICD-10-CM | POA: Diagnosis not present

## 2021-01-12 DIAGNOSIS — M800AXG Age-related osteoporosis with current pathological fracture, other site, subsequent encounter for fracture with delayed healing: Secondary | ICD-10-CM | POA: Diagnosis not present

## 2021-01-12 DIAGNOSIS — J9611 Chronic respiratory failure with hypoxia: Secondary | ICD-10-CM | POA: Diagnosis not present

## 2021-01-12 DIAGNOSIS — E1151 Type 2 diabetes mellitus with diabetic peripheral angiopathy without gangrene: Secondary | ICD-10-CM | POA: Diagnosis not present

## 2021-01-12 DIAGNOSIS — E46 Unspecified protein-calorie malnutrition: Secondary | ICD-10-CM | POA: Diagnosis not present

## 2021-01-12 DIAGNOSIS — I509 Heart failure, unspecified: Secondary | ICD-10-CM | POA: Diagnosis not present

## 2021-01-13 DIAGNOSIS — M810 Age-related osteoporosis without current pathological fracture: Secondary | ICD-10-CM | POA: Diagnosis not present

## 2021-01-13 DIAGNOSIS — I739 Peripheral vascular disease, unspecified: Secondary | ICD-10-CM | POA: Diagnosis not present

## 2021-01-13 DIAGNOSIS — K1120 Sialoadenitis, unspecified: Secondary | ICD-10-CM | POA: Diagnosis not present

## 2021-01-13 DIAGNOSIS — I5042 Chronic combined systolic (congestive) and diastolic (congestive) heart failure: Secondary | ICD-10-CM | POA: Diagnosis not present

## 2021-01-13 DIAGNOSIS — E440 Moderate protein-calorie malnutrition: Secondary | ICD-10-CM | POA: Diagnosis not present

## 2021-01-13 DIAGNOSIS — I10 Essential (primary) hypertension: Secondary | ICD-10-CM | POA: Diagnosis not present

## 2021-01-13 DIAGNOSIS — J449 Chronic obstructive pulmonary disease, unspecified: Secondary | ICD-10-CM | POA: Diagnosis not present

## 2021-01-13 DIAGNOSIS — K279 Peptic ulcer, site unspecified, unspecified as acute or chronic, without hemorrhage or perforation: Secondary | ICD-10-CM | POA: Diagnosis not present

## 2021-01-13 DIAGNOSIS — I255 Ischemic cardiomyopathy: Secondary | ICD-10-CM | POA: Diagnosis not present

## 2021-01-13 DIAGNOSIS — I2510 Atherosclerotic heart disease of native coronary artery without angina pectoris: Secondary | ICD-10-CM | POA: Diagnosis not present

## 2021-01-13 DIAGNOSIS — E1140 Type 2 diabetes mellitus with diabetic neuropathy, unspecified: Secondary | ICD-10-CM | POA: Diagnosis not present

## 2021-01-14 DIAGNOSIS — E1122 Type 2 diabetes mellitus with diabetic chronic kidney disease: Secondary | ICD-10-CM | POA: Diagnosis not present

## 2021-01-14 DIAGNOSIS — I130 Hypertensive heart and chronic kidney disease with heart failure and stage 1 through stage 4 chronic kidney disease, or unspecified chronic kidney disease: Secondary | ICD-10-CM | POA: Diagnosis not present

## 2021-01-14 DIAGNOSIS — M800AXG Age-related osteoporosis with current pathological fracture, other site, subsequent encounter for fracture with delayed healing: Secondary | ICD-10-CM | POA: Diagnosis not present

## 2021-01-14 DIAGNOSIS — E1151 Type 2 diabetes mellitus with diabetic peripheral angiopathy without gangrene: Secondary | ICD-10-CM | POA: Diagnosis not present

## 2021-01-14 DIAGNOSIS — M1990 Unspecified osteoarthritis, unspecified site: Secondary | ICD-10-CM | POA: Diagnosis not present

## 2021-01-14 DIAGNOSIS — G2581 Restless legs syndrome: Secondary | ICD-10-CM | POA: Diagnosis not present

## 2021-01-14 DIAGNOSIS — M353 Polymyalgia rheumatica: Secondary | ICD-10-CM | POA: Diagnosis not present

## 2021-01-14 DIAGNOSIS — J432 Centrilobular emphysema: Secondary | ICD-10-CM | POA: Diagnosis not present

## 2021-01-14 DIAGNOSIS — I2510 Atherosclerotic heart disease of native coronary artery without angina pectoris: Secondary | ICD-10-CM | POA: Diagnosis not present

## 2021-01-14 DIAGNOSIS — E46 Unspecified protein-calorie malnutrition: Secondary | ICD-10-CM | POA: Diagnosis not present

## 2021-01-14 DIAGNOSIS — D631 Anemia in chronic kidney disease: Secondary | ICD-10-CM | POA: Diagnosis not present

## 2021-01-14 DIAGNOSIS — F32A Depression, unspecified: Secondary | ICD-10-CM | POA: Diagnosis not present

## 2021-01-14 DIAGNOSIS — M419 Scoliosis, unspecified: Secondary | ICD-10-CM | POA: Diagnosis not present

## 2021-01-14 DIAGNOSIS — I2583 Coronary atherosclerosis due to lipid rich plaque: Secondary | ICD-10-CM | POA: Diagnosis not present

## 2021-01-14 DIAGNOSIS — I255 Ischemic cardiomyopathy: Secondary | ICD-10-CM | POA: Diagnosis not present

## 2021-01-14 DIAGNOSIS — R1312 Dysphagia, oropharyngeal phase: Secondary | ICD-10-CM | POA: Diagnosis not present

## 2021-01-14 DIAGNOSIS — I509 Heart failure, unspecified: Secondary | ICD-10-CM | POA: Diagnosis not present

## 2021-01-14 DIAGNOSIS — N200 Calculus of kidney: Secondary | ICD-10-CM | POA: Diagnosis not present

## 2021-01-14 DIAGNOSIS — I480 Paroxysmal atrial fibrillation: Secondary | ICD-10-CM | POA: Diagnosis not present

## 2021-01-14 DIAGNOSIS — G252 Other specified forms of tremor: Secondary | ICD-10-CM | POA: Diagnosis not present

## 2021-01-14 DIAGNOSIS — J9611 Chronic respiratory failure with hypoxia: Secondary | ICD-10-CM | POA: Diagnosis not present

## 2021-01-14 DIAGNOSIS — E7800 Pure hypercholesterolemia, unspecified: Secondary | ICD-10-CM | POA: Diagnosis not present

## 2021-01-14 DIAGNOSIS — M8008XD Age-related osteoporosis with current pathological fracture, vertebra(e), subsequent encounter for fracture with routine healing: Secondary | ICD-10-CM | POA: Diagnosis not present

## 2021-01-14 DIAGNOSIS — N1832 Chronic kidney disease, stage 3b: Secondary | ICD-10-CM | POA: Diagnosis not present

## 2021-01-16 DIAGNOSIS — N200 Calculus of kidney: Secondary | ICD-10-CM | POA: Diagnosis not present

## 2021-01-16 DIAGNOSIS — E1151 Type 2 diabetes mellitus with diabetic peripheral angiopathy without gangrene: Secondary | ICD-10-CM | POA: Diagnosis not present

## 2021-01-16 DIAGNOSIS — E7800 Pure hypercholesterolemia, unspecified: Secondary | ICD-10-CM | POA: Diagnosis not present

## 2021-01-16 DIAGNOSIS — R1312 Dysphagia, oropharyngeal phase: Secondary | ICD-10-CM | POA: Diagnosis not present

## 2021-01-16 DIAGNOSIS — G252 Other specified forms of tremor: Secondary | ICD-10-CM | POA: Diagnosis not present

## 2021-01-16 DIAGNOSIS — I255 Ischemic cardiomyopathy: Secondary | ICD-10-CM | POA: Diagnosis not present

## 2021-01-16 DIAGNOSIS — M353 Polymyalgia rheumatica: Secondary | ICD-10-CM | POA: Diagnosis not present

## 2021-01-16 DIAGNOSIS — E46 Unspecified protein-calorie malnutrition: Secondary | ICD-10-CM | POA: Diagnosis not present

## 2021-01-16 DIAGNOSIS — I2583 Coronary atherosclerosis due to lipid rich plaque: Secondary | ICD-10-CM | POA: Diagnosis not present

## 2021-01-16 DIAGNOSIS — M800AXG Age-related osteoporosis with current pathological fracture, other site, subsequent encounter for fracture with delayed healing: Secondary | ICD-10-CM | POA: Diagnosis not present

## 2021-01-16 DIAGNOSIS — M419 Scoliosis, unspecified: Secondary | ICD-10-CM | POA: Diagnosis not present

## 2021-01-16 DIAGNOSIS — J9611 Chronic respiratory failure with hypoxia: Secondary | ICD-10-CM | POA: Diagnosis not present

## 2021-01-16 DIAGNOSIS — J432 Centrilobular emphysema: Secondary | ICD-10-CM | POA: Diagnosis not present

## 2021-01-16 DIAGNOSIS — N1832 Chronic kidney disease, stage 3b: Secondary | ICD-10-CM | POA: Diagnosis not present

## 2021-01-16 DIAGNOSIS — I480 Paroxysmal atrial fibrillation: Secondary | ICD-10-CM | POA: Diagnosis not present

## 2021-01-16 DIAGNOSIS — I130 Hypertensive heart and chronic kidney disease with heart failure and stage 1 through stage 4 chronic kidney disease, or unspecified chronic kidney disease: Secondary | ICD-10-CM | POA: Diagnosis not present

## 2021-01-16 DIAGNOSIS — E1122 Type 2 diabetes mellitus with diabetic chronic kidney disease: Secondary | ICD-10-CM | POA: Diagnosis not present

## 2021-01-16 DIAGNOSIS — F32A Depression, unspecified: Secondary | ICD-10-CM | POA: Diagnosis not present

## 2021-01-16 DIAGNOSIS — M1990 Unspecified osteoarthritis, unspecified site: Secondary | ICD-10-CM | POA: Diagnosis not present

## 2021-01-16 DIAGNOSIS — I509 Heart failure, unspecified: Secondary | ICD-10-CM | POA: Diagnosis not present

## 2021-01-16 DIAGNOSIS — M8008XD Age-related osteoporosis with current pathological fracture, vertebra(e), subsequent encounter for fracture with routine healing: Secondary | ICD-10-CM | POA: Diagnosis not present

## 2021-01-16 DIAGNOSIS — G2581 Restless legs syndrome: Secondary | ICD-10-CM | POA: Diagnosis not present

## 2021-01-16 DIAGNOSIS — D631 Anemia in chronic kidney disease: Secondary | ICD-10-CM | POA: Diagnosis not present

## 2021-01-16 DIAGNOSIS — I2510 Atherosclerotic heart disease of native coronary artery without angina pectoris: Secondary | ICD-10-CM | POA: Diagnosis not present

## 2021-01-18 DIAGNOSIS — I509 Heart failure, unspecified: Secondary | ICD-10-CM | POA: Diagnosis not present

## 2021-01-18 DIAGNOSIS — I700 Atherosclerosis of aorta: Secondary | ICD-10-CM | POA: Diagnosis not present

## 2021-01-18 DIAGNOSIS — Z950 Presence of cardiac pacemaker: Secondary | ICD-10-CM | POA: Diagnosis not present

## 2021-01-18 DIAGNOSIS — R531 Weakness: Secondary | ICD-10-CM | POA: Diagnosis not present

## 2021-01-18 DIAGNOSIS — F17210 Nicotine dependence, cigarettes, uncomplicated: Secondary | ICD-10-CM | POA: Diagnosis not present

## 2021-01-18 DIAGNOSIS — J9811 Atelectasis: Secondary | ICD-10-CM | POA: Diagnosis not present

## 2021-01-18 DIAGNOSIS — Z8669 Personal history of other diseases of the nervous system and sense organs: Secondary | ICD-10-CM | POA: Diagnosis not present

## 2021-01-18 DIAGNOSIS — Z7952 Long term (current) use of systemic steroids: Secondary | ICD-10-CM | POA: Diagnosis not present

## 2021-01-18 DIAGNOSIS — Z7982 Long term (current) use of aspirin: Secondary | ICD-10-CM | POA: Diagnosis not present

## 2021-01-18 DIAGNOSIS — Z743 Need for continuous supervision: Secondary | ICD-10-CM | POA: Diagnosis not present

## 2021-01-18 DIAGNOSIS — E119 Type 2 diabetes mellitus without complications: Secondary | ICD-10-CM | POA: Diagnosis not present

## 2021-01-18 DIAGNOSIS — R0602 Shortness of breath: Secondary | ICD-10-CM | POA: Diagnosis not present

## 2021-01-18 DIAGNOSIS — Z883 Allergy status to other anti-infective agents status: Secondary | ICD-10-CM | POA: Diagnosis not present

## 2021-01-18 DIAGNOSIS — Z79899 Other long term (current) drug therapy: Secondary | ICD-10-CM | POA: Diagnosis not present

## 2021-01-18 DIAGNOSIS — J441 Chronic obstructive pulmonary disease with (acute) exacerbation: Secondary | ICD-10-CM | POA: Diagnosis not present

## 2021-01-18 DIAGNOSIS — Z7902 Long term (current) use of antithrombotics/antiplatelets: Secondary | ICD-10-CM | POA: Diagnosis not present

## 2021-01-18 DIAGNOSIS — Z95810 Presence of automatic (implantable) cardiac defibrillator: Secondary | ICD-10-CM | POA: Diagnosis not present

## 2021-01-18 DIAGNOSIS — I739 Peripheral vascular disease, unspecified: Secondary | ICD-10-CM | POA: Diagnosis not present

## 2021-01-18 DIAGNOSIS — Z888 Allergy status to other drugs, medicaments and biological substances status: Secondary | ICD-10-CM | POA: Diagnosis not present

## 2021-01-18 DIAGNOSIS — Z886 Allergy status to analgesic agent status: Secondary | ICD-10-CM | POA: Diagnosis not present

## 2021-01-18 DIAGNOSIS — R600 Localized edema: Secondary | ICD-10-CM | POA: Diagnosis not present

## 2021-01-18 DIAGNOSIS — I480 Paroxysmal atrial fibrillation: Secondary | ICD-10-CM | POA: Diagnosis not present

## 2021-01-18 DIAGNOSIS — Z955 Presence of coronary angioplasty implant and graft: Secondary | ICD-10-CM | POA: Diagnosis not present

## 2021-01-18 DIAGNOSIS — I110 Hypertensive heart disease with heart failure: Secondary | ICD-10-CM | POA: Diagnosis not present

## 2021-01-20 DIAGNOSIS — J449 Chronic obstructive pulmonary disease, unspecified: Secondary | ICD-10-CM | POA: Diagnosis not present

## 2021-01-20 DIAGNOSIS — K219 Gastro-esophageal reflux disease without esophagitis: Secondary | ICD-10-CM | POA: Diagnosis not present

## 2021-01-20 DIAGNOSIS — J441 Chronic obstructive pulmonary disease with (acute) exacerbation: Secondary | ICD-10-CM | POA: Diagnosis not present

## 2021-01-20 DIAGNOSIS — I509 Heart failure, unspecified: Secondary | ICD-10-CM | POA: Diagnosis not present

## 2021-01-20 DIAGNOSIS — M810 Age-related osteoporosis without current pathological fracture: Secondary | ICD-10-CM | POA: Diagnosis not present

## 2021-01-20 DIAGNOSIS — E119 Type 2 diabetes mellitus without complications: Secondary | ICD-10-CM | POA: Diagnosis not present

## 2021-01-20 DIAGNOSIS — I10 Essential (primary) hypertension: Secondary | ICD-10-CM | POA: Diagnosis not present

## 2021-01-20 DIAGNOSIS — I480 Paroxysmal atrial fibrillation: Secondary | ICD-10-CM | POA: Diagnosis not present

## 2021-01-20 DIAGNOSIS — I5042 Chronic combined systolic (congestive) and diastolic (congestive) heart failure: Secondary | ICD-10-CM | POA: Diagnosis not present

## 2021-01-20 DIAGNOSIS — I2510 Atherosclerotic heart disease of native coronary artery without angina pectoris: Secondary | ICD-10-CM | POA: Diagnosis not present

## 2021-01-20 DIAGNOSIS — G8929 Other chronic pain: Secondary | ICD-10-CM | POA: Diagnosis not present

## 2021-01-20 DIAGNOSIS — E1140 Type 2 diabetes mellitus with diabetic neuropathy, unspecified: Secondary | ICD-10-CM | POA: Diagnosis not present

## 2021-01-22 DIAGNOSIS — M8008XA Age-related osteoporosis with current pathological fracture, vertebra(e), initial encounter for fracture: Secondary | ICD-10-CM | POA: Diagnosis not present

## 2021-01-22 DIAGNOSIS — M545 Low back pain, unspecified: Secondary | ICD-10-CM | POA: Diagnosis not present

## 2021-01-23 DIAGNOSIS — N200 Calculus of kidney: Secondary | ICD-10-CM | POA: Diagnosis not present

## 2021-01-23 DIAGNOSIS — M800AXG Age-related osteoporosis with current pathological fracture, other site, subsequent encounter for fracture with delayed healing: Secondary | ICD-10-CM | POA: Diagnosis not present

## 2021-01-23 DIAGNOSIS — E1122 Type 2 diabetes mellitus with diabetic chronic kidney disease: Secondary | ICD-10-CM | POA: Diagnosis not present

## 2021-01-23 DIAGNOSIS — E1151 Type 2 diabetes mellitus with diabetic peripheral angiopathy without gangrene: Secondary | ICD-10-CM | POA: Diagnosis not present

## 2021-01-23 DIAGNOSIS — E7800 Pure hypercholesterolemia, unspecified: Secondary | ICD-10-CM | POA: Diagnosis not present

## 2021-01-23 DIAGNOSIS — R609 Edema, unspecified: Secondary | ICD-10-CM | POA: Diagnosis not present

## 2021-01-23 DIAGNOSIS — F32A Depression, unspecified: Secondary | ICD-10-CM | POA: Diagnosis not present

## 2021-01-23 DIAGNOSIS — M353 Polymyalgia rheumatica: Secondary | ICD-10-CM | POA: Diagnosis not present

## 2021-01-23 DIAGNOSIS — D631 Anemia in chronic kidney disease: Secondary | ICD-10-CM | POA: Diagnosis not present

## 2021-01-23 DIAGNOSIS — H6983 Other specified disorders of Eustachian tube, bilateral: Secondary | ICD-10-CM | POA: Diagnosis not present

## 2021-01-23 DIAGNOSIS — G252 Other specified forms of tremor: Secondary | ICD-10-CM | POA: Diagnosis not present

## 2021-01-23 DIAGNOSIS — I480 Paroxysmal atrial fibrillation: Secondary | ICD-10-CM | POA: Diagnosis not present

## 2021-01-23 DIAGNOSIS — I509 Heart failure, unspecified: Secondary | ICD-10-CM | POA: Diagnosis not present

## 2021-01-23 DIAGNOSIS — M1990 Unspecified osteoarthritis, unspecified site: Secondary | ICD-10-CM | POA: Diagnosis not present

## 2021-01-23 DIAGNOSIS — J9611 Chronic respiratory failure with hypoxia: Secondary | ICD-10-CM | POA: Diagnosis not present

## 2021-01-23 DIAGNOSIS — J432 Centrilobular emphysema: Secondary | ICD-10-CM | POA: Diagnosis not present

## 2021-01-23 DIAGNOSIS — G2581 Restless legs syndrome: Secondary | ICD-10-CM | POA: Diagnosis not present

## 2021-01-23 DIAGNOSIS — M8008XD Age-related osteoporosis with current pathological fracture, vertebra(e), subsequent encounter for fracture with routine healing: Secondary | ICD-10-CM | POA: Diagnosis not present

## 2021-01-23 DIAGNOSIS — E46 Unspecified protein-calorie malnutrition: Secondary | ICD-10-CM | POA: Diagnosis not present

## 2021-01-23 DIAGNOSIS — I255 Ischemic cardiomyopathy: Secondary | ICD-10-CM | POA: Diagnosis not present

## 2021-01-23 DIAGNOSIS — I130 Hypertensive heart and chronic kidney disease with heart failure and stage 1 through stage 4 chronic kidney disease, or unspecified chronic kidney disease: Secondary | ICD-10-CM | POA: Diagnosis not present

## 2021-01-23 DIAGNOSIS — I2583 Coronary atherosclerosis due to lipid rich plaque: Secondary | ICD-10-CM | POA: Diagnosis not present

## 2021-01-23 DIAGNOSIS — J3489 Other specified disorders of nose and nasal sinuses: Secondary | ICD-10-CM | POA: Diagnosis not present

## 2021-01-23 DIAGNOSIS — I2510 Atherosclerotic heart disease of native coronary artery without angina pectoris: Secondary | ICD-10-CM | POA: Diagnosis not present

## 2021-01-23 DIAGNOSIS — M419 Scoliosis, unspecified: Secondary | ICD-10-CM | POA: Diagnosis not present

## 2021-01-23 DIAGNOSIS — N1832 Chronic kidney disease, stage 3b: Secondary | ICD-10-CM | POA: Diagnosis not present

## 2021-01-23 DIAGNOSIS — R1312 Dysphagia, oropharyngeal phase: Secondary | ICD-10-CM | POA: Diagnosis not present

## 2021-01-24 DIAGNOSIS — G2581 Restless legs syndrome: Secondary | ICD-10-CM | POA: Diagnosis not present

## 2021-01-24 DIAGNOSIS — N200 Calculus of kidney: Secondary | ICD-10-CM | POA: Diagnosis not present

## 2021-01-24 DIAGNOSIS — I2510 Atherosclerotic heart disease of native coronary artery without angina pectoris: Secondary | ICD-10-CM | POA: Diagnosis not present

## 2021-01-24 DIAGNOSIS — M8008XD Age-related osteoporosis with current pathological fracture, vertebra(e), subsequent encounter for fracture with routine healing: Secondary | ICD-10-CM | POA: Diagnosis not present

## 2021-01-24 DIAGNOSIS — M800AXG Age-related osteoporosis with current pathological fracture, other site, subsequent encounter for fracture with delayed healing: Secondary | ICD-10-CM | POA: Diagnosis not present

## 2021-01-24 DIAGNOSIS — N1832 Chronic kidney disease, stage 3b: Secondary | ICD-10-CM | POA: Diagnosis not present

## 2021-01-24 DIAGNOSIS — M1990 Unspecified osteoarthritis, unspecified site: Secondary | ICD-10-CM | POA: Diagnosis not present

## 2021-01-24 DIAGNOSIS — I480 Paroxysmal atrial fibrillation: Secondary | ICD-10-CM | POA: Diagnosis not present

## 2021-01-24 DIAGNOSIS — I130 Hypertensive heart and chronic kidney disease with heart failure and stage 1 through stage 4 chronic kidney disease, or unspecified chronic kidney disease: Secondary | ICD-10-CM | POA: Diagnosis not present

## 2021-01-24 DIAGNOSIS — J9611 Chronic respiratory failure with hypoxia: Secondary | ICD-10-CM | POA: Diagnosis not present

## 2021-01-24 DIAGNOSIS — E7800 Pure hypercholesterolemia, unspecified: Secondary | ICD-10-CM | POA: Diagnosis not present

## 2021-01-24 DIAGNOSIS — G252 Other specified forms of tremor: Secondary | ICD-10-CM | POA: Diagnosis not present

## 2021-01-24 DIAGNOSIS — E1151 Type 2 diabetes mellitus with diabetic peripheral angiopathy without gangrene: Secondary | ICD-10-CM | POA: Diagnosis not present

## 2021-01-24 DIAGNOSIS — E46 Unspecified protein-calorie malnutrition: Secondary | ICD-10-CM | POA: Diagnosis not present

## 2021-01-24 DIAGNOSIS — I255 Ischemic cardiomyopathy: Secondary | ICD-10-CM | POA: Diagnosis not present

## 2021-01-24 DIAGNOSIS — E1122 Type 2 diabetes mellitus with diabetic chronic kidney disease: Secondary | ICD-10-CM | POA: Diagnosis not present

## 2021-01-24 DIAGNOSIS — I2583 Coronary atherosclerosis due to lipid rich plaque: Secondary | ICD-10-CM | POA: Diagnosis not present

## 2021-01-24 DIAGNOSIS — D631 Anemia in chronic kidney disease: Secondary | ICD-10-CM | POA: Diagnosis not present

## 2021-01-24 DIAGNOSIS — M419 Scoliosis, unspecified: Secondary | ICD-10-CM | POA: Diagnosis not present

## 2021-01-24 DIAGNOSIS — J432 Centrilobular emphysema: Secondary | ICD-10-CM | POA: Diagnosis not present

## 2021-01-24 DIAGNOSIS — F32A Depression, unspecified: Secondary | ICD-10-CM | POA: Diagnosis not present

## 2021-01-24 DIAGNOSIS — I509 Heart failure, unspecified: Secondary | ICD-10-CM | POA: Diagnosis not present

## 2021-01-24 DIAGNOSIS — R1312 Dysphagia, oropharyngeal phase: Secondary | ICD-10-CM | POA: Diagnosis not present

## 2021-01-24 DIAGNOSIS — M353 Polymyalgia rheumatica: Secondary | ICD-10-CM | POA: Diagnosis not present

## 2021-01-27 DIAGNOSIS — D631 Anemia in chronic kidney disease: Secondary | ICD-10-CM | POA: Diagnosis not present

## 2021-01-27 DIAGNOSIS — E7800 Pure hypercholesterolemia, unspecified: Secondary | ICD-10-CM | POA: Diagnosis not present

## 2021-01-27 DIAGNOSIS — E1122 Type 2 diabetes mellitus with diabetic chronic kidney disease: Secondary | ICD-10-CM | POA: Diagnosis not present

## 2021-01-27 DIAGNOSIS — E1151 Type 2 diabetes mellitus with diabetic peripheral angiopathy without gangrene: Secondary | ICD-10-CM | POA: Diagnosis not present

## 2021-01-27 DIAGNOSIS — J9611 Chronic respiratory failure with hypoxia: Secondary | ICD-10-CM | POA: Diagnosis not present

## 2021-01-27 DIAGNOSIS — N200 Calculus of kidney: Secondary | ICD-10-CM | POA: Diagnosis not present

## 2021-01-27 DIAGNOSIS — E46 Unspecified protein-calorie malnutrition: Secondary | ICD-10-CM | POA: Diagnosis not present

## 2021-01-27 DIAGNOSIS — R1312 Dysphagia, oropharyngeal phase: Secondary | ICD-10-CM | POA: Diagnosis not present

## 2021-01-27 DIAGNOSIS — I2510 Atherosclerotic heart disease of native coronary artery without angina pectoris: Secondary | ICD-10-CM | POA: Diagnosis not present

## 2021-01-27 DIAGNOSIS — N1832 Chronic kidney disease, stage 3b: Secondary | ICD-10-CM | POA: Diagnosis not present

## 2021-01-27 DIAGNOSIS — M8008XD Age-related osteoporosis with current pathological fracture, vertebra(e), subsequent encounter for fracture with routine healing: Secondary | ICD-10-CM | POA: Diagnosis not present

## 2021-01-27 DIAGNOSIS — G252 Other specified forms of tremor: Secondary | ICD-10-CM | POA: Diagnosis not present

## 2021-01-27 DIAGNOSIS — I2583 Coronary atherosclerosis due to lipid rich plaque: Secondary | ICD-10-CM | POA: Diagnosis not present

## 2021-01-27 DIAGNOSIS — I480 Paroxysmal atrial fibrillation: Secondary | ICD-10-CM | POA: Diagnosis not present

## 2021-01-27 DIAGNOSIS — I130 Hypertensive heart and chronic kidney disease with heart failure and stage 1 through stage 4 chronic kidney disease, or unspecified chronic kidney disease: Secondary | ICD-10-CM | POA: Diagnosis not present

## 2021-01-27 DIAGNOSIS — M353 Polymyalgia rheumatica: Secondary | ICD-10-CM | POA: Diagnosis not present

## 2021-01-27 DIAGNOSIS — M419 Scoliosis, unspecified: Secondary | ICD-10-CM | POA: Diagnosis not present

## 2021-01-27 DIAGNOSIS — M800AXG Age-related osteoporosis with current pathological fracture, other site, subsequent encounter for fracture with delayed healing: Secondary | ICD-10-CM | POA: Diagnosis not present

## 2021-01-27 DIAGNOSIS — I509 Heart failure, unspecified: Secondary | ICD-10-CM | POA: Diagnosis not present

## 2021-01-27 DIAGNOSIS — I255 Ischemic cardiomyopathy: Secondary | ICD-10-CM | POA: Diagnosis not present

## 2021-01-27 DIAGNOSIS — F32A Depression, unspecified: Secondary | ICD-10-CM | POA: Diagnosis not present

## 2021-01-27 DIAGNOSIS — G2581 Restless legs syndrome: Secondary | ICD-10-CM | POA: Diagnosis not present

## 2021-01-27 DIAGNOSIS — J432 Centrilobular emphysema: Secondary | ICD-10-CM | POA: Diagnosis not present

## 2021-01-27 DIAGNOSIS — M1990 Unspecified osteoarthritis, unspecified site: Secondary | ICD-10-CM | POA: Diagnosis not present

## 2021-01-28 DIAGNOSIS — J9611 Chronic respiratory failure with hypoxia: Secondary | ICD-10-CM | POA: Diagnosis not present

## 2021-01-28 DIAGNOSIS — S2242XG Multiple fractures of ribs, left side, subsequent encounter for fracture with delayed healing: Secondary | ICD-10-CM | POA: Diagnosis not present

## 2021-01-28 DIAGNOSIS — J449 Chronic obstructive pulmonary disease, unspecified: Secondary | ICD-10-CM | POA: Diagnosis not present

## 2021-01-28 DIAGNOSIS — N1832 Chronic kidney disease, stage 3b: Secondary | ICD-10-CM | POA: Diagnosis not present

## 2021-02-02 DIAGNOSIS — F32A Depression, unspecified: Secondary | ICD-10-CM | POA: Diagnosis not present

## 2021-02-02 DIAGNOSIS — I480 Paroxysmal atrial fibrillation: Secondary | ICD-10-CM | POA: Diagnosis not present

## 2021-02-02 DIAGNOSIS — M419 Scoliosis, unspecified: Secondary | ICD-10-CM | POA: Diagnosis not present

## 2021-02-02 DIAGNOSIS — J432 Centrilobular emphysema: Secondary | ICD-10-CM | POA: Diagnosis not present

## 2021-02-02 DIAGNOSIS — I255 Ischemic cardiomyopathy: Secondary | ICD-10-CM | POA: Diagnosis not present

## 2021-02-02 DIAGNOSIS — J449 Chronic obstructive pulmonary disease, unspecified: Secondary | ICD-10-CM | POA: Diagnosis not present

## 2021-02-02 DIAGNOSIS — I130 Hypertensive heart and chronic kidney disease with heart failure and stage 1 through stage 4 chronic kidney disease, or unspecified chronic kidney disease: Secondary | ICD-10-CM | POA: Diagnosis not present

## 2021-02-02 DIAGNOSIS — M8008XD Age-related osteoporosis with current pathological fracture, vertebra(e), subsequent encounter for fracture with routine healing: Secondary | ICD-10-CM | POA: Diagnosis not present

## 2021-02-02 DIAGNOSIS — M1990 Unspecified osteoarthritis, unspecified site: Secondary | ICD-10-CM | POA: Diagnosis not present

## 2021-02-02 DIAGNOSIS — R1312 Dysphagia, oropharyngeal phase: Secondary | ICD-10-CM | POA: Diagnosis not present

## 2021-02-02 DIAGNOSIS — M800AXG Age-related osteoporosis with current pathological fracture, other site, subsequent encounter for fracture with delayed healing: Secondary | ICD-10-CM | POA: Diagnosis not present

## 2021-02-02 DIAGNOSIS — I2510 Atherosclerotic heart disease of native coronary artery without angina pectoris: Secondary | ICD-10-CM | POA: Diagnosis not present

## 2021-02-02 DIAGNOSIS — J9611 Chronic respiratory failure with hypoxia: Secondary | ICD-10-CM | POA: Diagnosis not present

## 2021-02-02 DIAGNOSIS — M353 Polymyalgia rheumatica: Secondary | ICD-10-CM | POA: Diagnosis not present

## 2021-02-02 DIAGNOSIS — I509 Heart failure, unspecified: Secondary | ICD-10-CM | POA: Diagnosis not present

## 2021-02-02 DIAGNOSIS — G252 Other specified forms of tremor: Secondary | ICD-10-CM | POA: Diagnosis not present

## 2021-02-02 DIAGNOSIS — E1122 Type 2 diabetes mellitus with diabetic chronic kidney disease: Secondary | ICD-10-CM | POA: Diagnosis not present

## 2021-02-02 DIAGNOSIS — E1151 Type 2 diabetes mellitus with diabetic peripheral angiopathy without gangrene: Secondary | ICD-10-CM | POA: Diagnosis not present

## 2021-02-02 DIAGNOSIS — E46 Unspecified protein-calorie malnutrition: Secondary | ICD-10-CM | POA: Diagnosis not present

## 2021-02-02 DIAGNOSIS — I2583 Coronary atherosclerosis due to lipid rich plaque: Secondary | ICD-10-CM | POA: Diagnosis not present

## 2021-02-02 DIAGNOSIS — D631 Anemia in chronic kidney disease: Secondary | ICD-10-CM | POA: Diagnosis not present

## 2021-02-02 DIAGNOSIS — E7800 Pure hypercholesterolemia, unspecified: Secondary | ICD-10-CM | POA: Diagnosis not present

## 2021-02-02 DIAGNOSIS — N1832 Chronic kidney disease, stage 3b: Secondary | ICD-10-CM | POA: Diagnosis not present

## 2021-02-02 DIAGNOSIS — G2581 Restless legs syndrome: Secondary | ICD-10-CM | POA: Diagnosis not present

## 2021-02-02 DIAGNOSIS — N200 Calculus of kidney: Secondary | ICD-10-CM | POA: Diagnosis not present

## 2021-02-03 ENCOUNTER — Telehealth: Payer: Self-pay | Admitting: Cardiovascular Disease

## 2021-02-03 DIAGNOSIS — G2581 Restless legs syndrome: Secondary | ICD-10-CM | POA: Diagnosis not present

## 2021-02-03 DIAGNOSIS — I255 Ischemic cardiomyopathy: Secondary | ICD-10-CM | POA: Diagnosis not present

## 2021-02-03 DIAGNOSIS — M419 Scoliosis, unspecified: Secondary | ICD-10-CM | POA: Diagnosis not present

## 2021-02-03 DIAGNOSIS — G252 Other specified forms of tremor: Secondary | ICD-10-CM | POA: Diagnosis not present

## 2021-02-03 DIAGNOSIS — I130 Hypertensive heart and chronic kidney disease with heart failure and stage 1 through stage 4 chronic kidney disease, or unspecified chronic kidney disease: Secondary | ICD-10-CM | POA: Diagnosis not present

## 2021-02-03 DIAGNOSIS — M353 Polymyalgia rheumatica: Secondary | ICD-10-CM | POA: Diagnosis not present

## 2021-02-03 DIAGNOSIS — I509 Heart failure, unspecified: Secondary | ICD-10-CM | POA: Diagnosis not present

## 2021-02-03 DIAGNOSIS — I2583 Coronary atherosclerosis due to lipid rich plaque: Secondary | ICD-10-CM | POA: Diagnosis not present

## 2021-02-03 DIAGNOSIS — E1122 Type 2 diabetes mellitus with diabetic chronic kidney disease: Secondary | ICD-10-CM | POA: Diagnosis not present

## 2021-02-03 DIAGNOSIS — E46 Unspecified protein-calorie malnutrition: Secondary | ICD-10-CM | POA: Diagnosis not present

## 2021-02-03 DIAGNOSIS — J9611 Chronic respiratory failure with hypoxia: Secondary | ICD-10-CM | POA: Diagnosis not present

## 2021-02-03 DIAGNOSIS — I2510 Atherosclerotic heart disease of native coronary artery without angina pectoris: Secondary | ICD-10-CM | POA: Diagnosis not present

## 2021-02-03 DIAGNOSIS — M8008XD Age-related osteoporosis with current pathological fracture, vertebra(e), subsequent encounter for fracture with routine healing: Secondary | ICD-10-CM | POA: Diagnosis not present

## 2021-02-03 DIAGNOSIS — N1832 Chronic kidney disease, stage 3b: Secondary | ICD-10-CM | POA: Diagnosis not present

## 2021-02-03 DIAGNOSIS — D631 Anemia in chronic kidney disease: Secondary | ICD-10-CM | POA: Diagnosis not present

## 2021-02-03 DIAGNOSIS — M800AXG Age-related osteoporosis with current pathological fracture, other site, subsequent encounter for fracture with delayed healing: Secondary | ICD-10-CM | POA: Diagnosis not present

## 2021-02-03 DIAGNOSIS — F32A Depression, unspecified: Secondary | ICD-10-CM | POA: Diagnosis not present

## 2021-02-03 DIAGNOSIS — R1312 Dysphagia, oropharyngeal phase: Secondary | ICD-10-CM | POA: Diagnosis not present

## 2021-02-03 DIAGNOSIS — J432 Centrilobular emphysema: Secondary | ICD-10-CM | POA: Diagnosis not present

## 2021-02-03 DIAGNOSIS — E1151 Type 2 diabetes mellitus with diabetic peripheral angiopathy without gangrene: Secondary | ICD-10-CM | POA: Diagnosis not present

## 2021-02-03 DIAGNOSIS — M1990 Unspecified osteoarthritis, unspecified site: Secondary | ICD-10-CM | POA: Diagnosis not present

## 2021-02-03 DIAGNOSIS — E7800 Pure hypercholesterolemia, unspecified: Secondary | ICD-10-CM | POA: Diagnosis not present

## 2021-02-03 DIAGNOSIS — N200 Calculus of kidney: Secondary | ICD-10-CM | POA: Diagnosis not present

## 2021-02-03 DIAGNOSIS — I480 Paroxysmal atrial fibrillation: Secondary | ICD-10-CM | POA: Diagnosis not present

## 2021-02-03 NOTE — Telephone Encounter (Signed)
Called patient left message on personal voice mail to call back. 

## 2021-02-03 NOTE — Telephone Encounter (Signed)
Needs to be evaluated by APP sometime in the next several weeks.

## 2021-02-03 NOTE — Telephone Encounter (Signed)
Pt c/o swelling: STAT is pt has developed SOB within 24 hours  1. If swelling, where is the swelling located? Feet are huge 2.  3. How much weight have you gained and in what time span?    Did not know if she had gained weight  4. Have you gained 3 pounds in a day or 5 pounds in a week? Not sure  5. Do you have a log of your daily weights (if so, list)? no  6. Are you currently taking a fluid pill? Yes- Torsemide- have increased  To 40 mg in the last few days- wants to know if she can rake another pill- swelling is not going down  7. Are you currently SOB?no  8. Have you traveled recently? no
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
## 2021-02-04 NOTE — Telephone Encounter (Signed)
Spoke to patient Dr.Berry's advice given.Appointment scheduled with Coletta Memos NP 7/20 at 2:15 pm.

## 2021-02-04 NOTE — Telephone Encounter (Signed)
Follow Up:      Pt is returning call from yesterday.

## 2021-02-05 DIAGNOSIS — K921 Melena: Secondary | ICD-10-CM | POA: Diagnosis not present

## 2021-02-05 DIAGNOSIS — K3182 Dieulafoy lesion (hemorrhagic) of stomach and duodenum: Secondary | ICD-10-CM | POA: Diagnosis not present

## 2021-02-05 DIAGNOSIS — D649 Anemia, unspecified: Secondary | ICD-10-CM | POA: Diagnosis not present

## 2021-02-05 DIAGNOSIS — K219 Gastro-esophageal reflux disease without esophagitis: Secondary | ICD-10-CM | POA: Diagnosis not present

## 2021-02-05 DIAGNOSIS — Z86010 Personal history of colonic polyps: Secondary | ICD-10-CM | POA: Diagnosis not present

## 2021-02-09 DIAGNOSIS — I255 Ischemic cardiomyopathy: Secondary | ICD-10-CM | POA: Diagnosis not present

## 2021-02-09 DIAGNOSIS — M1990 Unspecified osteoarthritis, unspecified site: Secondary | ICD-10-CM | POA: Diagnosis not present

## 2021-02-09 DIAGNOSIS — J432 Centrilobular emphysema: Secondary | ICD-10-CM | POA: Diagnosis not present

## 2021-02-09 DIAGNOSIS — R1312 Dysphagia, oropharyngeal phase: Secondary | ICD-10-CM | POA: Diagnosis not present

## 2021-02-09 DIAGNOSIS — J9611 Chronic respiratory failure with hypoxia: Secondary | ICD-10-CM | POA: Diagnosis not present

## 2021-02-09 DIAGNOSIS — I2583 Coronary atherosclerosis due to lipid rich plaque: Secondary | ICD-10-CM | POA: Diagnosis not present

## 2021-02-09 DIAGNOSIS — N200 Calculus of kidney: Secondary | ICD-10-CM | POA: Diagnosis not present

## 2021-02-09 DIAGNOSIS — I480 Paroxysmal atrial fibrillation: Secondary | ICD-10-CM | POA: Diagnosis not present

## 2021-02-09 DIAGNOSIS — E1151 Type 2 diabetes mellitus with diabetic peripheral angiopathy without gangrene: Secondary | ICD-10-CM | POA: Diagnosis not present

## 2021-02-09 DIAGNOSIS — E7800 Pure hypercholesterolemia, unspecified: Secondary | ICD-10-CM | POA: Diagnosis not present

## 2021-02-09 DIAGNOSIS — M353 Polymyalgia rheumatica: Secondary | ICD-10-CM | POA: Diagnosis not present

## 2021-02-09 DIAGNOSIS — I2510 Atherosclerotic heart disease of native coronary artery without angina pectoris: Secondary | ICD-10-CM | POA: Diagnosis not present

## 2021-02-09 DIAGNOSIS — E1122 Type 2 diabetes mellitus with diabetic chronic kidney disease: Secondary | ICD-10-CM | POA: Diagnosis not present

## 2021-02-09 DIAGNOSIS — M800AXG Age-related osteoporosis with current pathological fracture, other site, subsequent encounter for fracture with delayed healing: Secondary | ICD-10-CM | POA: Diagnosis not present

## 2021-02-09 DIAGNOSIS — M419 Scoliosis, unspecified: Secondary | ICD-10-CM | POA: Diagnosis not present

## 2021-02-09 DIAGNOSIS — I509 Heart failure, unspecified: Secondary | ICD-10-CM | POA: Diagnosis not present

## 2021-02-09 DIAGNOSIS — G252 Other specified forms of tremor: Secondary | ICD-10-CM | POA: Diagnosis not present

## 2021-02-09 DIAGNOSIS — I130 Hypertensive heart and chronic kidney disease with heart failure and stage 1 through stage 4 chronic kidney disease, or unspecified chronic kidney disease: Secondary | ICD-10-CM | POA: Diagnosis not present

## 2021-02-09 DIAGNOSIS — E46 Unspecified protein-calorie malnutrition: Secondary | ICD-10-CM | POA: Diagnosis not present

## 2021-02-09 DIAGNOSIS — D631 Anemia in chronic kidney disease: Secondary | ICD-10-CM | POA: Diagnosis not present

## 2021-02-09 DIAGNOSIS — F32A Depression, unspecified: Secondary | ICD-10-CM | POA: Diagnosis not present

## 2021-02-09 DIAGNOSIS — M8008XD Age-related osteoporosis with current pathological fracture, vertebra(e), subsequent encounter for fracture with routine healing: Secondary | ICD-10-CM | POA: Diagnosis not present

## 2021-02-09 DIAGNOSIS — G2581 Restless legs syndrome: Secondary | ICD-10-CM | POA: Diagnosis not present

## 2021-02-09 DIAGNOSIS — N1832 Chronic kidney disease, stage 3b: Secondary | ICD-10-CM | POA: Diagnosis not present

## 2021-02-10 ENCOUNTER — Telehealth: Payer: Self-pay | Admitting: Cardiovascular Disease

## 2021-02-10 NOTE — Telephone Encounter (Signed)
   Name: Kimberly Downs  DOB: Jan 25, 1946  MRN: [id number]  Primary Cardiologist: [name], MD  Chart reviewed as part of pre-operative protocol coverage. Because of [name] past medical history and time since last visit, she will require a follow-up visit in order to better assess preoperative cardiovascular risk.  She recently called the office with c/o extensive LE edema with recommendations from Dr. Gwenlyn Found for the patient to be seen by APP for follow up. This is scheduled for 03/18/21.   Pre-op covering staff: - Pease add "pre-op clearance" to the appointment notes so provider is aware. - Please contact requesting surgeon's office via preferred method (i.e, phone, fax) to inform them of need for appointment prior to surgery.  If applicable, this message will also be routed to pharmacy pool and/or primary cardiologist for input on holding anticoagulant/antiplatelet agent as requested below so that this information is available to the clearing provider at time of patient's appointment.   Kathyrn Drown, NP  02/10/2021, 4:22 PM

## 2021-02-10 NOTE — Telephone Encounter (Signed)
I have added pre op clearance needed to appt notes for Coletta Memos, FNP appt 03/18/21. I will fax notes to requesting office the pt has appt 03/18/21 which at that time will be assessed for pre op clearance as well. Unfortunately there are no available openings any sooner. Pt's procedure will need to be postponed until cleared by cardiology.

## 2021-02-10 NOTE — Telephone Encounter (Signed)
   [facility] Pre-operative Risk Assessment    Patient Name: Kimberly Downs  DOB: Oct 28, 1945  MRN: [id number]    Request for surgical clearance:  What type of surgery is being performed? EGD  When is this surgery scheduled? 02/12/2021   What type of clearance is required (medical clearance vs. Pharmacy clearance to hold med vs. Both)? Medical (pacemaker)  Are there any medications that need to be held prior to surgery and how long? None specified   Practice name and name of physician performing surgery? Dr. Cristela Blue with [facility]   What is the office phone number? [phone]   7.   What is the office fax number? [phone]  8.   Anesthesia type (None, local, MAC, general) ? Not specified    Fidel Levy 02/10/2021, 3:28 PM  _________________________________________________________________   (provider comments below)

## 2021-02-18 DIAGNOSIS — I2510 Atherosclerotic heart disease of native coronary artery without angina pectoris: Secondary | ICD-10-CM | POA: Diagnosis not present

## 2021-02-18 DIAGNOSIS — N1832 Chronic kidney disease, stage 3b: Secondary | ICD-10-CM | POA: Diagnosis not present

## 2021-02-18 DIAGNOSIS — M419 Scoliosis, unspecified: Secondary | ICD-10-CM | POA: Diagnosis not present

## 2021-02-18 DIAGNOSIS — N200 Calculus of kidney: Secondary | ICD-10-CM | POA: Diagnosis not present

## 2021-02-18 DIAGNOSIS — M353 Polymyalgia rheumatica: Secondary | ICD-10-CM | POA: Diagnosis not present

## 2021-02-18 DIAGNOSIS — I255 Ischemic cardiomyopathy: Secondary | ICD-10-CM | POA: Diagnosis not present

## 2021-02-18 DIAGNOSIS — F32A Depression, unspecified: Secondary | ICD-10-CM | POA: Diagnosis not present

## 2021-02-18 DIAGNOSIS — M800AXG Age-related osteoporosis with current pathological fracture, other site, subsequent encounter for fracture with delayed healing: Secondary | ICD-10-CM | POA: Diagnosis not present

## 2021-02-18 DIAGNOSIS — G252 Other specified forms of tremor: Secondary | ICD-10-CM | POA: Diagnosis not present

## 2021-02-18 DIAGNOSIS — E1151 Type 2 diabetes mellitus with diabetic peripheral angiopathy without gangrene: Secondary | ICD-10-CM | POA: Diagnosis not present

## 2021-02-18 DIAGNOSIS — R1312 Dysphagia, oropharyngeal phase: Secondary | ICD-10-CM | POA: Diagnosis not present

## 2021-02-18 DIAGNOSIS — E7800 Pure hypercholesterolemia, unspecified: Secondary | ICD-10-CM | POA: Diagnosis not present

## 2021-02-18 DIAGNOSIS — I509 Heart failure, unspecified: Secondary | ICD-10-CM | POA: Diagnosis not present

## 2021-02-18 DIAGNOSIS — G2581 Restless legs syndrome: Secondary | ICD-10-CM | POA: Diagnosis not present

## 2021-02-18 DIAGNOSIS — I2583 Coronary atherosclerosis due to lipid rich plaque: Secondary | ICD-10-CM | POA: Diagnosis not present

## 2021-02-18 DIAGNOSIS — J432 Centrilobular emphysema: Secondary | ICD-10-CM | POA: Diagnosis not present

## 2021-02-18 DIAGNOSIS — J9611 Chronic respiratory failure with hypoxia: Secondary | ICD-10-CM | POA: Diagnosis not present

## 2021-02-18 DIAGNOSIS — E1122 Type 2 diabetes mellitus with diabetic chronic kidney disease: Secondary | ICD-10-CM | POA: Diagnosis not present

## 2021-02-18 DIAGNOSIS — M1990 Unspecified osteoarthritis, unspecified site: Secondary | ICD-10-CM | POA: Diagnosis not present

## 2021-02-18 DIAGNOSIS — I480 Paroxysmal atrial fibrillation: Secondary | ICD-10-CM | POA: Diagnosis not present

## 2021-02-18 DIAGNOSIS — M8008XD Age-related osteoporosis with current pathological fracture, vertebra(e), subsequent encounter for fracture with routine healing: Secondary | ICD-10-CM | POA: Diagnosis not present

## 2021-02-18 DIAGNOSIS — E46 Unspecified protein-calorie malnutrition: Secondary | ICD-10-CM | POA: Diagnosis not present

## 2021-02-18 DIAGNOSIS — D631 Anemia in chronic kidney disease: Secondary | ICD-10-CM | POA: Diagnosis not present

## 2021-02-18 DIAGNOSIS — I130 Hypertensive heart and chronic kidney disease with heart failure and stage 1 through stage 4 chronic kidney disease, or unspecified chronic kidney disease: Secondary | ICD-10-CM | POA: Diagnosis not present

## 2021-02-20 NOTE — Telephone Encounter (Signed)
Dr. Sallyanne Kuster,  Please provide recommendations for patient's PPM prior to endoscopy.  Thank you.  Jossie Ng. Kerby Borner NP-C    02/20/2021, 8:38 AM [address] Office [phone] Fax [phone]

## 2021-02-23 ENCOUNTER — Other Ambulatory Visit: Payer: Self-pay | Admitting: Family Medicine

## 2021-02-23 DIAGNOSIS — M810 Age-related osteoporosis without current pathological fracture: Secondary | ICD-10-CM

## 2021-02-23 DIAGNOSIS — S2249XA Multiple fractures of ribs, unspecified side, initial encounter for closed fracture: Secondary | ICD-10-CM

## 2021-02-24 NOTE — Telephone Encounter (Signed)
There are no particular precautions prior to EGD for her device.

## 2021-02-27 ENCOUNTER — Ambulatory Visit (INDEPENDENT_AMBULATORY_CARE_PROVIDER_SITE_OTHER): Payer: Medicare Other

## 2021-02-27 DIAGNOSIS — N1832 Chronic kidney disease, stage 3b: Secondary | ICD-10-CM | POA: Diagnosis not present

## 2021-02-27 DIAGNOSIS — I428 Other cardiomyopathies: Secondary | ICD-10-CM | POA: Diagnosis not present

## 2021-02-27 DIAGNOSIS — J449 Chronic obstructive pulmonary disease, unspecified: Secondary | ICD-10-CM | POA: Diagnosis not present

## 2021-02-27 DIAGNOSIS — S2242XG Multiple fractures of ribs, left side, subsequent encounter for fracture with delayed healing: Secondary | ICD-10-CM | POA: Diagnosis not present

## 2021-02-27 DIAGNOSIS — J9611 Chronic respiratory failure with hypoxia: Secondary | ICD-10-CM | POA: Diagnosis not present

## 2021-02-27 LAB — CUP PACEART REMOTE DEVICE CHECK
Battery Remaining Longevity: 111 mo
Battery Voltage: 2.95 V
Brady Statistic RV Percent Paced: 0.05 %
Date Time Interrogation Session: 20220701042406
HighPow Impedance: 65 Ohm
Implantable Lead Implant Date: 20181128
Implantable Lead Location: 753860
Implantable Pulse Generator Implant Date: 20181128
Lead Channel Impedance Value: 247 Ohm
Lead Channel Impedance Value: 361 Ohm
Lead Channel Pacing Threshold Amplitude: 2.125 V
Lead Channel Pacing Threshold Pulse Width: 0.4 ms
Lead Channel Sensing Intrinsic Amplitude: 9.875 mV
Lead Channel Sensing Intrinsic Amplitude: 9.875 mV
Lead Channel Setting Pacing Amplitude: 4.25 V
Lead Channel Setting Pacing Pulse Width: 0.4 ms
Lead Channel Setting Sensing Sensitivity: 0.3 mV

## 2021-03-04 ENCOUNTER — Other Ambulatory Visit: Payer: Self-pay

## 2021-03-04 ENCOUNTER — Ambulatory Visit (INDEPENDENT_AMBULATORY_CARE_PROVIDER_SITE_OTHER): Payer: Medicare Other

## 2021-03-04 DIAGNOSIS — M810 Age-related osteoporosis without current pathological fracture: Secondary | ICD-10-CM | POA: Diagnosis not present

## 2021-03-04 DIAGNOSIS — S2249XA Multiple fractures of ribs, unspecified side, initial encounter for closed fracture: Secondary | ICD-10-CM

## 2021-03-04 DIAGNOSIS — Z780 Asymptomatic menopausal state: Secondary | ICD-10-CM | POA: Diagnosis not present

## 2021-03-04 DIAGNOSIS — J449 Chronic obstructive pulmonary disease, unspecified: Secondary | ICD-10-CM | POA: Diagnosis not present

## 2021-03-18 ENCOUNTER — Ambulatory Visit: Payer: Medicare Other | Admitting: General Practice

## 2021-03-18 NOTE — Progress Notes (Signed)
Remote ICD transmission.   

## 2021-03-29 DIAGNOSIS — I480 Paroxysmal atrial fibrillation: Secondary | ICD-10-CM | POA: Diagnosis not present

## 2021-03-29 DIAGNOSIS — E119 Type 2 diabetes mellitus without complications: Secondary | ICD-10-CM | POA: Diagnosis not present

## 2021-03-29 DIAGNOSIS — I119 Hypertensive heart disease without heart failure: Secondary | ICD-10-CM | POA: Diagnosis not present

## 2021-03-29 DIAGNOSIS — I10 Essential (primary) hypertension: Secondary | ICD-10-CM | POA: Diagnosis not present

## 2021-03-29 DIAGNOSIS — I5042 Chronic combined systolic (congestive) and diastolic (congestive) heart failure: Secondary | ICD-10-CM | POA: Diagnosis not present

## 2021-03-29 DIAGNOSIS — J449 Chronic obstructive pulmonary disease, unspecified: Secondary | ICD-10-CM | POA: Diagnosis not present

## 2021-03-29 DIAGNOSIS — I2510 Atherosclerotic heart disease of native coronary artery without angina pectoris: Secondary | ICD-10-CM | POA: Diagnosis not present

## 2021-03-29 DIAGNOSIS — G8929 Other chronic pain: Secondary | ICD-10-CM | POA: Diagnosis not present

## 2021-03-29 DIAGNOSIS — I255 Ischemic cardiomyopathy: Secondary | ICD-10-CM | POA: Diagnosis not present

## 2021-03-29 DIAGNOSIS — K219 Gastro-esophageal reflux disease without esophagitis: Secondary | ICD-10-CM | POA: Diagnosis not present

## 2021-03-29 DIAGNOSIS — E1140 Type 2 diabetes mellitus with diabetic neuropathy, unspecified: Secondary | ICD-10-CM | POA: Diagnosis not present

## 2021-03-29 DIAGNOSIS — M810 Age-related osteoporosis without current pathological fracture: Secondary | ICD-10-CM | POA: Diagnosis not present

## 2021-03-30 DIAGNOSIS — S2242XG Multiple fractures of ribs, left side, subsequent encounter for fracture with delayed healing: Secondary | ICD-10-CM | POA: Diagnosis not present

## 2021-03-30 DIAGNOSIS — J449 Chronic obstructive pulmonary disease, unspecified: Secondary | ICD-10-CM | POA: Diagnosis not present

## 2021-03-30 DIAGNOSIS — N1832 Chronic kidney disease, stage 3b: Secondary | ICD-10-CM | POA: Diagnosis not present

## 2021-03-30 DIAGNOSIS — J9611 Chronic respiratory failure with hypoxia: Secondary | ICD-10-CM | POA: Diagnosis not present

## 2021-04-04 DIAGNOSIS — J449 Chronic obstructive pulmonary disease, unspecified: Secondary | ICD-10-CM | POA: Diagnosis not present

## 2021-04-21 ENCOUNTER — Other Ambulatory Visit: Payer: Self-pay | Admitting: *Deleted

## 2021-04-21 MED ORDER — CARVEDILOL 6.25 MG PO TABS: 6.2500 mg | ORAL_TABLET | Freq: Two times a day (BID) | ORAL | 6 refills | Status: DC

## 2021-04-24 ENCOUNTER — Emergency Department (HOSPITAL_COMMUNITY): Payer: Medicare Other

## 2021-04-24 ENCOUNTER — Inpatient Hospital Stay (HOSPITAL_COMMUNITY)
Admission: EM | Admit: 2021-04-24 | Discharge: 2021-05-05 | DRG: 811 | Disposition: A | Discharge status: Home Health | Payer: Medicare Other | Attending: Internal Medicine | Admitting: Internal Medicine

## 2021-04-24 DIAGNOSIS — Z8249 Family history of ischemic heart disease and other diseases of the circulatory system: Secondary | ICD-10-CM

## 2021-04-24 DIAGNOSIS — H532 Diplopia: Secondary | ICD-10-CM | POA: Diagnosis present

## 2021-04-24 DIAGNOSIS — G2581 Restless legs syndrome: Secondary | ICD-10-CM | POA: Diagnosis not present

## 2021-04-24 DIAGNOSIS — R0600 Dyspnea, unspecified: Secondary | ICD-10-CM

## 2021-04-24 DIAGNOSIS — I2510 Atherosclerotic heart disease of native coronary artery without angina pectoris: Secondary | ICD-10-CM | POA: Diagnosis not present

## 2021-04-24 DIAGNOSIS — Z9981 Dependence on supplemental oxygen: Secondary | ICD-10-CM

## 2021-04-24 DIAGNOSIS — I672 Cerebral atherosclerosis: Secondary | ICD-10-CM | POA: Diagnosis not present

## 2021-04-24 DIAGNOSIS — R0609 Other forms of dyspnea: Secondary | ICD-10-CM | POA: Diagnosis not present

## 2021-04-24 DIAGNOSIS — N132 Hydronephrosis with renal and ureteral calculous obstruction: Secondary | ICD-10-CM | POA: Diagnosis not present

## 2021-04-24 DIAGNOSIS — I700 Atherosclerosis of aorta: Secondary | ICD-10-CM | POA: Diagnosis not present

## 2021-04-24 DIAGNOSIS — F419 Anxiety disorder, unspecified: Secondary | ICD-10-CM | POA: Diagnosis present

## 2021-04-24 DIAGNOSIS — J441 Chronic obstructive pulmonary disease with (acute) exacerbation: Secondary | ICD-10-CM | POA: Diagnosis not present

## 2021-04-24 DIAGNOSIS — G709 Myoneural disorder, unspecified: Secondary | ICD-10-CM | POA: Diagnosis not present

## 2021-04-24 DIAGNOSIS — I110 Hypertensive heart disease with heart failure: Secondary | ICD-10-CM | POA: Diagnosis not present

## 2021-04-24 DIAGNOSIS — I428 Other cardiomyopathies: Secondary | ICD-10-CM | POA: Diagnosis present

## 2021-04-24 DIAGNOSIS — I10 Essential (primary) hypertension: Secondary | ICD-10-CM | POA: Diagnosis not present

## 2021-04-24 DIAGNOSIS — M419 Scoliosis, unspecified: Secondary | ICD-10-CM | POA: Diagnosis present

## 2021-04-24 DIAGNOSIS — E1151 Type 2 diabetes mellitus with diabetic peripheral angiopathy without gangrene: Secondary | ICD-10-CM | POA: Diagnosis not present

## 2021-04-24 DIAGNOSIS — I5042 Chronic combined systolic (congestive) and diastolic (congestive) heart failure: Secondary | ICD-10-CM | POA: Diagnosis not present

## 2021-04-24 DIAGNOSIS — D509 Iron deficiency anemia, unspecified: Secondary | ICD-10-CM | POA: Diagnosis present

## 2021-04-24 DIAGNOSIS — Z87442 Personal history of urinary calculi: Secondary | ICD-10-CM

## 2021-04-24 DIAGNOSIS — I739 Peripheral vascular disease, unspecified: Secondary | ICD-10-CM

## 2021-04-24 DIAGNOSIS — R0689 Other abnormalities of breathing: Secondary | ICD-10-CM | POA: Diagnosis not present

## 2021-04-24 DIAGNOSIS — R54 Age-related physical debility: Secondary | ICD-10-CM | POA: Diagnosis not present

## 2021-04-24 DIAGNOSIS — M810 Age-related osteoporosis without current pathological fracture: Secondary | ICD-10-CM | POA: Diagnosis present

## 2021-04-24 DIAGNOSIS — D649 Anemia, unspecified: Secondary | ICD-10-CM

## 2021-04-24 DIAGNOSIS — E7800 Pure hypercholesterolemia, unspecified: Secondary | ICD-10-CM | POA: Diagnosis not present

## 2021-04-24 DIAGNOSIS — E1121 Type 2 diabetes mellitus with diabetic nephropathy: Secondary | ICD-10-CM

## 2021-04-24 DIAGNOSIS — Z95810 Presence of automatic (implantable) cardiac defibrillator: Secondary | ICD-10-CM

## 2021-04-24 DIAGNOSIS — Z79899 Other long term (current) drug therapy: Secondary | ICD-10-CM

## 2021-04-24 DIAGNOSIS — I480 Paroxysmal atrial fibrillation: Secondary | ICD-10-CM | POA: Diagnosis not present

## 2021-04-24 DIAGNOSIS — K922 Gastrointestinal hemorrhage, unspecified: Secondary | ICD-10-CM | POA: Diagnosis not present

## 2021-04-24 DIAGNOSIS — G7000 Myasthenia gravis without (acute) exacerbation: Secondary | ICD-10-CM | POA: Diagnosis present

## 2021-04-24 DIAGNOSIS — E119 Type 2 diabetes mellitus without complications: Secondary | ICD-10-CM

## 2021-04-24 DIAGNOSIS — Z66 Do not resuscitate: Secondary | ICD-10-CM | POA: Diagnosis not present

## 2021-04-24 DIAGNOSIS — Z23 Encounter for immunization: Secondary | ICD-10-CM | POA: Diagnosis present

## 2021-04-24 DIAGNOSIS — S2243XA Multiple fractures of ribs, bilateral, initial encounter for closed fracture: Secondary | ICD-10-CM | POA: Diagnosis present

## 2021-04-24 DIAGNOSIS — S2220XA Unspecified fracture of sternum, initial encounter for closed fracture: Secondary | ICD-10-CM | POA: Diagnosis not present

## 2021-04-24 DIAGNOSIS — D62 Acute posthemorrhagic anemia: Secondary | ICD-10-CM | POA: Diagnosis not present

## 2021-04-24 DIAGNOSIS — N289 Disorder of kidney and ureter, unspecified: Secondary | ICD-10-CM | POA: Diagnosis present

## 2021-04-24 DIAGNOSIS — Z90710 Acquired absence of both cervix and uterus: Secondary | ICD-10-CM

## 2021-04-24 DIAGNOSIS — K7689 Other specified diseases of liver: Secondary | ICD-10-CM | POA: Diagnosis not present

## 2021-04-24 DIAGNOSIS — R400 Somnolence: Secondary | ICD-10-CM | POA: Diagnosis not present

## 2021-04-24 DIAGNOSIS — R069 Unspecified abnormalities of breathing: Secondary | ICD-10-CM | POA: Diagnosis not present

## 2021-04-24 DIAGNOSIS — J9811 Atelectasis: Secondary | ICD-10-CM | POA: Diagnosis not present

## 2021-04-24 DIAGNOSIS — M353 Polymyalgia rheumatica: Secondary | ICD-10-CM | POA: Diagnosis not present

## 2021-04-24 DIAGNOSIS — R531 Weakness: Secondary | ICD-10-CM

## 2021-04-24 DIAGNOSIS — N1330 Unspecified hydronephrosis: Secondary | ICD-10-CM | POA: Diagnosis not present

## 2021-04-24 DIAGNOSIS — F17210 Nicotine dependence, cigarettes, uncomplicated: Secondary | ICD-10-CM | POA: Diagnosis present

## 2021-04-24 DIAGNOSIS — M542 Cervicalgia: Secondary | ICD-10-CM | POA: Diagnosis not present

## 2021-04-24 DIAGNOSIS — Z743 Need for continuous supervision: Secondary | ICD-10-CM | POA: Diagnosis not present

## 2021-04-24 DIAGNOSIS — N3289 Other specified disorders of bladder: Secondary | ICD-10-CM | POA: Diagnosis not present

## 2021-04-24 DIAGNOSIS — J90 Pleural effusion, not elsewhere classified: Secondary | ICD-10-CM | POA: Diagnosis not present

## 2021-04-24 DIAGNOSIS — R404 Transient alteration of awareness: Secondary | ICD-10-CM | POA: Diagnosis not present

## 2021-04-24 DIAGNOSIS — I25119 Atherosclerotic heart disease of native coronary artery with unspecified angina pectoris: Secondary | ICD-10-CM | POA: Diagnosis present

## 2021-04-24 DIAGNOSIS — I517 Cardiomegaly: Secondary | ICD-10-CM | POA: Diagnosis not present

## 2021-04-24 DIAGNOSIS — Z20822 Contact with and (suspected) exposure to covid-19: Secondary | ICD-10-CM | POA: Diagnosis not present

## 2021-04-24 DIAGNOSIS — Z7952 Long term (current) use of systemic steroids: Secondary | ICD-10-CM

## 2021-04-24 DIAGNOSIS — H02402 Unspecified ptosis of left eyelid: Secondary | ICD-10-CM | POA: Diagnosis present

## 2021-04-24 DIAGNOSIS — R262 Difficulty in walking, not elsewhere classified: Secondary | ICD-10-CM | POA: Diagnosis not present

## 2021-04-24 DIAGNOSIS — I6523 Occlusion and stenosis of bilateral carotid arteries: Secondary | ICD-10-CM | POA: Diagnosis not present

## 2021-04-24 DIAGNOSIS — Z8711 Personal history of peptic ulcer disease: Secondary | ICD-10-CM

## 2021-04-24 DIAGNOSIS — K921 Melena: Secondary | ICD-10-CM | POA: Diagnosis not present

## 2021-04-24 DIAGNOSIS — E1129 Type 2 diabetes mellitus with other diabetic kidney complication: Secondary | ICD-10-CM | POA: Diagnosis not present

## 2021-04-24 DIAGNOSIS — Z955 Presence of coronary angioplasty implant and graft: Secondary | ICD-10-CM | POA: Diagnosis not present

## 2021-04-24 DIAGNOSIS — A419 Sepsis, unspecified organism: Secondary | ICD-10-CM

## 2021-04-24 DIAGNOSIS — Z7902 Long term (current) use of antithrombotics/antiplatelets: Secondary | ICD-10-CM

## 2021-04-24 DIAGNOSIS — R52 Pain, unspecified: Secondary | ICD-10-CM

## 2021-04-24 DIAGNOSIS — J9621 Acute and chronic respiratory failure with hypoxia: Secondary | ICD-10-CM | POA: Diagnosis present

## 2021-04-24 DIAGNOSIS — Z7982 Long term (current) use of aspirin: Secondary | ICD-10-CM

## 2021-04-24 DIAGNOSIS — I6521 Occlusion and stenosis of right carotid artery: Secondary | ICD-10-CM | POA: Diagnosis present

## 2021-04-24 DIAGNOSIS — M8588 Other specified disorders of bone density and structure, other site: Secondary | ICD-10-CM | POA: Diagnosis not present

## 2021-04-24 DIAGNOSIS — R0602 Shortness of breath: Secondary | ICD-10-CM | POA: Diagnosis not present

## 2021-04-24 DIAGNOSIS — S2242XA Multiple fractures of ribs, left side, initial encounter for closed fracture: Secondary | ICD-10-CM | POA: Diagnosis not present

## 2021-04-24 LAB — I-STAT CHEM 8, ED
BUN: 10 mg/dL (ref 8–23)
Calcium, Ion: 1.12 mmol/L — ABNORMAL LOW (ref 1.15–1.40)
Chloride: 103 mmol/L (ref 98–111)
Creatinine, Ser: 0.9 mg/dL (ref 0.44–1.00)
Glucose, Bld: 171 mg/dL — ABNORMAL HIGH (ref 70–99)
HCT: 22 % — ABNORMAL LOW (ref 36.0–46.0)
Hemoglobin: 7.5 g/dL — ABNORMAL LOW (ref 12.0–15.0)
Potassium: 3.8 mmol/L (ref 3.5–5.1)
Sodium: 142 mmol/L (ref 135–145)
TCO2: 32 mmol/L (ref 22–32)

## 2021-04-24 LAB — CBC WITH DIFFERENTIAL/PLATELET
Abs Immature Granulocytes: 0.02 10*3/uL (ref 0.00–0.07)
Basophils Absolute: 0 10*3/uL (ref 0.0–0.1)
Basophils Relative: 0 %
Eosinophils Absolute: 0 10*3/uL (ref 0.0–0.5)
Eosinophils Relative: 0 %
HCT: 24.5 % — ABNORMAL LOW (ref 36.0–46.0)
Hemoglobin: 6.5 g/dL — CL (ref 12.0–15.0)
Immature Granulocytes: 0 %
Lymphocytes Relative: 13 %
Lymphs Abs: 1.1 10*3/uL (ref 0.7–4.0)
MCH: 20.4 pg — ABNORMAL LOW (ref 26.0–34.0)
MCHC: 26.5 g/dL — ABNORMAL LOW (ref 30.0–36.0)
MCV: 76.8 fL — ABNORMAL LOW (ref 80.0–100.0)
Monocytes Absolute: 0.5 10*3/uL (ref 0.1–1.0)
Monocytes Relative: 6 %
Neutro Abs: 6.6 10*3/uL (ref 1.7–7.7)
Neutrophils Relative %: 81 %
Platelets: 544 10*3/uL — ABNORMAL HIGH (ref 150–400)
RBC: 3.19 MIL/uL — ABNORMAL LOW (ref 3.87–5.11)
RDW: 15.5 % (ref 11.5–15.5)
WBC: 8.3 10*3/uL (ref 4.0–10.5)
nRBC: 0 % (ref 0.0–0.2)

## 2021-04-24 LAB — COMPREHENSIVE METABOLIC PANEL
ALT: 14 U/L (ref 0–44)
AST: 15 U/L (ref 15–41)
Albumin: 3.4 g/dL — ABNORMAL LOW (ref 3.5–5.0)
Alkaline Phosphatase: 49 U/L (ref 38–126)
Anion gap: 7 (ref 5–15)
BUN: 11 mg/dL (ref 8–23)
CO2: 31 mmol/L (ref 22–32)
Calcium: 8.8 mg/dL — ABNORMAL LOW (ref 8.9–10.3)
Chloride: 106 mmol/L (ref 98–111)
Creatinine, Ser: 1.01 mg/dL — ABNORMAL HIGH (ref 0.44–1.00)
GFR, Estimated: 58 mL/min — ABNORMAL LOW (ref >60)
Glucose, Bld: 177 mg/dL — ABNORMAL HIGH (ref 70–99)
Potassium: 3.8 mmol/L (ref 3.5–5.1)
Sodium: 144 mmol/L (ref 135–145)
Total Bilirubin: 0.6 mg/dL (ref 0.3–1.2)
Total Protein: 6 g/dL — ABNORMAL LOW (ref 6.5–8.1)

## 2021-04-24 LAB — PREPARE RBC (CROSSMATCH)

## 2021-04-24 LAB — FERRITIN: Ferritin: 15 ng/mL (ref 11–307)

## 2021-04-24 LAB — RETICULOCYTES
Immature Retic Fract: 25.9 % — ABNORMAL HIGH (ref 2.3–15.9)
RBC.: 3 MIL/uL — ABNORMAL LOW (ref 3.87–5.11)
Retic Count, Absolute: 54.6 10*3/uL (ref 19.0–186.0)
Retic Ct Pct: 1.8 % (ref 0.4–3.1)

## 2021-04-24 LAB — LACTIC ACID, PLASMA
Lactic Acid, Venous: 1.3 mmol/L (ref 0.5–1.9)
Lactic Acid, Venous: 2 mmol/L (ref 0.5–1.9)

## 2021-04-24 LAB — IRON AND TIBC
Iron: 11 ug/dL — ABNORMAL LOW (ref 28–170)
Saturation Ratios: 2 % — ABNORMAL LOW (ref 10.4–31.8)
TIBC: 505 ug/dL — ABNORMAL HIGH (ref 250–450)
UIBC: 494 ug/dL

## 2021-04-24 LAB — RESP PANEL BY RT-PCR (FLU A&B, COVID) ARPGX2
Influenza A by PCR: NEGATIVE
Influenza B by PCR: NEGATIVE
SARS Coronavirus 2 by RT PCR: NEGATIVE

## 2021-04-24 LAB — POC OCCULT BLOOD, ED: Fecal Occult Bld: POSITIVE — AB

## 2021-04-24 LAB — GLUCOSE, CAPILLARY: Glucose-Capillary: 370 mg/dL — ABNORMAL HIGH (ref 70–99)

## 2021-04-24 LAB — TROPONIN I (HIGH SENSITIVITY)
Troponin I (High Sensitivity): 11 ng/L (ref <18)
Troponin I (High Sensitivity): 8 ng/L (ref <18)

## 2021-04-24 LAB — PROTIME-INR
INR: 0.9 (ref 0.8–1.2)
Prothrombin Time: 12.5 seconds (ref 11.4–15.2)

## 2021-04-24 LAB — BRAIN NATRIURETIC PEPTIDE: B Natriuretic Peptide: 2483.3 pg/mL — ABNORMAL HIGH (ref 0.0–100.0)

## 2021-04-24 LAB — MAGNESIUM: Magnesium: 1.9 mg/dL (ref 1.7–2.4)

## 2021-04-24 MED ORDER — SODIUM CHLORIDE 0.9 % IV SOLN
1.0000 g | Freq: Once | INTRAVENOUS | Status: AC
  Administered 2021-04-24: 1 g via INTRAVENOUS
  Filled 2021-04-24: qty 10

## 2021-04-24 MED ORDER — ONDANSETRON HCL 4 MG/2ML IJ SOLN
4.0000 mg | Freq: Once | INTRAMUSCULAR | Status: AC
  Administered 2021-04-24: 4 mg via INTRAVENOUS
  Filled 2021-04-24: qty 2

## 2021-04-24 MED ORDER — LOSARTAN POTASSIUM 50 MG PO TABS
50.0000 mg | ORAL_TABLET | Freq: Every evening | ORAL | Status: DC
  Administered 2021-04-25 – 2021-04-28 (×4): 50 mg via ORAL
  Filled 2021-04-24 (×4): qty 1

## 2021-04-24 MED ORDER — SODIUM CHLORIDE 0.9% FLUSH
3.0000 mL | Freq: Two times a day (BID) | INTRAVENOUS | Status: DC
  Administered 2021-04-24 – 2021-05-04 (×17): 3 mL via INTRAVENOUS

## 2021-04-24 MED ORDER — CARVEDILOL 6.25 MG PO TABS
6.2500 mg | ORAL_TABLET | Freq: Two times a day (BID) | ORAL | Status: DC
  Administered 2021-04-25 – 2021-04-27 (×6): 6.25 mg via ORAL
  Filled 2021-04-24 (×6): qty 1

## 2021-04-24 MED ORDER — ACETAMINOPHEN 325 MG PO TABS
650.0000 mg | ORAL_TABLET | Freq: Four times a day (QID) | ORAL | Status: DC | PRN
  Administered 2021-04-27 – 2021-04-29 (×3): 650 mg via ORAL
  Filled 2021-04-24 (×3): qty 2

## 2021-04-24 MED ORDER — ROPINIROLE HCL 0.5 MG PO TABS
1.0000 mg | ORAL_TABLET | Freq: Every evening | ORAL | Status: DC | PRN
  Administered 2021-04-24 – 2021-05-04 (×10): 1 mg via ORAL
  Filled 2021-04-24 (×10): qty 2

## 2021-04-24 MED ORDER — ALBUTEROL SULFATE (2.5 MG/3ML) 0.083% IN NEBU
2.5000 mg | INHALATION_SOLUTION | RESPIRATORY_TRACT | Status: DC | PRN
  Administered 2021-05-02 – 2021-05-04 (×4): 2.5 mg via RESPIRATORY_TRACT
  Filled 2021-04-24 (×4): qty 3

## 2021-04-24 MED ORDER — INSULIN ASPART 100 UNIT/ML IJ SOLN: 0.0000 [IU] | Freq: Three times a day (TID) | INTRAMUSCULAR | Status: DC

## 2021-04-24 MED ORDER — METHYLPREDNISOLONE SODIUM SUCC 125 MG IJ SOLR
125.0000 mg | Freq: Once | INTRAMUSCULAR | Status: AC
  Administered 2021-04-24: 125 mg via INTRAVENOUS
  Filled 2021-04-24: qty 2

## 2021-04-24 MED ORDER — IPRATROPIUM-ALBUTEROL 0.5-2.5 (3) MG/3ML IN SOLN
3.0000 mL | Freq: Once | RESPIRATORY_TRACT | Status: AC
  Administered 2021-04-24: 3 mL via RESPIRATORY_TRACT
  Filled 2021-04-24: qty 3

## 2021-04-24 MED ORDER — ONDANSETRON HCL 4 MG PO TABS: 4.0000 mg | ORAL_TABLET | Freq: Three times a day (TID) | ORAL | Status: DC | PRN

## 2021-04-24 MED ORDER — LOSARTAN POTASSIUM 50 MG PO TABS: 25.0000 mg | ORAL_TABLET | Freq: Two times a day (BID) | ORAL | Status: DC

## 2021-04-24 MED ORDER — IPRATROPIUM-ALBUTEROL 0.5-2.5 (3) MG/3ML IN SOLN
3.0000 mL | Freq: Four times a day (QID) | RESPIRATORY_TRACT | Status: DC
  Administered 2021-04-24 – 2021-04-25 (×2): 3 mL via RESPIRATORY_TRACT
  Filled 2021-04-24 (×2): qty 3

## 2021-04-24 MED ORDER — HYDROCODONE-ACETAMINOPHEN 5-325 MG PO TABS
1.0000 | ORAL_TABLET | Freq: Once | ORAL | Status: AC
  Administered 2021-04-24: 1 via ORAL
  Filled 2021-04-24: qty 1

## 2021-04-24 MED ORDER — TIOTROPIUM BROMIDE MONOHYDRATE 2.5 MCG/ACT IN AERS: 2.0000 | INHALATION_SPRAY | Freq: Every day | RESPIRATORY_TRACT | Status: DC

## 2021-04-24 MED ORDER — INSULIN ASPART 100 UNIT/ML IJ SOLN
0.0000 [IU] | Freq: Every day | INTRAMUSCULAR | Status: DC
Start: 2021-04-25 — End: 2021-05-05
  Administered 2021-04-24: 5 [IU] via SUBCUTANEOUS
  Administered 2021-04-27: 2 [IU] via SUBCUTANEOUS
  Administered 2021-04-28: 3 [IU] via SUBCUTANEOUS
  Administered 2021-04-30 – 2021-05-04 (×4): 2 [IU] via SUBCUTANEOUS

## 2021-04-24 MED ORDER — ACETAMINOPHEN 650 MG RE SUPP: 650.0000 mg | Freq: Four times a day (QID) | RECTAL | Status: DC | PRN

## 2021-04-24 MED ORDER — FUROSEMIDE 10 MG/ML IJ SOLN
40.0000 mg | Freq: Two times a day (BID) | INTRAMUSCULAR | Status: DC
  Administered 2021-04-25: 30 mg via INTRAVENOUS
  Filled 2021-04-24: qty 4

## 2021-04-24 MED ORDER — PANTOPRAZOLE SODIUM 40 MG PO TBEC
40.0000 mg | DELAYED_RELEASE_TABLET | Freq: Every day | ORAL | Status: DC
Start: 2021-04-24 — End: 2021-05-05
  Administered 2021-04-24 – 2021-05-05 (×12): 40 mg via ORAL
  Filled 2021-04-24 (×12): qty 1

## 2021-04-24 MED ORDER — INSULIN ASPART 100 UNIT/ML IJ SOLN
0.0000 [IU] | Freq: Three times a day (TID) | INTRAMUSCULAR | Status: DC
  Administered 2021-04-25: 3 [IU] via SUBCUTANEOUS
  Administered 2021-04-25 (×2): 1 [IU] via SUBCUTANEOUS
  Administered 2021-04-26: 7 [IU] via SUBCUTANEOUS
  Administered 2021-04-26: 3 [IU] via SUBCUTANEOUS
  Administered 2021-04-27: 1 [IU] via SUBCUTANEOUS
  Administered 2021-04-27: 2 [IU] via SUBCUTANEOUS
  Administered 2021-04-27: 3 [IU] via SUBCUTANEOUS
  Administered 2021-04-28: 7 [IU] via SUBCUTANEOUS
  Administered 2021-04-28: 2 [IU] via SUBCUTANEOUS
  Administered 2021-04-28 – 2021-04-29 (×2): 1 [IU] via SUBCUTANEOUS
  Administered 2021-04-29: 2 [IU] via SUBCUTANEOUS
  Administered 2021-04-29: 7 [IU] via SUBCUTANEOUS
  Administered 2021-04-30: 5 [IU] via SUBCUTANEOUS
  Administered 2021-04-30: 2 [IU] via SUBCUTANEOUS
  Administered 2021-04-30 – 2021-05-01 (×2): 1 [IU] via SUBCUTANEOUS
  Administered 2021-05-01: 2 [IU] via SUBCUTANEOUS
  Administered 2021-05-01: 1 [IU] via SUBCUTANEOUS
  Administered 2021-05-02: 5 [IU] via SUBCUTANEOUS
  Administered 2021-05-02: 2 [IU] via SUBCUTANEOUS
  Administered 2021-05-02: 1 [IU] via SUBCUTANEOUS
  Administered 2021-05-03: 2 [IU] via SUBCUTANEOUS
  Administered 2021-05-03: 5 [IU] via SUBCUTANEOUS
  Administered 2021-05-04: 7 [IU] via SUBCUTANEOUS
  Administered 2021-05-04: 3 [IU] via SUBCUTANEOUS
  Administered 2021-05-04: 2 [IU] via SUBCUTANEOUS
  Administered 2021-05-05: 7 [IU] via SUBCUTANEOUS
  Administered 2021-05-05: 1 [IU] via SUBCUTANEOUS

## 2021-04-24 MED ORDER — TRAMADOL HCL 50 MG PO TABS
50.0000 mg | ORAL_TABLET | Freq: Three times a day (TID) | ORAL | Status: DC | PRN
  Administered 2021-04-25 (×2): 50 mg via ORAL
  Filled 2021-04-24 (×2): qty 1

## 2021-04-24 MED ORDER — FUROSEMIDE 10 MG/ML IJ SOLN
40.0000 mg | Freq: Once | INTRAMUSCULAR | Status: AC
  Administered 2021-04-24: 40 mg via INTRAVENOUS
  Filled 2021-04-24: qty 4

## 2021-04-24 MED ORDER — FENTANYL CITRATE PF 50 MCG/ML IJ SOSY
12.5000 ug | PREFILLED_SYRINGE | Freq: Once | INTRAMUSCULAR | Status: AC
  Administered 2021-04-24: 12.5 ug via INTRAVENOUS
  Filled 2021-04-24: qty 1

## 2021-04-24 MED ORDER — SODIUM CHLORIDE 0.9 % IV SOLN
500.0000 mg | Freq: Once | INTRAVENOUS | Status: AC
  Administered 2021-04-24: 500 mg via INTRAVENOUS
  Filled 2021-04-24: qty 500

## 2021-04-24 MED ORDER — SODIUM CHLORIDE 0.9 % IV SOLN: 10.0000 mL/h | Freq: Once | INTRAVENOUS | Status: DC

## 2021-04-24 MED ORDER — IPRATROPIUM-ALBUTEROL 20-100 MCG/ACT IN AERS: 1.0000 | INHALATION_SPRAY | Freq: Four times a day (QID) | RESPIRATORY_TRACT | Status: DC | PRN

## 2021-04-24 MED ORDER — PREDNISONE 20 MG PO TABS
40.0000 mg | ORAL_TABLET | Freq: Every day | ORAL | Status: AC
  Administered 2021-04-25 – 2021-04-29 (×5): 40 mg via ORAL
  Filled 2021-04-24 (×5): qty 2

## 2021-04-24 MED ORDER — MOMETASONE FURO-FORMOTEROL FUM 200-5 MCG/ACT IN AERO
2.0000 | INHALATION_SPRAY | Freq: Two times a day (BID) | RESPIRATORY_TRACT | Status: DC
  Administered 2021-04-25 – 2021-05-05 (×20): 2 via RESPIRATORY_TRACT
  Filled 2021-04-24: qty 8.8

## 2021-04-24 NOTE — ED Triage Notes (Signed)
Pt BIB [facility] for SOB x2 days, caretaker called 911. EMS reports 70% on 2L [place] on arrival, improved to 97-100% on 10L [place], pt still tachypneic at this time. EMS reports pt has been dizzy, weak x1 week. Hx of HF, pacemaker, ICD, spontaneous fx d/t osteoporosis.
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
## 2021-04-24 NOTE — ED Notes (Signed)
Blood consent obtained

## 2021-04-24 NOTE — ED Notes (Signed)
Attempted to call report, NS [name] states that charge RN is reviewing pt and someone will call for report very shortly.

## 2021-04-25 ENCOUNTER — Other Ambulatory Visit: Payer: Self-pay

## 2021-04-25 ENCOUNTER — Inpatient Hospital Stay (HOSPITAL_COMMUNITY): Payer: Medicare Other

## 2021-04-25 ENCOUNTER — Encounter (HOSPITAL_COMMUNITY): Payer: Self-pay | Admitting: Internal Medicine

## 2021-04-25 ENCOUNTER — Observation Stay (HOSPITAL_COMMUNITY): Payer: Medicare Other

## 2021-04-25 DIAGNOSIS — I5042 Chronic combined systolic (congestive) and diastolic (congestive) heart failure: Secondary | ICD-10-CM | POA: Diagnosis present

## 2021-04-25 DIAGNOSIS — R400 Somnolence: Secondary | ICD-10-CM | POA: Diagnosis not present

## 2021-04-25 DIAGNOSIS — R262 Difficulty in walking, not elsewhere classified: Secondary | ICD-10-CM | POA: Diagnosis not present

## 2021-04-25 DIAGNOSIS — N1330 Unspecified hydronephrosis: Secondary | ICD-10-CM | POA: Diagnosis not present

## 2021-04-25 DIAGNOSIS — I672 Cerebral atherosclerosis: Secondary | ICD-10-CM | POA: Diagnosis not present

## 2021-04-25 DIAGNOSIS — H532 Diplopia: Secondary | ICD-10-CM | POA: Diagnosis present

## 2021-04-25 DIAGNOSIS — I6523 Occlusion and stenosis of bilateral carotid arteries: Secondary | ICD-10-CM | POA: Diagnosis not present

## 2021-04-25 DIAGNOSIS — D62 Acute posthemorrhagic anemia: Secondary | ICD-10-CM | POA: Diagnosis present

## 2021-04-25 DIAGNOSIS — K922 Gastrointestinal hemorrhage, unspecified: Secondary | ICD-10-CM | POA: Diagnosis present

## 2021-04-25 DIAGNOSIS — J9621 Acute and chronic respiratory failure with hypoxia: Secondary | ICD-10-CM | POA: Diagnosis not present

## 2021-04-25 DIAGNOSIS — Z20822 Contact with and (suspected) exposure to covid-19: Secondary | ICD-10-CM | POA: Diagnosis present

## 2021-04-25 DIAGNOSIS — M353 Polymyalgia rheumatica: Secondary | ICD-10-CM | POA: Diagnosis present

## 2021-04-25 DIAGNOSIS — Z66 Do not resuscitate: Secondary | ICD-10-CM | POA: Diagnosis present

## 2021-04-25 DIAGNOSIS — I428 Other cardiomyopathies: Secondary | ICD-10-CM | POA: Diagnosis present

## 2021-04-25 DIAGNOSIS — M542 Cervicalgia: Secondary | ICD-10-CM | POA: Diagnosis not present

## 2021-04-25 DIAGNOSIS — R54 Age-related physical debility: Secondary | ICD-10-CM | POA: Diagnosis present

## 2021-04-25 DIAGNOSIS — D509 Iron deficiency anemia, unspecified: Secondary | ICD-10-CM | POA: Diagnosis present

## 2021-04-25 DIAGNOSIS — I110 Hypertensive heart disease with heart failure: Secondary | ICD-10-CM | POA: Diagnosis present

## 2021-04-25 DIAGNOSIS — S2220XA Unspecified fracture of sternum, initial encounter for closed fracture: Secondary | ICD-10-CM | POA: Diagnosis not present

## 2021-04-25 DIAGNOSIS — G7000 Myasthenia gravis without (acute) exacerbation: Secondary | ICD-10-CM | POA: Diagnosis present

## 2021-04-25 DIAGNOSIS — N289 Disorder of kidney and ureter, unspecified: Secondary | ICD-10-CM | POA: Diagnosis present

## 2021-04-25 DIAGNOSIS — R531 Weakness: Secondary | ICD-10-CM | POA: Diagnosis not present

## 2021-04-25 DIAGNOSIS — N132 Hydronephrosis with renal and ureteral calculous obstruction: Secondary | ICD-10-CM | POA: Diagnosis present

## 2021-04-25 DIAGNOSIS — S2243XA Multiple fractures of ribs, bilateral, initial encounter for closed fracture: Secondary | ICD-10-CM | POA: Diagnosis present

## 2021-04-25 DIAGNOSIS — G709 Myoneural disorder, unspecified: Secondary | ICD-10-CM | POA: Diagnosis not present

## 2021-04-25 DIAGNOSIS — K7689 Other specified diseases of liver: Secondary | ICD-10-CM | POA: Diagnosis not present

## 2021-04-25 DIAGNOSIS — F419 Anxiety disorder, unspecified: Secondary | ICD-10-CM | POA: Diagnosis present

## 2021-04-25 DIAGNOSIS — S2242XA Multiple fractures of ribs, left side, initial encounter for closed fracture: Secondary | ICD-10-CM | POA: Diagnosis not present

## 2021-04-25 DIAGNOSIS — J441 Chronic obstructive pulmonary disease with (acute) exacerbation: Secondary | ICD-10-CM | POA: Diagnosis present

## 2021-04-25 DIAGNOSIS — R0609 Other forms of dyspnea: Secondary | ICD-10-CM | POA: Diagnosis not present

## 2021-04-25 DIAGNOSIS — E1129 Type 2 diabetes mellitus with other diabetic kidney complication: Secondary | ICD-10-CM | POA: Diagnosis present

## 2021-04-25 DIAGNOSIS — E1151 Type 2 diabetes mellitus with diabetic peripheral angiopathy without gangrene: Secondary | ICD-10-CM | POA: Diagnosis present

## 2021-04-25 DIAGNOSIS — I480 Paroxysmal atrial fibrillation: Secondary | ICD-10-CM | POA: Diagnosis present

## 2021-04-25 DIAGNOSIS — M8588 Other specified disorders of bone density and structure, other site: Secondary | ICD-10-CM | POA: Diagnosis not present

## 2021-04-25 DIAGNOSIS — N3289 Other specified disorders of bladder: Secondary | ICD-10-CM | POA: Diagnosis not present

## 2021-04-25 DIAGNOSIS — D649 Anemia, unspecified: Secondary | ICD-10-CM | POA: Diagnosis present

## 2021-04-25 DIAGNOSIS — J90 Pleural effusion, not elsewhere classified: Secondary | ICD-10-CM | POA: Diagnosis not present

## 2021-04-25 DIAGNOSIS — I700 Atherosclerosis of aorta: Secondary | ICD-10-CM | POA: Diagnosis not present

## 2021-04-25 DIAGNOSIS — G2581 Restless legs syndrome: Secondary | ICD-10-CM | POA: Diagnosis present

## 2021-04-25 DIAGNOSIS — E7800 Pure hypercholesterolemia, unspecified: Secondary | ICD-10-CM | POA: Diagnosis present

## 2021-04-25 DIAGNOSIS — Z23 Encounter for immunization: Secondary | ICD-10-CM | POA: Diagnosis present

## 2021-04-25 DIAGNOSIS — I6521 Occlusion and stenosis of right carotid artery: Secondary | ICD-10-CM | POA: Diagnosis present

## 2021-04-25 LAB — GLUCOSE, CAPILLARY
Glucose-Capillary: 127 mg/dL — ABNORMAL HIGH (ref 70–99)
Glucose-Capillary: 127 mg/dL — ABNORMAL HIGH (ref 70–99)
Glucose-Capillary: 241 mg/dL — ABNORMAL HIGH (ref 70–99)
Glucose-Capillary: 116 mg/dL — ABNORMAL HIGH (ref 70–99)

## 2021-04-25 LAB — CBC
HCT: 26.8 % — ABNORMAL LOW (ref 36.0–46.0)
Hemoglobin: 7.7 g/dL — ABNORMAL LOW (ref 12.0–15.0)
MCH: 21.6 pg — ABNORMAL LOW (ref 26.0–34.0)
MCHC: 28.7 g/dL — ABNORMAL LOW (ref 30.0–36.0)
MCV: 75.3 fL — ABNORMAL LOW (ref 80.0–100.0)
Platelets: 470 10*3/uL — ABNORMAL HIGH (ref 150–400)
RBC: 3.56 MIL/uL — ABNORMAL LOW (ref 3.87–5.11)
RDW: 15.6 % — ABNORMAL HIGH (ref 11.5–15.5)
WBC: 8.8 10*3/uL (ref 4.0–10.5)
nRBC: 0 % (ref 0.0–0.2)

## 2021-04-25 LAB — C-REACTIVE PROTEIN: CRP: 0.6 mg/dL (ref <1.0)

## 2021-04-25 LAB — URINALYSIS, ROUTINE W REFLEX MICROSCOPIC
Bacteria, UA: NONE SEEN
Bilirubin Urine: NEGATIVE
Glucose, UA: 50 mg/dL — AB
Hgb urine dipstick: NEGATIVE
Ketones, ur: NEGATIVE mg/dL
Nitrite: NEGATIVE
Protein, ur: NEGATIVE mg/dL
Specific Gravity, Urine: 1.006 (ref 1.005–1.030)
pH: 8 (ref 5.0–8.0)

## 2021-04-25 LAB — COMPREHENSIVE METABOLIC PANEL
ALT: 14 U/L (ref 0–44)
AST: 12 U/L — ABNORMAL LOW (ref 15–41)
Albumin: 3.2 g/dL — ABNORMAL LOW (ref 3.5–5.0)
Alkaline Phosphatase: 48 U/L (ref 38–126)
Anion gap: 9 (ref 5–15)
BUN: 14 mg/dL (ref 8–23)
CO2: 31 mmol/L (ref 22–32)
Calcium: 8.6 mg/dL — ABNORMAL LOW (ref 8.9–10.3)
Chloride: 101 mmol/L (ref 98–111)
Creatinine, Ser: 1.08 mg/dL — ABNORMAL HIGH (ref 0.44–1.00)
GFR, Estimated: 53 mL/min — ABNORMAL LOW (ref >60)
Glucose, Bld: 97 mg/dL (ref 70–99)
Potassium: 3.8 mmol/L (ref 3.5–5.1)
Sodium: 141 mmol/L (ref 135–145)
Total Bilirubin: 1.2 mg/dL (ref 0.3–1.2)
Total Protein: 5.8 g/dL — ABNORMAL LOW (ref 6.5–8.1)

## 2021-04-25 LAB — TYPE AND SCREEN
ABO/RH(D): A NEG
Antibody Screen: NEGATIVE
Unit division: 0

## 2021-04-25 LAB — ECHOCARDIOGRAM COMPLETE
Height: 60.5 in
S' Lateral: 4.3 cm
Single Plane A4C EF: 43 %
Weight: 1425.05 oz

## 2021-04-25 LAB — BPAM RBC
Blood Product Expiration Date: 202209172359
ISSUE DATE / TIME: 202208262314
Unit Type and Rh: 600

## 2021-04-25 LAB — VITAMIN B12: Vitamin B-12: 195 pg/mL (ref 180–914)

## 2021-04-25 LAB — PROTIME-INR
INR: 0.9 (ref 0.8–1.2)
Prothrombin Time: 12.4 seconds (ref 11.4–15.2)

## 2021-04-25 LAB — SEDIMENTATION RATE: Sed Rate: 17 mm/hr (ref 0–22)

## 2021-04-25 LAB — TSH: TSH: 0.325 u[IU]/mL — ABNORMAL LOW (ref 0.350–4.500)

## 2021-04-25 LAB — CK: Total CK: 26 U/L — ABNORMAL LOW (ref 38–234)

## 2021-04-25 LAB — FOLATE: Folate: 23.9 ng/mL (ref >5.9)

## 2021-04-25 LAB — T4, FREE: Free T4: 1.23 ng/dL — ABNORMAL HIGH (ref 0.61–1.12)

## 2021-04-25 MED ORDER — OXYCODONE HCL 5 MG PO TABS
2.5000 mg | ORAL_TABLET | Freq: Four times a day (QID) | ORAL | Status: DC | PRN
  Administered 2021-04-25 – 2021-05-05 (×2): 2.5 mg via ORAL
  Filled 2021-04-25 (×3): qty 1

## 2021-04-25 MED ORDER — BACLOFEN 10 MG PO TABS
10.0000 mg | ORAL_TABLET | Freq: Two times a day (BID) | ORAL | Status: DC | PRN
  Administered 2021-04-25 – 2021-04-27 (×2): 10 mg via ORAL
  Filled 2021-04-25 (×2): qty 1

## 2021-04-25 MED ORDER — IOHEXOL 350 MG/ML SOLN
75.0000 mL | Freq: Once | INTRAVENOUS | Status: AC | PRN
  Administered 2021-04-25: 75 mL via INTRAVENOUS

## 2021-04-25 MED ORDER — LIDOCAINE 5 % EX PTCH
1.0000 | MEDICATED_PATCH | CUTANEOUS | Status: DC
  Administered 2021-04-25 – 2021-05-04 (×10): 1 via TRANSDERMAL
  Filled 2021-04-25 (×10): qty 1

## 2021-04-25 MED ORDER — OXYCODONE HCL 5 MG PO TABS
5.0000 mg | ORAL_TABLET | Freq: Four times a day (QID) | ORAL | Status: DC | PRN
Start: 2021-04-25 — End: 2021-05-05
  Administered 2021-04-25 – 2021-05-05 (×22): 5 mg via ORAL
  Filled 2021-04-25 (×23): qty 1

## 2021-04-25 MED ORDER — DICLOFENAC SODIUM 1 % EX GEL
2.0000 g | Freq: Four times a day (QID) | CUTANEOUS | Status: DC | PRN
  Administered 2021-04-27 – 2021-05-02 (×4): 2 g via TOPICAL
  Filled 2021-04-25 (×2): qty 100

## 2021-04-25 MED ORDER — FUROSEMIDE 10 MG/ML IJ SOLN: 40.0000 mg | Freq: Once | INTRAMUSCULAR | Status: DC

## 2021-04-25 MED ORDER — TORSEMIDE 20 MG PO TABS
20.0000 mg | ORAL_TABLET | Freq: Every day | ORAL | Status: DC
  Administered 2021-04-26 – 2021-04-30 (×5): 20 mg via ORAL
  Filled 2021-04-25 (×5): qty 1

## 2021-04-25 MED ORDER — IPRATROPIUM-ALBUTEROL 0.5-2.5 (3) MG/3ML IN SOLN
3.0000 mL | Freq: Three times a day (TID) | RESPIRATORY_TRACT | Status: DC
  Administered 2021-04-25 – 2021-04-29 (×13): 3 mL via RESPIRATORY_TRACT
  Filled 2021-04-25 (×16): qty 3
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
## 2021-04-25 NOTE — Progress Notes (Signed)
Echocardiogram 2D Echocardiogram has been performed.  Oneal Deputy Kathleena Freeman RDCS 04/25/2021, 2:26 PM

## 2021-04-25 NOTE — Progress Notes (Signed)
Orthopedic Tech Progress Note Patient Details:  Kimberly Downs Nov 11, 1945 [id number] Patient applied soft collar to herself.  Ortho Devices Type of Ortho Device: Soft collar Ortho Device/Splint Location: Neck      Kolbi Altadonna E Shelbylynn Walczyk 04/25/2021, 7:02 PM

## 2021-04-25 NOTE — Progress Notes (Addendum)
PROGRESS NOTE    Kimberly Downs  EGB:[id number] DOB: 04-09-1946 DOA: 04/24/2021 PCP: Lyman Bishop, DO   Chief Complaint  Patient presents with   Shortness of Breath   Brief Narrative:  Kimberly Downs is Kimberly Downs 75 y.o. female with medical history significant of GI bleed, anemia, atrial fibrillation, bronchiectasis, CHF, C. difficile, COPD, CAD status post stenting, depression, hypertension, ICD placement, PAD, diabetes, restless leg syndrome who presents with ongoing shortness of breath.  Patient states that she has had some dizziness and weakness for about the past week.  She has had some progressive symptoms of shortness of breath and weakness for the past couple days.  States she is been unable to keep her head up due to this weakness which appears to be acute on chronic with her kyphoscoliosis.  She is typically on 2 L of oxygen at baseline.  When her caretaker called 911 and EMS arrived she was saturating around 70% on 2 L and then she improved and placed on Kimberly Downs nonrebreather.  In the ED she has been weaned down to 4 to 5 L.   She denies fevers, chills, chest pain, abdominal pain, constipation, diarrhea, nausea, vomiting. She denies bloody or dark stools   Assessment & Plan:   Principal Problem:   Acute on chronic respiratory failure with hypoxia (HCC) Active Problems:   Anemia associated with acute blood loss - GI Bleed   DM (diabetes mellitus), type 2 with renal complications (HCC)   Essential hypertension   Coronary artery disease involving native coronary artery of native heart with angina pectoris (HCC)   COPD exacerbation (HCC)   Hypercholesterolemia   History of coronary artery stent placement   Chronic combined systolic and diastolic heart failure (HCC)   Paroxysmal atrial fibrillation (HCC)   ICD (implantable cardioverter-defibrillator) in place   Acute GI bleeding   Type 2 diabetes, diet controlled (Kimberly Downs)   PAD (peripheral artery disease) (HCC)  Difficulty Ambulating   Neck Weakness 1.5 weeks - friend notes she last walked 1.5 weeks ago and neck weakness started around then as well.  When head drops with neck in flexed position she has trouble breathing.  This sounds like the reason they presented.  No issues with bowel or bladder, moving all extremities with symmetric strength. Unclear etiology at this point MRI Head/neck pending -> with pacemaker/ICD won't be done until Monday Will get CTA head/neck and CT c spine in the meantime Plain films of neck notable for osteopenia - upper thoracic and L rib fx  CK wnl, TSH low (follow free T4), ESR, CRP, B12 Follow NIFs Discussed informally with neurology -> awaiting imaging to r/o structural cause - will consult with neurology vs neurosurgery based on imaging results  Acute on Chronic Respiratory Failure (on 2 L chronically) 2/2 anemia, HF exacerbation, COPD exacerbation, and mechanical positioning of head/neck Treating as noted below Follow NIFS  Symptomatic Anemia  Iron Deficiency Anemia  GI Bleed  Hemoccult Positive Presented with Hb 6.5 (11.8 07/2020) S/p 1 unit pRBC with improvement GI c/s, planning for endoscopy  Labs c/w iron def anemia.  Follow B12, folate labs. Would benefit from iron transfusion prior to discharge  CHF Exacerbation Severely elevated BNP, clinically doesn't appear significantly overloaded (? If related to anemia or other cause) XR with trace R effusion, no edema Echo with EF 30-35%, moderately decreased function, global hypokinesis (prior echo with EF 25-30% - see report) Will continue IV lasix today, then resume torsemide tomorrow Strict I/O, daily weights  Continue losartan, coreg  COPD Exacerbation on 2 L at home Plan for short steroid course Continue scheduled and prn nebs NIFs as noted above  Osteoporosis  Hx Fragility Fractures Upper thoracic and L rib fx noted on imaging here - osteopenia noted on imaging of cervical spine Recent dexa suggesting  osteoporosis She's on chronic steroids notably   Pyuria Follow culture  CAD PAD - Holding home Plavix and aspirin in the setting of GI bleed as above - Continue home carvedilol and losartan  Hypertension - Continue home carvedilol and losartan - continue loop diuretics   RLS - Continue home ropinirole  Atrial fibrillation - Continue home carvedilol   Diabetes > Diet controlled - SSI   DVT prophylaxis: SCD Code Status: DNR Family Communication: friend over phone Disposition:   Status is: Observation  The patient will require care spanning > 2 midnights and should be moved to inpatient because: Inpatient level of care appropriate due to severity of illness  Dispo: The patient is from: Home              Anticipated d/c is to: Home              Patient currently is not medically stable to d/c.   Difficult to place patient No       Consultants:  GI  Procedures:  Echo IMPRESSIONS     1. Left ventricular ejection fraction, by estimation, is 30 to 35%. The  left ventricle has moderately decreased function. The left ventricle  demonstrates global hypokinesis. Indeterminate diastolic filling due to  E-Francella Barnett fusion.   2. Right ventricular systolic function is normal. The right ventricular  size is normal. There is normal pulmonary artery systolic pressure.   3. Left atrial size was severely dilated.   4. The mitral valve is grossly normal. Trivial mitral valve  regurgitation.   5. The aortic valve was not well visualized. Aortic valve regurgitation  is not visualized. No aortic stenosis is present.   6. The inferior vena cava is normal in size with greater than 50%  respiratory variability, suggesting right atrial pressure of 3 mmHg.   Comparison(s): Kimberly Downs prior study was performed on 06/16/2017. Prior images  reviewed side by side. Similar LV function.   Antimicrobials:  Anti-infectives (From admission, onward)    Start     Dose/Rate Route Frequency Ordered Stop    04/24/21 1530  cefTRIAXone (ROCEPHIN) 1 g in sodium chloride 0.9 % 100 mL IVPB        1 g 200 mL/hr over 30 Minutes Intravenous  Once 04/24/21 1526 04/24/21 1809   04/24/21 1530  azithromycin (ZITHROMAX) 500 mg in sodium chloride 0.9 % 250 mL IVPB        500 mg 250 mL/hr over 60 Minutes Intravenous  Once 04/24/21 1526 04/24/21 1913       Subjective: No new complaints  Objective: Vitals:   04/25/21 0135 04/25/21 0311 04/25/21 0552 04/25/21 0742  BP: 139/71  128/62   Pulse: 73  90   Resp: 18  16   Temp: 98.2 F (36.8 C)  97.6 F (36.4 C)   TempSrc: Oral  Oral   SpO2: 99% 100% 95% 100%  Weight:   40.4 kg   Height:        Intake/Output Summary (Last 24 hours) at 04/25/2021 1502 Last data filed at 04/25/2021 0135 Gross per 24 hour  Intake 565 ml  Output 3000 ml  Net -2435 ml   [employer]  04/24/21 2214 04/25/21 0552  Weight: 42.3 kg 40.4 kg    Examination:  General exam: Appears calm and comfortable  Respiratory system: Clear to auscultation. Respiratory effort normal. Cardiovascular system: S1 & S2 heard, RRR.  Gastrointestinal system: Abdomen is nondistended, soft and nontender. Central nervous system: Alert and oriented. Weakness of neck muscles, able to move in all directions, but not able to stop momentum and keep head from swinging forward to chest or back to bed or to the side.  Able to lift heels off the bed, upper extremity strength symmetric as well.  Denies subjective sensory issues.  Extremities: no LEE, moving all extremities Skin: venous stasis changes to bilateral lower extremities  Psychiatry: Judgement and insight appear normal. Mood & affect appropriate.     Data Reviewed: I have personally reviewed following labs and imaging studies  CBC: Recent Labs  Lab 04/24/21 1527 04/24/21 1538 04/25/21 0345  WBC 8.3  --  8.8  NEUTROABS 6.6  --   --   HGB 6.5* 7.5* 7.7*  HCT 24.5* 22.0* 26.8*  MCV 76.8*  --  75.3*  PLT 544*  --  470*     Basic Metabolic Panel: Recent Labs  Lab 04/24/21 1527 04/24/21 1538 04/24/21 2224 04/25/21 0345  NA 144 142  --  141  K 3.8 3.8  --  3.8  CL 106 103  --  101  CO2 31  --   --  31  GLUCOSE 177* 171*  --  97  BUN 11 10  --  14  CREATININE 1.01* 0.90  --  1.08*  CALCIUM 8.8*  --   --  8.6*  MG  --   --  1.9  --     GFR: Estimated Creatinine Clearance: 28.3 mL/min (Gerhart Ruggieri) (by C-G formula based on SCr of 1.08 mg/dL (H)).  Liver Function Tests: Recent Labs  Lab 04/24/21 1527 04/25/21 0345  AST 15 12*  ALT 14 14  ALKPHOS 49 48  BILITOT 0.6 1.2  PROT 6.0* 5.8*  ALBUMIN 3.4* 3.2*    CBG: Recent Labs  Lab 04/24/21 2223 04/25/21 0750 04/25/21 1110  GLUCAP 370* 127* 127*     Recent Results (from the past 240 hour(s))  Culture, blood (routine x 2)     Status: None (Preliminary result)   Collection Time: 04/24/21  3:27 PM   Specimen: BLOOD RIGHT ARM  Result Value Ref Range Status   Specimen Description BLOOD RIGHT ARM  Final   Special Requests   Final    BOTTLES DRAWN AEROBIC AND ANAEROBIC Blood Culture results may not be optimal due to an inadequate volume of blood received in culture bottles   Culture   Final    NO GROWTH < 24 HOURS Performed at [facility], [place] [address] [place], [place] [address]    Report Status PENDING  Incomplete  Resp Panel by RT-PCR (Flu Adelynn Gipe&B, Covid) Nasopharyngeal Swab     Status: None   Collection Time: 04/24/21  3:39 PM   Specimen: Nasopharyngeal Swab; Nasopharyngeal(NP) swabs in vial transport medium  Result Value Ref Range Status   SARS Coronavirus 2 by RT PCR NEGATIVE NEGATIVE Final    Comment: (NOTE) SARS-CoV-2 target nucleic acids are NOT DETECTED.  The SARS-CoV-2 RNA is generally detectable in upper respiratory specimens during the acute phase of infection. The lowest concentration of SARS-CoV-2 viral copies this assay can detect is 138 copies/mL. Marquise Wicke negative result does not preclude SARS-Cov-2 infection and should not  be  used as the sole basis for treatment or other patient management decisions. Kimberly Downs negative result may occur with  improper specimen collection/handling, submission of specimen other than nasopharyngeal swab, presence of viral mutation(s) within the areas targeted by this assay, and inadequate number of viral copies(<138 copies/mL). Kimberly Downs negative result must be combined with clinical observations, patient history, and epidemiological information. The expected result is Negative.  Fact Sheet for Patients:  EntrepreneurPulse.com.au  Fact Sheet for Healthcare Providers:  IncredibleEmployment.be  This test is no t yet approved or cleared by the [place] FDA and  has been authorized for detection and/or diagnosis of SARS-CoV-2 by FDA under an Emergency Use Authorization (EUA). This EUA will remain  in effect (meaning this test can be used) for the duration of the COVID-19 declaration under Section 564(b)(1) of the Act, 21 U.S.C.section 360bbb-3(b)(1), unless the authorization is terminated  or revoked sooner.       Influenza Kimberly Downs by PCR NEGATIVE NEGATIVE Final   Influenza B by PCR NEGATIVE NEGATIVE Final    Comment: (NOTE) The Xpert Xpress SARS-CoV-2/FLU/RSV plus assay is intended as an aid in the diagnosis of influenza from Nasopharyngeal swab specimens and should not be used as Kimberly Downs sole basis for treatment. Nasal washings and aspirates are unacceptable for Xpert Xpress SARS-CoV-2/FLU/RSV testing.  Fact Sheet for Patients: EntrepreneurPulse.com.au  Fact Sheet for Healthcare Providers: IncredibleEmployment.be  This test is not yet approved or cleared by the [place] FDA and has been authorized for detection and/or diagnosis of SARS-CoV-2 by FDA under an Emergency Use Authorization (EUA). This EUA will remain in effect (meaning this test can be used) for the duration of the COVID-19 declaration under Section  564(b)(1) of the Act, 21 U.S.C. section 360bbb-3(b)(1), unless the authorization is terminated or revoked.  Performed at [facility], [place] [address], [place], [place] [address]   Culture, blood (routine x 2)     Status: None (Preliminary result)   Collection Time: 04/24/21 10:24 PM   Specimen: BLOOD  Result Value Ref Range Status   Specimen Description BLOOD LEFT ANTECUBITAL  Final   Special Requests AEROBIC BOTTLE ONLY Blood Culture adequate volume  Final   Culture   Final    NO GROWTH < 24 HOURS Performed at [facility], [place] [address] [place], [place] [address]    Report Status PENDING  Incomplete         Radiology Studies: DG Cervical Spine 2 or 3 views  Result Date: 04/25/2021 CLINICAL DATA:  Pain EXAM: CERVICAL SPINE - 2-3 VIEW COMPARISON:  CT thoracic spine 10/30/2020 FINDINGS: osteopenia. The cervicothoracic junction including the C7 fracture demonstrated on prior study is poorly visualized. Upper thoracic compression deformities are again demonstrated. Changes of cement augmentation in the lower thoracic spine. Subacute left rib fractures again noted. Aortic Atherosclerosis (ICD10-170.0). Bilateral calcified carotid arterial plaque. Left subclavian transvenous pacing leads partially visualized. IMPRESSION: 1. Osteopenia.  Incomplete evaluation of lower cervical spine. 2. Upper thoracic and left rib fractures, as previously described. Electronically Signed   By: Kimberly Downs M.D.   On: 04/25/2021 14:11   DG Chest Port 1 View  Result Date: 04/24/2021 CLINICAL DATA:  Shortness of breath EXAM: PORTABLE CHEST 1 VIEW COMPARISON:  Chest radiograph 08/05/2020 FINDINGS: There is Hurley Sobel left chest wall cardiac device with Kimberly Downs single lead projecting over the right ventricle. The heart is enlarged, accentuated by AP technique. The mediastinal contours are within normal limits. There is calcified atherosclerotic plaque of the aortic arch.  The lungs are hyperlucent with flattening of  the diaphragms suggesting COPD. Linear opacities in the right lung base likely reflect scarring and/or atelectasis. There is no focal consolidation or pulmonary edema. There is Kimberly Downs small right pleural effusion. There is no significant left pleural effusion. There is no pneumothorax. There is Kimberly Downs displaced fracture of the posterior left fifth rib which may be acute. Prior vertebral augmentation in the thoracic spine is noted. IMPRESSION: 1. Trace right pleural effusion with adjacent atelectasis. 2. Cardiomegaly and suspected COPD. 3. Displaced fracture of the posterior left fifth rib which may be acute. Correlate with history and tenderness, and CT may be considered as indicated. Electronically Signed   By: Valetta Mole M.D.   On: 04/24/2021 16:26        Scheduled Meds:  carvedilol  6.25 mg Oral BID WC   furosemide  40 mg Intravenous Q12H   insulin aspart  0-5 Units Subcutaneous QHS   insulin aspart  0-9 Units Subcutaneous TID WC   ipratropium-albuterol  3 mL Nebulization TID   losartan  50 mg Oral QPM   mometasone-formoterol  2 puff Inhalation BID   pantoprazole  40 mg Oral Daily   predniSONE  40 mg Oral Q breakfast   sodium chloride flush  3 mL Intravenous Q12H   Continuous Infusions:  sodium chloride       LOS: 0 days    Time spent: over 30 min    Kimberly Helper, MD [facility]   To contact the attending provider between 7A-7P or the covering provider during after hours 7P-7A, please log into the web site [email or web address] and access using universal Ava password for that web site. If you do not have the password, please call the hospital operator.  04/25/2021, 3:02 PM

## 2021-04-25 NOTE — Progress Notes (Signed)
Respiratory Note  NIF performed.  Best effort -20cm H20.

## 2021-04-25 NOTE — Progress Notes (Signed)
Date and time results received: 04/24/21 2300 Test: Lactic acid Critical Value: 2 Name of Provider Notified: Neva Seat Orders Received? Or Actions Taken?:  MD notified  and will continue to monitor pt.

## 2021-04-26 ENCOUNTER — Encounter (HOSPITAL_COMMUNITY): Payer: Self-pay | Admitting: Family Medicine

## 2021-04-26 ENCOUNTER — Inpatient Hospital Stay (HOSPITAL_COMMUNITY): Payer: Medicare Other | Admitting: Certified Registered Nurse Anesthetist

## 2021-04-26 ENCOUNTER — Encounter (HOSPITAL_COMMUNITY)
Admission: EM | Disposition: A | Discharge status: Home Health | Payer: Self-pay | Source: Home / Self Care | Attending: Internal Medicine

## 2021-04-26 DIAGNOSIS — R531 Weakness: Secondary | ICD-10-CM

## 2021-04-26 LAB — COMPREHENSIVE METABOLIC PANEL
ALT: 12 U/L (ref 0–44)
AST: 14 U/L — ABNORMAL LOW (ref 15–41)
Albumin: 3 g/dL — ABNORMAL LOW (ref 3.5–5.0)
Alkaline Phosphatase: 45 U/L (ref 38–126)
Anion gap: 10 (ref 5–15)
BUN: 22 mg/dL (ref 8–23)
CO2: 30 mmol/L (ref 22–32)
Calcium: 8.3 mg/dL — ABNORMAL LOW (ref 8.9–10.3)
Chloride: 97 mmol/L — ABNORMAL LOW (ref 98–111)
Creatinine, Ser: 1.16 mg/dL — ABNORMAL HIGH (ref 0.44–1.00)
GFR, Estimated: 49 mL/min — ABNORMAL LOW (ref >60)
Glucose, Bld: 390 mg/dL — ABNORMAL HIGH (ref 70–99)
Potassium: 3.6 mmol/L (ref 3.5–5.1)
Sodium: 137 mmol/L (ref 135–145)
Total Bilirubin: 0.5 mg/dL (ref 0.3–1.2)
Total Protein: 5.5 g/dL — ABNORMAL LOW (ref 6.5–8.1)

## 2021-04-26 LAB — CBC
HCT: 27.9 % — ABNORMAL LOW (ref 36.0–46.0)
Hemoglobin: 8 g/dL — ABNORMAL LOW (ref 12.0–15.0)
MCH: 21.7 pg — ABNORMAL LOW (ref 26.0–34.0)
MCHC: 28.7 g/dL — ABNORMAL LOW (ref 30.0–36.0)
MCV: 75.8 fL — ABNORMAL LOW (ref 80.0–100.0)
Platelets: 509 10*3/uL — ABNORMAL HIGH (ref 150–400)
RBC: 3.68 MIL/uL — ABNORMAL LOW (ref 3.87–5.11)
RDW: 16.5 % — ABNORMAL HIGH (ref 11.5–15.5)
WBC: 9.3 10*3/uL (ref 4.0–10.5)
nRBC: 0 % (ref 0.0–0.2)

## 2021-04-26 LAB — URINE CULTURE: Culture: NO GROWTH

## 2021-04-26 LAB — GLUCOSE, CAPILLARY
Glucose-Capillary: 89 mg/dL (ref 70–99)
Glucose-Capillary: 205 mg/dL — ABNORMAL HIGH (ref 70–99)
Glucose-Capillary: 347 mg/dL — ABNORMAL HIGH (ref 70–99)
Glucose-Capillary: 154 mg/dL — ABNORMAL HIGH (ref 70–99)

## 2021-04-26 SURGERY — CANCELLED PROCEDURE
Anesthesia: Monitor Anesthesia Care

## 2021-04-26 MED ORDER — SODIUM CHLORIDE 0.9 % IV SOLN: INTRAVENOUS | Status: DC

## 2021-04-26 MED ORDER — PREDNISONE 10 MG PO TABS: 10.0000 mg | ORAL_TABLET | Freq: Every day | ORAL | Status: DC

## 2021-04-26 SURGICAL SUPPLY — 14 items

## 2021-04-26 NOTE — Progress Notes (Signed)
Per hospitalist and Benson Norway, MD procedure upper endoscopy should be cancelled due to neurological concerns. Will return patient to [place] [facility].

## 2021-04-26 NOTE — Anesthesia Preprocedure Evaluation (Deleted)
Anesthesia Evaluation  Patient identified by MRN, date of birth, ID band Patient awake    Reviewed: Allergy & Precautions, NPO status , Patient's Chart, lab work & pertinent test results, reviewed documented beta blocker date and time   Airway Mallampati: II  TM Distance: >3 FB     Dental  (+) Edentulous Upper, Edentulous Lower   Pulmonary shortness of breath, COPD, Current Smoker, former smoker,    breath sounds clear to auscultation       Cardiovascular hypertension, Pt. on home beta blockers and Pt. on medications + angina + CAD, + Peripheral Vascular Disease and +CHF  + Cardiac Defibrillator  Rhythm:Regular Rate:Normal  Echo 04/25/2021 1. Left ventricular ejection fraction, by estimation, is 30 to 35%. The left ventricle has moderately decreased function. The left ventricle demonstrates global hypokinesis. Indeterminate diastolic filling due to  E-A fusion.  2. Right ventricular systolic function is normal. The right ventricular size is normal. There is normal pulmonary artery systolic pressure.  3. Left atrial size was severely dilated.  4. The mitral valve is grossly normal. Trivial mitral valve  regurgitation.  5. The aortic valve was not well visualized. Aortic valve regurgitation is not visualized. No aortic stenosis is present.  6. The inferior vena cava is normal in size with greater than 50% respiratory variability, suggesting right atrial pressure of 3 mmHg.    AICD 100% VS   Neuro/Psych PSYCHIATRIC DISORDERS Anxiety Depression Dementia    GI/Hepatic PUD,   Endo/Other  diabetes  Renal/GU Renal disease     Musculoskeletal   Abdominal   Peds  Hematology  (+) anemia ,   Anesthesia Other Findings   Reproductive/Obstetrics                            Anesthesia Physical  Anesthesia Plan  ASA: 4  Anesthesia Plan: MAC   Post-op Pain Management:    Induction:  Intravenous  PONV Risk Score and Plan: 2 and Ondansetron, Propofol infusion and Treatment may vary due to age  Airway Management Planned: Nasal Cannula  Additional Equipment:   Intra-op Plan:   Post-operative Plan:   Informed Consent: I have reviewed the patients History and Physical, chart, labs and discussed the procedure including the risks, benefits and alternatives for the proposed anesthesia with the patient or authorized representative who has indicated his/her understanding and acceptance.   Patient has DNR.  Suspend DNR and Discussed DNR with patient.     Plan Discussed with: CRNA  Anesthesia Plan Comments:        Anesthesia Quick Evaluation

## 2021-04-26 NOTE — Progress Notes (Signed)
Subjective: No complaints.  Objective: Vital signs in last 24 hours: Temp:  [97.4 F (36.3 C)-98.7 F (37.1 C)] 97.4 F (36.3 C) (08/28 0656) Pulse Rate:  [59-84] 59 (08/28 0656) Resp:  [16-17] 16 (08/28 0656) BP: (147-181)/(7-94) 148/71 (08/28 0815) SpO2:  [95 %-100 %] 99 % (08/28 0808) Weight:  [42.5 kg] 42.5 kg (08/28 0441) Last BM Date: 04/21/21  Intake/Output from previous day: No intake/output data recorded. Intake/Output this shift: No intake/output data recorded.  General appearance: alert and no distress GI: soft, non-tender; bowel sounds normal; no masses,  no organomegaly  Lab Results: Recent Labs    04/24/21 1527 04/24/21 1538 04/25/21 0345  WBC 8.3  --  8.8  HGB 6.5* 7.5* 7.7*  HCT 24.5* 22.0* 26.8*  PLT 544*  --  470*   BMET Recent Labs    04/24/21 1527 04/24/21 1538 04/25/21 0345  NA 144 142 141  K 3.8 3.8 3.8  CL 106 103 101  CO2 31  --  31  GLUCOSE 177* 171* 97  BUN 11 10 14   CREATININE 1.01* 0.90 1.08*  CALCIUM 8.8*  --  8.6*   LFT Recent Labs    04/25/21 0345  PROT 5.8*  ALBUMIN 3.2*  AST 12*  ALT 14  ALKPHOS 48  BILITOT 1.2   PT/INR Recent Labs    04/24/21 1527 04/25/21 0345  LABPROT 12.5 12.4  INR 0.9 0.9   Hepatitis Panel No results for input(s): HEPBSAG, HCVAB, HEPAIGM, HEPBIGM in the last 72 hours. C-Diff No results for input(s): CDIFFTOX in the last 72 hours. Fecal Lactopherrin No results for input(s): FECLLACTOFRN in the last 72 hours.  Studies/Results: CT ANGIO HEAD NECK W WO CM  Result Date: 04/26/2021 CLINICAL DATA:  Initial evaluation for neck weakness, difficulty with ambulation. EXAM: CT ANGIOGRAPHY HEAD AND NECK TECHNIQUE: Multidetector CT imaging of the head and neck was performed using the standard protocol during bolus administration of intravenous contrast. Multiplanar CT image reconstructions and MIPs were obtained to evaluate the vascular anatomy. Carotid stenosis measurements (when applicable) are  obtained utilizing NASCET criteria, using the distal internal carotid diameter as the denominator. CONTRAST:  47mL OMNIPAQUE IOHEXOL 350 MG/ML SOLN COMPARISON:  Prior CT from 10/30/2020.  As well as earlier studies. FINDINGS: CT HEAD FINDINGS Brain: Generalized age-related cerebral atrophy with moderately advanced chronic microvascular ischemic disease. No acute intracranial hemorrhage. No acute large vessel territory infarct. No mass lesion, mass effect or midline shift. No hydrocephalus or extra-axial fluid collection. Vascular: No hyperdense vessel. Calcified atherosclerosis present at skull base. Skull: Scalp soft tissues demonstrate no acute finding. Calvarium intact. 7 mm sclerotic lesion at the right frontal bone, stable as compared to multiple previous studies dating back to at least 2017, consistent with a benign finding. Sinuses: Visualized paranasal sinuses are clear. Small chronic appearing left mastoid effusion. Orbits: Globes and orbital soft tissues demonstrate no acute finding. Review of the MIP images confirms the above findings CTA NECK FINDINGS Aortic arch: Visualized aortic arch normal in caliber with normal 3 vessel morphology. Advanced atheromatous change seen throughout the visualized aorta and about the origin of the great vessels, but with no significant stenosis. Right carotid system: Left CCA tortuous but patent without significant stenosis. Extensive atheromatous change about the right carotid bulb/proximal right ICA with associated stenosis of up to 75% by NASCET criteria. Right ICA tortuous but is otherwise patent to the skull base without stenosis, dissection or occlusion. Left carotid system: Left CCA tortuous but is widely patent to  the bifurcation. Scattered calcified plaque about the left carotid bulb/proximal left ICA without significant stenosis. Left ICA tortuous but is widely patent without stenosis, dissection or occlusion. Vertebral arteries: Both vertebral arteries arise  from subclavian arteries. No hemodynamically significant proximal subclavian artery stenosis. Strongly dominant right vertebral artery, with a diffusely hypoplastic left vertebral artery. Vertebral arteries are diffusely tortuous with scattered atheromatous irregularity, but no high-grade stenosis or other acute vascular abnormality. Skeleton: Compression deformities involving the T2 and T3 vertebral bodies noted, severe at T3. Focal exaggeration of the normal upper thoracic kyphosis. No other discrete or worrisome osseous lesions. Patient is edentulous. Other neck: No other visible acute soft tissue abnormality within the neck. No mass or adenopathy. Upper chest: Left-sided pacemaker/AICD in place. Multiple remote bilateral rib fractures noted. Small layering bilateral pleural effusions, left greater than right, partially visualized. Emphysema. Review of the MIP images confirms the above findings CTA HEAD FINDINGS Anterior circulation: Scattered atheromatous change throughout the petrous, cavernous, and supraclinoid segments without flow-limiting stenosis. 3 mm focal outpouching arising from the supraclinoid left ICA favored to reflect a small vascular infundibulum, although possible aneurysm could also be considered (series 8, image 187). A1 segments patent bilaterally. Normal anterior communicating artery complex. Anterior cerebral arteries patent to their distal aspects without stenosis. No M1 stenosis or occlusion. Normal MCA bifurcations. Distal MCA branches well perfused and symmetric. Posterior circulation: Dominant right vertebral artery patent to the vertebrobasilar junction. Hypoplastic left vertebral artery largely terminates in PICA. Left PICA patent. Right PICA not well seen. Basilar patent to its distal aspect without stenosis. Superior cerebral arteries patent bilaterally. Both PCAs primarily supplied via the basilar well perfused to their distal aspects. Venous sinuses: Patent allowing for timing  the contrast bolus. Anatomic variants: Hypoplastic left vertebral artery terminates in PICA. Review of the MIP images confirms the above findings IMPRESSION: CT HEAD IMPRESSION: 1. No acute intracranial abnormality. 2. Age-related cerebral atrophy with moderately advanced chronic microvascular ischemic disease. CTA HEAD AND NECK IMPRESSION: 1. Negative CTA for large vessel occlusion. 2. Extensive atheromatous change about the right carotid bulb/proximal right ICA with associated stenosis of up to 75% by NASCET criteria. 3. Additional moderate to advanced atheromatous change elsewhere about the major arterial vasculature of the neck, but no other proximal high-grade or correctable stenosis. 4. 3 mm focal outpouching arising from the supraclinoid left ICA, favored to reflect a small vascular infundibulum, although aneurysm not excluded. Attention at follow-up recommended. 5. Compression deformities involving the T2 and T3 vertebral bodies, severe at T3. Correlation with physical exam recommended. Multiple additional subacute to chronic bilateral rib fractures. 6. Small layering bilateral pleural effusions, left greater than right, partially visualized. 7. Emphysema (ICD10-J43.9). Electronically Signed   By: Jeannine Boga M.D.   On: 04/26/2021 00:38   DG Cervical Spine 2 or 3 views  Result Date: 04/25/2021 CLINICAL DATA:  Pain EXAM: CERVICAL SPINE - 2-3 VIEW COMPARISON:  CT thoracic spine 10/30/2020 FINDINGS: osteopenia. The cervicothoracic junction including the C7 fracture demonstrated on prior study is poorly visualized. Upper thoracic compression deformities are again demonstrated. Changes of cement augmentation in the lower thoracic spine. Subacute left rib fractures again noted. Aortic Atherosclerosis (ICD10-170.0). Bilateral calcified carotid arterial plaque. Left subclavian transvenous pacing leads partially visualized. IMPRESSION: 1. Osteopenia.  Incomplete evaluation of lower cervical spine. 2.  Upper thoracic and left rib fractures, as previously described. Electronically Signed   By: Lucrezia Europe M.D.   On: 04/25/2021 14:11   CT C-SPINE NO CHARGE  Result Date:  04/25/2021 CLINICAL DATA:  Neck weakness.  Difficulty ambulating. EXAM: CT CERVICAL SPINE WITHOUT CONTRAST TECHNIQUE: Multidetector CT imaging of the cervical spine was performed without intravenous contrast. Multiplanar CT image reconstructions were also generated. COMPARISON:  X-ray cervical spine 04/25/2021, CT thoracic spine 10/30/2020 FINDINGS: Alignment: Normal. Skull base and vertebrae: No acute cervical spine fracture. No aggressive appearing focal osseous lesion or focal pathologic process. Chronic anterior wedge compression fracture with greater than 95% height loss of the T3 vertebral body. Soft tissues and spinal canal: No prevertebral fluid or swelling. No visible canal hematoma. Upper chest: Emphysematous changes. No pneumothorax at the apices. Atherosclerotic plaque of the aortic arch. Other: Redemonstration of subacute left rib fractures. Vasculature and intracranial: Please see separately dictated CT angiography head and neck 04/25/2021. IMPRESSION: 1. No acute displaced fracture or traumatic listhesis of the cervical spine. 2. Chronic anterior wedge compression fracture with greater than 95% height loss of the T3 vertebral body. 3. Please see separately dictated CT angiography head and neck 04/25/2021. 4. Redemonstration of subacute left rib fractures. 5. Aortic Atherosclerosis (ICD10-I70.0) and Emphysema (ICD10-J43.9). Electronically Signed   By: Iven Finn M.D.   On: 04/25/2021 20:04   DG Chest Port 1 View  Result Date: 04/24/2021 CLINICAL DATA:  Shortness of breath EXAM: PORTABLE CHEST 1 VIEW COMPARISON:  Chest radiograph 08/05/2020 FINDINGS: There is a left chest wall cardiac device with a single lead projecting over the right ventricle. The heart is enlarged, accentuated by AP technique. The mediastinal contours are  within normal limits. There is calcified atherosclerotic plaque of the aortic arch. The lungs are hyperlucent with flattening of the diaphragms suggesting COPD. Linear opacities in the right lung base likely reflect scarring and/or atelectasis. There is no focal consolidation or pulmonary edema. There is a small right pleural effusion. There is no significant left pleural effusion. There is no pneumothorax. There is a displaced fracture of the posterior left fifth rib which may be acute. Prior vertebral augmentation in the thoracic spine is noted. IMPRESSION: 1. Trace right pleural effusion with adjacent atelectasis. 2. Cardiomegaly and suspected COPD. 3. Displaced fracture of the posterior left fifth rib which may be acute. Correlate with history and tenderness, and CT may be considered as indicated. Electronically Signed   By: Valetta Mole M.D.   On: 04/24/2021 16:26   ECHOCARDIOGRAM COMPLETE  Result Date: 04/25/2021    ECHOCARDIOGRAM REPORT   Patient Name:   Kimberly Downs Date of Exam: 04/25/2021 Medical Rec #:  [id number]     Height:       60.5 in Accession #:    [id number]    Weight:       89.1 lb Date of Birth:  Jan 17, 1946     BSA:          1.332 m Patient Age:    75 years      BP:           128/62 mmHg Patient Gender: F             HR:           70 bpm. Exam Location:  Inpatient Procedure: 2D Echo, Color Doppler and Cardiac Doppler Indications:    R06.9 DOE  History:        Patient has prior history of Echocardiogram examinations, most                 recent 06/16/2017. CHF, Defibrillator, COPD, Arrythmias:Atrial  Fibrillation; Risk Factors:Hypertension, Diabetes and                 Dyslipidemia.  Sonographer:    Raquel Sarna Senior RDCS Referring Phys: [id number] Candace Gallus MELVIN  Sonographer Comments: Very poor echo windows due to body habitus and COPD. Scanned hunched over/upright, unable to reposition due to C-spine concerns per RN. IMPRESSIONS  1. Left ventricular ejection fraction, by  estimation, is 30 to 35%. The left ventricle has moderately decreased function. The left ventricle demonstrates global hypokinesis. Indeterminate diastolic filling due to E-A fusion.  2. Right ventricular systolic function is normal. The right ventricular size is normal. There is normal pulmonary artery systolic pressure.  3. Left atrial size was severely dilated.  4. The mitral valve is grossly normal. Trivial mitral valve regurgitation.  5. The aortic valve was not well visualized. Aortic valve regurgitation is not visualized. No aortic stenosis is present.  6. The inferior vena cava is normal in size with greater than 50% respiratory variability, suggesting right atrial pressure of 3 mmHg. Comparison(s): A prior study was performed on 06/16/2017. Prior images reviewed side by side. Similar LV function. FINDINGS  Left Ventricle: Left ventricular ejection fraction, by estimation, is 30 to 35%. The left ventricle has moderately decreased function. The left ventricle demonstrates global hypokinesis. The left ventricular internal cavity size was normal in size. There is no left ventricular hypertrophy. Indeterminate diastolic filling due to E-A fusion. Right Ventricle: The right ventricular size is normal. No increase in right ventricular wall thickness. Right ventricular systolic function is normal. There is normal pulmonary artery systolic pressure. The tricuspid regurgitant velocity is 1.99 m/s, and  with an assumed right atrial pressure of 3 mmHg, the estimated right ventricular systolic pressure is 86.7 mmHg. Left Atrium: Left atrial size was severely dilated. Right Atrium: Right atrial size was normal in size. Pericardium: Trivial pericardial effusion is present. Mitral Valve: The mitral valve is grossly normal. Mild mitral annular calcification. Trivial mitral valve regurgitation. Tricuspid Valve: The tricuspid valve is grossly normal. Tricuspid valve regurgitation is trivial. No evidence of tricuspid stenosis.  Aortic Valve: The aortic valve was not well visualized. Aortic valve regurgitation is not visualized. No aortic stenosis is present. Pulmonic Valve: The pulmonic valve was not well visualized. Pulmonic valve regurgitation is not visualized. No evidence of pulmonic stenosis. Aorta: The aortic root is normal in size and structure. Venous: The inferior vena cava is normal in size with greater than 50% respiratory variability, suggesting right atrial pressure of 3 mmHg. IAS/Shunts: The atrial septum is grossly normal. Additional Comments: A device lead is visualized.  LEFT VENTRICLE PLAX 2D LVIDd:         5.20 cm      Diastology LVIDs:         4.30 cm      LV e' medial:  3.37 cm/s LV PW:         0.70 cm      LV e' lateral: 5.77 cm/s LV IVS:        0.70 cm LVOT diam:     2.30 cm LV SV:         57 LV SV Index:   42 LVOT Area:     4.15 cm  LV Volumes (MOD) LV vol d, MOD A4C: 138.0 ml LV vol s, MOD A4C: 78.7 ml LV SV MOD A4C:     138.0 ml RIGHT VENTRICLE RV S prime:     10.20 cm/s TAPSE (M-mode): 1.8 cm LEFT ATRIUM  Index       RIGHT ATRIUM           Index LA diam:      3.60 cm 2.70 cm/m  RA Area:     14.60 cm LA Vol (A4C): 94.1 ml 70.66 ml/m RA Volume:   37.30 ml  28.01 ml/m  AORTIC VALVE LVOT Vmax:   65.30 cm/s LVOT Vmean:  46.800 cm/s LVOT VTI:    0.136 m  AORTA Ao Root diam: 3.20 cm TRICUSPID VALVE TR Peak grad:   15.8 mmHg TR Vmax:        199.00 cm/s  SHUNTS Systemic VTI:  0.14 m Systemic Diam: 2.30 cm Rudean Haskell MD Electronically signed by Rudean Haskell MD Signature Date/Time: 04/25/2021/3:09:20 PM    Final     Medications: Scheduled:  carvedilol  6.25 mg Oral BID WC   insulin aspart  0-5 Units Subcutaneous QHS   insulin aspart  0-9 Units Subcutaneous TID WC   ipratropium-albuterol  3 mL Nebulization TID   lidocaine  1 patch Transdermal Q24H   losartan  50 mg Oral QPM   mometasone-formoterol  2 puff Inhalation BID   pantoprazole  40 mg Oral Daily   predniSONE  40 mg Oral Q  breakfast   sodium chloride flush  3 mL Intravenous Q12H   torsemide  20 mg Oral Daily   Continuous:  sodium chloride      Assessment/Plan: 1) History of a GI bleed from a Dieulafoy's lesion. 2) Anemia - stable. 3) Neck weakness.   The patient's EGD was cancelled per Dr. Abel Presto request.  He was concerned about a neurologic issue with her acute onset neck weakness.  This is a reasonable course of action as she is currently not bleeding, overtly.  She is hemodynamically stable.   Plan: 1) Await clearance from Dr. Florene Glen and/or Neurology. 2) [facility] to assume care in the AM.  LOS: 1 day   Ariele Vidrio D 04/26/2021, 12:02 PM

## 2021-04-26 NOTE — Consult Note (Addendum)
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           Index LA diam:      3.60 cm 2.70 cm/m  RA Area:     14.60 cm LA Vol (A4C): 94.1 ml 70.66 ml/m RA Volume:   37.30 ml  28.01 ml/m  AORTIC VALVE LVOT Vmax:   65.30 cm/s LVOT Vmean:  46.800 cm/s LVOT VTI:    0.136 m  AORTA Ao Root diam: 3.20 cm TRICUSPID VALVE TR Peak grad:   15.8 mmHg TR Vmax:        199.00 cm/s  SHUNTS Systemic VTI:  0.14 m Systemic Diam: 2.30 cm Rudean Haskell MD Electronically signed by Rudean Haskell MD Signature Date/Time: 04/25/2021/3:09:20 PM    Final      Assessment: 75 year old female with a 2 week history of diffuse weakness with intermittent double vision and acute attacks of neck weakness.  1. Exam reveals moderate weakness of neck extensors, mild weakness of neck flexion and head rotation to left and right. Also with fatiguability with sustained upgaze resulting in double vision, weakness of left eye supraduction and left sided ptosis. Limb strength is preserved in the context of her diffusely decreased muscle bulk.  2. Most likely etiology for her weakness, given her history, symptoms and  exam findings, is an acquired disorder of the neuromuscular junction, such as myasthenia gravis or Lambert-Eaton myasthenic syndrome (aka LEMS, which is a concern given her 20 lb weight loss over the past 4 months).  3. Multiple rib fractures on  imaging. May have contributed to the acute worsening of her progressive SOB at home.    Recommendations: 1. CT of chest, abdomen and pelvis to assess for malignancy 2. Myasthenia gravis panel has been ordered.  3. Anti-voltage-gated calcium channel antibody titer (LEMS) has been ordered. 4. RT to assess NIF and FVC 5. Discontinue Baclofen, which can contribute to her weakness.  6. Outpatient Vascular Surgery follow up regarding CTA finding of extensive atheromatous change about the right carotid bulb/proximal right ICA with associated stenosis of up to 75% by NASCET criteria. This finding is not felt to be related to her current presentation.  7. Further recommendations pending the results of the above tests.  8. Will need outpatient Neurology follow up. At that time may benefit from EMG/NCS of the neck musculature.    Electronically signed: Dr. Kerney Elbe 04/26/2021, 8:14 PM

## 2021-04-26 NOTE — Progress Notes (Addendum)
PROGRESS NOTE    Kimberly Downs  UUV:[id number] DOB: [birth year] DOA: 04/24/2021 PCP: Lyman Bishop, DO   Chief Complaint  Patient presents with   Shortness of Breath   Brief Narrative:  Kimberly Downs is Kimberly Downs 75 y.o. female with medical history significant of GI bleed, anemia, atrial fibrillation, bronchiectasis, CHF, C. difficile, COPD, CAD status post stenting, depression, hypertension, ICD placement, PAD, diabetes, restless leg syndrome who presents with ongoing shortness of breath.  Patient states that she has had some dizziness and weakness for about the past week.  She has had some progressive symptoms of shortness of breath and weakness for the past couple days.  States she is been unable to keep her head up due to this weakness which appears to be acute on chronic with her kyphoscoliosis.  She is typically on 2 L of oxygen at baseline.  When her caretaker called 911 and EMS arrived she was saturating around 70% on 2 L and then she improved and placed on Deklen Popelka nonrebreather.  In the ED she has been weaned down to 4 to 5 L.   She denies fevers, chills, chest pain, abdominal pain, constipation, diarrhea, nausea, vomiting. She denies bloody or dark stools   Assessment & Plan:   Principal Problem:   Acute on chronic respiratory failure with hypoxia (HCC) Active Problems:   Anemia associated with acute blood loss - GI Bleed   DM (diabetes mellitus), type 2 with renal complications (HCC)   Essential hypertension   Coronary artery disease involving native coronary artery of native heart with angina pectoris (HCC)   COPD exacerbation (HCC)   Hypercholesterolemia   History of coronary artery stent placement   Chronic combined systolic and diastolic heart failure (HCC)   Paroxysmal atrial fibrillation (HCC)   ICD (implantable cardioverter-defibrillator) in place   Acute GI bleeding   Type 2 diabetes, diet controlled (HCC)   PAD (peripheral artery disease) (HCC)   Symptomatic  anemia  Difficulty Ambulating  Neck Weakness 1.5 weeks - friend notes she last walked 1.5 weeks ago and neck weakness started around then as well.  When head drops with neck in flexed position she has trouble breathing.  This sounds like the reason they presented.  No issues with bowel or bladder, moving all extremities with symmetric strength. Unclear etiology at this point -> concern for myasthenia - she notes ptosis, double vision, respiratory issues, generalized weakness, waxes and wanes - no clear worsening throughout the day - today noted to have intermittent issues with lateral gaze with L eye with her EOM and c/o double vision as well Neurology has been c/s Acetylcholine receptor ab  MRI Head/neck pending -> with pacemaker/ICD won't be done until Monday Will get CTA head/neck and CT c spine in the meantime as noted below Plain films of neck notable for osteopenia - upper thoracic and L rib fx  CK wnl, TSH low (follow free T4), ESR wnl, CRP wnl, B12 wnl Follow NIFs CTA head/neck without LVO, no acute intracranial abnormality - CT c spine without acute displaced fx or traumatic listhesis  Acute on Chronic Respiratory Failure (on 2 L chronically) 2/2 anemia, HF exacerbation, COPD exacerbation, and mechanical positioning of head/neck and generalized weakness above Treating as noted below Follow NIFS  Symptomatic Anemia  Iron Deficiency Anemia  GI Bleed  Hemoccult Positive Presented with Hb 6.5 (11.8 07/2020) S/p 1 unit pRBC with improvement GI c/s, planning for endoscopy requested hold off until weakness above better understood Labs c/w  iron def anemia.  Follow B12, folate labs. Would benefit from iron transfusion prior to discharge  CHF Exacerbation Severely elevated BNP, clinically doesn't appear significantly overloaded (? If related to anemia or other cause) XR with trace R effusion, no edema Echo with EF 30-35%, moderately decreased function, global hypokinesis (prior echo  with EF 25-30% - see report) Continue torsemide Strict I/O, daily weights Continue losartan, coreg  COPD Exacerbation on 2 L at home Plan for short steroid course Continue scheduled and prn nebs NIFs as noted above  Osteoporosis  Hx Fragility Fractures Upper thoracic and L rib fx noted on imaging here - osteopenia noted on imaging of cervical spine Compression deformities of T2/T3, severe at T3, subacute to chronic bilateral rib fx Recent dexa suggesting osteoporosis She's on chronic steroids notably   Right Carotid Bulb/Proximal R ICA stenosis up to 75% Follow outpatient with vascular  3 mm focal outpouching from supraclinoid L ICA Aneurysm not excluded, likely small vascular infundibulum Follow outpatient  Pyuria Follow culture  CAD PAD - Holding home Plavix and aspirin in the setting of GI bleed as above - Continue home carvedilol and losartan  Hypertension - Continue home carvedilol and losartan - continue loop diuretics   RLS - Continue home ropinirole  Atrial fibrillation - Continue home carvedilol   Diabetes > Diet controlled - SSI  Polymyalgia Rheumatica - she's on chronic steroids  DVT prophylaxis: SCD Code Status: DNR Family Communication: friend over phone 8/27 Disposition:   Status is: Observation  The patient will require care spanning > 2 midnights and should be moved to inpatient because: Inpatient level of care appropriate due to severity of illness  Dispo: The patient is from: Home              Anticipated d/c is to: Home              Patient currently is not medically stable to d/c.   Difficult to place patient No       Consultants:  GI  Procedures:  Echo IMPRESSIONS     1. Left ventricular ejection fraction, by estimation, is 30 to 35%. The  left ventricle has moderately decreased function. The left ventricle  demonstrates global hypokinesis. Indeterminate diastolic filling due to  E-Keaton Beichner fusion.   2. Right ventricular  systolic function is normal. The right ventricular  size is normal. There is normal pulmonary artery systolic pressure.   3. Left atrial size was severely dilated.   4. The mitral valve is grossly normal. Trivial mitral valve  regurgitation.   5. The aortic valve was not well visualized. Aortic valve regurgitation  is not visualized. No aortic stenosis is present.   6. The inferior vena cava is normal in size with greater than 50%  respiratory variability, suggesting right atrial pressure of 3 mmHg.   Comparison(s): Lotus Santillo prior study was performed on 06/16/2017. Prior images  reviewed side by side. Similar LV function.   Antimicrobials:  Anti-infectives (From admission, onward)    Start     Dose/Rate Route Frequency Ordered Stop   04/24/21 1530  cefTRIAXone (ROCEPHIN) 1 g in sodium chloride 0.9 % 100 mL IVPB        1 g 200 mL/hr over 30 Minutes Intravenous  Once 04/24/21 1526 04/24/21 1809   04/24/21 1530  azithromycin (ZITHROMAX) 500 mg in sodium chloride 0.9 % 250 mL IVPB        500 mg 250 mL/hr over 60 Minutes Intravenous  Once 04/24/21 1526 04/24/21  1913       Subjective: No new complaints  Objective: Vitals:   04/25/21 2111 04/26/21 0441 04/26/21 0656 04/26/21 0808  BP:  (!) 160/80 (!) 181/7   Pulse:  77 (!) 59   Resp:   16   Temp:  97.7 F (36.5 C) (!) 97.4 F (36.3 C)   TempSrc:  Oral Temporal   SpO2: 98% 99%  99%  Weight:  42.5 kg    Height:       No intake or output data in the 24 hours ending 04/26/21 1156  Filed Weights   04/24/21 2214 04/25/21 0552 04/26/21 0441  Weight: 42.3 kg 40.4 kg 42.5 kg    Examination:  General: No acute distress. Cardiovascular: RRR Lungs: unlabored Abdomen: Soft, nontender, nondistended  Neurological: Alert and oriented 3. Moves all extremities 4 with equal strength.  ?mild bilateral ptosis (no improvement of L eye with ice test).  Subjective worsening of L eyelid weakness with sustained upgaze.  EOM notable for weakness  with lateral gaze of L eye, intermittent.   Skin: Warm and dry. No rashes or lesions. Extremities: No clubbing or cyanosis. No edema.     Data Reviewed: I have personally reviewed following labs and imaging studies  CBC: Recent Labs  Lab 04/24/21 1527 04/24/21 1538 04/25/21 0345  WBC 8.3  --  8.8  NEUTROABS 6.6  --   --   HGB 6.5* 7.5* 7.7*  HCT 24.5* 22.0* 26.8*  MCV 76.8*  --  75.3*  PLT 544*  --  470*    Basic Metabolic Panel: Recent Labs  Lab 04/24/21 1527 04/24/21 1538 04/24/21 2224 04/25/21 0345  NA 144 142  --  141  K 3.8 3.8  --  3.8  CL 106 103  --  101  CO2 31  --   --  31  GLUCOSE 177* 171*  --  97  BUN 11 10  --  14  CREATININE 1.01* 0.90  --  1.08*  CALCIUM 8.8*  --   --  8.6*  MG  --   --  1.9  --     GFR: Estimated Creatinine Clearance: 29.7 mL/min (Idora Brosious) (by C-G formula based on SCr of 1.08 mg/dL (H)).  Liver Function Tests: Recent Labs  Lab 04/24/21 1527 04/25/21 0345  AST 15 12*  ALT 14 14  ALKPHOS 49 48  BILITOT 0.6 1.2  PROT 6.0* 5.8*  ALBUMIN 3.4* 3.2*    CBG: Recent Labs  Lab 04/25/21 1110 04/25/21 1639 04/25/21 2139 04/26/21 0809 04/26/21 1151  GLUCAP 127* 241* 116* 89 205*     Recent Results (from the past 240 hour(s))  Culture, blood (routine x 2)     Status: None (Preliminary result)   Collection Time: 04/24/21  3:27 PM   Specimen: BLOOD RIGHT ARM  Result Value Ref Range Status   Specimen Description BLOOD RIGHT ARM  Final   Special Requests   Final    BOTTLES DRAWN AEROBIC AND ANAEROBIC Blood Culture results may not be optimal due to an inadequate volume of blood received in culture bottles   Culture   Final    NO GROWTH < 24 HOURS Performed at [facility], [place] [address], [place], [place] [address]    Report Status PENDING  Incomplete  Resp Panel by RT-PCR (Flu Laneice Meneely&B, Covid) Nasopharyngeal Swab     Status: None   Collection Time: 04/24/21  3:39 PM   Specimen: Nasopharyngeal Swab; Nasopharyngeal(NP) swabs  in vial  transport medium  Result Value Ref Range Status   SARS Coronavirus 2 by RT PCR NEGATIVE NEGATIVE Final    Comment: (NOTE) SARS-CoV-2 target nucleic acids are NOT DETECTED.  The SARS-CoV-2 RNA is generally detectable in upper respiratory specimens during the acute phase of infection. The lowest concentration of SARS-CoV-2 viral copies this assay can detect is 138 copies/mL. Sharia Averitt negative result does not preclude SARS-Cov-2 infection and should not be used as the sole basis for treatment or other patient management decisions. Kyson Kupper negative result may occur with  improper specimen collection/handling, submission of specimen other than nasopharyngeal swab, presence of viral mutation(s) within the areas targeted by this assay, and inadequate number of viral copies(<138 copies/mL). Farrell Pantaleo negative result must be combined with clinical observations, patient history, and epidemiological information. The expected result is Negative.  Fact Sheet for Patients:  EntrepreneurPulse.com.au  Fact Sheet for Healthcare Providers:  IncredibleEmployment.be  This test is no t yet approved or cleared by the [place] FDA and  has been authorized for detection and/or diagnosis of SARS-CoV-2 by FDA under an Emergency Use Authorization (EUA). This EUA will remain  in effect (meaning this test can be used) for the duration of the COVID-19 declaration under Section 564(b)(1) of the Act, 21 U.S.C.section 360bbb-3(b)(1), unless the authorization is terminated  or revoked sooner.       Influenza Tasheem Elms by PCR NEGATIVE NEGATIVE Final   Influenza B by PCR NEGATIVE NEGATIVE Final    Comment: (NOTE) The Xpert Xpress SARS-CoV-2/FLU/RSV plus assay is intended as an aid in the diagnosis of influenza from Nasopharyngeal swab specimens and should not be used as Talha Iser sole basis for treatment. Nasal washings and aspirates are unacceptable for Xpert Xpress  SARS-CoV-2/FLU/RSV testing.  Fact Sheet for Patients: EntrepreneurPulse.com.au  Fact Sheet for Healthcare Providers: IncredibleEmployment.be  This test is not yet approved or cleared by the [place] FDA and has been authorized for detection and/or diagnosis of SARS-CoV-2 by FDA under an Emergency Use Authorization (EUA). This EUA will remain in effect (meaning this test can be used) for the duration of the COVID-19 declaration under Section 564(b)(1) of the Act, 21 U.S.C. section 360bbb-3(b)(1), unless the authorization is terminated or revoked.  Performed at [facility], [place] [address] [place], [place] [address]   Culture, blood (routine x 2)     Status: None (Preliminary result)   Collection Time: 04/24/21 10:24 PM   Specimen: BLOOD  Result Value Ref Range Status   Specimen Description BLOOD LEFT ANTECUBITAL  Final   Special Requests AEROBIC BOTTLE ONLY Blood Culture adequate volume  Final   Culture   Final    NO GROWTH < 24 HOURS Performed at [facility], [place] [address] [place], [place] [address]    Report Status PENDING  Incomplete  Urine Culture     Status: None   Collection Time: 04/25/21  5:02 AM   Specimen: Urine, Clean Catch  Result Value Ref Range Status   Specimen Description URINE, CLEAN CATCH  Final   Special Requests NONE  Final   Culture   Final    NO GROWTH Performed at [facility], [place] [address], [place], [place] [address]    Report Status 04/26/2021 FINAL  Final         Radiology Studies: CT ANGIO HEAD NECK W WO CM  Result Date: 04/26/2021 CLINICAL DATA:  Initial evaluation for neck weakness, difficulty with ambulation. EXAM: CT ANGIOGRAPHY HEAD AND NECK TECHNIQUE: Multidetector CT  imaging of the head and neck was performed using the standard protocol during bolus administration of intravenous contrast. Multiplanar CT image reconstructions and MIPs were obtained to evaluate the  vascular anatomy. Carotid stenosis measurements (when applicable) are obtained utilizing NASCET criteria, using the distal internal carotid diameter as the denominator. CONTRAST:  51m OMNIPAQUE IOHEXOL 350 MG/ML SOLN COMPARISON:  Prior CT from 10/30/2020.  As well as earlier studies. FINDINGS: CT HEAD FINDINGS Brain: Generalized age-related cerebral atrophy with moderately advanced chronic microvascular ischemic disease. No acute intracranial hemorrhage. No acute large vessel territory infarct. No mass lesion, mass effect or midline shift. No hydrocephalus or extra-axial fluid collection. Vascular: No hyperdense vessel. Calcified atherosclerosis present at skull base. Skull: Scalp soft tissues demonstrate no acute finding. Calvarium intact. 7 mm sclerotic lesion at the right frontal bone, stable as compared to multiple previous studies dating back to at least 2017, consistent with Vella Colquitt benign finding. Sinuses: Visualized paranasal sinuses are clear. Small chronic appearing left mastoid effusion. Orbits: Globes and orbital soft tissues demonstrate no acute finding. Review of the MIP images confirms the above findings CTA NECK FINDINGS Aortic arch: Visualized aortic arch normal in caliber with normal 3 vessel morphology. Advanced atheromatous change seen throughout the visualized aorta and about the origin of the great vessels, but with no significant stenosis. Right carotid system: Left CCA tortuous but patent without significant stenosis. Extensive atheromatous change about the right carotid bulb/proximal right ICA with associated stenosis of up to 75% by NASCET criteria. Right ICA tortuous but is otherwise patent to the skull base without stenosis, dissection or occlusion. Left carotid system: Left CCA tortuous but is widely patent to the bifurcation. Scattered calcified plaque about the left carotid bulb/proximal left ICA without significant stenosis. Left ICA tortuous but is widely patent without stenosis,  dissection or occlusion. Vertebral arteries: Both vertebral arteries arise from subclavian arteries. No hemodynamically significant proximal subclavian artery stenosis. Strongly dominant right vertebral artery, with Mandi Mattioli diffusely hypoplastic left vertebral artery. Vertebral arteries are diffusely tortuous with scattered atheromatous irregularity, but no high-grade stenosis or other acute vascular abnormality. Skeleton: Compression deformities involving the T2 and T3 vertebral bodies noted, severe at T3. Focal exaggeration of the normal upper thoracic kyphosis. No other discrete or worrisome osseous lesions. Patient is edentulous. Other neck: No other visible acute soft tissue abnormality within the neck. No mass or adenopathy. Upper chest: Left-sided pacemaker/AICD in place. Multiple remote bilateral rib fractures noted. Small layering bilateral pleural effusions, left greater than right, partially visualized. Emphysema. Review of the MIP images confirms the above findings CTA HEAD FINDINGS Anterior circulation: Scattered atheromatous change throughout the petrous, cavernous, and supraclinoid segments without flow-limiting stenosis. 3 mm focal outpouching arising from the supraclinoid left ICA favored to reflect Yuma Pacella small vascular infundibulum, although possible aneurysm could also be considered (series 8, image 187). A1 segments patent bilaterally. Normal anterior communicating artery complex. Anterior cerebral arteries patent to their distal aspects without stenosis. No M1 stenosis or occlusion. Normal MCA bifurcations. Distal MCA branches well perfused and symmetric. Posterior circulation: Dominant right vertebral artery patent to the vertebrobasilar junction. Hypoplastic left vertebral artery largely terminates in PICA. Left PICA patent. Right PICA not well seen. Basilar patent to its distal aspect without stenosis. Superior cerebral arteries patent bilaterally. Both PCAs primarily supplied via the basilar well  perfused to their distal aspects. Venous sinuses: Patent allowing for timing the contrast bolus. Anatomic variants: Hypoplastic left vertebral artery terminates in PICA. Review of the MIP images confirms the above findings  IMPRESSION: CT HEAD IMPRESSION: 1. No acute intracranial abnormality. 2. Age-related cerebral atrophy with moderately advanced chronic microvascular ischemic disease. CTA HEAD AND NECK IMPRESSION: 1. Negative CTA for large vessel occlusion. 2. Extensive atheromatous change about the right carotid bulb/proximal right ICA with associated stenosis of up to 75% by NASCET criteria. 3. Additional moderate to advanced atheromatous change elsewhere about the major arterial vasculature of the neck, but no other proximal high-grade or correctable stenosis. 4. 3 mm focal outpouching arising from the supraclinoid left ICA, favored to reflect Tabbetha Kutscher small vascular infundibulum, although aneurysm not excluded. Attention at follow-up recommended. 5. Compression deformities involving the T2 and T3 vertebral bodies, severe at T3. Correlation with physical exam recommended. Multiple additional subacute to chronic bilateral rib fractures. 6. Small layering bilateral pleural effusions, left greater than right, partially visualized. 7. Emphysema (ICD10-J43.9). Electronically Signed   By: Jeannine Boga M.D.   On: 04/26/2021 00:38   DG Cervical Spine 2 or 3 views  Result Date: 04/25/2021 CLINICAL DATA:  Pain EXAM: CERVICAL SPINE - 2-3 VIEW COMPARISON:  CT thoracic spine 10/30/2020 FINDINGS: osteopenia. The cervicothoracic junction including the C7 fracture demonstrated on prior study is poorly visualized. Upper thoracic compression deformities are again demonstrated. Changes of cement augmentation in the lower thoracic spine. Subacute left rib fractures again noted. Aortic Atherosclerosis (ICD10-170.0). Bilateral calcified carotid arterial plaque. Left subclavian transvenous pacing leads partially visualized.  IMPRESSION: 1. Osteopenia.  Incomplete evaluation of lower cervical spine. 2. Upper thoracic and left rib fractures, as previously described. Electronically Signed   By: Lucrezia Europe M.D.   On: 04/25/2021 14:11   CT C-SPINE NO CHARGE  Result Date: 04/25/2021 CLINICAL DATA:  Neck weakness.  Difficulty ambulating. EXAM: CT CERVICAL SPINE WITHOUT CONTRAST TECHNIQUE: Multidetector CT imaging of the cervical spine was performed without intravenous contrast. Multiplanar CT image reconstructions were also generated. COMPARISON:  X-ray cervical spine 04/25/2021, CT thoracic spine 10/30/2020 FINDINGS: Alignment: Normal. Skull base and vertebrae: No acute cervical spine fracture. No aggressive appearing focal osseous lesion or focal pathologic process. Chronic anterior wedge compression fracture with greater than 95% height loss of the T3 vertebral body. Soft tissues and spinal canal: No prevertebral fluid or swelling. No visible canal hematoma. Upper chest: Emphysematous changes. No pneumothorax at the apices. Atherosclerotic plaque of the aortic arch. Other: Redemonstration of subacute left rib fractures. Vasculature and intracranial: Please see separately dictated CT angiography head and neck 04/25/2021. IMPRESSION: 1. No acute displaced fracture or traumatic listhesis of the cervical spine. 2. Chronic anterior wedge compression fracture with greater than 95% height loss of the T3 vertebral body. 3. Please see separately dictated CT angiography head and neck 04/25/2021. 4. Redemonstration of subacute left rib fractures. 5. Aortic Atherosclerosis (ICD10-I70.0) and Emphysema (ICD10-J43.9). Electronically Signed   By: Iven Finn M.D.   On: 04/25/2021 20:04   DG Chest Port 1 View  Result Date: 04/24/2021 CLINICAL DATA:  Shortness of breath EXAM: PORTABLE CHEST 1 VIEW COMPARISON:  Chest radiograph 08/05/2020 FINDINGS: There is Jonathan Corpus left chest wall cardiac device with Payam Gribble single lead projecting over the right ventricle.  The heart is enlarged, accentuated by AP technique. The mediastinal contours are within normal limits. There is calcified atherosclerotic plaque of the aortic arch. The lungs are hyperlucent with flattening of the diaphragms suggesting COPD. Linear opacities in the right lung base likely reflect scarring and/or atelectasis. There is no focal consolidation or pulmonary edema. There is Vinnie Bobst small right pleural effusion. There is no significant left pleural effusion.  There is no pneumothorax. There is Leib Elahi displaced fracture of the posterior left fifth rib which may be acute. Prior vertebral augmentation in the thoracic spine is noted. IMPRESSION: 1. Trace right pleural effusion with adjacent atelectasis. 2. Cardiomegaly and suspected COPD. 3. Displaced fracture of the posterior left fifth rib which may be acute. Correlate with history and tenderness, and CT may be considered as indicated. Electronically Signed   By: Valetta Mole M.D.   On: 04/24/2021 16:26   ECHOCARDIOGRAM COMPLETE  Result Date: 04/25/2021    ECHOCARDIOGRAM REPORT   Patient Name:   ROLENA KNUTSON Date of Exam: 04/25/2021 Medical Rec #:  [id number]     Height:       60.5 in Accession #:    [id number]    Weight:       89.1 lb Date of Birth:  1946/05/13     BSA:          1.332 m Patient Age:    44 years      BP:           128/62 mmHg Patient Gender: F             HR:           70 bpm. Exam Location:  Inpatient Procedure: 2D Echo, Color Doppler and Cardiac Doppler Indications:    R06.9 DOE  History:        Patient has prior history of Echocardiogram examinations, most                 recent 06/16/2017. CHF, Defibrillator, COPD, Arrythmias:Atrial                 Fibrillation; Risk Factors:Hypertension, Diabetes and                 Dyslipidemia.  Sonographer:    Raquel Sarna Senior RDCS Referring Phys: [id number] Candace Gallus MELVIN  Sonographer Comments: Very poor echo windows due to body habitus and COPD. Scanned hunched over/upright, unable to reposition due to C-spine  concerns per RN. IMPRESSIONS  1. Left ventricular ejection fraction, by estimation, is 30 to 35%. The left ventricle has moderately decreased function. The left ventricle demonstrates global hypokinesis. Indeterminate diastolic filling due to E-Tykel Badie fusion.  2. Right ventricular systolic function is normal. The right ventricular size is normal. There is normal pulmonary artery systolic pressure.  3. Left atrial size was severely dilated.  4. The mitral valve is grossly normal. Trivial mitral valve regurgitation.  5. The aortic valve was not well visualized. Aortic valve regurgitation is not visualized. No aortic stenosis is present.  6. The inferior vena cava is normal in size with greater than 50% respiratory variability, suggesting right atrial pressure of 3 mmHg. Comparison(s): Alaisha Eversley prior study was performed on 06/16/2017. Prior images reviewed side by side. Similar LV function. FINDINGS  Left Ventricle: Left ventricular ejection fraction, by estimation, is 30 to 35%. The left ventricle has moderately decreased function. The left ventricle demonstrates global hypokinesis. The left ventricular internal cavity size was normal in size. There is no left ventricular hypertrophy. Indeterminate diastolic filling due to E-Meta Kroenke fusion. Right Ventricle: The right ventricular size is normal. No increase in right ventricular wall thickness. Right ventricular systolic function is normal. There is normal pulmonary artery systolic pressure. The tricuspid regurgitant velocity is 1.99 m/s, and  with an assumed right atrial pressure of 3 mmHg, the estimated right ventricular systolic pressure is 80.3 mmHg. Left Atrium: Left atrial size was severely dilated. Right Atrium:  Right atrial size was normal in size. Pericardium: Trivial pericardial effusion is present. Mitral Valve: The mitral valve is grossly normal. Mild mitral annular calcification. Trivial mitral valve regurgitation. Tricuspid Valve: The tricuspid valve is grossly normal.  Tricuspid valve regurgitation is trivial. No evidence of tricuspid stenosis. Aortic Valve: The aortic valve was not well visualized. Aortic valve regurgitation is not visualized. No aortic stenosis is present. Pulmonic Valve: The pulmonic valve was not well visualized. Pulmonic valve regurgitation is not visualized. No evidence of pulmonic stenosis. Aorta: The aortic root is normal in size and structure. Venous: The inferior vena cava is normal in size with greater than 50% respiratory variability, suggesting right atrial pressure of 3 mmHg. IAS/Shunts: The atrial septum is grossly normal. Additional Comments: Corinthian Mizrahi device lead is visualized.  LEFT VENTRICLE PLAX 2D LVIDd:         5.20 cm      Diastology LVIDs:         4.30 cm      LV e' medial:  3.37 cm/s LV PW:         0.70 cm      LV e' lateral: 5.77 cm/s LV IVS:        0.70 cm LVOT diam:     2.30 cm LV SV:         57 LV SV Index:   42 LVOT Area:     4.15 cm  LV Volumes (MOD) LV vol d, MOD A4C: 138.0 ml LV vol s, MOD A4C: 78.7 ml LV SV MOD A4C:     138.0 ml RIGHT VENTRICLE RV S prime:     10.20 cm/s TAPSE (M-mode): 1.8 cm LEFT ATRIUM           Index       RIGHT ATRIUM           Index LA diam:      3.60 cm 2.70 cm/m  RA Area:     14.60 cm LA Vol (A4C): 94.1 ml 70.66 ml/m RA Volume:   37.30 ml  28.01 ml/m  AORTIC VALVE LVOT Vmax:   65.30 cm/s LVOT Vmean:  46.800 cm/s LVOT VTI:    0.136 m  AORTA Ao Root diam: 3.20 cm TRICUSPID VALVE TR Peak grad:   15.8 mmHg TR Vmax:        199.00 cm/s  SHUNTS Systemic VTI:  0.14 m Systemic Diam: 2.30 cm Rudean Haskell MD Electronically signed by Rudean Haskell MD Signature Date/Time: 04/25/2021/3:09:20 PM    Final         Scheduled Meds:  carvedilol  6.25 mg Oral BID WC   insulin aspart  0-5 Units Subcutaneous QHS   insulin aspart  0-9 Units Subcutaneous TID WC   ipratropium-albuterol  3 mL Nebulization TID   lidocaine  1 patch Transdermal Q24H   losartan  50 mg Oral QPM   mometasone-formoterol  2 puff  Inhalation BID   pantoprazole  40 mg Oral Daily   predniSONE  40 mg Oral Q breakfast   sodium chloride flush  3 mL Intravenous Q12H   torsemide  20 mg Oral Daily   Continuous Infusions:  sodium chloride       LOS: 1 day    Time spent: over 30 min    Fayrene Helper, MD [facility]   To contact the attending provider between 7A-7P or the covering provider during after hours 7P-7A, please log into the web site [email or web address] and access using universal [place] password for  that web site. If you do not have the password, please call the hospital operator.  04/26/2021, 11:56 AM

## 2021-04-26 NOTE — Progress Notes (Signed)
NIF -30 Great effort

## 2021-04-27 ENCOUNTER — Inpatient Hospital Stay (HOSPITAL_COMMUNITY): Payer: Medicare Other

## 2021-04-27 DIAGNOSIS — J9621 Acute and chronic respiratory failure with hypoxia: Secondary | ICD-10-CM

## 2021-04-27 LAB — CBC WITH DIFFERENTIAL/PLATELET
Abs Immature Granulocytes: 0.04 10*3/uL (ref 0.00–0.07)
Basophils Absolute: 0 10*3/uL (ref 0.0–0.1)
Basophils Relative: 0 %
Eosinophils Absolute: 0 10*3/uL (ref 0.0–0.5)
Eosinophils Relative: 0 %
HCT: 26.9 % — ABNORMAL LOW (ref 36.0–46.0)
Hemoglobin: 7.6 g/dL — ABNORMAL LOW (ref 12.0–15.0)
Immature Granulocytes: 0 %
Lymphocytes Relative: 7 %
Lymphs Abs: 0.9 10*3/uL (ref 0.7–4.0)
MCH: 21.5 pg — ABNORMAL LOW (ref 26.0–34.0)
MCHC: 28.3 g/dL — ABNORMAL LOW (ref 30.0–36.0)
MCV: 76 fL — ABNORMAL LOW (ref 80.0–100.0)
Monocytes Absolute: 0.7 10*3/uL (ref 0.1–1.0)
Monocytes Relative: 6 %
Neutro Abs: 10.5 10*3/uL — ABNORMAL HIGH (ref 1.7–7.7)
Neutrophils Relative %: 87 %
Platelets: 494 10*3/uL — ABNORMAL HIGH (ref 150–400)
RBC: 3.54 MIL/uL — ABNORMAL LOW (ref 3.87–5.11)
RDW: 16.7 % — ABNORMAL HIGH (ref 11.5–15.5)
WBC: 12.1 10*3/uL — ABNORMAL HIGH (ref 4.0–10.5)
nRBC: 0 % (ref 0.0–0.2)

## 2021-04-27 LAB — GLUCOSE, CAPILLARY
Glucose-Capillary: 123 mg/dL — ABNORMAL HIGH (ref 70–99)
Glucose-Capillary: 131 mg/dL — ABNORMAL HIGH (ref 70–99)
Glucose-Capillary: 223 mg/dL — ABNORMAL HIGH (ref 70–99)
Glucose-Capillary: 211 mg/dL — ABNORMAL HIGH (ref 70–99)

## 2021-04-27 LAB — COMPREHENSIVE METABOLIC PANEL
ALT: 10 U/L (ref 0–44)
AST: 11 U/L — ABNORMAL LOW (ref 15–41)
Albumin: 2.9 g/dL — ABNORMAL LOW (ref 3.5–5.0)
Alkaline Phosphatase: 44 U/L (ref 38–126)
Anion gap: 7 (ref 5–15)
BUN: 24 mg/dL — ABNORMAL HIGH (ref 8–23)
CO2: 33 mmol/L — ABNORMAL HIGH (ref 22–32)
Calcium: 8.5 mg/dL — ABNORMAL LOW (ref 8.9–10.3)
Chloride: 100 mmol/L (ref 98–111)
Creatinine, Ser: 1.14 mg/dL — ABNORMAL HIGH (ref 0.44–1.00)
GFR, Estimated: 50 mL/min — ABNORMAL LOW (ref >60)
Glucose, Bld: 173 mg/dL — ABNORMAL HIGH (ref 70–99)
Potassium: 3.1 mmol/L — ABNORMAL LOW (ref 3.5–5.1)
Sodium: 140 mmol/L (ref 135–145)
Total Bilirubin: 0.5 mg/dL (ref 0.3–1.2)
Total Protein: 5.2 g/dL — ABNORMAL LOW (ref 6.5–8.1)

## 2021-04-27 LAB — HEMOGLOBIN A1C
Hgb A1c MFr Bld: 7 % — ABNORMAL HIGH (ref 4.8–5.6)
Mean Plasma Glucose: 154.2 mg/dL

## 2021-04-27 LAB — PHOSPHORUS: Phosphorus: 2.7 mg/dL (ref 2.5–4.6)

## 2021-04-27 LAB — MAGNESIUM: Magnesium: 1.9 mg/dL (ref 1.7–2.4)

## 2021-04-27 MED ORDER — IOHEXOL 9 MG/ML PO SOLN
ORAL | Status: AC
  Filled 2021-04-27: qty 1000

## 2021-04-27 MED ORDER — OXYCODONE HCL 5 MG PO TABS
5.0000 mg | ORAL_TABLET | Freq: Once | ORAL | Status: AC
Start: 2021-04-27 — End: 2021-04-27
  Administered 2021-04-27: 5 mg via ORAL
  Filled 2021-04-27: qty 1

## 2021-04-27 MED ORDER — PYRIDOSTIGMINE BROMIDE 60 MG PO TABS
30.0000 mg | ORAL_TABLET | Freq: Once | ORAL | Status: AC
  Administered 2021-04-27: 30 mg via ORAL
  Filled 2021-04-27: qty 0.5

## 2021-04-27 MED ORDER — POTASSIUM CHLORIDE CRYS ER 20 MEQ PO TBCR
40.0000 meq | EXTENDED_RELEASE_TABLET | ORAL | Status: AC
  Administered 2021-04-27 (×2): 40 meq via ORAL
  Filled 2021-04-27 (×2): qty 2

## 2021-04-27 MED ORDER — IOHEXOL 350 MG/ML SOLN
75.0000 mL | Freq: Once | INTRAVENOUS | Status: AC | PRN
  Administered 2021-04-27: 75 mL via INTRAVENOUS

## 2021-04-27 NOTE — Progress Notes (Signed)
Patient performed NIF with good effort  NIF -26

## 2021-04-27 NOTE — Progress Notes (Addendum)
PROGRESS NOTE    Kimberly Downs  INO:[id number] DOB: February 22, 1946 DOA: 04/24/2021 PCP: Lyman Bishop, DO   Chief Complaint  Patient presents with   Shortness of Breath   Brief Narrative:  Kimberly Downs is Kimberly Downs 75 y.o. female with medical history significant of GI bleed, anemia, atrial fibrillation, bronchiectasis, CHF, C. difficile, COPD, CAD status post stenting, depression, hypertension, ICD placement, PAD, diabetes, restless leg syndrome who presents with ongoing shortness of breath.  Patient states that she has had some dizziness and weakness for about the past week.  She has had some progressive symptoms of shortness of breath and weakness for the past couple days.  States she is been unable to keep her head up due to this weakness which appears to be acute on chronic with her kyphoscoliosis.  She is typically on 2 L of oxygen at baseline.  When her caretaker called 911 and EMS arrived she was saturating around 70% on 2 L and then she improved and placed on Presley Gora nonrebreather.  In the ED she has been weaned down to 4 to 5 L.   She denies fevers, chills, chest pain, abdominal pain, constipation, diarrhea, nausea, vomiting. She denies bloody or dark stools   Assessment & Plan:   Principal Problem:   Acute on chronic respiratory failure with hypoxia (HCC) Active Problems:   Anemia associated with acute blood loss - GI Bleed   DM (diabetes mellitus), type 2 with renal complications (HCC)   Essential hypertension   Coronary artery disease involving native coronary artery of native heart with angina pectoris (HCC)   COPD exacerbation (HCC)   Hypercholesterolemia   History of coronary artery stent placement   Chronic combined systolic and diastolic heart failure (HCC)   Paroxysmal atrial fibrillation (HCC)   ICD (implantable cardioverter-defibrillator) in place   Acute GI bleeding   Type 2 diabetes, diet controlled (HCC)   PAD (peripheral artery disease) (HCC)   Symptomatic  anemia  Difficulty Ambulating  Neck Weakness 1.5 weeks - friend notes she last walked 1.5 weeks ago and neck weakness started around then as well.  When head drops with neck in flexed position she has trouble breathing.  This sounds like the reason they presented.  No issues with bowel or bladder, moving all extremities with symmetric strength. Unclear etiology at this point -> concern for myasthenia - she notes ptosis, double vision, respiratory issues, generalized weakness, waxes and wanes - no clear worsening throughout the day - today noted to have intermittent issues with lateral gaze with L eye with her EOM and c/o double vision as well Neurology has been c/s - Acetylcholine receptor ab  - anti voltage gated calcium changel ab titer (LEMS) - NIF and FVC - d/c baclofen and beta blocker - may benefit from EMG/NCS of the neck musculature  - CT chest, abdomen, pelvis  - considering pyridostigmine trial  MRI Head/neck pending -> with pacemaker/ICD won't be done until Monday Will get CTA head/neck and CT c spine in the meantime as noted below Plain films of neck notable for osteopenia - upper thoracic and L rib fx  CK wnl, TSH low (follow free T4 elevated -> will repeat), ESR wnl, CRP wnl, B12 wnl Follow NIFs CTA head/neck without LVO, no acute intracranial abnormality - CT c spine without acute displaced fx or traumatic listhesis  Acute on Chronic Respiratory Failure (on 2 L chronically) 2/2 anemia, HF exacerbation, COPD exacerbation, and mechanical positioning of head/neck and generalized weakness above Treating  as noted below Follow NIFS  Symptomatic Anemia  Iron Deficiency Anemia  GI Bleed  Hemoccult Positive Presented with Hb 6.5 (11.8 07/2020) S/p 1 unit pRBC with improvement GI c/s, initially planning for endoscopy requested hold off until weakness above better understood.  GI now signed off.  Reconsult as needed.   Labs c/w iron def anemia.  Follow B12, folate labs. Would  benefit from iron transfusion prior to discharge  CHF Exacerbation Severely elevated BNP, clinically doesn't appear significantly overloaded (? If related to anemia or other cause) XR with trace R effusion, no edema Echo with EF 30-35%, moderately decreased function, global hypokinesis (prior echo with EF 25-30% - see report) Continue torsemide Strict I/O, daily weights Continue losartan, coreg  COPD Exacerbation on 2 L at home Plan for short steroid course Continue scheduled and prn nebs NIFs as noted above  Osteoporosis  Hx Fragility Fractures Upper thoracic and L rib fx noted on imaging here - osteopenia noted on imaging of cervical spine Compression deformities of T2/T3, severe at T3, subacute to chronic bilateral rib fx Recent dexa suggesting osteoporosis She's on chronic steroids notably   Right Carotid Bulb/Proximal R ICA stenosis up to 75% Follow outpatient with vascular  3 mm focal outpouching from supraclinoid L ICA Aneurysm not excluded, likely small vascular infundibulum Follow outpatient  Pyuria Follow culture  CAD PAD - Holding home Plavix and aspirin in the setting of GI bleed as above - Continue home carvedilol and losartan  Hypertension - Continue home carvedilol and losartan - continue loop diuretics   RLS - Continue home ropinirole  Atrial fibrillation - Continue home carvedilol   Diabetes > Diet controlled - SSI  Polymyalgia Rheumatica - she's on chronic steroids  DVT prophylaxis: SCD Code Status: DNR Family Communication: friend over phone 8/27 Disposition:   Status is: Observation  The patient will require care spanning > 2 midnights and should be moved to inpatient because: Inpatient level of care appropriate due to severity of illness  Dispo: The patient is from: Home              Anticipated d/c is to: Home              Patient currently is not medically stable to d/c.   Difficult to place patient No       Consultants:   GI  Procedures:  Echo IMPRESSIONS     1. Left ventricular ejection fraction, by estimation, is 30 to 35%. The  left ventricle has moderately decreased function. The left ventricle  demonstrates global hypokinesis. Indeterminate diastolic filling due to  E-Monique Hefty fusion.   2. Right ventricular systolic function is normal. The right ventricular  size is normal. There is normal pulmonary artery systolic pressure.   3. Left atrial size was severely dilated.   4. The mitral valve is grossly normal. Trivial mitral valve  regurgitation.   5. The aortic valve was not well visualized. Aortic valve regurgitation  is not visualized. No aortic stenosis is present.   6. The inferior vena cava is normal in size with greater than 50%  respiratory variability, suggesting right atrial pressure of 3 mmHg.   Comparison(s): Rejeana Fadness prior study was performed on 06/16/2017. Prior images  reviewed side by side. Similar LV function.   Antimicrobials:  Anti-infectives (From admission, onward)    Start     Dose/Rate Route Frequency Ordered Stop   04/24/21 1530  cefTRIAXone (ROCEPHIN) 1 g in sodium chloride 0.9 % 100 mL IVPB  1 g 200 mL/hr over 30 Minutes Intravenous  Once 04/24/21 1526 04/24/21 1809   04/24/21 1530  azithromycin (ZITHROMAX) 500 mg in sodium chloride 0.9 % 250 mL IVPB        500 mg 250 mL/hr over 60 Minutes Intravenous  Once 04/24/21 1526 04/24/21 1913       Subjective: No new complaints  Objective: Vitals:   04/27/21 0500 04/27/21 0623 04/27/21 0745 04/27/21 0831  BP:  109/81  129/81  Pulse:  79    Resp:  20    Temp:  98.4 F (36.9 C)    TempSrc:  Oral    SpO2:  90% 97%   Weight: 43.2 kg     Height:        Intake/Output Summary (Last 24 hours) at 04/27/2021 1431 Last data filed at 04/27/2021 1315 Gross per 24 hour  Intake 1703 ml  Output 1800 ml  Net -97 ml    Filed Weights   04/25/21 0552 04/26/21 0441 04/27/21 0500  Weight: 40.4 kg 42.5 kg 43.2 kg     Examination:  General: No acute distress. Cardiovascular: Heart sounds show Shatima Zalar regular rate, and rhythm. Lungs: Clear to auscultation bilaterally. Abdomen: Soft, nontender, nondistended Neurological: L ptosis  Skin: Warm and dry. No rashes or lesions. Extremities: No clubbing or cyanosis. No edema.     Data Reviewed: I have personally reviewed following labs and imaging studies  CBC: Recent Labs  Lab 04/24/21 1527 04/24/21 1538 04/25/21 0345 04/26/21 1754 04/27/21 0120  WBC 8.3  --  8.8 9.3 12.1*  NEUTROABS 6.6  --   --   --  10.5*  HGB 6.5* 7.5* 7.7* 8.0* 7.6*  HCT 24.5* 22.0* 26.8* 27.9* 26.9*  MCV 76.8*  --  75.3* 75.8* 76.0*  PLT 544*  --  470* 509* 494*    Basic Metabolic Panel: Recent Labs  Lab 04/24/21 1527 04/24/21 1538 04/24/21 2224 04/25/21 0345 04/26/21 1754 04/27/21 0120  NA 144 142  --  141 137 140  K 3.8 3.8  --  3.8 3.6 3.1*  CL 106 103  --  101 97* 100  CO2 31  --   --  31 30 33*  GLUCOSE 177* 171*  --  97 390* 173*  BUN 11 10  --  14 22 24*  CREATININE 1.01* 0.90  --  1.08* 1.16* 1.14*  CALCIUM 8.8*  --   --  8.6* 8.3* 8.5*  MG  --   --  1.9  --   --  1.9  PHOS  --   --   --   --   --  2.7    GFR: Estimated Creatinine Clearance: 28.6 mL/min (Amara Justen) (by C-G formula based on SCr of 1.14 mg/dL (H)).  Liver Function Tests: Recent Labs  Lab 04/24/21 1527 04/25/21 0345 04/26/21 1754 04/27/21 0120  AST 15 12* 14* 11*  ALT 14 14 12 10   ALKPHOS 49 48 45 44  BILITOT 0.6 1.2 0.5 0.5  PROT 6.0* 5.8* 5.5* 5.2*  ALBUMIN 3.4* 3.2* 3.0* 2.9*    CBG: Recent Labs  Lab 04/26/21 1151 04/26/21 1712 04/26/21 2125 04/27/21 0729 04/27/21 1126  GLUCAP 205* 347* 154* 123* 131*     Recent Results (from the past 240 hour(s))  Culture, blood (routine x 2)     Status: None (Preliminary result)   Collection Time: 04/24/21  3:27 PM   Specimen: BLOOD RIGHT ARM  Result Value Ref Range Status   Specimen Description BLOOD  RIGHT ARM  Final    Special Requests   Final    BOTTLES DRAWN AEROBIC AND ANAEROBIC Blood Culture results may not be optimal due to an inadequate volume of blood received in culture bottles   Culture   Final    NO GROWTH 3 DAYS Performed at [facility], [place] [address] [place], [place] [address]    Report Status PENDING  Incomplete  Resp Panel by RT-PCR (Flu Camdyn Beske&B, Covid) Nasopharyngeal Swab     Status: None   Collection Time: 04/24/21  3:39 PM   Specimen: Nasopharyngeal Swab; Nasopharyngeal(NP) swabs in vial transport medium  Result Value Ref Range Status   SARS Coronavirus 2 by RT PCR NEGATIVE NEGATIVE Final    Comment: (NOTE) SARS-CoV-2 target nucleic acids are NOT DETECTED.  The SARS-CoV-2 RNA is generally detectable in upper respiratory specimens during the acute phase of infection. The lowest concentration of SARS-CoV-2 viral copies this assay can detect is 138 copies/mL. Paullette Mckain negative result does not preclude SARS-Cov-2 infection and should not be used as the sole basis for treatment or other patient management decisions. Lakeena Downie negative result may occur with  improper specimen collection/handling, submission of specimen other than nasopharyngeal swab, presence of viral mutation(s) within the areas targeted by this assay, and inadequate number of viral copies(<138 copies/mL). Teauna Dubach negative result must be combined with clinical observations, patient history, and epidemiological information. The expected result is Negative.  Fact Sheet for Patients:  EntrepreneurPulse.com.au  Fact Sheet for Healthcare Providers:  IncredibleEmployment.be  This test is no t yet approved or cleared by the [place] FDA and  has been authorized for detection and/or diagnosis of SARS-CoV-2 by FDA under an Emergency Use Authorization (EUA). This EUA will remain  in effect (meaning this test can be used) for the duration of the COVID-19 declaration under Section 564(b)(1) of the Act,  21 U.S.C.section 360bbb-3(b)(1), unless the authorization is terminated  or revoked sooner.       Influenza [place]o Trine by PCR NEGATIVE NEGATIVE Final   Influenza B by PCR NEGATIVE NEGATIVE Final    Comment: (NOTE) The Xpert Xpress SARS-CoV-2/FLU/RSV plus assay is intended as an aid in the diagnosis of influenza from Nasopharyngeal swab specimens and should not be used as Terrell Ostrand sole basis for treatment. Nasal washings and aspirates are unacceptable for Xpert Xpress SARS-CoV-2/FLU/RSV testing.  Fact Sheet for Patients: EntrepreneurPulse.com.au  Fact Sheet for Healthcare Providers: IncredibleEmployment.be  This test is not yet approved or cleared by the [place] FDA and has been authorized for detection and/or diagnosis of SARS-CoV-2 by FDA under an Emergency Use Authorization (EUA). This EUA will remain in effect (meaning this test can be used) for the duration of the COVID-19 declaration under Section 564(b)(1) of the Act, 21 U.S.C. section 360bbb-3(b)(1), unless the authorization is terminated or revoked.  Performed at [facility], [place] [address], [place], [place] [address]   Culture, blood (routine x 2)     Status: None (Preliminary result)   Collection Time: 04/24/21 10:24 PM   Specimen: BLOOD  Result Value Ref Range Status   Specimen Description BLOOD LEFT ANTECUBITAL  Final   Special Requests AEROBIC BOTTLE ONLY Blood Culture adequate volume  Final   Culture   Final    NO GROWTH 3 DAYS Performed at [place] [address] [place], [place] [address]    Report Status PENDING  Incomplete  Urine Culture     Status: None   Collection Time: 04/25/21  5:02 AM   Specimen: Urine, Clean Catch  Result Value Ref Range Status   Specimen Description URINE, CLEAN CATCH  Final   Special Requests NONE  Final   Culture   Final    NO GROWTH Performed at [facility], [address], [place], [place] [address]    Report Status  04/26/2021 FINAL  Final         Radiology Studies: CT ANGIO HEAD NECK W WO CM  Result Date: 04/26/2021 CLINICAL DATA:  Initial evaluation for neck weakness, difficulty with ambulation. EXAM: CT ANGIOGRAPHY HEAD AND NECK TECHNIQUE: Multidetector CT imaging of the head and neck was performed using the standard protocol during bolus administration of intravenous contrast. Multiplanar CT image reconstructions and MIPs were obtained to evaluate the vascular anatomy. Carotid stenosis measurements (when applicable) are obtained utilizing NASCET criteria, using the distal internal carotid diameter as the denominator. CONTRAST:  63m OMNIPAQUE IOHEXOL 350 MG/ML SOLN COMPARISON:  Prior CT from 10/30/2020.  As well as earlier studies. FINDINGS: CT HEAD FINDINGS Brain: Generalized age-related cerebral atrophy with moderately advanced chronic microvascular ischemic disease. No acute intracranial hemorrhage. No acute large vessel territory infarct. No mass lesion, mass effect or midline shift. No hydrocephalus or extra-axial fluid collection. Vascular: No hyperdense vessel. Calcified atherosclerosis present at skull base. Skull: Scalp soft tissues demonstrate no acute finding. Calvarium intact. 7 mm sclerotic lesion at the right frontal bone, stable as compared to multiple previous studies dating back to at least 2017, consistent with Nadiah Corbit benign finding. Sinuses: Visualized paranasal sinuses are clear. Small chronic appearing left mastoid effusion. Orbits: Globes and orbital soft tissues demonstrate no acute finding. Review of the MIP images confirms the above findings CTA NECK FINDINGS Aortic arch: Visualized aortic arch normal in caliber with normal 3 vessel morphology. Advanced atheromatous change seen throughout the visualized aorta and about the origin of the great vessels, but with no significant stenosis. Right carotid system: Left CCA tortuous but patent without significant stenosis. Extensive atheromatous change  about the right carotid bulb/proximal right ICA with associated stenosis of up to 75% by NASCET criteria. Right ICA tortuous but is otherwise patent to the skull base without stenosis, dissection or occlusion. Left carotid system: Left CCA tortuous but is widely patent to the bifurcation. Scattered calcified plaque about the left carotid bulb/proximal left ICA without significant stenosis. Left ICA tortuous but is widely patent without stenosis, dissection or occlusion. Vertebral arteries: Both vertebral arteries arise from subclavian arteries. No hemodynamically significant proximal subclavian artery stenosis. Strongly dominant right vertebral artery, with Vitali Seibert diffusely hypoplastic left vertebral artery. Vertebral arteries are diffusely tortuous with scattered atheromatous irregularity, but no high-grade stenosis or other acute vascular abnormality. Skeleton: Compression deformities involving the T2 and T3 vertebral bodies noted, severe at T3. Focal exaggeration of the normal upper thoracic kyphosis. No other discrete or worrisome osseous lesions. Patient is edentulous. Other neck: No other visible acute soft tissue abnormality within the neck. No mass or adenopathy. Upper chest: Left-sided pacemaker/AICD in place. Multiple remote bilateral rib fractures noted. Small layering bilateral pleural effusions, left greater than right, partially visualized. Emphysema. Review of the MIP images confirms the above findings CTA HEAD FINDINGS Anterior circulation: Scattered atheromatous change throughout the petrous, cavernous, and supraclinoid segments without flow-limiting stenosis. 3 mm focal outpouching arising from the supraclinoid left ICA favored to reflect Christopherjame Carnell small vascular infundibulum, although possible aneurysm could also be considered (series 8, image 187). A1 segments patent bilaterally. Normal anterior communicating artery complex. Anterior cerebral  arteries patent to their distal aspects without stenosis. No M1  stenosis or occlusion. Normal MCA bifurcations. Distal MCA branches well perfused and symmetric. Posterior circulation: Dominant right vertebral artery patent to the vertebrobasilar junction. Hypoplastic left vertebral artery largely terminates in PICA. Left PICA patent. Right PICA not well seen. Basilar patent to its distal aspect without stenosis. Superior cerebral arteries patent bilaterally. Both PCAs primarily supplied via the basilar well perfused to their distal aspects. Venous sinuses: Patent allowing for timing the contrast bolus. Anatomic variants: Hypoplastic left vertebral artery terminates in PICA. Review of the MIP images confirms the above findings IMPRESSION: CT HEAD IMPRESSION: 1. No acute intracranial abnormality. 2. Age-related cerebral atrophy with moderately advanced chronic microvascular ischemic disease. CTA HEAD AND NECK IMPRESSION: 1. Negative CTA for large vessel occlusion. 2. Extensive atheromatous change about the right carotid bulb/proximal right ICA with associated stenosis of up to 75% by NASCET criteria. 3. Additional moderate to advanced atheromatous change elsewhere about the major arterial vasculature of the neck, but no other proximal high-grade or correctable stenosis. 4. 3 mm focal outpouching arising from the supraclinoid left ICA, favored to reflect Caramia Boutin small vascular infundibulum, although aneurysm not excluded. Attention at follow-up recommended. 5. Compression deformities involving the T2 and T3 vertebral bodies, severe at T3. Correlation with physical exam recommended. Multiple additional subacute to chronic bilateral rib fractures. 6. Small layering bilateral pleural effusions, left greater than right, partially visualized. 7. Emphysema (ICD10-J43.9). Electronically Signed   By: Jeannine Boga M.D.   On: 04/26/2021 00:38   CT C-SPINE NO CHARGE  Result Date: 04/25/2021 CLINICAL DATA:  Neck weakness.  Difficulty ambulating. EXAM: CT CERVICAL SPINE WITHOUT CONTRAST  TECHNIQUE: Multidetector CT imaging of the cervical spine was performed without intravenous contrast. Multiplanar CT image reconstructions were also generated. COMPARISON:  X-ray cervical spine 04/25/2021, CT thoracic spine 10/30/2020 FINDINGS: Alignment: Normal. Skull base and vertebrae: No acute cervical spine fracture. No aggressive appearing focal osseous lesion or focal pathologic process. Chronic anterior wedge compression fracture with greater than 95% height loss of the T3 vertebral body. Soft tissues and spinal canal: No prevertebral fluid or swelling. No visible canal hematoma. Upper chest: Emphysematous changes. No pneumothorax at the apices. Atherosclerotic plaque of the aortic arch. Other: Redemonstration of subacute left rib fractures. Vasculature and intracranial: Please see separately dictated CT angiography head and neck 04/25/2021. IMPRESSION: 1. No acute displaced fracture or traumatic listhesis of the cervical spine. 2. Chronic anterior wedge compression fracture with greater than 95% height loss of the T3 vertebral body. 3. Please see separately dictated CT angiography head and neck 04/25/2021. 4. Redemonstration of subacute left rib fractures. 5. Aortic Atherosclerosis (ICD10-I70.0) and Emphysema (ICD10-J43.9). Electronically Signed   By: Iven Finn M.D.   On: 04/25/2021 20:04        Scheduled Meds:  carvedilol  6.25 mg Oral BID WC   insulin aspart  0-5 Units Subcutaneous QHS   insulin aspart  0-9 Units Subcutaneous TID WC   iohexol       ipratropium-albuterol  3 mL Nebulization TID   lidocaine  1 patch Transdermal Q24H   losartan  50 mg Oral QPM   mometasone-formoterol  2 puff Inhalation BID   pantoprazole  40 mg Oral Daily   [START ON 04/30/2021] predniSONE  10 mg Oral Q breakfast   predniSONE  40 mg Oral Q breakfast   sodium chloride flush  3 mL Intravenous Q12H   torsemide  20 mg Oral Daily   Continuous Infusions:  sodium chloride       LOS: 2 days    Time  spent: over 30 min    Fayrene Helper, MD [facility]   To contact the attending provider between 7A-7P or the covering provider during after hours 7P-7A, please log into the web site [email or web address] and access using universal [place] password for that web site. If you do not have the password, please call the hospital operator.  04/27/2021, 2:31 PM

## 2021-04-27 NOTE — Progress Notes (Signed)
Neurology Progress Note  Patient ID: 75 year old female with a 2 week history of diffuse weakness with intermittent double vision and acute attacks of neck weakness favored to be 2/2 neuromuscular junction disorder, most likely myasthenia gravis.  Subjective: no change in weakness. Cannot fully lift up her head. NIFs this AM -28.  Objective:  Vitals:   04/27/21 0831 04/27/21 1504  BP: 129/81 (!) 163/64  Pulse:  85  Resp:  16  Temp:  98.4 F (36.9 C)  SpO2:  90%    Physical Exam  HEENT-  /AT   Lungs- Respirations unlabored. No tachypnea. No visible use of accessory muscles of respiration.  Abdomen- Nondistended Extremities- Warm and well perfused. Thinning skin with multiple old subcutaneous hemorrhagic lesions.      Neurological Examination Mental Status: Awake, alert and oriented. Speech fluent without evidence of aphasia.  Able to follow all commands without difficulty. Pleasant and cooperative with good recall for recent events.  Cranial Nerves: II: Temporal visual fields intact without extinction to DSS. PERRL.   III,IV, VI: Mild ptosis at baseline. When asked to look upwards for > 30 seconds, the patient's R eye drifts downward by about 2 mm, she experiences double vision. Prior to the above, eyes were conjugate and she was able to gaze to the far right and left as well as up and down without difficulty. No nystagmus.  V,VII: Smile symmetric without weakness. Facial temp sensation subjectively decreased bilaterally.  VIII: Hearing intact to conversation.  IX,X: No pharyngeal dysarthria or hypophonia XI: Neck flexion 4/5, neck rotation 4+/5 to right and left. Neck extension 4-5. Shoulder shrug is symmetric.  XII: Midline tongue extension. No atrophy or fasciculations noted.  Motor: BUE 4+/5 proximally and distally in the context of diffusely decreased muscle bulk.  No pronator drift . BLE 4/5 proximally and distally in the context of diffusely decreased muscle bulk.  Fatiguable weakness on repeated muscle testing L hip flexor. Sensory: Subjectively decreased temp sensation to BUE and BLE. FT intact x 4. No extinction to DSS.  Deep Tendon Reflexes: 1-2+ and symmetric throughout Cerebellar: No ataxia with FNF bilaterally Gait: Deferred  Data: no e/o malignancy on CT c/a/p w contrast 8/29  A/P: Assessment: 74 year old female with a 2 week history of diffuse weakness with intermittent double vision and acute attacks of neck weakness.  1. Exam reveals moderate weakness of neck extensors, mild weakness of neck flexion and head rotation to left and right. Also with fatiguability with sustained upgaze resulting in double vision, weakness of left eye supraduction and left sided ptosis. Limb strength is slightly decreased in the context of her diffusely decreased muscle bulk.  2. Most likely etiology for her weakness, given her history, symptoms and exam findings, is an acquired disorder of the neuromuscular junction, such as myasthenia gravis or Lambert-Eaton myasthenic syndrome (aka LEMS, which is a concern given her 20 lb weight loss over the past 4 months).  3. Multiple rib fractures on imaging. May have contributed to the acute worsening of her progressive SOB at home.   4. CT c/a/p with contrast showed no e/o malignancy or thymoma   Recommendations: - Myasthenia gravis panel has been ordered. Added more comprehensive send out panel [facility] test code [id number] Acetylcholine Receptor (AChR) Antibodies, Complete Profile with Reflex to MuSK Antibodies - Anti-voltage-gated calcium channel antibody titer (LEMS) has been ordered. - RT to assess NIF and FVC q 12 hrs - Baclofen and beta-blocker discontinued - Will give low-dose trial of  pyridostigmine 30mg  x1 to see if symptomatic improvement. Discussed with pharmacy potential drug interactions and comorbidities (esp COPD) and both felt small trial of drug which may lead to earlier dx and treatment had relatively low risk of  causing decompensation.  - Outpatient Vascular Surgery follow up regarding CTA finding of extensive atheromatous change about the right carotid bulb/proximal right ICA with associated stenosis of up to 75% by NASCET criteria. This finding is not felt to be related to her current presentation.  - Further recommendations pending the results of the above tests.  - Will need outpatient Neurology follow up. At that time may benefit from EMG/NCS of the neck musculature.   Su Monks, MD [facility] [phone]  If 7pm- 7am, please page neurology on call as listed in [place].

## 2021-04-27 NOTE — Progress Notes (Signed)
NIF -28 cmh20 Great effort

## 2021-04-27 NOTE — Progress Notes (Signed)
NIF performed -25

## 2021-04-27 NOTE — Progress Notes (Signed)
[facility] Progress Note    Since last GI note: EGD yesterday was cancelled due to evolving neurologic issues.  She's had no overt GI bleeding, no abd pains, she is eating well.  Her biggest complaint is weakness and inability to hold up her head  Objective: Vital signs in last 24 hours: Temp:  [98 F (36.7 C)-98.7 F (37.1 C)] 98.4 F (36.9 C) (08/29 0623) Pulse Rate:  [79-93] 79 (08/29 0623) Resp:  [18-20] 20 (08/29 0623) BP: (109-163)/(69-81) 129/81 (08/29 0831) SpO2:  [90 %-100 %] 97 % (08/29 0745) Weight:  [43.2 kg] 43.2 kg (08/29 0500) Last BM Date: 04/26/21 General: alert and oriented times 3 Heart: regular rate and rythm Abdomen: soft, non-tender, non-distended, normal bowel sounds   Lab Results: Recent Labs    04/25/21 0345 04/26/21 1754 04/27/21 0120  WBC 8.8 9.3 12.1*  HGB 7.7* 8.0* 7.6*  PLT 470* 509* 494*  MCV 75.3* 75.8* 76.0*   Recent Labs    04/25/21 0345 04/26/21 1754 04/27/21 0120  NA 141 137 140  K 3.8 3.6 3.1*  CL 101 97* 100  CO2 31 30 33*  GLUCOSE 97 390* 173*  BUN 14 22 24*  CREATININE 1.08* 1.16* 1.14*  CALCIUM 8.6* 8.3* 8.5*   Recent Labs    04/25/21 0345 04/26/21 1754 04/27/21 0120  PROT 5.8* 5.5* 5.2*  ALBUMIN 3.2* 3.0* 2.9*  AST 12* 14* 11*  ALT 14 12 10   ALKPHOS 48 45 44  BILITOT 1.2 0.5 0.5   Recent Labs    04/24/21 1527 04/25/21 0345  INR 0.9 0.9    Medications: Scheduled Meds:  carvedilol  6.25 mg Oral BID WC   insulin aspart  0-5 Units Subcutaneous QHS   insulin aspart  0-9 Units Subcutaneous TID WC   ipratropium-albuterol  3 mL Nebulization TID   lidocaine  1 patch Transdermal Q24H   losartan  50 mg Oral QPM   mometasone-formoterol  2 puff Inhalation BID   pantoprazole  40 mg Oral Daily   potassium chloride  40 mEq Oral Q4H   [START ON 04/30/2021] predniSONE  10 mg Oral Q breakfast   predniSONE  40 mg Oral Q breakfast   sodium chloride flush  3 mL Intravenous Q12H   torsemide  20 mg Oral  Daily   Continuous Infusions:  sodium chloride     PRN Meds:.acetaminophen **OR** acetaminophen, albuterol, baclofen, diclofenac Sodium, ondansetron, oxyCODONE **OR** oxyCODONE, rOPINIRole    Assessment/Plan: 75 y.o. female with chronic anemia, no overt bleeding  Her anemia is chronic (hb 8s 3 months ago). Was planning to have EGD with her local GI in [place].  She has h/o Dielafoy's lesion but has no overt bleeding recently that I can tell. Her neurologic issues predominate.  For now, we are signing off. She should plan to follow up with her local GI in [place], certainly call sooner if GI issues become a priority.  Please call or page with any further questions or concerns.   Milus Banister, MD  04/27/2021, 8:48 AM [facility] Pager [phone]

## 2021-04-28 ENCOUNTER — Ambulatory Visit: Payer: Medicare Other | Admitting: Physician Assistant

## 2021-04-28 LAB — CBC WITH DIFFERENTIAL/PLATELET
Abs Immature Granulocytes: 0.09 10*3/uL — ABNORMAL HIGH (ref 0.00–0.07)
Basophils Absolute: 0 10*3/uL (ref 0.0–0.1)
Basophils Relative: 0 %
Eosinophils Absolute: 0 10*3/uL (ref 0.0–0.5)
Eosinophils Relative: 0 %
HCT: 26.9 % — ABNORMAL LOW (ref 36.0–46.0)
Hemoglobin: 7.7 g/dL — ABNORMAL LOW (ref 12.0–15.0)
Immature Granulocytes: 1 %
Lymphocytes Relative: 7 %
Lymphs Abs: 1 10*3/uL (ref 0.7–4.0)
MCH: 21.5 pg — ABNORMAL LOW (ref 26.0–34.0)
MCHC: 28.6 g/dL — ABNORMAL LOW (ref 30.0–36.0)
MCV: 75.1 fL — ABNORMAL LOW (ref 80.0–100.0)
Monocytes Absolute: 0.8 10*3/uL (ref 0.1–1.0)
Monocytes Relative: 6 %
Neutro Abs: 11.7 10*3/uL — ABNORMAL HIGH (ref 1.7–7.7)
Neutrophils Relative %: 86 %
Platelets: 485 10*3/uL — ABNORMAL HIGH (ref 150–400)
RBC: 3.58 MIL/uL — ABNORMAL LOW (ref 3.87–5.11)
RDW: 16.8 % — ABNORMAL HIGH (ref 11.5–15.5)
WBC: 13.6 10*3/uL — ABNORMAL HIGH (ref 4.0–10.5)
nRBC: 0 % (ref 0.0–0.2)

## 2021-04-28 LAB — GLUCOSE, CAPILLARY
Glucose-Capillary: 134 mg/dL — ABNORMAL HIGH (ref 70–99)
Glucose-Capillary: 200 mg/dL — ABNORMAL HIGH (ref 70–99)
Glucose-Capillary: 227 mg/dL — ABNORMAL HIGH (ref 70–99)
Glucose-Capillary: 320 mg/dL — ABNORMAL HIGH (ref 70–99)
Glucose-Capillary: 251 mg/dL — ABNORMAL HIGH (ref 70–99)

## 2021-04-28 LAB — COMPREHENSIVE METABOLIC PANEL
ALT: 13 U/L (ref 0–44)
AST: 14 U/L — ABNORMAL LOW (ref 15–41)
Albumin: 3.1 g/dL — ABNORMAL LOW (ref 3.5–5.0)
Alkaline Phosphatase: 44 U/L (ref 38–126)
Anion gap: 9 (ref 5–15)
BUN: 27 mg/dL — ABNORMAL HIGH (ref 8–23)
CO2: 32 mmol/L (ref 22–32)
Calcium: 8.5 mg/dL — ABNORMAL LOW (ref 8.9–10.3)
Chloride: 97 mmol/L — ABNORMAL LOW (ref 98–111)
Creatinine, Ser: 1.27 mg/dL — ABNORMAL HIGH (ref 0.44–1.00)
GFR, Estimated: 44 mL/min — ABNORMAL LOW (ref >60)
Glucose, Bld: 183 mg/dL — ABNORMAL HIGH (ref 70–99)
Potassium: 3.5 mmol/L (ref 3.5–5.1)
Sodium: 138 mmol/L (ref 135–145)
Total Bilirubin: 0.6 mg/dL (ref 0.3–1.2)
Total Protein: 5.4 g/dL — ABNORMAL LOW (ref 6.5–8.1)

## 2021-04-28 LAB — MAGNESIUM: Magnesium: 2 mg/dL (ref 1.7–2.4)

## 2021-04-28 LAB — PHOSPHORUS: Phosphorus: 2.7 mg/dL (ref 2.5–4.6)

## 2021-04-28 MED ORDER — PYRIDOSTIGMINE BROMIDE 60 MG PO TABS
30.0000 mg | ORAL_TABLET | Freq: Three times a day (TID) | ORAL | Status: DC
  Administered 2021-04-29 (×2): 30 mg via ORAL
  Filled 2021-04-28 (×3): qty 0.5

## 2021-04-28 MED ORDER — VITAMIN B-12 1000 MCG PO TABS
1000.0000 ug | ORAL_TABLET | Freq: Every day | ORAL | Status: DC
  Administered 2021-04-29 – 2021-05-05 (×7): 1000 ug via ORAL
  Filled 2021-04-28 (×7): qty 1

## 2021-04-28 NOTE — Progress Notes (Signed)
Pt able to perform NIF with great effort  NIF -24

## 2021-04-28 NOTE — Progress Notes (Addendum)
PROGRESS NOTE    Kimberly Downs  YME:[id number] DOB: 11-17-1945 DOA: 04/24/2021 PCP: Lyman Bishop, DO   Chief Complaint  Patient presents with   Shortness of Breath   Brief Narrative:  Kimberly Downs is Kimberly Downs 75 y.o. female with medical history significant of GI bleed, anemia, atrial fibrillation, bronchiectasis, CHF, C. difficile, COPD, CAD status post stenting, depression, hypertension, ICD placement, PAD, diabetes, restless leg syndrome who presents with ongoing shortness of breath.  She complained of neck weakness and difficulty ambulating over the past 1.5 weeks as well which is when her symptoms started.    She's suspected to have myasthenia.  Neurology following and providing recommendations at this time.     Assessment & Plan:   Principal Problem:   Acute on chronic respiratory failure with hypoxia (HCC) Active Problems:   Anemia associated with acute blood loss - GI Bleed   DM (diabetes mellitus), type 2 with renal complications (HCC)   Essential hypertension   Coronary artery disease involving native coronary artery of native heart with angina pectoris (HCC)   COPD exacerbation (HCC)   Hypercholesterolemia   History of coronary artery stent placement   Chronic combined systolic and diastolic heart failure (HCC)   Paroxysmal atrial fibrillation (HCC)   ICD (implantable cardioverter-defibrillator) in place   Acute GI bleeding   Type 2 diabetes, diet controlled (HCC)   PAD (peripheral artery disease) (HCC)   Symptomatic anemia  Difficulty Ambulating  Neck Weakness 1.5 weeks - friend notes she last walked 1.5 weeks ago and neck weakness started around then as well.  When head drops with neck in flexed position she has trouble breathing.  This sounds like the reason they presented.  No issues with bowel or bladder, moving all extremities with symmetric strength. Unclear etiology at this point -> concern for myasthenia - she notes ptosis, double vision, respiratory  issues, generalized weakness, waxes and wanes - no clear worsening throughout the day - today noted to have intermittent issues with lateral gaze with L eye with her EOM and c/o double vision as well Neurology has been c/s - Acetylcholine receptor ab pending - anti voltage gated calcium changel ab titer (LEMS) - NIF and FVC - d/c baclofen and beta blocker - may benefit from EMG/NCS of the neck musculature  - CT chest, abdomen, pelvis -> bilateral posterior basilar subsegmental atelecatsis and small L effusion, multiple old bilateral and  sternal fx, coronary artery calcifications - considering pyridostigmine trial  (pt notes subjective improvement after this yesterday) - treatment per neurology -> started on mestinon TID MRI Head/neck pending  Will get CTA head/neck and CT c spine in the meantime as noted below Plain films of neck notable for osteopenia - upper thoracic and L rib fx  CK wnl, TSH low (follow free T4 elevated -> will repeat), ESR wnl, CRP wnl, B12 wnl Follow NIFs CTA head/neck without LVO, no acute intracranial abnormality - CT c spine without acute displaced fx or traumatic listhesis  Acute on Chronic Respiratory Failure (on 2 L chronically) 2/2 anemia, HF exacerbation, COPD exacerbation, and mechanical positioning of head/neck and generalized weakness above Follow NIFS  Symptomatic Anemia  Iron Deficiency Anemia  GI Bleed  Hemoccult Positive Presented with Hb 6.5 (11.8 07/2020) S/p 1 unit pRBC with improvement GI c/s, initially planning for endoscopy requested hold off until weakness above better understood.  GI now signed off.  Reconsult as needed.   Labs c/w iron def anemia.  Follow B12 (low normal ->  follow MMA and supplement), folate labs (wnl). Would benefit from iron transfusion prior to discharge  CHF Exacerbation Severely elevated BNP, clinically doesn't appear significantly overloaded (? If related to anemia or other cause) XR with trace R effusion, no  edema Echo with EF 30-35%, moderately decreased function, global hypokinesis (prior echo with EF 25-30% - see report) Continue torsemide Strict I/O, daily weights Continue losartan, coreg  COPD Exacerbation on 2 L at home Plan for short steroid course Continue scheduled and prn nebs NIFs as noted above  Mild Bilateral Hydroureteronephrosis 7 cc on bladder scan - discussed with Dr. Tresa Moore, noted mild finding on CT, likely related to bladder distension at time of study, no recommendation for any additional imaging or workup unless new symptoms or aki   Osteoporosis  Hx Fragility Fractures Upper thoracic and L rib fx noted on imaging here - osteopenia noted on imaging of cervical spine Compression deformities of T2/T3, severe at T3, subacute to chronic bilateral rib fx Recent dexa suggesting osteoporosis She's on chronic steroids notably   Right Carotid Bulb/Proximal R ICA stenosis up to 75% Follow outpatient with vascular  3 mm focal outpouching from supraclinoid L ICA Aneurysm not excluded, likely small vascular infundibulum Follow outpatient  Pyuria Follow culture (no growth)  CAD PAD - Holding home Plavix and aspirin in the setting of GI bleed as above - Continue home carvedilol and losartan  Hypertension - Continue home carvedilol and losartan - continue loop diuretics   RLS - Continue home ropinirole  Atrial fibrillation - Continue home carvedilol   Diabetes > Diet controlled - SSI  Polymyalgia Rheumatica - she's on chronic steroids  DVT prophylaxis: SCD Code Status: DNR Family Communication: friend over phone 8/27 Disposition:   Status is: Observation  The patient will require care spanning > 2 midnights and should be moved to inpatient because: Inpatient level of care appropriate due to severity of illness  Dispo: The patient is from: Home              Anticipated d/c is to: Home              Patient currently is not medically stable to d/c.    Difficult to place patient No       Consultants:  GI  Procedures:  Echo IMPRESSIONS     1. Left ventricular ejection fraction, by estimation, is 30 to 35%. The  left ventricle has moderately decreased function. The left ventricle  demonstrates global hypokinesis. Indeterminate diastolic filling due to  E-Corrinna Karapetyan fusion.   2. Right ventricular systolic function is normal. The right ventricular  size is normal. There is normal pulmonary artery systolic pressure.   3. Left atrial size was severely dilated.   4. The mitral valve is grossly normal. Trivial mitral valve  regurgitation.   5. The aortic valve was not well visualized. Aortic valve regurgitation  is not visualized. No aortic stenosis is present.   6. The inferior vena cava is normal in size with greater than 50%  respiratory variability, suggesting right atrial pressure of 3 mmHg.   Comparison(s): Kinslie Hove prior study was performed on 06/16/2017. Prior images  reviewed side by side. Similar LV function.   Antimicrobials:  Anti-infectives (From admission, onward)    Start     Dose/Rate Route Frequency Ordered Stop   04/24/21 1530  cefTRIAXone (ROCEPHIN) 1 g in sodium chloride 0.9 % 100 mL IVPB        1 g 200 mL/hr over 30 Minutes Intravenous  Once 04/24/21 1526 04/24/21 1809   04/24/21 1530  azithromycin (ZITHROMAX) 500 mg in sodium chloride 0.9 % 250 mL IVPB        500 mg 250 mL/hr over 60 Minutes Intravenous  Once 04/24/21 1526 04/24/21 1913       Subjective: No new complaints  Objective: Vitals:   04/28/21 1426 04/28/21 1637 04/28/21 1955 04/28/21 1956  BP:  126/88 (!) 149/71   Pulse:   (!) 108   Resp:   18   Temp:   98.5 F (36.9 C)   TempSrc:   Oral   SpO2: 98%  100% 98%  Weight:      Height:        Intake/Output Summary (Last 24 hours) at 04/28/2021 2042 Last data filed at 04/28/2021 1310 Gross per 24 hour  Intake 843 ml  Output 477 ml  Net 366 ml    Filed Weights   04/26/21 0441 04/27/21 0500  04/28/21 0442  Weight: 42.5 kg 43.2 kg 41.1 kg    Examination:  General: No acute distress. Cardiovascular: RRR Lungs: unlabored Abdomen: Soft, nontender, nondistended  Neurological: L ptosis Skin: Warm and dry. No rashes or lesions. Extremities: No clubbing or cyanosis. No edema.     Data Reviewed: I have personally reviewed following labs and imaging studies  CBC: Recent Labs  Lab 04/24/21 1527 04/24/21 1538 04/25/21 0345 04/26/21 1754 04/27/21 0120 04/28/21 0113  WBC 8.3  --  8.8 9.3 12.1* 13.6*  NEUTROABS 6.6  --   --   --  10.5* 11.7*  HGB 6.5* 7.5* 7.7* 8.0* 7.6* 7.7*  HCT 24.5* 22.0* 26.8* 27.9* 26.9* 26.9*  MCV 76.8*  --  75.3* 75.8* 76.0* 75.1*  PLT 544*  --  470* 509* 494* 485*    Basic Metabolic Panel: Recent Labs  Lab 04/24/21 1527 04/24/21 1538 04/24/21 2224 04/25/21 0345 04/26/21 1754 04/27/21 0120 04/28/21 0113  NA 144 142  --  141 137 140 138  K 3.8 3.8  --  3.8 3.6 3.1* 3.5  CL 106 103  --  101 97* 100 97*  CO2 31  --   --  31 30 33* 32  GLUCOSE 177* 171*  --  97 390* 173* 183*  BUN 11 10  --  14 22 24* 27*  CREATININE 1.01* 0.90  --  1.08* 1.16* 1.14* 1.27*  CALCIUM 8.8*  --   --  8.6* 8.3* 8.5* 8.5*  MG  --   --  1.9  --   --  1.9 2.0  PHOS  --   --   --   --   --  2.7 2.7    GFR: Estimated Creatinine Clearance: 24.5 mL/min (Isahi Godwin) (by C-G formula based on SCr of 1.27 mg/dL (H)).  Liver Function Tests: Recent Labs  Lab 04/24/21 1527 04/25/21 0345 04/26/21 1754 04/27/21 0120 04/28/21 0113  AST 15 12* 14* 11* 14*  ALT 14 14 12 10 13   ALKPHOS 49 48 45 44 44  BILITOT 0.6 1.2 0.5 0.5 0.6  PROT 6.0* 5.8* 5.5* 5.2* 5.4*  ALBUMIN 3.4* 3.2* 3.0* 2.9* 3.1*    CBG: Recent Labs  Lab 04/27/21 2115 04/28/21 0739 04/28/21 1121 04/28/21 1238 04/28/21 1626  GLUCAP 211* 134* 200* 227* 320*     Recent Results (from the past 240 hour(s))  Culture, blood (routine x 2)     Status: None (Preliminary result)   Collection Time: 04/24/21   3:27 PM   Specimen: BLOOD RIGHT ARM  Result Value Ref Range Status   Specimen Description BLOOD RIGHT ARM  Final   Special Requests   Final    BOTTLES DRAWN AEROBIC AND ANAEROBIC Blood Culture results may not be optimal due to an inadequate volume of blood received in culture bottles   Culture   Final    NO GROWTH 4 DAYS Performed at [facility] [address] [place], [place] [address]    Report Status PENDING  Incomplete  Resp Panel by RT-PCR (Flu Bron Snellings&B, Covid) Nasopharyngeal Swab     Status: None   Collection Time: 04/24/21  3:39 PM   Specimen: Nasopharyngeal Swab; Nasopharyngeal(NP) swabs in vial transport medium  Result Value Ref Range Status   SARS Coronavirus 2 by RT PCR NEGATIVE NEGATIVE Final    Comment: (NOTE) SARS-CoV-2 target nucleic acids are NOT DETECTED.  The SARS-CoV-2 RNA is generally detectable in upper respiratory specimens during the acute phase of infection. The lowest concentration of SARS-CoV-2 viral copies this assay can detect is 138 copies/mL. [place] Agostinelli negative result does not preclude SARS-Cov-2 infection and should not be used as the sole basis for treatment or other patient management decisions. Denver Harder negative result may occur with  improper specimen collection/handling, submission of specimen other than nasopharyngeal swab, presence of viral mutation(s) within the areas targeted by this assay, and inadequate number of viral copies(<138 copies/mL). Yazmin Locher negative result must be combined with clinical observations, patient history, and epidemiological information. The expected result is Negative.  Fact Sheet for Patients:  EntrepreneurPulse.com.au  Fact Sheet for Healthcare Providers:  IncredibleEmployment.be  This test is no t yet approved or cleared by the [place] FDA and  has been authorized for detection and/or diagnosis of SARS-CoV-2 by FDA under an Emergency Use Authorization (EUA). This EUA will remain  in  effect (meaning this test can be used) for the duration of the COVID-19 declaration under Section 564(b)(1) of the Act, 21 U.S.C.section 360bbb-3(b)(1), unless the authorization is terminated  or revoked sooner.       Influenza Ruthel Martine by PCR NEGATIVE NEGATIVE Final   Influenza B by PCR NEGATIVE NEGATIVE Final    Comment: (NOTE) The Xpert Xpress SARS-CoV-2/FLU/RSV plus assay is intended as an aid in the diagnosis of influenza from Nasopharyngeal swab specimens and should not be used as Jowell Bossi sole basis for treatment. Nasal washings and aspirates are unacceptable for Xpert Xpress SARS-CoV-2/FLU/RSV testing.  Fact Sheet for Patients: EntrepreneurPulse.com.au  Fact Sheet for Healthcare Providers: IncredibleEmployment.be  This test is not yet approved or cleared by the [place] FDA and has been authorized for detection and/or diagnosis of SARS-CoV-2 by FDA under an Emergency Use Authorization (EUA). This EUA will remain in effect (meaning this test can be used) for the duration of the COVID-19 declaration under Section 564(b)(1) of the Act, 21 U.S.C. section 360bbb-3(b)(1), unless the authorization is terminated or revoked.  Performed at [facility], [place] [address] [place], [place] [address]   Culture, blood (routine x 2)     Status: None (Preliminary result)   Collection Time: 04/24/21 10:24 PM   Specimen: BLOOD  Result Value Ref Range Status   Specimen Description BLOOD LEFT ANTECUBITAL  Final   Special Requests AEROBIC BOTTLE ONLY Blood Culture adequate volume  Final   Culture   Final    NO GROWTH 4 DAYS Performed at [facility], [place] [address], [place], [place] [address]    Report Status PENDING  Incomplete  Urine Culture  Status: None   Collection Time: 04/25/21  5:02 AM   Specimen: Urine, Clean Catch  Result Value Ref Range Status   Specimen Description URINE, CLEAN CATCH  Final   Special Requests NONE  Final    Culture   Final    NO GROWTH Performed at [facility], [address] [place], [place] [address]    Report Status 04/26/2021 FINAL  Final         Radiology Studies: CT CHEST ABDOMEN PELVIS W CONTRAST  Result Date: 04/27/2021 CLINICAL DATA:  Weakness. EXAM: CT CHEST, ABDOMEN, AND PELVIS WITH CONTRAST TECHNIQUE: Multidetector CT imaging of the chest, abdomen and pelvis was performed following the standard protocol during bolus administration of intravenous contrast. CONTRAST:  28m OMNIPAQUE IOHEXOL 350 MG/ML SOLN COMPARISON:  August 05, 2020.  September 22, 2017. FINDINGS: CT CHEST FINDINGS Cardiovascular: Atherosclerosis of thoracic aorta is noted without aneurysm or dissection. Normal cardiac size. No pericardial effusion. Coronary artery calcifications are noted. Mediastinum/Nodes: No enlarged mediastinal, hilar, or axillary lymph nodes. Thyroid gland, trachea, and esophagus demonstrate no significant findings. Lungs/Pleura: No pneumothorax is noted. Emphysematous disease is noted bilaterally. Mild bibasilar subsegmental atelectasis is noted. Minimal left pleural effusion is noted. Musculoskeletal: Multiple old bilateral rib fractures are noted. Old sternal fracture is noted. Status post kyphoplasty of lower thoracic vertebral body. No acute abnormality is noted. CT ABDOMEN PELVIS FINDINGS Hepatobiliary: Stable low densities are noted in the left and right hepatic lobes most consistent with hemangiomas. Status post cholecystectomy. No biliary dilatation is noted. Pancreas: Unremarkable. No pancreatic ductal dilatation or surrounding inflammatory changes. Spleen: Normal in size without focal abnormality. Adrenals/Urinary Tract: Adrenal glands are unremarkable. Mild bilateral hydroureteronephrosis is noted. Moderate urinary bladder distention is noted. No obstructing calculus is noted. Stomach/Bowel: Stomach appears normal. There is no evidence of bowel obstruction or inflammation. Status post  appendectomy. Large amount of stool seen throughout the colon. Vascular/Lymphatic: Aortic atherosclerosis. No enlarged abdominal or pelvic lymph nodes. Reproductive: Status post hysterectomy. No adnexal masses. Other: No abdominal wall hernia or abnormality. No abdominopelvic ascites. Musculoskeletal: No acute or significant osseous findings. IMPRESSION: Mild bilateral posterior basilar subsegmental atelectasis is noted. Small left pleural effusion is noted. Mild bilateral hydroureteronephrosis is noted without obstructing calculus, potentially due to moderate urinary bladder distention. Multiple old bilateral rib and sternal fractures are noted. Coronary artery calcifications are noted suggesting coronary artery disease. Aortic Atherosclerosis (ICD10-I70.0) and Emphysema (ICD10-J43.9). Electronically Signed   By: JMarijo ConceptionM.D.   On: 04/27/2021 16:53        Scheduled Meds:  insulin aspart  0-5 Units Subcutaneous QHS   insulin aspart  0-9 Units Subcutaneous TID WC   ipratropium-albuterol  3 mL Nebulization TID   lidocaine  1 patch Transdermal Q24H   losartan  50 mg Oral QPM   mometasone-formoterol  2 puff Inhalation BID   pantoprazole  40 mg Oral Daily   [START ON 04/30/2021] predniSONE  10 mg Oral Q breakfast   predniSONE  40 mg Oral Q breakfast   [START ON 04/29/2021] pyridostigmine  30 mg Oral TID   sodium chloride flush  3 mL Intravenous Q12H   torsemide  20 mg Oral Daily   Continuous Infusions:  sodium chloride       LOS: 3 days    Time spent: over 30 min    CFayrene Helper MD [facility]   To contact the attending provider between 7A-7P or the covering provider during after hours 7P-7A, please log into  the web site [email or web address] and access using universal [place] password for that web site. If you do not have the password, please call the hospital operator.  04/28/2021, 8:42 PM

## 2021-04-28 NOTE — Evaluation (Addendum)
Occupational Therapy Evaluation Patient Details Name: Kimberly Downs MRN: [id number] DOB: 06-Oct-1945 Today's Date: 04/28/2021    History of Present Illness 75 yo female presenting with shortness of breath. Also reporting two week history of diffuse weakness with intermittent double vision and acute attacks of neck weakness.  Plain films of neck notable for osteopenia - upper thoracic and L rib fx.  Work up ongoing for Myasthenia Gravis. PMH including  GI bleed, anemia, atrial fibrillation, bronchiectasis, CHF, C. difficile, COPD, CAD status post stenting, depression, hypertension, ICD placement, PAD, diabetes, restless leg syndrome   Clinical Impression   PTA, pt was independent with ADLs and using a rollator; pt staying at a friends home with the plan to move to [place] and live with family. Pt currently requiring Min Guard-Min A for LB ADLs and functional mobility with RW. Pt presenting with decreased balance, strength, and activity tolerance. Pt very motivated to participate in therapy and return to independence. Pt would benefit from further acute OT to facilitate safe dc. Recommend dc to home with HHOT for further OT to optimize safety, independence with ADLs, and return to PLOF.     Follow Up Recommendations  Home health OT    Equipment Recommendations  None recommended by OT    Recommendations for Other Services PT consult     Precautions / Restrictions Precautions Precautions: Fall Required Braces or Orthoses: Cervical Brace Cervical Brace: Soft collar;For comfort      Mobility Bed Mobility Overal bed mobility: Needs Assistance Bed Mobility: Supine to Sit;Sit to Supine     Supine to sit: Supervision Sit to supine: Supervision   General bed mobility comments: Supervision for safety    Transfers Overall transfer level: Needs assistance Equipment used: Rolling walker (2 wheeled) Transfers: Sit to/from Stand Sit to Stand: Min guard         General transfer comment:  Min Guard A for safety    Balance Overall balance assessment: Needs assistance;History of Falls   Sitting balance-Leahy Scale: Good     Standing balance support: Bilateral upper extremity supported Standing balance-Leahy Scale: Poor Standing balance comment: UE support for balance, pt with history of falls, has neuropathy and now increased LE weakness and intermittent visual deficits                           ADL either performed or assessed with clinical judgement   ADL Overall ADL's : Needs assistance/impaired Eating/Feeding: Set up;Bed level   Grooming: Minimal assistance;Wash/dry hands;Standing Grooming Details (indicate cue type and reason): Min A for balance due to neck instability and poor balance Upper Body Bathing: Set up;Supervision/ safety;Sitting   Lower Body Bathing: Minimal assistance;Sit to/from stand   Upper Body Dressing : Supervision/safety;Set up;Sitting   Lower Body Dressing: Minimal assistance;Sit to/from stand   Toilet Transfer: Min guard;Ambulation;Regular Toilet;Grab bars;RW Armed forces technical officer Details (indicate cue type and reason): Min Guard A for safety         Functional mobility during ADLs: Minimal assistance;Rolling walker General ADL Comments: Pt presenting with decreased balance, strength, and activity toelrance     Vision Baseline Vision/History: 1 Wears glasses Patient Visual Report: Other (comment) (Two weeks of random diplopia; no episodes during session)       Perception     Praxis      Pertinent Vitals/Pain Pain Assessment: Faces Faces Pain Scale: Hurts little more Pain Location: neck Pain Descriptors / Indicators: Aching Pain Intervention(s): Monitored during session;Limited activity within patient's  tolerance;Repositioned     Hand Dominance Right   Extremity/Trunk Assessment Upper Extremity Assessment Upper Extremity Assessment: Overall WFL for tasks assessed RUE Deficits / Details: AROM WFL, strength grossly  4/5 RUE Sensation: history of peripheral neuropathy LUE Deficits / Details: AROM WFL strength grossly 4-/5 LUE Sensation: history of peripheral neuropathy   Lower Extremity Assessment Lower Extremity Assessment: Defer to PT evaluation RLE Deficits / Details: AROM WFL, strength hip flexion 4-/5 knee extension 4/5, flexion 4-/5, ankle DF 4/5 RLE Sensation: history of peripheral neuropathy LLE Deficits / Details: AROM WFL, strength hip flexion 3/5, knee extension 4-/5, flexion 3/5, ankle DF 4-/5 LLE Sensation: history of peripheral neuropathy   Cervical / Trunk Assessment Cervical / Trunk Assessment: Kyphotic;Other exceptions Cervical / Trunk Exceptions: wearing soft cervical collar initially, but removed due to itching, able to keep head up for supine to sit and sit to stand, short distance ambulation and transfer to chair   Communication Communication Communication: No difficulties   Cognition Arousal/Alertness: Awake/alert Behavior During Therapy: WFL for tasks assessed/performed Overall Cognitive Status: Within Functional Limits for tasks assessed                                 General Comments: Very motivated   General Comments  SpO2 100% on 3L during activity. HR elevating to 120s    Exercises Other Exercises Other Exercises: demonstrated sitting threx including hip flexion, LAQ's and hip abduction she does on her own (performed just to show what she does) Other Exercises: sit<>stand x 5 Other Exercises: seated hip adductor squeezes w/ 5 sec hold x 10 reps Other Exercises: seated trunk flex/ext x 5   Shoulder Instructions      Home Living Family/patient expects to be discharged to:: Private residence Living Arrangements: Alone Available Help at Discharge: Neighbor;Available PRN/intermittently Type of Home: Apartment Home Access: Elevator     Home Layout: One level     Bathroom Shower/Tub: Occupational psychologist: Handicapped height      Home Equipment: Hand held shower head;Shower seat;Walker - 4 wheels;Grab bars - tub/shower;Cane - single point;Electric scooter;Other (comment)   Additional Comments: Plans on moving to [place] with family      Prior Functioning/Environment Level of Independence: Independent with assistive device(s)        Comments: walks with rollator, has electric scooter when out, plans to go back to [place] to live where she has family        OT Problem List: Decreased strength;Decreased range of motion;Decreased activity tolerance;Impaired balance (sitting and/or standing);Pain      OT Treatment/Interventions: Self-care/ADL training;Therapeutic exercise;Energy conservation;DME and/or AE instruction;Therapeutic activities;Patient/family education;Balance training    OT Goals(Current goals can be found in the care plan section) Acute Rehab OT Goals Patient Stated Goal: to move to [place] with family OT Goal Formulation: With patient Time For Goal Achievement: 05/12/21 Potential to Achieve Goals: Good  OT Frequency: Min 2X/week   Barriers to D/C:            Co-evaluation              AM-PAC OT "6 Clicks" Daily Activity     Outcome Measure Help from another person eating meals?: None Help from another person taking care of personal grooming?: A Little Help from another person toileting, which includes using toliet, bedpan, or urinal?: A Little Help from another person bathing (including washing, rinsing, drying)?: A Little Help from another  person to put on and taking off regular upper body clothing?: A Little Help from another person to put on and taking off regular lower body clothing?: A Little 6 Click Score: 19   End of Session Equipment Utilized During Treatment: Rolling walker;Oxygen Nurse Communication: Mobility status  Activity Tolerance: Patient tolerated treatment well Patient left: in bed;with call bell/phone within reach  OT Visit Diagnosis: Unsteadiness on feet (R26.81);Other  abnormalities of gait and mobility (R26.89);Muscle weakness (generalized) (M62.81)                Time: 1030-1314 OT Time Calculation (min): 25 min Charges:  OT General Charges $OT Visit: 1 Visit OT Evaluation $OT Eval Moderate Complexity: 1 Mod OT Treatments $Self Care/Home Management : 8-22 mins  Iness Pangilinan MSOT, OTR/L Acute Rehab Pager: [phone] Office: [place] 04/28/2021, 4:33 PM

## 2021-04-28 NOTE — Evaluation (Signed)
Physical Therapy Evaluation Patient Details Name: Kimberly Downs MRN: [id number] DOB: [birth year] Today's Date: 04/28/2021   History of Present Illness  Kimberly Downs is a 75 y.o. female with medical history significant of GI bleed, anemia, atrial fibrillation, bronchiectasis, CHF, C. difficile, COPD, CAD status post stenting, depression, hypertension, ICD placement, PAD, diabetes, restless leg syndrome who presents with ongoing shortness of breath, 2 week history of diffuse weakness with intermittent double vision and acute attacks of neck weakness.  Plain films of neck notable for osteopenia - upper thoracic and L rib fx.  Work up ongoing for Myasthenia Gravis.  Clinical Impression  Patient presents with decreased mobility due to LE weakness and poor overall balance previous to this episode with history of falls.  She reports using rollator at home and electric scooter in community.  She also reports plans to move to [place] to live with family.  Depending on when family comes to help her transition she will need HHPT and assist for safe mobility in the home.  PT to continue to follow acutely.     Follow Up Recommendations Home health PT;Supervision for mobility/OOB    Equipment Recommendations  None recommended by PT    Recommendations for Other Services       Precautions / Restrictions Precautions Precautions: Fall Required Braces or Orthoses: Cervical Brace Cervical Brace: Soft collar;For comfort      Mobility  Bed Mobility   Bed Mobility: Supine to Sit     Supine to sit: Modified independent (Device/Increase time)          Transfers Overall transfer level: Needs assistance Equipment used: Rolling walker (2 wheeled) Transfers: Sit to/from Stand Sit to Stand: Supervision         General transfer comment: able to stand with assist for safety; stood x 5 for LE strengthening  Ambulation/Gait Ambulation/Gait assistance: Min guard Gait Distance (Feet): 12  Feet Assistive device: Rolling walker (2 wheeled) Gait Pattern/deviations: Step-through pattern;Decreased stride length;Shuffle     General Gait Details: mild staggering forward with feet, but pt not used to standard RW, used rollator at home, limited distance due to O2 line, walked to window, then turned and walked to chair. minguard needed for balance  Stairs            Wheelchair Mobility    Modified Rankin (Stroke Patients Only)       Balance Overall balance assessment: Needs assistance;History of Falls   Sitting balance-Leahy Scale: Good     Standing balance support: Bilateral upper extremity supported Standing balance-Leahy Scale: Poor Standing balance comment: UE support for balance, pt with history of falls, has neuropathy and now increased LE weakness and intermittent visual deficits                             Pertinent Vitals/Pain Pain Assessment: 0-10 Pain Score: 8  Pain Location: neck Pain Descriptors / Indicators: Aching Pain Intervention(s): Monitored during session;Premedicated before session    Home Living Family/patient expects to be discharged to:: Private residence Living Arrangements: Alone   Type of Home: Apartment Home Access: Elevator     Home Layout: One level Home Equipment: Hand held shower head;Shower seat;Walker - 4 wheels;Grab bars - tub/shower;Cane - single point;Electric scooter;Other (comment) Additional Comments: oxygen    Prior Function Level of Independence: Independent with assistive device(s)         Comments: walks with rollator, has electric scooter when out, plans to go  back to [place] to live where she has family     Hand Dominance   Dominant Hand: Right    Extremity/Trunk Assessment   Upper Extremity Assessment Upper Extremity Assessment: RUE deficits/detail;LUE deficits/detail RUE Deficits / Details: AROM WFL, strength grossly 4/5 RUE Sensation: history of peripheral neuropathy LUE Deficits /  Details: AROM WFL strength grossly 4-/5 LUE Sensation: history of peripheral neuropathy    Lower Extremity Assessment Lower Extremity Assessment: RLE deficits/detail;LLE deficits/detail RLE Deficits / Details: AROM WFL, strength hip flexion 4-/5 knee extension 4/5, flexion 4-/5, ankle DF 4/5 RLE Sensation: history of peripheral neuropathy LLE Deficits / Details: AROM WFL, strength hip flexion 3/5, knee extension 4-/5, flexion 3/5, ankle DF 4-/5 LLE Sensation: history of peripheral neuropathy    Cervical / Trunk Assessment Cervical / Trunk Assessment: Kyphotic;Other exceptions Cervical / Trunk Exceptions: wearing soft cervical collar initially, but removed due to itching, able to keep head up for supine to sit and sit to stand, short distance ambulation and transfer to chair  Communication   Communication: No difficulties  Cognition Arousal/Alertness: Awake/alert Behavior During Therapy: WFL for tasks assessed/performed Overall Cognitive Status: Within Functional Limits for tasks assessed                                        General Comments General comments (skin integrity, edema, etc.): on 2L O2 SpO2 95-98%    Exercises Other Exercises Other Exercises: demonstrated sitting threx including hip flexion, LAQ's and hip abduction she does on her own (performed just to show what she does) Other Exercises: sit<>stand x 5 Other Exercises: seated hip adductor squeezes w/ 5 sec hold x 10 reps Other Exercises: seated trunk flex/ext x 5   Assessment/Plan    PT Assessment Patient needs continued PT services  PT Problem List Decreased strength;Decreased mobility;Decreased safety awareness;Decreased balance;Decreased knowledge of use of DME;Cardiopulmonary status limiting activity       PT Treatment Interventions DME instruction;Therapeutic activities;Therapeutic exercise;Gait training;Functional mobility training;Balance training;Patient/family education    PT Goals  (Current goals can be found in the Care Plan section)  Acute Rehab PT Goals Patient Stated Goal: to move to [place] with family PT Goal Formulation: With patient Time For Goal Achievement: 05/12/21 Potential to Achieve Goals: Good    Frequency Min 3X/week   Barriers to discharge        Co-evaluation               AM-PAC PT "6 Clicks" Mobility  Outcome Measure Help needed turning from your back to your side while in a flat bed without using bedrails?: None Help needed moving from lying on your back to sitting on the side of a flat bed without using bedrails?: None Help needed moving to and from a bed to a chair (including a wheelchair)?: A Little Help needed standing up from a chair using your arms (e.g., wheelchair or bedside chair)?: A Little Help needed to walk in hospital room?: A Little Help needed climbing 3-5 steps with a railing? : A Little 6 Click Score: 20    End of Session Equipment Utilized During Treatment: Oxygen Activity Tolerance: Patient tolerated treatment well Patient left: in chair;with call bell/phone within reach;with chair alarm set;Other (comment) (geomat in chair)   PT Visit Diagnosis: Other abnormalities of gait and mobility (R26.89);History of falling (Z91.81);Muscle weakness (generalized) (M62.81)    Time: 1696-7893 PT Time Calculation (min) (ACUTE ONLY):  21 min   Charges:   PT Evaluation $PT Eval Moderate Complexity: 1 Mod          Cyndi Arsen Mangione, PT Acute Rehabilitation Services [id number]:[phone] Office:[phone] 04/28/2021   Reginia Naas 04/28/2021, 1:25 PM

## 2021-04-28 NOTE — Progress Notes (Signed)
NIF - 24 Great patient effort.

## 2021-04-29 DIAGNOSIS — G7000 Myasthenia gravis without (acute) exacerbation: Secondary | ICD-10-CM

## 2021-04-29 LAB — CBC WITH DIFFERENTIAL/PLATELET
Abs Immature Granulocytes: 0.07 10*3/uL (ref 0.00–0.07)
Basophils Absolute: 0 10*3/uL (ref 0.0–0.1)
Basophils Relative: 0 %
Eosinophils Absolute: 0 10*3/uL (ref 0.0–0.5)
Eosinophils Relative: 0 %
HCT: 26.5 % — ABNORMAL LOW (ref 36.0–46.0)
Hemoglobin: 7.5 g/dL — ABNORMAL LOW (ref 12.0–15.0)
Immature Granulocytes: 1 %
Lymphocytes Relative: 8 %
Lymphs Abs: 1 10*3/uL (ref 0.7–4.0)
MCH: 21.2 pg — ABNORMAL LOW (ref 26.0–34.0)
MCHC: 28.3 g/dL — ABNORMAL LOW (ref 30.0–36.0)
MCV: 75.1 fL — ABNORMAL LOW (ref 80.0–100.0)
Monocytes Absolute: 0.8 10*3/uL (ref 0.1–1.0)
Monocytes Relative: 7 %
Neutro Abs: 10 10*3/uL — ABNORMAL HIGH (ref 1.7–7.7)
Neutrophils Relative %: 84 %
Platelets: 461 10*3/uL — ABNORMAL HIGH (ref 150–400)
RBC: 3.53 MIL/uL — ABNORMAL LOW (ref 3.87–5.11)
RDW: 17 % — ABNORMAL HIGH (ref 11.5–15.5)
WBC: 11.9 10*3/uL — ABNORMAL HIGH (ref 4.0–10.5)
nRBC: 0 % (ref 0.0–0.2)

## 2021-04-29 LAB — CULTURE, BLOOD (ROUTINE X 2)
Culture: NO GROWTH
Culture: NO GROWTH
Special Requests: ADEQUATE

## 2021-04-29 LAB — GLUCOSE, CAPILLARY
Glucose-Capillary: 130 mg/dL — ABNORMAL HIGH (ref 70–99)
Glucose-Capillary: 184 mg/dL — ABNORMAL HIGH (ref 70–99)
Glucose-Capillary: 322 mg/dL — ABNORMAL HIGH (ref 70–99)
Glucose-Capillary: 182 mg/dL — ABNORMAL HIGH (ref 70–99)

## 2021-04-29 LAB — COMPREHENSIVE METABOLIC PANEL
ALT: 16 U/L (ref 0–44)
AST: 17 U/L (ref 15–41)
Albumin: 3.2 g/dL — ABNORMAL LOW (ref 3.5–5.0)
Alkaline Phosphatase: 44 U/L (ref 38–126)
Anion gap: 8 (ref 5–15)
BUN: 36 mg/dL — ABNORMAL HIGH (ref 8–23)
CO2: 34 mmol/L — ABNORMAL HIGH (ref 22–32)
Calcium: 9 mg/dL (ref 8.9–10.3)
Chloride: 98 mmol/L (ref 98–111)
Creatinine, Ser: 1.31 mg/dL — ABNORMAL HIGH (ref 0.44–1.00)
GFR, Estimated: 42 mL/min — ABNORMAL LOW (ref >60)
Glucose, Bld: 119 mg/dL — ABNORMAL HIGH (ref 70–99)
Potassium: 3.9 mmol/L (ref 3.5–5.1)
Sodium: 140 mmol/L (ref 135–145)
Total Bilirubin: 0.8 mg/dL (ref 0.3–1.2)
Total Protein: 5.5 g/dL — ABNORMAL LOW (ref 6.5–8.1)

## 2021-04-29 LAB — PHOSPHORUS: Phosphorus: 4.4 mg/dL (ref 2.5–4.6)

## 2021-04-29 LAB — MAGNESIUM: Magnesium: 2.1 mg/dL (ref 1.7–2.4)

## 2021-04-29 LAB — TSH: TSH: 0.332 u[IU]/mL — ABNORMAL LOW (ref 0.350–4.500)

## 2021-04-29 LAB — T4, FREE: Free T4: 1.22 ng/dL — ABNORMAL HIGH (ref 0.61–1.12)

## 2021-04-29 MED ORDER — HYDRALAZINE HCL 25 MG PO TABS
25.0000 mg | ORAL_TABLET | Freq: Three times a day (TID) | ORAL | Status: DC
  Administered 2021-04-29 – 2021-05-05 (×17): 25 mg via ORAL
  Filled 2021-04-29 (×17): qty 1

## 2021-04-29 MED ORDER — PREDNISONE 10 MG PO TABS
10.0000 mg | ORAL_TABLET | Freq: Every day | ORAL | Status: DC
  Administered 2021-04-30 – 2021-05-05 (×6): 10 mg via ORAL
  Filled 2021-04-29 (×6): qty 1

## 2021-04-29 MED ORDER — SODIUM CHLORIDE 0.9 % IV SOLN
250.0000 mg | Freq: Every day | INTRAVENOUS | Status: AC
  Administered 2021-05-01 – 2021-05-02 (×2): 250 mg via INTRAVENOUS
  Filled 2021-04-29 (×4): qty 20

## 2021-04-29 MED ORDER — LORAZEPAM 2 MG/ML IJ SOLN
1.0000 mg | Freq: Every day | INTRAMUSCULAR | Status: DC | PRN
  Administered 2021-05-01: 1 mg via INTRAVENOUS
  Filled 2021-04-29: qty 1

## 2021-04-29 MED ORDER — PREDNISONE 10 MG PO TABS: 10.0000 mg | ORAL_TABLET | Freq: Every day | ORAL | Status: DC
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
## 2021-04-29 NOTE — Progress Notes (Signed)
NIF -30. Good pt effort

## 2021-04-29 NOTE — Progress Notes (Signed)
Patient requesting RN to remove newly placed IV. Patient states that the position of the IV is hurting her. RN requested that patient allow IV team to assess the IV before removal which patient declined. RN educated patient that she would not be able to receive ordered medications without IV access patient stated that she understood and still requested RN to remove IV. RN removed IV per patient request and notified Dr. Broadus John.

## 2021-04-29 NOTE — Progress Notes (Signed)
Physical Therapy Treatment Patient Details Name: Kimberly Downs MRN: [id number] DOB: [birth year] Today's Date: 04/29/2021    History of Present Illness Pt is a 75 y.o. female admitted 04/24/21 with ongoing SOB and 2-wk h/o diffuse weakness, intermittent double vision, neck weakness. Imaging notivate for osteopenia, upper thoracic fx, L rib fx. Workup for CHF/COPD exacerbation. Other symptoms favored to be secondary to neuromuscular junction disorder, most likely myasthenia gravis; workup underway. PMH includes afib, CHF, COPD, CAD, HTN, ICD, PAD, DM, RLS, depression.   PT Comments    Pt progressing with mobility, although limited by c/o significant RUE pain secondary to IV this session (RN aware). Pt tolerated transfer and gait training progression with rollator and intermittent min guard for balance. Pt remains limited by generalized weakness, decreased activity tolerance and impaired balance strategies. Will continue to follow acutely.    Follow Up Recommendations  Home health PT;Supervision for mobility/OOB     Equipment Recommendations  None recommended by PT    Recommendations for Other Services       Precautions / Restrictions Precautions Precautions: Fall;Other (comment) Precaution Comments: Urinary incontinence Required Braces or Orthoses: Cervical Brace Cervical Brace: Soft collar;For comfort Restrictions Weight Bearing Restrictions: No    Mobility  Bed Mobility Overal bed mobility: Modified Independent Bed Mobility: Supine to Sit                Transfers Overall transfer level: Needs assistance Equipment used: 1 person hand held assist;4-wheeled walker Transfers: Sit to/from Stand Sit to Stand: Min guard         General transfer comment: Initial min guard for stand pivot from bed to recliner with LUE HHA secondary to painful RUE IV; once removed, pt tolerated standing with rollator and min guard, cues to lock rollator brakes prior to  standing  Ambulation/Gait Ambulation/Gait assistance: Min guard Gait Distance (Feet): 40 Feet Assistive device: 4-wheeled walker Gait Pattern/deviations: Step-through pattern;Decreased stride length;Shuffle;Trunk flexed Gait velocity: Decreased   General Gait Details: Slow, mostly steady gait with rollator and min guard for balance; seated rest secondary to fatigue; deferred additional distance secondary to HTN (BP 160s/100s, RN notified)   Stairs             Wheelchair Mobility    Modified Rankin (Stroke Patients Only)       Balance Overall balance assessment: Needs assistance;History of Falls   Sitting balance-Leahy Scale: Good     Standing balance support: Bilateral upper extremity supported Standing balance-Leahy Scale: Poor                              Cognition Arousal/Alertness: Awake/alert Behavior During Therapy: WFL for tasks assessed/performed;Anxious Overall Cognitive Status: Within Functional Limits for tasks assessed                                 General Comments: Pt initially very distracted by painful RUE IV, difficult to redirect from this; once issue addressed, agreeable and motivated to participate      Exercises      General Comments        Pertinent Vitals/Pain Pain Assessment: Faces Faces Pain Scale: Hurts even more Pain Location: IV in R forearm Pain Descriptors / Indicators: Discomfort;Grimacing;Moaning;Crying Pain Intervention(s): Monitored during session;Other (comment) (RN present to remove IV)    Home Living  Prior Function            PT Goals (current goals can now be found in the care plan section) Progress towards PT goals: Progressing toward goals    Frequency    Min 3X/week      PT Plan Current plan remains appropriate    Co-evaluation              AM-PAC PT "6 Clicks" Mobility   Outcome Measure  Help needed turning from your back to your  side while in a flat bed without using bedrails?: None Help needed moving from lying on your back to sitting on the side of a flat bed without using bedrails?: None Help needed moving to and from a bed to a chair (including a wheelchair)?: A Little Help needed standing up from a chair using your arms (e.g., wheelchair or bedside chair)?: A Little Help needed to walk in hospital room?: A Little Help needed climbing 3-5 steps with a railing? : A Little 6 Click Score: 20    End of Session Equipment Utilized During Treatment: Gait belt;Oxygen Activity Tolerance: Patient tolerated treatment well;Treatment limited secondary to medical complications (Comment) Patient left: in chair;with call bell/phone within reach;with chair alarm set Nurse Communication: Mobility status PT Visit Diagnosis: Other abnormalities of gait and mobility (R26.89);History of falling (Z91.81);Muscle weakness (generalized) (M62.81)     Time: 8676-7209 PT Time Calculation (min) (ACUTE ONLY): 27 min  Charges:  $Gait Training: 8-22 mins $Therapeutic Activity: 8-22 mins                     Mabeline Caras, PT, DPT Acute Rehabilitation Services  Pager [phone] Office [place] 04/29/2021, 3:05 PM

## 2021-04-29 NOTE — Progress Notes (Signed)
Neurology Progress Note  Patient ID: 75 year old female with a 2 week history of diffuse weakness with intermittent double vision and acute attacks of neck weakness favored to be 2/2 neuromuscular junction disorder, most likely myasthenia gravis.  Subjective: Patient had good response to pyridostigmine specifically with improvement in head drop. No side effects. Ab panels pending.NIF this afternoon -24.  Objective:  Vitals:   04/29/21 0729 04/29/21 0731  BP:    Pulse:    Resp:    Temp:    SpO2: 100% 100%    Physical Exam  HEENT-  [place]/AT   Lungs- Respirations unlabored. No tachypnea. No visible use of accessory muscles of respiration.  Abdomen- Nondistended Extremities- Warm and well perfused. Thinning skin with multiple old subcutaneous hemorrhagic lesions.      Neurological Examination Mental Status: Awake, alert and oriented. Speech fluent without evidence of aphasia.  Able to follow all commands without difficulty. Pleasant and cooperative with good recall for recent events.  Cranial Nerves: II: Temporal visual fields intact without extinction to DSS. PERRL.   III,IV, VI: Mild ptosis at baseline. When asked to look upwards for > 30 seconds, the patient's R eye drifts downward by about 2 mm, she experiences double vision. Prior to the above, eyes were conjugate and she was able to gaze to the far right and left as well as up and down without difficulty. No nystagmus.  V,VII: Smile symmetric without weakness. Facial temp sensation subjectively decreased bilaterally.  VIII: Hearing intact to conversation.  IX,X: No pharyngeal dysarthria or hypophonia XI: Neck flexion 4/5, neck rotation 4+/5 to right and left. Neck extension 4-5. Shoulder shrug is symmetric.  XII: Midline tongue extension. No atrophy or fasciculations noted.  Motor: BUE 4+/5 proximally and distally in the context of diffusely decreased muscle bulk.  No pronator drift . BLE 4/5 proximally and distally in the context  of diffusely decreased muscle bulk. Fatiguable weakness on repeated muscle testing L hip flexor. Sensory: Subjectively decreased temp sensation to BUE and BLE. FT intact x 4. No extinction to DSS.  Deep Tendon Reflexes: 1-2+ and symmetric throughout Cerebellar: No ataxia with FNF bilaterally Gait: Deferred  Data: no e/o malignancy on CT c/a/p w contrast 8/29  A/P: Assessment: 75 year old female with a 2 week history of diffuse weakness with intermittent double vision and acute attacks of neck weakness.  1. Exam reveals moderate weakness of neck extensors, mild weakness of neck flexion and head rotation to left and right. Also with fatiguability with sustained upgaze resulting in double vision, weakness of left eye supraduction and left sided ptosis. Limb strength is slightly decreased in the context of her diffusely decreased muscle bulk.  2. Most likely etiology for her weakness, given her history, symptoms and exam findings, is an acquired disorder of the neuromuscular junction, such as myasthenia gravis or Lambert-Eaton myasthenic syndrome (aka LEMS, which is a concern given her 20 lb weight loss over the past 4 months).  3. Multiple rib fractures on imaging. May have contributed to the acute worsening of her progressive SOB at home.   4. CT c/a/p with contrast showed no e/o malignancy or thymoma   Recommendations: - Myasthenia gravis panel has been ordered. Added more comprehensive send out panel [facility] test code [id number] Acetylcholine Receptor (AChR) Antibodies, Complete Profile with Reflex to MuSK Antibodies - Anti-voltage-gated calcium channel antibody titer (LEMS) has been ordered. - RT to assess NIF and FVC q 12 hrs - Baclofen and beta-blocker discontinued - Start pyridostigmine  30mg  tid - Outpatient Vascular Surgery follow up regarding CTA finding of extensive atheromatous change about the right carotid bulb/proximal right ICA with associated stenosis of up to 75% by NASCET criteria.  This finding is not felt to be related to her current presentation.  - Further recommendations pending the results of the above tests.  - Will need outpatient Neurology follow up. At that time may benefit from EMG/NCS of the neck musculature.   Su Monks, MD [facility] [phone]  If 7pm- 7am, please page neurology on call as listed in [place].

## 2021-04-29 NOTE — Progress Notes (Signed)
Heart Failure Nurse Navigator Progress Note  Assessed for Heart & Vascular TOC clinic readiness.   Pt currently continuing work up for [facility]. Pt states she doesn't want any appointments right now as she is going to [place] to stay with friends to get stronger. Patient had a difficult time keeping eyes open, stating she "was not sleeping but her eyes are just too heavy". Pt was interactive during interview. Currently residing at [facility] [address] In [place] temporarily. Pt declined [facility] transportation services at this time. Has a rollator at home.   Pricilla Holm, MSN, RN Heart Failure Nurse Navigator [phone]

## 2021-04-29 NOTE — Progress Notes (Signed)
NIF -21 good pt effort

## 2021-04-29 NOTE — Progress Notes (Signed)
Neurology Progress Note  Patient ID: 75 year old female with a 2 week history of diffuse weakness with intermittent double vision and acute attacks of neck weakness favored to be 2/2 neuromuscular junction disorder, most likely myasthenia gravis.  Subjective: Patient c/o mild difficulty managing her secretions today therefore mestinon was d/c'd. Anxious. Report subjective dyspnea but weakness unchanged on exam, VSS, NIF -30 this AM.  Objective:  Vitals:   04/29/21 0815 04/29/21 1334  BP: (!) 162/92 (!) 182/74  Pulse:    Resp:    Temp: 98.5 F (36.9 C) 99 F (37.2 C)  SpO2:  100%    Physical Exam  HEENT-  [place]/AT   Lungs- Respirations unlabored. No tachypnea. No visible use of accessory muscles of respiration.  Abdomen- Nondistended Extremities- Warm and well perfused. Thinning skin with multiple old subcutaneous hemorrhagic lesions.      Neurological Examination Mental Status: Awake, alert and oriented. Speech fluent without evidence of aphasia.  Able to follow all commands without difficulty. Pleasant and cooperative with good recall for recent events.  Cranial Nerves: II: Temporal visual fields intact without extinction to DSS. PERRL.   III,IV, VI: Mild ptosis at baseline. When asked to look upwards for > 30 seconds, the patient's R eye drifts downward by about 2 mm, she experiences double vision. Prior to the above, eyes were conjugate and she was able to gaze to the far right and left as well as up and down without difficulty. No nystagmus.  V,VII: Smile symmetric without weakness. Facial temp sensation subjectively decreased bilaterally.  VIII: Hearing intact to conversation.  IX,X: No pharyngeal dysarthria or hypophonia XI: Neck flexion 4/5, neck rotation 4+/5 to right and left. Neck extension 4-5. Shoulder shrug is symmetric.  XII: Midline tongue extension. No atrophy or fasciculations noted.  Motor: BUE 4+/5 proximally and distally in the context of diffusely decreased  muscle bulk.  No pronator drift . BLE 4/5 proximally and distally in the context of diffusely decreased muscle bulk. Fatiguable weakness on repeated muscle testing L hip flexor. Sensory: Subjectively decreased temp sensation to BUE and BLE. FT intact x 4. No extinction to DSS.  Deep Tendon Reflexes: 1-2+ and symmetric throughout Cerebellar: No ataxia with FNF bilaterally Gait: Deferred  Data: no e/o malignancy on CT c/a/p w contrast 8/29  A/P: Assessment: 75 year old female with a 2 week history of diffuse weakness with intermittent double vision and acute attacks of neck weakness.  1. Exam reveals moderate weakness of neck extensors, mild weakness of neck flexion and head rotation to left and right. Also with fatiguability with sustained upgaze resulting in double vision, weakness of left eye supraduction and left sided ptosis. Limb strength is slightly decreased in the context of her diffusely decreased muscle bulk.  2. Most likely etiology for her weakness, given her history, symptoms and exam findings, is an acquired disorder of the neuromuscular junction, such as myasthenia gravis or Lambert-Eaton myasthenic syndrome (aka LEMS, which is a concern given her 20 lb weight loss over the past 4 months).  3. Multiple rib fractures on imaging. May have contributed to the acute worsening of her progressive SOB at home.   4. CT c/a/p with contrast showed no e/o malignancy or thymoma   Recommendations: - Myasthenia gravis panel has been ordered. Added more comprehensive send out panel [facility] test code [id number] Acetylcholine Receptor (AChR) Antibodies, Complete Profile with Reflex to MuSK Antibodies - Anti-voltage-gated calcium channel antibody titer (LEMS) has been ordered. - RT to assess NIF  and FVC q 12 hrs - Baclofen and beta-blocker discontinued - Mestinon d/c'd 2/2 increased secretions - Outpatient Vascular Surgery follow up regarding CTA finding of extensive atheromatous change about the right  carotid bulb/proximal right ICA with associated stenosis of up to 75% by NASCET criteria. This finding is not felt to be related to her current presentation.  - Further recommendations pending the results of the above tests.  - Will need outpatient Neurology follow up. At that time may benefit from EMG/NCS of the neck musculature.   Su Monks, MD [facility] [phone]  If 7pm- 7am, please page neurology on call as listed in [place].

## 2021-04-29 NOTE — Progress Notes (Signed)
Sitting PROGRESS NOTE    Kimberly Downs  WJX:[id number] DOB: 1946-03-13 DOA: 04/24/2021 PCP: Lyman Bishop, DO   Chief Complaint  Patient presents with   Shortness of Breath   Brief Narrative:  Kimberly Downs is a 75 y.o. female with medical history significant of GI bleed, anemia, atrial fibrillation, bronchiectasis, CHF, C. difficile, COPD, CAD status post stenting, depression, hypertension, ICD placement, PAD, diabetes, restless leg syndrome who presents with ongoing shortness of breath.  She complained of neck weakness and difficulty ambulating over the past 1.5 weeks when her symptoms started.    She's suspected to have myasthenia.  Neurology following and guiding care Also noted to have chronic iron deficiency anemia, seen by gastroenterology this admission, recommended outpatient follow-up for this  Assessment & Plan:   Diffuse weakness, double vision, neck Weakness -friend noted she last walked 1.5 weeks ago and neck weakness started around then as well.  When head drops with neck in flexed position she has trouble breathing.  -On exam she is noted to have moderate neck extensor weakness, mild weakness Flexion and rotation, easy fatigability, double vision, left-sided ptosis -Neurology consulting, myasthenia gravis versus Lambert-Eaton myasthenic syndrome suspected -CT chest abdomen pelvis without evidence of malignancy or thymoma -Myasthenia gravis panel ordered and pending at time voltage-gated calcium channel antibody titer (L EMS)also ordered -RT following, daily NIFs>20 -Baclofen and beta-blocker discontinued -Starting Mestinon today per neurology  -Physical therapy following, home PT recommended at discharge -MRI brain and C-spine also ordered and pending  Acute on Chronic Respiratory Failure (on 2 L chronically) 2/2 anemia, COPD,  and neuromuscular weakness as above Follow NIFS -Monitor hemoglobin  Iron Deficiency Anemia  GI Bleed  Hemoccult  Positive Presented with Hb 6.5 (11.8 07/2020) S/p 1 unit pRBC with improvement -Seen by gastroenterology in consultation ongoing anemia for 3 to 4 months, supposed to have a EGD through her local GI in [place], on account of current neurological issues gastroenterology recommended follow-up with her local GI in [place] -Give IV iron, monitor hemoglobin  CHF Exacerbation Severely elevated BNP, clinically doesn't appear significantly overloaded -Echo with EF 30-35%, moderately decreased function, global hypokinesis (prior echo with EF 25-30% - see report) Continue torsemide Strict I/O, daily weights Continue losartan  COPD/chronic respiratory failure on 2 L at home -Stable, no wheezing Continue scheduled and prn nebs NIFs as noted above  Mild Bilateral Hydroureteronephrosis 7 cc on bladder scan -Dr. Florene Glen discussed with Dr. Tresa Moore, noted mild finding on CT, likely related to bladder distension at time of study, no recommendation for any additional imaging or workup unless new symptoms or aki   Osteoporosis  Hx Fragility Fractures Upper thoracic and L rib fx noted on imaging here - osteopenia noted on imaging of cervical spine Compression deformities of T2/T3, severe at T3, subacute to chronic bilateral rib fx Recent dexa suggesting osteoporosis She's on chronic steroids notably   Right Carotid Bulb/Proximal R ICA stenosis up to 75% Follow outpatient with vascular  3 mm focal outpouching from supraclinoid L ICA Aneurysm not excluded, likely small vascular infundibulum Follow outpatient  Pyuria Follow culture (no growth)  CAD PAD - Holding home Plavix and aspirin in the setting of GI bleed as above - Continue home carvedilol and losartan  Hypertension - Continue home carvedilol and losartan - continue loop diuretics   RLS - Continue home ropinirole  Atrial fibrillation - Continue home carvedilol   Diabetes > Diet controlled - SSI  Polymyalgia  Rheumatica - she's on chronic steroids  prednisone 10 mg daily  DVT prophylaxis: SCD Code Status: DNR Family Communication: friend over phone 8/27 Disposition:   Status is: : Inpatient level of care appropriate due to severity of illness  Dispo: The patient is from: Home              Anticipated d/c is to: Home              Patient currently is not medically stable to d/c.   Difficult to place patient No  Consultants:  GI, neurology  Procedures:  Echo IMPRESSIONS     1. Left ventricular ejection fraction, by estimation, is 30 to 35%. The  left ventricle has moderately decreased function. The left ventricle  demonstrates global hypokinesis. Indeterminate diastolic filling due to  E-A fusion.   2. Right ventricular systolic function is normal. The right ventricular  size is normal. There is normal pulmonary artery systolic pressure.   3. Left atrial size was severely dilated.   4. The mitral valve is grossly normal. Trivial mitral valve  regurgitation.   5. The aortic valve was not well visualized. Aortic valve regurgitation  is not visualized. No aortic stenosis is present.   6. The inferior vena cava is normal in size with greater than 50%  respiratory variability, suggesting right atrial pressure of 3 mmHg.   Comparison(s): A prior study was performed on 06/16/2017. Prior images  reviewed side by side. Similar LV function.   Antimicrobials:  Anti-infectives (From admission, onward)    Start     Dose/Rate Route Frequency Ordered Stop   04/24/21 1530  cefTRIAXone (ROCEPHIN) 1 g in sodium chloride 0.9 % 100 mL IVPB        1 g 200 mL/hr over 30 Minutes Intravenous  Once 04/24/21 1526 04/24/21 1809   04/24/21 1530  azithromycin (ZITHROMAX) 500 mg in sodium chloride 0.9 % 250 mL IVPB        500 mg 250 mL/hr over 60 Minutes Intravenous  Once 04/24/21 1526 04/24/21 1913       Subjective: -Feels uncomfortable, having tremors,  Objective: Vitals:   04/29/21 0729  04/29/21 0731 04/29/21 0815 04/29/21 1334  BP:   (!) 162/92 (!) 182/74  Pulse:      Resp:      Temp:   98.5 F (36.9 C) 99 F (37.2 C)  TempSrc:   Oral Oral  SpO2: 100% 100%  100%  Weight:      Height:        Intake/Output Summary (Last 24 hours) at 04/29/2021 1502 Last data filed at 04/29/2021 1338 Gross per 24 hour  Intake 120 ml  Output 500 ml  Net -380 ml    Filed Weights   04/27/21 0500 04/28/21 0442 04/29/21 0533  Weight: 43.2 kg 41.1 kg 41.7 kg    Examination:  Gen: Frail thinly built female in bed, AAOx3, mild tremulousness HEENT: No JVD CVS: S1-S2, regular rate rhythm Lungs: Decreased breath sounds the bases otherwise clear Abdomen: Soft, nontender, bowel sounds present Extremities: No edema  Neurological: L ptosis Skin: Mild ecchymosis     Data Reviewed: I have personally reviewed following labs and imaging studies  CBC: Recent Labs  Lab 04/24/21 1527 04/24/21 1538 04/25/21 0345 04/26/21 1754 04/27/21 0120 04/28/21 0113 04/29/21 0218  WBC 8.3  --  8.8 9.3 12.1* 13.6* 11.9*  NEUTROABS 6.6  --   --   --  10.5* 11.7* 10.0*  HGB 6.5*   < > 7.7* 8.0* 7.6* 7.7* 7.5*  HCT 24.5*   < > 26.8* 27.9* 26.9* 26.9* 26.5*  MCV 76.8*  --  75.3* 75.8* 76.0* 75.1* 75.1*  PLT 544*  --  470* 509* 494* 485* 461*   < > = values in this interval not displayed.    Basic Metabolic Panel: Recent Labs  Lab 04/24/21 2224 04/25/21 0345 04/26/21 1754 04/27/21 0120 04/28/21 0113 04/29/21 0218  NA  --  141 137 140 138 140  K  --  3.8 3.6 3.1* 3.5 3.9  CL  --  101 97* 100 97* 98  CO2  --  31 30 33* 32 34*  GLUCOSE  --  97 390* 173* 183* 119*  BUN  --  14 22 24* 27* 36*  CREATININE  --  1.08* 1.16* 1.14* 1.27* 1.31*  CALCIUM  --  8.6* 8.3* 8.5* 8.5* 9.0  MG 1.9  --   --  1.9 2.0 2.1  PHOS  --   --   --  2.7 2.7 4.4    GFR: Estimated Creatinine Clearance: 24.1 mL/min (A) (by C-G formula based on SCr of 1.31 mg/dL (H)).  Liver Function Tests: Recent Labs  Lab  04/25/21 0345 04/26/21 1754 04/27/21 0120 04/28/21 0113 04/29/21 0218  AST 12* 14* 11* 14* 17  ALT 14 12 10 13 16   ALKPHOS 48 45 44 44 44  BILITOT 1.2 0.5 0.5 0.6 0.8  PROT 5.8* 5.5* 5.2* 5.4* 5.5*  ALBUMIN 3.2* 3.0* 2.9* 3.1* 3.2*    CBG: Recent Labs  Lab 04/28/21 1238 04/28/21 1626 04/28/21 2130 04/29/21 0809 04/29/21 1211  GLUCAP 227* 320* 251* 130* 184*     Recent Results (from the past 240 hour(s))  Culture, blood (routine x 2)     Status: None   Collection Time: 04/24/21  3:27 PM   Specimen: BLOOD RIGHT ARM  Result Value Ref Range Status   Specimen Description BLOOD RIGHT ARM  Final   Special Requests   Final    BOTTLES DRAWN AEROBIC AND ANAEROBIC Blood Culture results may not be optimal due to an inadequate volume of blood received in culture bottles   Culture   Final    NO GROWTH 5 DAYS Performed at [facility], [place] [address], [place], [place] [address]    Report Status 04/29/2021 FINAL  Final  Resp Panel by RT-PCR (Flu A&B, Covid) Nasopharyngeal Swab     Status: None   Collection Time: 04/24/21  3:39 PM   Specimen: Nasopharyngeal Swab; Nasopharyngeal(NP) swabs in vial transport medium  Result Value Ref Range Status   SARS Coronavirus 2 by RT PCR NEGATIVE NEGATIVE Final    Comment: (NOTE) SARS-CoV-2 target nucleic acids are NOT DETECTED.  The SARS-CoV-2 RNA is generally detectable in upper respiratory specimens during the acute phase of infection. The lowest concentration of SARS-CoV-2 viral copies this assay can detect is 138 copies/mL. A negative result does not preclude SARS-Cov-2 infection and should not be used as the sole basis for treatment or other patient management decisions. A negative result may occur with  improper specimen collection/handling, submission of specimen other than nasopharyngeal swab, presence of viral mutation(s) within the areas targeted by this assay, and inadequate number of viral copies(<138 copies/mL). A negative  result must be combined with clinical observations, patient history, and epidemiological information. The expected result is Negative.  Fact Sheet for Patients:  EntrepreneurPulse.com.au  Fact Sheet for Healthcare Providers:  IncredibleEmployment.be  This test is no t yet approved or cleared by the [place]  States FDA and  has been authorized for detection and/or diagnosis of SARS-CoV-2 by FDA under an Emergency Use Authorization (EUA). This EUA will remain  in effect (meaning this test can be used) for the duration of the COVID-19 declaration under Section 564(b)(1) of the Act, 21 U.S.C.section 360bbb-3(b)(1), unless the authorization is terminated  or revoked sooner.       Influenza A by PCR NEGATIVE NEGATIVE Final   Influenza B by PCR NEGATIVE NEGATIVE Final    Comment: (NOTE) The Xpert Xpress SARS-CoV-2/FLU/RSV plus assay is intended as an aid in the diagnosis of influenza from Nasopharyngeal swab specimens and should not be used as a sole basis for treatment. Nasal washings and aspirates are unacceptable for Xpert Xpress SARS-CoV-2/FLU/RSV testing.  Fact Sheet for Patients: EntrepreneurPulse.com.au  Fact Sheet for Healthcare Providers: IncredibleEmployment.be  This test is not yet approved or cleared by the [place] FDA and has been authorized for detection and/or diagnosis of SARS-CoV-2 by FDA under an Emergency Use Authorization (EUA). This EUA will remain in effect (meaning this test can be used) for the duration of the COVID-19 declaration under Section 564(b)(1) of the Act, 21 U.S.C. section 360bbb-3(b)(1), unless the authorization is terminated or revoked.  Performed at [facility], [place] [address], [place], [place] [address]   Culture, blood (routine x 2)     Status: None   Collection Time: 04/24/21 10:24 PM   Specimen: BLOOD  Result Value Ref Range Status   Specimen  Description BLOOD LEFT ANTECUBITAL  Final   Special Requests AEROBIC BOTTLE ONLY Blood Culture adequate volume  Final   Culture   Final    NO GROWTH 5 DAYS Performed at [place] [address], [place], [place] [address]    Report Status 04/29/2021 FINAL  Final  Urine Culture     Status: None   Collection Time: 04/25/21  5:02 AM   Specimen: Urine, Clean Catch  Result Value Ref Range Status   Specimen Description URINE, CLEAN CATCH  Final   Special Requests NONE  Final   Culture   Final    NO GROWTH Performed at [facility], [place] [address] [place], [place] [address]    Report Status 04/26/2021 FINAL  Final         Radiology Studies: CT CHEST ABDOMEN PELVIS W CONTRAST  Result Date: 04/27/2021 CLINICAL DATA:  Weakness. EXAM: CT CHEST, ABDOMEN, AND PELVIS WITH CONTRAST TECHNIQUE: Multidetector CT imaging of the chest, abdomen and pelvis was performed following the standard protocol during bolus administration of intravenous contrast. CONTRAST:  18mL OMNIPAQUE IOHEXOL 350 MG/ML SOLN COMPARISON:  August 05, 2020.  September 22, 2017. FINDINGS: CT CHEST FINDINGS Cardiovascular: Atherosclerosis of thoracic aorta is noted without aneurysm or dissection. Normal cardiac size. No pericardial effusion. Coronary artery calcifications are noted. Mediastinum/Nodes: No enlarged mediastinal, hilar, or axillary lymph nodes. Thyroid gland, trachea, and esophagus demonstrate no significant findings. Lungs/Pleura: No pneumothorax is noted. Emphysematous disease is noted bilaterally. Mild bibasilar subsegmental atelectasis is noted. Minimal left pleural effusion is noted. Musculoskeletal: Multiple old bilateral rib fractures are noted. Old sternal fracture is noted. Status post kyphoplasty of lower thoracic vertebral body. No acute abnormality is noted. CT ABDOMEN PELVIS FINDINGS Hepatobiliary: Stable low densities are noted in the left and right hepatic lobes most consistent with hemangiomas.  Status post cholecystectomy. No biliary dilatation is noted. Pancreas: Unremarkable. No pancreatic ductal dilatation or surrounding inflammatory changes. Spleen: Normal in size without focal abnormality. Adrenals/Urinary  Tract: Adrenal glands are unremarkable. Mild bilateral hydroureteronephrosis is noted. Moderate urinary bladder distention is noted. No obstructing calculus is noted. Stomach/Bowel: Stomach appears normal. There is no evidence of bowel obstruction or inflammation. Status post appendectomy. Large amount of stool seen throughout the colon. Vascular/Lymphatic: Aortic atherosclerosis. No enlarged abdominal or pelvic lymph nodes. Reproductive: Status post hysterectomy. No adnexal masses. Other: No abdominal wall hernia or abnormality. No abdominopelvic ascites. Musculoskeletal: No acute or significant osseous findings. IMPRESSION: Mild bilateral posterior basilar subsegmental atelectasis is noted. Small left pleural effusion is noted. Mild bilateral hydroureteronephrosis is noted without obstructing calculus, potentially due to moderate urinary bladder distention. Multiple old bilateral rib and sternal fractures are noted. Coronary artery calcifications are noted suggesting coronary artery disease. Aortic Atherosclerosis (ICD10-I70.0) and Emphysema (ICD10-J43.9). Electronically Signed   By: Marijo Conception M.D.   On: 04/27/2021 16:53        Scheduled Meds:  hydrALAZINE  25 mg Oral TID   insulin aspart  0-5 Units Subcutaneous QHS   insulin aspart  0-9 Units Subcutaneous TID WC   ipratropium-albuterol  3 mL Nebulization TID   lidocaine  1 patch Transdermal Q24H   mometasone-formoterol  2 puff Inhalation BID   pantoprazole  40 mg Oral Daily   [START ON 04/30/2021] predniSONE  10 mg Oral Q breakfast   pyridostigmine  30 mg Oral TID   sodium chloride flush  3 mL Intravenous Q12H   torsemide  20 mg Oral Daily   vitamin B-12  1,000 mcg Oral Daily   Continuous Infusions:  sodium chloride      ferric gluconate (FERRLECIT) IVPB       LOS: 4 days    Time spent: 69min    Domenic Polite, MD [facility]  04/29/2021, 3:02 PM

## 2021-04-30 ENCOUNTER — Encounter (HOSPITAL_COMMUNITY): Payer: Self-pay | Admitting: Family Medicine

## 2021-04-30 LAB — CBC
HCT: 27.5 % — ABNORMAL LOW (ref 36.0–46.0)
Hemoglobin: 7.8 g/dL — ABNORMAL LOW (ref 12.0–15.0)
MCH: 21.1 pg — ABNORMAL LOW (ref 26.0–34.0)
MCHC: 28.4 g/dL — ABNORMAL LOW (ref 30.0–36.0)
MCV: 74.5 fL — ABNORMAL LOW (ref 80.0–100.0)
Platelets: 458 10*3/uL — ABNORMAL HIGH (ref 150–400)
RBC: 3.69 MIL/uL — ABNORMAL LOW (ref 3.87–5.11)
RDW: 17.2 % — ABNORMAL HIGH (ref 11.5–15.5)
WBC: 12.3 10*3/uL — ABNORMAL HIGH (ref 4.0–10.5)
nRBC: 0 % (ref 0.0–0.2)

## 2021-04-30 LAB — ACETYLCHOLINE RECEPTOR AB, ALL
Acety choline binding ab: <0.03 nmol/L (ref 0.00–0.24)
Acetylchol Block Ab: 7 % (ref 0–25)

## 2021-04-30 LAB — BASIC METABOLIC PANEL
Anion gap: 8 (ref 5–15)
BUN: 31 mg/dL — ABNORMAL HIGH (ref 8–23)
CO2: 35 mmol/L — ABNORMAL HIGH (ref 22–32)
Calcium: 8.8 mg/dL — ABNORMAL LOW (ref 8.9–10.3)
Chloride: 95 mmol/L — ABNORMAL LOW (ref 98–111)
Creatinine, Ser: 1.25 mg/dL — ABNORMAL HIGH (ref 0.44–1.00)
GFR, Estimated: 45 mL/min — ABNORMAL LOW (ref >60)
Glucose, Bld: 169 mg/dL — ABNORMAL HIGH (ref 70–99)
Potassium: 3.4 mmol/L — ABNORMAL LOW (ref 3.5–5.1)
Sodium: 138 mmol/L (ref 135–145)

## 2021-04-30 LAB — GLUCOSE, CAPILLARY
Glucose-Capillary: 140 mg/dL — ABNORMAL HIGH (ref 70–99)
Glucose-Capillary: 183 mg/dL — ABNORMAL HIGH (ref 70–99)
Glucose-Capillary: 253 mg/dL — ABNORMAL HIGH (ref 70–99)
Glucose-Capillary: 221 mg/dL — ABNORMAL HIGH (ref 70–99)

## 2021-04-30 MED ORDER — DILTIAZEM HCL 60 MG PO TABS
60.0000 mg | ORAL_TABLET | ORAL | Status: AC
  Administered 2021-04-30: 60 mg via ORAL
  Filled 2021-04-30: qty 1

## 2021-04-30 MED ORDER — DILTIAZEM HCL 60 MG PO TABS
60.0000 mg | ORAL_TABLET | Freq: Four times a day (QID) | ORAL | Status: DC
  Filled 2021-04-30: qty 1

## 2021-04-30 MED ORDER — IPRATROPIUM-ALBUTEROL 0.5-2.5 (3) MG/3ML IN SOLN
3.0000 mL | Freq: Two times a day (BID) | RESPIRATORY_TRACT | Status: DC
  Administered 2021-04-30 – 2021-05-01 (×3): 3 mL via RESPIRATORY_TRACT
  Filled 2021-04-30 (×3): qty 3

## 2021-04-30 MED ORDER — DILTIAZEM HCL 60 MG PO TABS
60.0000 mg | ORAL_TABLET | Freq: Four times a day (QID) | ORAL | Status: DC
  Administered 2021-04-30 – 2021-05-01 (×4): 60 mg via ORAL
  Filled 2021-04-30 (×4): qty 1

## 2021-04-30 MED ORDER — DILTIAZEM HCL-DEXTROSE 125-5 MG/125ML-% IV SOLN (PREMIX)
5.0000 mg/h | INTRAVENOUS | Status: DC
Start: 2021-04-30 — End: 2021-04-30

## 2021-04-30 MED ORDER — METOPROLOL TARTRATE 5 MG/5ML IV SOLN: 10.0000 mg | Freq: Once | INTRAVENOUS | Status: DC

## 2021-04-30 MED ORDER — POTASSIUM CHLORIDE CRYS ER 20 MEQ PO TBCR
40.0000 meq | EXTENDED_RELEASE_TABLET | Freq: Once | ORAL | Status: AC
  Administered 2021-04-30: 40 meq via ORAL
  Filled 2021-04-30: qty 2

## 2021-04-30 NOTE — Progress Notes (Signed)
NIF -30 Great effort.

## 2021-04-30 NOTE — Progress Notes (Signed)
   04/30/21 1000  Assess: MEWS Score  BP (!) 157/78  Pulse Rate (!) 112  ECG Heart Rate (!) 113  SpO2 99 %  Assess: MEWS Score  MEWS Temp 0  MEWS Systolic 0  MEWS Pulse 2  MEWS RR 0  MEWS LOC 0  MEWS Score 2  MEWS Score Color Yellow  Assess: if the MEWS score is Yellow or Red  Were vital signs taken at a resting state? Yes  Focused Assessment Change from prior assessment (see assessment flowsheet)  Early Detection of Sepsis Score *See Row Information* High  MEWS guidelines implemented *See Row Information* Yes  Treat  MEWS Interventions Escalated (See documentation below)  Take Vital Signs  Increase Vital Sign Frequency  Yellow: Q 2hr X 2 then Q 4hr X 2, if remains yellow, continue Q 4hrs  Escalate  MEWS: Escalate Yellow: discuss with charge nurse/RN and consider discussing with provider and RRT  Notify: Charge Nurse/RN  Name of Charge Nurse/RN Notified Jessica, RN  Date Charge Nurse/RN Notified 04/30/21  Time Charge Nurse/RN Notified 1000  Notify: Provider  Provider Name/Title Broadus John MD  Date Provider Notified 04/30/21  Time Provider Notified 1000  Notification Type Page  Notification Reason Change in status  Provider response See new orders  Date of Provider Response 04/30/21  Time of Provider Response 1000  Document  Patient Outcome Stabilized after interventions

## 2021-04-30 NOTE — Progress Notes (Signed)
Heart Failure Nurse Navigator Progress Note  Attempted to interview patient. Currently on [facility] with therapy team. Will attempt to complete interview at a later time.   Pricilla Holm, MSN, RN Heart Failure Nurse Navigator [phone]

## 2021-04-30 NOTE — Progress Notes (Signed)
NIF -30. Great effort

## 2021-04-30 NOTE — Progress Notes (Addendum)
Occupational Therapy Treatment Patient Details Name: Kimberly Downs MRN: [id number] DOB: May 30, 1946 Today's Date: 04/30/2021    History of present illness Pt is a 75 y.o. female admitted 04/24/21 with ongoing SOB and 2-wk h/o diffuse weakness, intermittent double vision, neck weakness. Imaging notivate for osteopenia, upper thoracic fx, L rib fx. Workup for CHF/COPD exacerbation. Other symptoms favored to be secondary to neuromuscular junction disorder, most likely myasthenia gravis; workup underway. PMH includes afib, CHF, COPD, CAD, HTN, ICD, PAD, DM, RLS, depression.   OT comments  Pt progressing towards OT goals and remains motivated to participate. Pt reported need for urgent bathroom assist on entry. Pt overall Supervision for [facility] transfer without AD, min guard for clothing mgmt in unsupported standing. Pt noted with dyspnea after BSC use and reports feeling worn out after tasks like this. Collaborated on energy conservation strategies for ADLs/IADLs at home with pt already implementing many strategies. Pt endorses a new feeling of panic when SOB occurs - further reinforcement and trial of calming strategies needed in future sessions.   SpO2 98% and above on 3 L O2 HR up to 118bpm   Follow Up Recommendations  Home health OT    Equipment Recommendations  None recommended by OT    Recommendations for Other Services      Precautions / Restrictions Precautions Precautions: Fall;Other (comment) Precaution Comments: Urinary incontinence, 2 L O2 at baseline Required Braces or Orthoses: Cervical Brace Cervical Brace: Soft collar;For comfort Restrictions Weight Bearing Restrictions: No       Mobility Bed Mobility Overal bed mobility: Modified Independent Bed Mobility: Supine to Sit;Sit to Supine     Supine to sit: Modified independent (Device/Increase time);HOB elevated Sit to supine: Modified independent (Device/Increase time);HOB elevated   General bed mobility comments: HOB  elevated    Transfers Overall transfer level: Needs assistance Equipment used: Rolling walker (2 wheeled);None Transfers: Sit to/from [facility] Sit to Stand: Supervision Stand pivot transfers: Supervision       General transfer comment: Supervision for safety in pivot to/from [facility] without AD. Pt reported desire to walk in room, Supervision for standing with RW    Balance Overall balance assessment: Needs assistance;History of Falls   Sitting balance-Leahy Scale: Good       Standing balance-Leahy Scale: Fair Standing balance comment: can static stand wtihout UE support; static and dynamic stability improved with rollator                           ADL either performed or assessed with clinical judgement   ADL Overall ADL's : Needs assistance/impaired                     Lower Body Dressing: Min guard Lower Body Dressing Details (indicate cue type and reason): if in standing, setup to don slip on shoes sitting EOB Toilet Transfer: Supervision/safety;Stand-pivot;BSC [employer] forces technical officer Details (indicate cue type and reason): no use of AD Toileting- Clothing Manipulation and Hygiene: Min guard;Sit to/from stand Toileting - Clothing Manipulation Details (indicate cue type and reason): for clothing mgmt and unsupported standing while pt managed underwear     Functional mobility during ADLs: Min guard;Rolling walker General ADL Comments: Pt urgently requiring bathroom assist on entry. Session focused on energy conservation education (will provide handout next session) and calming strategies as pt reports new onset of panic when experiencing SOB. Encouraged pursed lip breathing, deep breathing, use of music, focusing mind on environment or things that  relax pt.     Vision   Vision Assessment?: Vision impaired- to be further tested in functional context Additional Comments: noted double vision secondary to myasthenia gravis. Pt reports double vision  "comes and goes" and will occur out of the blue at times. denies experiencing double vision during session, reports images usually side by side when it occurs. to be further assessed   Perception     Praxis      Cognition Arousal/Alertness: Awake/alert Behavior During Therapy: WFL for tasks assessed/performed Overall Cognitive Status: Within Functional Limits for tasks assessed                                 General Comments: WFL for basic tasks, likely close to baseline. Does endorse anxiety with SOB        Exercises     Shoulder Instructions       General Comments SpO2 98% and above on 3 L O2 (baseline 2 L O2). HR up to 118 (per RN, earlier episode of a fib with RVR). 2-3/4 DOE    Pertinent Vitals/ Pain       Pain Assessment: No/denies pain Pain Intervention(s): Monitored during session  Home Living                                          Prior Functioning/Environment              Frequency  Min 2X/week        Progress Toward Goals  OT Goals(current goals can now be found in the care plan section)  Progress towards OT goals: Progressing toward goals  Acute Rehab OT Goals Patient Stated Goal: to move to [place] with family OT Goal Formulation: With patient Time For Goal Achievement: 05/12/21 Potential to Achieve Goals: Good ADL Goals Pt Will Perform Grooming: with supervision;standing Pt Will Perform Lower Body Dressing: with supervision;sit to/from stand Pt Will Transfer to Toilet: with supervision;ambulating;bedside commode Pt Will Perform Toileting - Clothing Manipulation and hygiene: with supervision;sitting/lateral leans;sit to/from stand Additional ADL Goal #1: Pt will independently verbalize three energy conservation techniques for ADLs/ IADLs  Plan Discharge plan remains appropriate    Co-evaluation                 AM-PAC OT "6 Clicks" Daily Activity     Outcome Measure   Help from another person eating  meals?: None Help from another person taking care of personal grooming?: A Little Help from another person toileting, which includes using toliet, bedpan, or urinal?: A Little Help from another person bathing (including washing, rinsing, drying)?: A Little Help from another person to put on and taking off regular upper body clothing?: A Little Help from another person to put on and taking off regular lower body clothing?: A Little 6 Click Score: 19    End of Session Equipment Utilized During Treatment: Rolling walker;Oxygen  OT Visit Diagnosis: Unsteadiness on feet (R26.81);Other abnormalities of gait and mobility (R26.89);Muscle weakness (generalized) (M62.81)   Activity Tolerance Patient tolerated treatment well   Patient Left in bed;with call bell/phone within reach;with bed alarm set   Nurse Communication Mobility status        Time: 1400-1433 OT Time Calculation (min): 33 min  Charges: OT General Charges $OT Visit: 1 Visit OT Treatments $Self Care/Home Management : 8-22 mins $  Therapeutic Activity: 8-22 mins  Malachy Chamber, OTR/L Acute Rehab Services Office: [phone]    Layla Maw 04/30/2021, 2:57 PM

## 2021-04-30 NOTE — Care Management Important Message (Signed)
Important Message  Patient Details  Name: Kimberly Downs MRN: [id number] Date of Birth: 09/02/1945   Medicare Important Message Given:  Yes     [name] 04/30/2021, 9:25 AM

## 2021-04-30 NOTE — Progress Notes (Signed)
Sitting PROGRESS NOTE    Kimberly Downs  ZOX:[id number] DOB: [birth year] DOA: 04/24/2021 PCP: Lyman Bishop, DO   Chief Complaint  Patient presents with   Shortness of Breath   Brief Narrative:  Kimberly Downs is a 75 y.o. female with medical history significant of GI bleed, anemia, atrial fibrillation, bronchiectasis, CHF, C. difficile, COPD, CAD status post stenting, depression, hypertension, ICD placement, PAD, diabetes, restless leg syndrome who presents with ongoing shortness of breath.  She complained of neck weakness and difficulty ambulating over the past 1.5 weeks when her symptoms started.    Suspected to have myasthenia gravis.  Neurology following and guiding care Also noted to have chronic iron deficiency anemia, seen by gastroenterology this admission, recommended outpatient follow-up for this  Assessment & Plan:   Diffuse weakness, double vision, neck Weakness -friend noted she last walked 1.5 weeks ago and neck weakness started around then as well.  When head drops with neck in flexed position she has trouble breathing.  -On exam she is noted to have moderate neck extensor weakness, mild weakness Flexion and rotation, easy fatigability, double vision, left-sided ptosis -Neurology consulting, myasthenia gravis versus Lambert-Eaton myasthenic syndrome suspected -CT chest abdomen pelvis without evidence of malignancy or thymoma -Myasthenia gravis panel ordered and pending at time voltage-gated calcium channel antibody titer (L EMS)also ordered -RT following, daily NIFs>20 -Baclofen and beta-blocker discontinued -She was started on Mestinon by neurology and then discontinued yesterday on account of increased secretions -Physical therapy following, home PT recommended at discharge -MRI brain and C-spine were ordered, patient continues to decline this  Acute on Chronic Respiratory Failure (on 2 L chronically) 2/2 anemia, COPD,  and neuromuscular weakness as above Follow  NIFS -Monitor hemoglobin  Iron Deficiency Anemia  GI Bleed  Hemoccult Positive Presented with Hb 6.5 (11.8 07/2020) S/p 1 unit pRBC with improvement -Seen by gastroenterology in consultation ongoing anemia for 3 to 4 months, supposed to have a EGD through her local GI in [place], on account of current neurological issues gastroenterology recommended follow-up with her local GI in [place] -Give IV iron, monitor hemoglobin  Paroxysmal atrial fibrillation with RVR -Heart rate poorly controlled today, carvedilol was discontinued on account of suspected myasthenia, will start p.o. Cardizem today -Not appropriate for anticoagulation in setting of severe iron deficiency anemia and heme positive stools  Chronic systolic CHF Severely elevated BNP, clinically doesn't appear volume overloaded -Echo with EF 30-35%, moderately decreased function, global hypokinesis (prior echo with EF 25-30% - see report) -Clinically euvolemic, torsemide on hold, carvedilol discontinued  COPD/chronic respiratory failure on 2 L at home -Stable, no wheezing Continue scheduled and prn nebs NIFs as noted above  Right Carotid Bulb/Proximal R ICA stenosis up to 75% Follow outpatient with vascular  3 mm focal outpouching from supraclinoid L ICA Aneurysm not excluded, likely small vascular infundibulum Follow outpatient  CAD PAD - Holding home Plavix and aspirin in the setting of GI bleed as above -Carvedilol discontinued  Hypertension -Stable   RLS - Continue home ropinirole   Diabetes > Diet controlled - SSI  Polymyalgia Rheumatica - she's on chronic steroids prednisone 10 mg daily, continued   Mild Bilateral Hydroureteronephrosis 7 cc on bladder scan -Dr. Florene Glen discussed with Dr. Tresa Moore, noted mild finding on CT, likely related to bladder distension at time of study, no recommendation for any additional imaging or workup unless new symptoms or aki   Osteoporosis  Hx Fragility  Fractures Upper thoracic and L rib fx noted on imaging  here - osteopenia noted on imaging of cervical spine Compression deformities of T2/T3, severe at T3, subacute to chronic bilateral rib fx Recent dexa suggesting osteoporosis She's on chronic steroids notably   DVT prophylaxis: SCDs Code Status: DNR Family Communication: friend over phone 8/27 Disposition:   Status is: : Inpatient level of care appropriate due to severity of illness  Dispo: The patient is from: Home              Anticipated d/c is to: Home              Patient currently is not medically stable to d/c.   Difficult to place patient No  Consultants:  GI, neurology  Procedures:  Echo IMPRESSIONS     1. Left ventricular ejection fraction, by estimation, is 30 to 35%. The  left ventricle has moderately decreased function. The left ventricle  demonstrates global hypokinesis. Indeterminate diastolic filling due to  E-A fusion.   2. Right ventricular systolic function is normal. The right ventricular  size is normal. There is normal pulmonary artery systolic pressure.   3. Left atrial size was severely dilated.   4. The mitral valve is grossly normal. Trivial mitral valve  regurgitation.   5. The aortic valve was not well visualized. Aortic valve regurgitation  is not visualized. No aortic stenosis is present.   6. The inferior vena cava is normal in size with greater than 50%  respiratory variability, suggesting right atrial pressure of 3 mmHg.   Comparison(s): A prior study was performed on 06/16/2017. Prior images  reviewed side by side. Similar LV function.   Antimicrobials:  Anti-infectives (From admission, onward)    Start     Dose/Rate Route Frequency Ordered Stop   04/24/21 1530  cefTRIAXone (ROCEPHIN) 1 g in sodium chloride 0.9 % 100 mL IVPB        1 g 200 mL/hr over 30 Minutes Intravenous  Once 04/24/21 1526 04/24/21 1809   04/24/21 1530  azithromycin (ZITHROMAX) 500 mg in sodium chloride 0.9 %  250 mL IVPB        500 mg 250 mL/hr over 60 Minutes Intravenous  Once 04/24/21 1526 04/24/21 1913       Subjective: -Walked with physical therapy today, breathing is at baseline,  Objective: Vitals:   04/30/21 1026 04/30/21 1109 04/30/21 1123 04/30/21 1214  BP: (!) 150/87 (!) 145/83 123/66 (!) 148/71  Pulse: (!) 110 (!) 103 (!) 101 93  Resp:      Temp:    98.3 F (36.8 C)  TempSrc:    Oral  SpO2: 99% 98% 97% 100%  Weight:      Height:        Intake/Output Summary (Last 24 hours) at 04/30/2021 1224 Last data filed at 04/30/2021 0600 Gross per 24 hour  Intake 480 ml  Output 1000 ml  Net -520 ml    Filed Weights   04/28/21 0442 04/29/21 0533 04/30/21 0502  Weight: 41.1 kg 41.7 kg 41.1 kg    Examination:  Gen: Frail thinly built female sitting up in bed, AAOx3 HEENT: No JVD CVS: S1-S2, irregularly irregular rhythm Lungs: Decreased breath sounds both bases Abdomen: Soft, nontender, bowel sounds present Extremities: No edema  Neurological: L ptosis Skin: Mild ecchymosis     Data Reviewed: I have personally reviewed following labs and imaging studies  CBC: Recent Labs  Lab 04/24/21 1527 04/24/21 1538 04/26/21 1754 04/27/21 0120 04/28/21 0113 04/29/21 0218 04/30/21 0305  WBC 8.3   < >  9.3 12.1* 13.6* 11.9* 12.3*  NEUTROABS 6.6  --   --  10.5* 11.7* 10.0*  --   HGB 6.5*   < > 8.0* 7.6* 7.7* 7.5* 7.8*  HCT 24.5*   < > 27.9* 26.9* 26.9* 26.5* 27.5*  MCV 76.8*   < > 75.8* 76.0* 75.1* 75.1* 74.5*  PLT 544*   < > 509* 494* 485* 461* 458*   < > = values in this interval not displayed.    Basic Metabolic Panel: Recent Labs  Lab 04/24/21 2224 04/25/21 0345 04/26/21 1754 04/27/21 0120 04/28/21 0113 04/29/21 0218 04/30/21 0305  NA  --    < > 137 140 138 140 138  K  --    < > 3.6 3.1* 3.5 3.9 3.4*  CL  --    < > 97* 100 97* 98 95*  CO2  --    < > 30 33* 32 34* 35*  GLUCOSE  --    < > 390* 173* 183* 119* 169*  BUN  --    < > 22 24* 27* 36* 31*   CREATININE  --    < > 1.16* 1.14* 1.27* 1.31* 1.25*  CALCIUM  --    < > 8.3* 8.5* 8.5* 9.0 8.8*  MG 1.9  --   --  1.9 2.0 2.1  --   PHOS  --   --   --  2.7 2.7 4.4  --    < > = values in this interval not displayed.    GFR: Estimated Creatinine Clearance: 24.8 mL/min (A) (by C-G formula based on SCr of 1.25 mg/dL (H)).  Liver Function Tests: Recent Labs  Lab 04/25/21 0345 04/26/21 1754 04/27/21 0120 04/28/21 0113 04/29/21 0218  AST 12* 14* 11* 14* 17  ALT 14 12 10 13 16   ALKPHOS 48 45 44 44 44  BILITOT 1.2 0.5 0.5 0.6 0.8  PROT 5.8* 5.5* 5.2* 5.4* 5.5*  ALBUMIN 3.2* 3.0* 2.9* 3.1* 3.2*    CBG: Recent Labs  Lab 04/29/21 1211 04/29/21 1632 04/29/21 2204 04/30/21 0818 04/30/21 1117  GLUCAP 184* 322* 182* 140* 183*     Recent Results (from the past 240 hour(s))  Culture, blood (routine x 2)     Status: None   Collection Time: 04/24/21  3:27 PM   Specimen: BLOOD RIGHT ARM  Result Value [place] Status   Specimen Description BLOOD RIGHT ARM  Final   Special Requests   Final    BOTTLES DRAWN AEROBIC AND ANAEROBIC Blood Culture results may not be optimal due to an inadequate volume of blood received in culture bottles   Culture   Final    NO GROWTH 5 DAYS Performed at [facility], [place] [address], [place], [place] [address]    Report Status 04/29/2021 FINAL  Final  Resp Panel by RT-PCR (Flu A&B, Covid) Nasopharyngeal Swab     Status: None   Collection Time: 04/24/21  3:39 PM   Specimen: Nasopharyngeal Swab; Nasopharyngeal(NP) swabs in vial transport medium  Result Value [place] Status   SARS Coronavirus 2 by RT PCR NEGATIVE NEGATIVE Final    Comment: (NOTE) SARS-CoV-2 target nucleic acids are NOT DETECTED.  The SARS-CoV-2 RNA is generally detectable in upper respiratory specimens during the acute phase of infection. The lowest concentration of SARS-CoV-2 viral copies this assay can detect is 138 copies/mL. A negative result does not preclude  SARS-Cov-2 infection and should not be used as the sole basis for treatment or  other patient management decisions. A negative result may occur with  improper specimen collection/handling, submission of specimen other than nasopharyngeal swab, presence of viral mutation(s) within the areas targeted by this assay, and inadequate number of viral copies(<138 copies/mL). A negative result must be combined with clinical observations, patient history, and epidemiological information. The expected result is Negative.  Fact Sheet for Patients:  EntrepreneurPulse.com.au  Fact Sheet for Healthcare Providers:  IncredibleEmployment.be  This test is no t yet approved or cleared by the [place] FDA and  has been authorized for detection and/or diagnosis of SARS-CoV-2 by FDA under an Emergency Use Authorization (EUA). This EUA will remain  in effect (meaning this test can be used) for the duration of the COVID-19 declaration under Section 564(b)(1) of the Act, 21 U.S.C.section 360bbb-3(b)(1), unless the authorization is terminated  or revoked sooner.       Influenza A by PCR NEGATIVE NEGATIVE Final   Influenza B by PCR NEGATIVE NEGATIVE Final    Comment: (NOTE) The Xpert Xpress SARS-CoV-2/FLU/RSV plus assay is intended as an aid in the diagnosis of influenza from Nasopharyngeal swab specimens and should not be used as a sole basis for treatment. Nasal washings and aspirates are unacceptable for Xpert Xpress SARS-CoV-2/FLU/RSV testing.  Fact Sheet for Patients: EntrepreneurPulse.com.au  Fact Sheet for Healthcare Providers: IncredibleEmployment.be  This test is not yet approved or cleared by the [place] FDA and has been authorized for detection and/or diagnosis of SARS-CoV-2 by FDA under an Emergency Use Authorization (EUA). This EUA will remain in effect (meaning this test can be used) for the duration of  the COVID-19 declaration under Section 564(b)(1) of the Act, 21 U.S.C. section 360bbb-3(b)(1), unless the authorization is terminated or revoked.  Performed at [facility], [place] [address] [place], [place] [address]   Culture, blood (routine x 2)     Status: None   Collection Time: 04/24/21 10:24 PM   Specimen: BLOOD  Result Value [place] Status   Specimen Description BLOOD LEFT ANTECUBITAL  Final   Special Requests AEROBIC BOTTLE ONLY Blood Culture adequate volume  Final   Culture   Final    NO GROWTH 5 DAYS Performed at [place] [address] [place], [place] [address]    Report Status 04/29/2021 FINAL  Final  Urine Culture     Status: None   Collection Time: 04/25/21  5:02 AM   Specimen: Urine, Clean Catch  Result Value [place] Status   Specimen Description URINE, CLEAN CATCH  Final   Special Requests NONE  Final   Culture   Final    NO GROWTH Performed at [facility], [place] [address] [place], [place] [address]    Report Status 04/26/2021 FINAL  Final     Scheduled Meds:  diltiazem  60 mg Oral Q6H   hydrALAZINE  25 mg Oral TID   insulin aspart  0-5 Units Subcutaneous QHS   insulin aspart  0-9 Units Subcutaneous TID WC   ipratropium-albuterol  3 mL Nebulization BID   lidocaine  1 patch Transdermal Q24H   mometasone-formoterol  2 puff Inhalation BID   pantoprazole  40 mg Oral Daily   potassium chloride  40 mEq Oral Once   predniSONE  10 mg Oral Q breakfast   sodium chloride flush  3 mL Intravenous Q12H   vitamin B-12  1,000 mcg Oral Daily   Continuous Infusions:  sodium chloride     ferric gluconate (FERRLECIT) IVPB  LOS: 5 days    Time spent: 76min    Domenic Polite, MD [facility]  04/30/2021, 12:24 PM

## 2021-04-30 NOTE — Progress Notes (Signed)
Neurology Progress Note  Patient ID: 75 year old female with a 2 week history of diffuse weakness with intermittent double vision and acute attacks of neck weakness favored to be 2/2 neuromuscular junction disorder, most likely myasthenia gravis.  Subjective: Weakness unchanged on exam, VSS, NIF -30 this AM. No new neurologic complaints today. Refusing MRI x4 days (agrees then refuses again). Antibody panels for NMJ disorders remain pending.  Objective:  Vitals:   04/30/21 1026 04/30/21 1109  BP: (!) 150/87 (!) 145/83  Pulse: (!) 110 (!) 103  Resp:    Temp:    SpO2: 99% 98%    Physical Exam  HEENT-  [place]/AT   Lungs- Respirations unlabored. No tachypnea. No visible use of accessory muscles of respiration.  Abdomen- Nondistended Extremities- Warm and well perfused. Thinning skin with multiple old subcutaneous hemorrhagic lesions.      Neurological Examination Mental Status: Awake, alert and oriented. Speech fluent without evidence of aphasia.  Able to follow all commands without difficulty. Pleasant and cooperative with good recall for recent events.  Cranial Nerves: II: Temporal visual fields intact without extinction to DSS. PERRL.   III,IV, VI: Mild ptosis at baseline. When asked to look upwards for > 30 seconds, the patient's R eye drifts downward by about 2 mm, she experiences double vision. Prior to the above, eyes were conjugate and she was able to gaze to the far right and left as well as up and down without difficulty. No nystagmus.  V,VII: Smile symmetric without weakness. Facial temp sensation subjectively decreased bilaterally.  VIII: Hearing intact to conversation.  IX,X: No pharyngeal dysarthria or hypophonia XI: Neck flexion 4/5, neck rotation 4+/5 to right and left. Neck extension 4-/5. Shoulder shrug is symmetric.  XII: Midline tongue extension. No atrophy or fasciculations noted.  Motor: BUE 4+/5 proximally and distally in the context of diffusely decreased muscle  bulk.  No pronator drift . BLE 4/5 proximally and distally in the context of diffusely decreased muscle bulk. Fatiguable weakness on repeated muscle testing L hip flexor. Sensory: Subjectively decreased temp sensation to BUE and BLE. FT intact x 4. No extinction to DSS.  Deep Tendon Reflexes: 1-2+ and symmetric throughout Cerebellar: No ataxia with FNF bilaterally Gait: Deferred  Data: no e/o malignancy on CT c/a/p w contrast 8/29  A/P: Assessment: 75 year old female with a 2 week history of diffuse weakness with intermittent double vision and acute attacks of neck weakness.  1. Exam reveals moderate weakness of neck extensors, mild weakness of neck flexion and head rotation to left and right. Also with fatiguability with sustained upgaze resulting in double vision, weakness of left eye supraduction and left sided ptosis. Limb strength is slightly decreased in the context of her diffusely decreased muscle bulk.  2. Most likely etiology for her weakness, given her history, symptoms and exam findings, is an acquired disorder of the neuromuscular junction, such as myasthenia gravis or Lambert-Eaton myasthenic syndrome (aka LEMS, which is a concern given her 20 lb weight loss over the past 4 months).  3. Multiple rib fractures on imaging. May have contributed to the acute worsening of her progressive SOB at home.   4. CT c/a/p with contrast showed no e/o malignancy or thymoma   Recommendations: - Myasthenia gravis panel has been ordered. Added more comprehensive send out panel [facility] test code [id number] Acetylcholine Receptor (AChR) Antibodies, Complete Profile with Reflex to MuSK Antibodies - Anti-voltage-gated calcium channel antibody titer (LEMS) has been ordered. - RT to assess NIF and  FVC q 12 hrs - Baclofen and beta-blocker discontinued - Mestinon d/c'd 2/2 increased secretions - MRI c spine on hold 2/2 patient refusal - Outpatient Vascular Surgery follow up regarding CTA finding of extensive  atheromatous change about the right carotid bulb/proximal right ICA with associated stenosis of up to 75% by NASCET criteria. This finding is not felt to be related to her current presentation.  - Further recommendations pending the results of the above tests.  - Will need outpatient Neurology follow up. At that time may benefit from EMG/NCS of the neck musculature.   Su Monks, MD [facility] [phone]  If 7pm- 7am, please page neurology on call as listed in [place].

## 2021-04-30 NOTE — Progress Notes (Signed)
Physical Therapy Treatment Patient Details Name: Kimberly Downs MRN: [id number] DOB: 08-Jul-1946 Today's Date: 04/30/2021    History of Present Illness Pt is a 75 y.o. female admitted 04/24/21 with ongoing SOB and 2-wk h/o diffuse weakness, intermittent double vision, neck weakness. Imaging notivate for osteopenia, upper thoracic fx, L rib fx. Workup for CHF/COPD exacerbation. Other symptoms favored to be secondary to neuromuscular junction disorder, most likely myasthenia gravis; workup underway. PMH includes afib, CHF, COPD, CAD, HTN, ICD, PAD, DM, RLS, depression.   PT Comments    Pt progressing with mobility. Today's session focused on transfer and gait training with rollator, intermittent min guard for balance; pt requires seated rest break after brief bouts of activity secondary to multiple complaints, including dizziness; pt states, "It feels like my brain is not communicating well with the rest of my body." Pt remains limited by generalized weakness, decreased activity tolerance, and impaired balance strategies/postural reactions. Initiated educ re: activity recommendations, energy conservation strategies. Continue to recommend follow-up with HHPT to maximize functional mobility and independence upon return home.   Orthostatic BPs Post-ambulation to bathroom 147/98  Standing 143/80, HR 117  Standing after 2-min 115/81, HR 121  Sitting 3-min 138/86, HR 104  Post-ambulation  134/85     Follow Up Recommendations  Home health PT;Supervision for mobility/OOB     Equipment Recommendations  None recommended by PT    Recommendations for Other Services       Precautions / Restrictions Precautions Precautions: Fall;Other (comment) Required Braces or Orthoses: Cervical Brace Cervical Brace: Soft collar;For comfort Restrictions Weight Bearing Restrictions: No    Mobility  Bed Mobility Overal bed mobility: Independent                  Transfers Overall transfer level:  Needs assistance Equipment used: 4-wheeled walker Transfers: Sit to/from Stand Sit to Stand: Supervision         General transfer comment: Intermittent verbal cues to lock rollator brakes prior to stand/sit; multiple sit<>stands from recliner to rollator, supervision for safety due to pt feeling unsteady and c/o dizziness  Ambulation/Gait Ambulation/Gait assistance: Min guard;Supervision Gait Distance (Feet): 54 Feet (+ 68') Assistive device: 4-wheeled walker Gait Pattern/deviations: Step-through pattern;Decreased stride length;Shuffle;Trunk flexed Gait velocity: Decreased   General Gait Details: Slow, mostly steady gait with rollator and intermittent min guard for balance; pt holding shoulders up to ears requiring verbal cues to relax/shoulders arms; pt with bout of BLE shaking ("they feel like noodles") requiring seated rest break, then able to ambulate additional distance with no leg shaking noted. Pt reports "it's like my brain is not communicating well with the rest of my body"   Stairs             Wheelchair Mobility    Modified Rankin (Stroke Patients Only)       Balance Overall balance assessment: Needs assistance;History of Falls   Sitting balance-Leahy Scale: Good       Standing balance-Leahy Scale: Fair Standing balance comment: can static stand wtihout UE support; static and dynamic stability improved with rollator                            Cognition Arousal/Alertness: Awake/alert Behavior During Therapy: WFL for tasks assessed/performed Overall Cognitive Status: Within Functional Limits for tasks assessed  General Comments: WFL for simple tasks; suspect some short-term memory deficits and decreased attention; likely near baseline cognition      Exercises      General Comments General comments (skin integrity, edema, etc.): Pt with difficulty describing symptoms of dizziness, at times  reporting "it feels like I'm going to pass out" other times saying, "it's like my brain is not communicating well with the rest of my body." Noted drop in BP from standing (BP 143/80) to standing ~2 min (BP 115/81), otherwise no drop in BP with mobility during session. HR 100s-120s, SpO2 98% on 2L O2      Pertinent Vitals/Pain Pain Assessment: Faces Faces Pain Scale: Hurts a little bit Pain Location: Generalized Pain Descriptors / Indicators: Tiring;Discomfort Pain Intervention(s): Monitored during session    Home Living                      Prior Function            PT Goals (current goals can now be found in the care plan section) Progress towards PT goals: Progressing toward goals    Frequency    Min 3X/week      PT Plan Current plan remains appropriate    Co-evaluation              AM-PAC PT "6 Clicks" Mobility   Outcome Measure  Help needed turning from your back to your side while in a flat bed without using bedrails?: None Help needed moving from lying on your back to sitting on the side of a flat bed without using bedrails?: None Help needed moving to and from a bed to a chair (including a wheelchair)?: A Little Help needed standing up from a chair using your arms (e.g., wheelchair or bedside chair)?: A Little Help needed to walk in hospital room?: A Little Help needed climbing 3-5 steps with a railing? : A Little 6 Click Score: 20    End of Session Equipment Utilized During Treatment: Gait belt;Oxygen Activity Tolerance: Patient tolerated treatment well Patient left: in chair;with call bell/phone within reach;with chair alarm set Nurse Communication: Mobility status PT Visit Diagnosis: Other abnormalities of gait and mobility (R26.89);History of falling (Z91.81);Muscle weakness (generalized) (M62.81)     Time: 8416-6063 PT Time Calculation (min) (ACUTE ONLY): 27 min  Charges:  $Gait Training: 8-22 mins $Therapeutic Activity: 8-22  mins                     Mabeline Caras, PT, DPT Acute Rehabilitation Services  Pager [phone] Office [place] 04/30/2021, 9:52 AM

## 2021-05-01 ENCOUNTER — Inpatient Hospital Stay (HOSPITAL_COMMUNITY): Payer: Medicare Other

## 2021-05-01 LAB — CBC
HCT: 26.5 % — ABNORMAL LOW (ref 36.0–46.0)
Hemoglobin: 7.5 g/dL — ABNORMAL LOW (ref 12.0–15.0)
MCH: 21.2 pg — ABNORMAL LOW (ref 26.0–34.0)
MCHC: 28.3 g/dL — ABNORMAL LOW (ref 30.0–36.0)
MCV: 74.9 fL — ABNORMAL LOW (ref 80.0–100.0)
Platelets: 411 10*3/uL — ABNORMAL HIGH (ref 150–400)
RBC: 3.54 MIL/uL — ABNORMAL LOW (ref 3.87–5.11)
RDW: 17.5 % — ABNORMAL HIGH (ref 11.5–15.5)
WBC: 12 10*3/uL — ABNORMAL HIGH (ref 4.0–10.5)
nRBC: 0 % (ref 0.0–0.2)

## 2021-05-01 LAB — MISC LABCORP TEST (SEND OUT): Labcorp test code: 140640

## 2021-05-01 LAB — BASIC METABOLIC PANEL
Anion gap: 9 (ref 5–15)
BUN: 27 mg/dL — ABNORMAL HIGH (ref 8–23)
CO2: 30 mmol/L (ref 22–32)
Calcium: 9.2 mg/dL (ref 8.9–10.3)
Chloride: 97 mmol/L — ABNORMAL LOW (ref 98–111)
Creatinine, Ser: 1.28 mg/dL — ABNORMAL HIGH (ref 0.44–1.00)
GFR, Estimated: 43 mL/min — ABNORMAL LOW (ref >60)
Glucose, Bld: 130 mg/dL — ABNORMAL HIGH (ref 70–99)
Potassium: 3.8 mmol/L (ref 3.5–5.1)
Sodium: 136 mmol/L (ref 135–145)

## 2021-05-01 LAB — GLUCOSE, CAPILLARY
Glucose-Capillary: 149 mg/dL — ABNORMAL HIGH (ref 70–99)
Glucose-Capillary: 180 mg/dL — ABNORMAL HIGH (ref 70–99)
Glucose-Capillary: 146 mg/dL — ABNORMAL HIGH (ref 70–99)
Glucose-Capillary: 214 mg/dL — ABNORMAL HIGH (ref 70–99)

## 2021-05-01 MED ORDER — DILTIAZEM HCL ER COATED BEADS 240 MG PO CP24
240.0000 mg | ORAL_CAPSULE | Freq: Every day | ORAL | Status: DC
  Administered 2021-05-01 – 2021-05-05 (×5): 240 mg via ORAL
  Filled 2021-05-01 (×5): qty 1

## 2021-05-01 MED ORDER — COVID-19 MRNA VACC (MODERNA) 100 MCG/0.5ML IM SUSP
0.5000 mL | Freq: Once | INTRAMUSCULAR | Status: DC
  Filled 2021-05-01: qty 0.5

## 2021-05-01 NOTE — Progress Notes (Signed)
NIF of -30 performed with good effort.  No complications.

## 2021-05-01 NOTE — TOC Initial Note (Signed)
Transition of Care [facility]) - Initial/Assessment Note    Patient Details  Name: Kimberly Downs MRN: [id number] Date of Birth: 1945/11/11  Transition of Care [facility]) CM/SW Contact:    Bethena Roys, RN Phone Number: 05/01/2021, 12:00 PM  Clinical Narrative:  Risk for readmission assessment completed. Prior to arrival patient states she was staying with a friend [name] at her apartment [address] [place] [address]. Patient states prior to hospitalization that she had transportation to appointments and the pharmacy. Patient is unsure if she will return to her friends apartment vs to [place] with family support. Case Manager discussed home health services and patient asked Case Manager to hold off on securing an agency at this time. Case Manager will continue to follow for transition of care needs.                 Expected Discharge Plan: [place] Barriers to Discharge: Continued Medical Work up   Patient Goals and CMS Choice Patient states their goals for this hospitalization and ongoing recovery are:: Patient is not sure if she will stay in [place] vs go to [place] with family.      Expected Discharge Plan and Services Expected Discharge Plan: [place] In-house Referral: NA Discharge Planning Services: CM Consult Post Acute Care Choice: Home Health Living arrangements for the past 2 months: Apartment                   DME Agency: NA     Prior Living Arrangements/Services Living arrangements for the past 2 months: Apartment Lives with:: Self, Friends (Pt is currently not living in her apartment; she is staying with a friend.) Patient language and need for interpreter reviewed:: Yes Do you feel safe going back to the place where you live?: Yes      Need for Family Participation in Patient Care: Yes (Comment) Care giver support system in place?: Yes (comment)   Criminal Activity/Legal Involvement Pertinent to Current  Situation/Hospitalization: No - Comment as needed  Activities of Daily Living Home Assistive Devices/Equipment: Walker (specify type), Cane (specify quad or straight), Blood pressure cuff, Electric scooter, Nebulizer, Oxygen, Shower chair with back ADL Screening (condition at time of admission) Patient's cognitive ability adequate to safely complete daily activities?: Yes Is the patient deaf or have difficulty hearing?: No Does the patient have difficulty seeing, even when wearing glasses/contacts?: No Does the patient have difficulty concentrating, remembering, or making decisions?: No Patient able to express need for assistance with ADLs?: Yes Does the patient have difficulty dressing or bathing?: No Independently performs ADLs?: Yes (appropriate for developmental age) Does the patient have difficulty walking or climbing stairs?: Yes Weakness of Legs: Both Weakness of Arms/Hands: Both  Permission Sought/Granted Permission sought to share information with : Family Supports, Chartered certified accountant granted to share information with : Yes, Verbal Permission Granted    Emotional Assessment Appearance:: Appears stated age Attitude/Demeanor/Rapport: Engaged Affect (typically observed): Appropriate Orientation: : Oriented to  Time, Oriented to Place, Oriented to Self, Oriented to Situation Alcohol / Substance Use: Not Applicable Psych Involvement: No (comment)  Admission diagnosis:  Acute on chronic respiratory failure with hypoxia (HCC) [J96.21] Anemia, unspecified type [D64.9] Dyspnea, unspecified type [R06.00] Symptomatic anemia [D64.9] Patient Active Problem List   Diagnosis Date Noted   Myasthenia gravis ([place])    Symptomatic anemia 04/25/2021   Acute on chronic respiratory failure with hypoxia ([place]) 04/24/2021   Bronchiectasis without complication ([place])  01/26/2019   Medication management 11/09/2018   Type 2 diabetes, diet controlled ([place]) 08/06/2018   PAD  (peripheral artery disease) ([place]) 08/06/2018   Acute sinusitis 03/14/2018   Moderate protein-calorie malnutrition ([place]) 11/16/2017   GOLD COPD II D 11/16/2017   Malnutrition of moderate degree 09/24/2017   Acute GI bleeding 09/22/2017   Acute confusional state due to accidental opioid overdose 07/30/2017   Restless leg syndrome 07/30/2017   Coronary artery disease involving native coronary artery of native heart without angina pectoris 07/28/2017   Chronic anemia 07/28/2017   At risk for sudden cardiac death 2017/08/14   ICD (implantable cardioverter-defibrillator) in place 08-14-17   Post concussion syndrome 07/18/2017   NICM (nonischemic cardiomyopathy) ([place]) 06/22/2017   Paroxysmal atrial fibrillation ([place]) 03/20/2017   Chronic combined systolic and diastolic heart failure ([place]) 03/15/2017   Dizziness 02/09/2017   Depression 02/09/2017   Gastrointestinal hemorrhage    Acute respiratory failure with hypoxia ([place]) 01/27/2017   Sepsis ([place]) 01/27/2017   Community acquired pneumonia 01/27/2017   Atrial fibrillation with RVR ([place]) 01/27/2017   Drug-seeking behavior 06/23/2016   Hypercholesterolemia    History of coronary artery stent placement    Pain in the chest 04/03/2016   Unstable angina ([place]) 04/03/2016   COPD exacerbation ([place]) 04/03/2016   Subdural hematoma ([place]) 12/07/2015   Liver lesion 06/10/2015   Clostridium difficile diarrhea 05/25/2015   Dehydration 05/25/2015   Acute kidney injury ([place]) 05/25/2015   Anemia associated with acute blood loss - GI Bleed 05/25/2015   DM (diabetes mellitus), type 2 with renal complications ([place]) 50/56/9794   Essential hypertension 05/25/2015   Coronary artery disease involving native coronary artery of native heart with angina pectoris ([place]) 05/25/2015   Tobacco abuse 05/25/2015   Recurrent colitis due to Clostridium difficile 05/25/2015   PCP:  Lyman Bishop, DO Pharmacy:   [facility], [address] [place] [address] Phone: [phone] Fax: [phone]  [facility]  ([place], [place] [facility] [address] [place] [address] Phone: [phone] Fax: [phone]   Social Determinants of Health (SDOH) Interventions Food Insecurity Interventions: Intervention Not Indicated Financial Strain Interventions: Intervention Not Indicated Housing Interventions: Intervention Not Indicated Transportation Interventions: Patient Refused  Readmission Risk Interventions Readmission Risk Prevention Plan 05/01/2021  Transportation Screening Complete  PCP or Specialist Appt within 3-5 Days Complete  HRI or Home Care Consult Complete  Social Work Consult for [place] Planning/Counseling Complete  Palliative Care Screening Not Applicable  Medication Review Press photographer) Complete  Some recent data might be hidden

## 2021-05-01 NOTE — Progress Notes (Signed)
Patient brought down from [place] with SWOT RN. Patient has [place]. [facility] express sent to Ivan-Rep and Renee-Cardiology PA. Orders received for OVO

## 2021-05-01 NOTE — Progress Notes (Signed)
Sitting PROGRESS NOTE    GIRL SCHISSLER  URK:[id number] DOB: 08/29/1946 DOA: 04/24/2021 PCP: Lyman Bishop, DO   Chief Complaint  Patient presents with   Shortness of Breath   Brief Narrative:  RIELY OETKEN is a 75 y.o. female with medical history significant of GI bleed, anemia, atrial fibrillation, bronchiectasis, CHF, C. difficile, COPD, CAD status post stenting, depression, hypertension, ICD placement, PAD, diabetes, restless leg syndrome who presents with ongoing shortness of breath.  She complained of neck weakness and difficulty ambulating over the past 1.5 weeks when her symptoms started.    Suspected to have myasthenia gravis.  Neurology following and guiding care Also noted to have chronic iron deficiency anemia, seen by gastroenterology this admission, recommended outpatient follow-up for this  Assessment & Plan:   Diffuse weakness, double vision, neck Weakness -friend noted she last walked 1.5 weeks ago and neck weakness started around then as well.  When head drops with neck in flexed position she has trouble breathing.  -On exam she is noted to have moderate neck extensor weakness, mild weakness Flexion and rotation, easy fatigability, double vision, left-sided ptosis -Neurology consulting, myasthenia gravis versus Lambert-Eaton myasthenic syndrome suspected -CT chest abdomen pelvis without evidence of malignancy or thymoma -Myasthenia gravis panel, voltage-gated calcium channel antibody titer (L EMS)-pending -RT following, daily NIFs>20 -Baclofen and beta-blocker discontinued -She was started on Mestinon by neurology and then discontinued 8/31 on account of increased secretions -workup/management per Neurology -Physical therapy following, home PT recommended at discharge -MRI brain and C-spine still pending, finally agrees to this  Acute on Chronic Respiratory Failure (on 2 L chronically) 2/2 anemia, COPD,  and neuromuscular weakness as above Follow  NIFS -Monitor hemoglobin  Iron Deficiency Anemia  GI Bleed  Hemoccult Positive Presented with Hb 6.5 (11.8 07/2020) S/p 1 unit pRBC with improvement -Seen by gastroenterology in consultation ongoing anemia for 3 to 4 months, supposed to have a EGD through her local GI in [place], on account of current neurological issues gastroenterology recommended follow-up with her local GI in [place] -Give IV iron, monitor hemoglobin  Paroxysmal atrial fibrillation with RVR -Heart rate poorly controlled today, carvedilol was discontinued on account of suspected myasthenia, now on p.o. Cardizem instead -Not appropriate for anticoagulation in setting of severe iron deficiency anemia and heme positive stools  Chronic systolic CHF Severely elevated BNP, clinically doesn't appear volume overloaded -Echo with EF 30-35%, moderately decreased function, global hypokinesis (prior echo with EF 25-30% - see report) -Clinically euvolemic, torsemide on hold, carvedilol discontinued by neurology on account of suspected myasthenia  COPD/chronic respiratory failure on 2 L at home -Stable, no wheezing Continue scheduled and prn nebs NIFs as noted above  Right Carotid Bulb/Proximal R ICA stenosis up to 75% Follow outpatient with vascular  3 mm focal outpouching from supraclinoid L ICA Aneurysm not excluded, likely small vascular infundibulum Follow outpatient  CAD PAD - Holding home Plavix and aspirin in the setting of GI bleed as above -Carvedilol discontinued  Hypertension -Stable   RLS - Continue home ropinirole   Diabetes > Diet controlled - SSI  Polymyalgia Rheumatica - she's on chronic steroids prednisone 10 mg daily, continued  Mild Bilateral Hydroureteronephrosis 7 cc on bladder scan -Dr. Florene Glen discussed with Dr. Tresa Moore, noted mild finding on CT, likely related to bladder distension at time of study, no recommendation for any additional imaging or workup unless new symptoms or  aki   Osteoporosis  Hx Fragility Fractures Upper thoracic and L rib fx noted  on imaging here - osteopenia noted on imaging of cervical spine Compression deformities of T2/T3, severe at T3, subacute to chronic bilateral rib fx Recent dexa suggesting osteoporosis She's on chronic steroids notably   DVT prophylaxis: SCDs Code Status: DNR Family Communication: friend over phone 8/27 Disposition:   Status is: : Inpatient level of care appropriate due to severity of illness  Dispo: The patient is from: Home              Anticipated d/c is to: Home              Patient currently is not medically stable to d/c.   Difficult to place patient No  Consultants:  GI, neurology  Procedures:  Echo IMPRESSIONS     1. Left ventricular ejection fraction, by estimation, is 30 to 35%. The  left ventricle has moderately decreased function. The left ventricle  demonstrates global hypokinesis. Indeterminate diastolic filling due to  E-A fusion.   2. Right ventricular systolic function is normal. The right ventricular  size is normal. There is normal pulmonary artery systolic pressure.   3. Left atrial size was severely dilated.   4. The mitral valve is grossly normal. Trivial mitral valve  regurgitation.   5. The aortic valve was not well visualized. Aortic valve regurgitation  is not visualized. No aortic stenosis is present.   6. The inferior vena cava is normal in size with greater than 50%  respiratory variability, suggesting right atrial pressure of 3 mmHg.   Comparison(s): A prior study was performed on 06/16/2017. Prior images  reviewed side by side. Similar LV function.   Antimicrobials:  Anti-infectives (From admission, onward)    Start     Dose/Rate Route Frequency Ordered Stop   04/24/21 1530  cefTRIAXone (ROCEPHIN) 1 g in sodium chloride 0.9 % 100 mL IVPB        1 g 200 mL/hr over 30 Minutes Intravenous  Once 04/24/21 1526 04/24/21 1809   04/24/21 1530  azithromycin  (ZITHROMAX) 500 mg in sodium chloride 0.9 % 250 mL IVPB        500 mg 250 mL/hr over 60 Minutes Intravenous  Once 04/24/21 1526 04/24/21 1913       Subjective: -No new complaints, complaint has neck weakness, breathing at baseline  Objective: Vitals:   05/01/21 0754 05/01/21 0923 05/01/21 1143 05/01/21 1300  BP:  (!) 125/59 (!) 125/110 (!) 155/61  Pulse:   92   Resp:   18   Temp:   98.5 F (36.9 C)   TempSrc:   Oral   SpO2: 100%  100%   Weight:      Height:        Intake/Output Summary (Last 24 hours) at 05/01/2021 1353 Last data filed at 05/01/2021 0533 Gross per 24 hour  Intake --  Output 150 ml  Net -150 ml    Filed Weights   04/29/21 0533 04/30/21 0502 05/01/21 0530  Weight: 41.7 kg 41.1 kg 42.4 kg    Examination:  Gen: Frail thinly built female sitting up in bed, AAOx3, no distress HEENT: No JVD CVS: S1-S2, irregularly irregular rhythm Lungs: Decreased breath sounds at the bases Abdomen: Soft, nontender, bowel sounds present Extremities: No edema  Neurological: L ptosis Skin: Mild ecchymosis     Data Reviewed: I have personally reviewed following labs and imaging studies  CBC: Recent Labs  Lab 04/24/21 1527 04/24/21 1538 04/27/21 0120 04/28/21 0113 04/29/21 0218 04/30/21 0305 05/01/21 0306  WBC 8.3   < >  12.1* 13.6* 11.9* 12.3* 12.0*  NEUTROABS 6.6  --  10.5* 11.7* 10.0*  --   --   HGB 6.5*   < > 7.6* 7.7* 7.5* 7.8* 7.5*  HCT 24.5*   < > 26.9* 26.9* 26.5* 27.5* 26.5*  MCV 76.8*   < > 76.0* 75.1* 75.1* 74.5* 74.9*  PLT 544*   < > 494* 485* 461* 458* 411*   < > = values in this interval not displayed.    Basic Metabolic Panel: Recent Labs  Lab 04/24/21 2224 04/25/21 0345 04/27/21 0120 04/28/21 0113 04/29/21 0218 04/30/21 0305 05/01/21 0306  NA  --    < > 140 138 140 138 136  K  --    < > 3.1* 3.5 3.9 3.4* 3.8  CL  --    < > 100 97* 98 95* 97*  CO2  --    < > 33* 32 34* 35* 30  GLUCOSE  --    < > 173* 183* 119* 169* 130*  BUN  --     < > 24* 27* 36* 31* 27*  CREATININE  --    < > 1.14* 1.27* 1.31* 1.25* 1.28*  CALCIUM  --    < > 8.5* 8.5* 9.0 8.8* 9.2  MG 1.9  --  1.9 2.0 2.1  --   --   PHOS  --   --  2.7 2.7 4.4  --   --    < > = values in this interval not displayed.    GFR: Estimated Creatinine Clearance: 25 mL/min (A) (by C-G formula based on SCr of 1.28 mg/dL (H)).  Liver Function Tests: Recent Labs  Lab 04/25/21 0345 04/26/21 1754 04/27/21 0120 04/28/21 0113 04/29/21 0218  AST 12* 14* 11* 14* 17  ALT 14 12 10 13 16   ALKPHOS 48 45 44 44 44  BILITOT 1.2 0.5 0.5 0.6 0.8  PROT 5.8* 5.5* 5.2* 5.4* 5.5*  ALBUMIN 3.2* 3.0* 2.9* 3.1* 3.2*    CBG: Recent Labs  Lab 04/30/21 1117 04/30/21 1653 04/30/21 2134 05/01/21 0746 05/01/21 1142  GLUCAP 183* 253* 221* 149* 180*     Recent Results (from the past 240 hour(s))  Culture, blood (routine x 2)     Status: None   Collection Time: 04/24/21  3:27 PM   Specimen: BLOOD RIGHT ARM  Result Value Ref Range Status   Specimen Description BLOOD RIGHT ARM  Final   Special Requests   Final    BOTTLES DRAWN AEROBIC AND ANAEROBIC Blood Culture results may not be optimal due to an inadequate volume of blood received in culture bottles   Culture   Final    NO GROWTH 5 DAYS Performed at [facility], [place] [address] [place], [place] [address]    Report Status 04/29/2021 FINAL  Final  Resp Panel by RT-PCR (Flu A&B, Covid) Nasopharyngeal Swab     Status: None   Collection Time: 04/24/21  3:39 PM   Specimen: Nasopharyngeal Swab; Nasopharyngeal(NP) swabs in vial transport medium  Result Value Ref Range Status   SARS Coronavirus 2 by RT PCR NEGATIVE NEGATIVE Final    Comment: (NOTE) SARS-CoV-2 target nucleic acids are NOT DETECTED.  The SARS-CoV-2 RNA is generally detectable in upper respiratory specimens during the acute phase of infection. The lowest concentration of SARS-CoV-2 viral copies this assay can detect is 138 copies/mL. A negative result does  not preclude SARS-Cov-2 infection and should not be used as the sole basis for treatment or  other patient management decisions. A negative result may occur with  improper specimen collection/handling, submission of specimen other than nasopharyngeal swab, presence of viral mutation(s) within the areas targeted by this assay, and inadequate number of viral copies(<138 copies/mL). A negative result must be combined with clinical observations, patient history, and epidemiological information. The expected result is Negative.  Fact Sheet for Patients:  EntrepreneurPulse.com.au  Fact Sheet for Healthcare Providers:  IncredibleEmployment.be  This test is no t yet approved or cleared by the [place] FDA and  has been authorized for detection and/or diagnosis of SARS-CoV-2 by FDA under an Emergency Use Authorization (EUA). This EUA will remain  in effect (meaning this test can be used) for the duration of the COVID-19 declaration under Section 564(b)(1) of the Act, 21 U.S.C.section 360bbb-3(b)(1), unless the authorization is terminated  or revoked sooner.       Influenza A by PCR NEGATIVE NEGATIVE Final   Influenza B by PCR NEGATIVE NEGATIVE Final    Comment: (NOTE) The Xpert Xpress SARS-CoV-2/FLU/RSV plus assay is intended as an aid in the diagnosis of influenza from Nasopharyngeal swab specimens and should not be used as a sole basis for treatment. Nasal washings and aspirates are unacceptable for Xpert Xpress SARS-CoV-2/FLU/RSV testing.  Fact Sheet for Patients: EntrepreneurPulse.com.au  Fact Sheet for Healthcare Providers: IncredibleEmployment.be  This test is not yet approved or cleared by the [place] FDA and has been authorized for detection and/or diagnosis of SARS-CoV-2 by FDA under an Emergency Use Authorization (EUA). This EUA will remain in effect (meaning this test can be used) for the  duration of the COVID-19 declaration under Section 564(b)(1) of the Act, 21 U.S.C. section 360bbb-3(b)(1), unless the authorization is terminated or revoked.  Performed at [facility], [place] [address], [place], [place] [address]   Culture, blood (routine x 2)     Status: None   Collection Time: 04/24/21 10:24 PM   Specimen: BLOOD  Result Value Ref Range Status   Specimen Description BLOOD LEFT ANTECUBITAL  Final   Special Requests AEROBIC BOTTLE ONLY Blood Culture adequate volume  Final   Culture   Final    NO GROWTH 5 DAYS Performed at [facility] [address], [place], [place] [address]    Report Status 04/29/2021 FINAL  Final  Urine Culture     Status: None   Collection Time: 04/25/21  5:02 AM   Specimen: Urine, Clean Catch  Result Value Ref Range Status   Specimen Description URINE, CLEAN CATCH  Final   Special Requests NONE  Final   Culture   Final    NO GROWTH Performed at [facility], [place] [address], [place], [place] [address]    Report Status 04/26/2021 FINAL  Final     Scheduled Meds:  diltiazem  60 mg Oral Q6H   hydrALAZINE  25 mg Oral TID   insulin aspart  0-5 Units Subcutaneous QHS   insulin aspart  0-9 Units Subcutaneous TID WC   ipratropium-albuterol  3 mL Nebulization BID   lidocaine  1 patch Transdermal Q24H   mometasone-formoterol  2 puff Inhalation BID   pantoprazole  40 mg Oral Daily   predniSONE  10 mg Oral Q breakfast   sodium chloride flush  3 mL Intravenous Q12H   vitamin B-12  1,000 mcg Oral Daily   Continuous Infusions:  sodium chloride     ferric gluconate (FERRLECIT) IVPB 250 mg (05/01/21 0923)     LOS: 6  days    Time spent: 58min    Domenic Polite, MD [facility]  05/01/2021, 1:53 PM

## 2021-05-01 NOTE — Progress Notes (Signed)
NIF of -32. Done with great effort. No complications

## 2021-05-02 ENCOUNTER — Inpatient Hospital Stay (HOSPITAL_COMMUNITY): Payer: Medicare Other

## 2021-05-02 LAB — CBC
HCT: 26 % — ABNORMAL LOW (ref 36.0–46.0)
Hemoglobin: 7.2 g/dL — ABNORMAL LOW (ref 12.0–15.0)
MCH: 21.2 pg — ABNORMAL LOW (ref 26.0–34.0)
MCHC: 27.7 g/dL — ABNORMAL LOW (ref 30.0–36.0)
MCV: 76.5 fL — ABNORMAL LOW (ref 80.0–100.0)
Platelets: 413 10*3/uL — ABNORMAL HIGH (ref 150–400)
RBC: 3.4 MIL/uL — ABNORMAL LOW (ref 3.87–5.11)
RDW: 17.7 % — ABNORMAL HIGH (ref 11.5–15.5)
WBC: 17.3 10*3/uL — ABNORMAL HIGH (ref 4.0–10.5)
nRBC: 0 % (ref 0.0–0.2)

## 2021-05-02 LAB — GLUCOSE, CAPILLARY
Glucose-Capillary: 139 mg/dL — ABNORMAL HIGH (ref 70–99)
Glucose-Capillary: 153 mg/dL — ABNORMAL HIGH (ref 70–99)
Glucose-Capillary: 275 mg/dL — ABNORMAL HIGH (ref 70–99)
Glucose-Capillary: 131 mg/dL — ABNORMAL HIGH (ref 70–99)

## 2021-05-02 LAB — PREPARE RBC (CROSSMATCH)

## 2021-05-02 LAB — BASIC METABOLIC PANEL
Anion gap: 6 (ref 5–15)
BUN: 25 mg/dL — ABNORMAL HIGH (ref 8–23)
CO2: 30 mmol/L (ref 22–32)
Calcium: 9.7 mg/dL (ref 8.9–10.3)
Chloride: 103 mmol/L (ref 98–111)
Creatinine, Ser: 1.09 mg/dL — ABNORMAL HIGH (ref 0.44–1.00)
GFR, Estimated: 53 mL/min — ABNORMAL LOW (ref >60)
Glucose, Bld: 149 mg/dL — ABNORMAL HIGH (ref 70–99)
Potassium: 4.2 mmol/L (ref 3.5–5.1)
Sodium: 139 mmol/L (ref 135–145)

## 2021-05-02 MED ORDER — SODIUM CHLORIDE 0.9% IV SOLUTION: Freq: Once | INTRAVENOUS | Status: AC

## 2021-05-02 MED ORDER — SALINE SPRAY 0.65 % NA SOLN
1.0000 | NASAL | Status: DC | PRN
  Filled 2021-05-02: qty 44

## 2021-05-02 MED ORDER — FUROSEMIDE 10 MG/ML IJ SOLN
20.0000 mg | Freq: Once | INTRAMUSCULAR | Status: AC
  Administered 2021-05-02: 20 mg via INTRAVENOUS
  Filled 2021-05-02: qty 2

## 2021-05-02 MED ORDER — HYDROXYZINE HCL 25 MG PO TABS: 25.0000 mg | ORAL_TABLET | Freq: Once | ORAL | Status: DC

## 2021-05-02 MED ORDER — COVID-19 MRNA VACC (MODERNA) 100 MCG/0.5ML IM SUSP
0.5000 mL | Freq: Once | INTRAMUSCULAR | Status: AC
  Administered 2021-05-03: 0.5 mL via INTRAMUSCULAR
  Filled 2021-05-02 (×2): qty 0.5

## 2021-05-02 NOTE — Progress Notes (Signed)
Neurology Progress Note  Patient ID: 75 year old female with a 2 week history of diffuse weakness with intermittent double vision and acute attacks of neck weakness favored to be 2/2 neuromuscular junction disorder, most likely myasthenia gravis.  Subjective: Weakness unchanged on exam, VSS. No new neurologic complaints today. MRI brain and c spine did not reveal any etiology for weakness. Ach and LEMS Ab panels neg, reflex to MUSK still pending.  Objective:  Vitals:   05/02/21 1847 05/02/21 1848  BP: (!) 160/60 (!) 160/60  Pulse:  79  Resp: 18 18  Temp: 98.2 F (36.8 C) 98.2 F (36.8 C)  SpO2:  100%    Physical Exam  HEENT-  [place]/AT   Lungs- Respirations unlabored. No tachypnea. No visible use of accessory muscles of respiration.  Abdomen- Nondistended Extremities- Warm and well perfused. Thinning skin with multiple old subcutaneous hemorrhagic lesions.      Neurological Examination Mental Status: Awake, alert and oriented. Speech fluent without evidence of aphasia.  Able to follow all commands without difficulty. Pleasant and cooperative with good recall for recent events.  Cranial Nerves: II: Temporal visual fields intact without extinction to DSS. PERRL.   III,IV, VI: Mild ptosis at baseline. When asked to look upwards for > 30 seconds, the patient's R eye drifts downward by about 2 mm, she experiences double vision. Prior to the above, eyes were conjugate and she was able to gaze to the far right and left as well as up and down without difficulty. No nystagmus.  V,VII: Smile symmetric without weakness. Facial temp sensation subjectively decreased bilaterally.  VIII: Hearing intact to conversation.  IX,X: No pharyngeal dysarthria or hypophonia XI: Neck flexion 4/5, neck rotation 4+/5 to right and left. Neck extension 4-/5. Shoulder shrug is symmetric.  XII: Midline tongue extension. No atrophy or fasciculations noted.  Motor: BUE 4+/5 proximally and distally in the context of  diffusely decreased muscle bulk.  No pronator drift . BLE 4/5 proximally and distally in the context of diffusely decreased muscle bulk. Fatiguable weakness on repeated muscle testing L hip flexor. Sensory: Subjectively decreased temp sensation to BUE and BLE. FT intact x 4. No extinction to DSS.  Deep Tendon Reflexes: 1-2+ and symmetric throughout Cerebellar: No ataxia with FNF bilaterally Gait: Deferred  Data: no e/o malignancy on CT c/a/p w contrast 8/29  A/P: Assessment: 75 year old female with a 2 week history of diffuse weakness with intermittent double vision and acute attacks of neck weakness.  1. Exam reveals moderate weakness of neck extensors, mild weakness of neck flexion and head rotation to left and right. Also with fatiguability with sustained upgaze resulting in double vision, weakness of left eye supraduction and left sided ptosis. Limb strength is slightly decreased in the context of her diffusely decreased muscle bulk.  2. Ddx includes NMJ or other neuromuscular disorder. MRI c spine w/o findings to explain 3. Multiple rib fractures on imaging. May have contributed to the acute worsening of her progressive SOB at home.   4. CT c/a/p with contrast showed no e/o malignancy or thymoma   Recommendations: AChR and LEMS Ab panels neg. MUSK Ab panel still pending. If negative, no further neurologic workup indicated as inpatient. Would recommend close outpatient neurology f/u with EMG of neck muscles incl rep stim. My suspicion for myasthenia at this point is not high enough to treat empirically with PLEX or IVIG. - RT to assess NIF and FVC q 12 hrs - Baclofen and beta-blocker discontinued - Mestinon  d/c'd 2/2 increased secretions - Outpatient Vascular Surgery follow up regarding CTA finding of extensive atheromatous change about the right carotid bulb/proximal right ICA with associated stenosis of up to 75% by NASCET criteria. This finding is not felt to be related to her current  presentation.    Su Monks, MD [facility] [phone]  If 7pm- 7am, please page neurology on call as listed in [place].

## 2021-05-02 NOTE — Progress Notes (Signed)
Sitting PROGRESS NOTE    Kimberly Downs  VAP:[id number] DOB: 09-15-1945 DOA: 04/24/2021 PCP: Lyman Bishop, DO   Chief Complaint  Patient presents with   Shortness of Breath   Brief Narrative:  Kimberly Downs is a 75 y.o. female with medical history significant of GI bleed, anemia, atrial fibrillation, bronchiectasis, CHF, C. difficile, COPD, CAD status post stenting, depression, hypertension, ICD placement, PAD, diabetes, restless leg syndrome who presents with ongoing shortness of breath.  She complained of neck weakness and difficulty ambulating over the past 1.5 weeks when her symptoms started.    Suspected to have myasthenia gravis.  Neurology following and guiding care Also noted to have chronic iron deficiency anemia, seen by gastroenterology this admission, recommended outpatient follow-up for this  Assessment & Plan:   Diffuse weakness, double vision, neck Weakness -friend noted she last walked 1.5 weeks ago and neck weakness started around then as well.  When head drops with neck in flexed position she has trouble breathing.  -On exam she is noted to have moderate neck extensor weakness, mild weakness Flexion and rotation, easy fatigability, double vision, left-sided ptosis -Neurology consulting, myasthenia gravis versus Lambert-Eaton myasthenic syndrome were suspected initially -CT chest abdomen pelvis without evidence of malignancy or thymoma -Myasthenia gravis panel, voltage-gated calcium channel antibody titer (L EMS)-pending, discussed with neurology today, this appears less likely, follow-up reflex antibody panel hopefully will result soon -RT following, daily NIFs>20 -Baclofen and beta-blocker discontinued -She was started on Mestinon by neurology and then discontinued 8/31 on account of increased secretions -If serology is negative will need outpatient neurology follow-up for EMG, repetitive stimulation -Physical therapy following, home PT recommended at  discharge -MRI brain and C-spine were unremarkable, limited by motion artifact  Acute on Chronic Respiratory Failure (on 2 L chronically) 2/2 anemia, COPD,  and neuromuscular weakness as above Follow NIFS -Transfusing PRBC today  Iron Deficiency Anemia  GI Bleed  Hemoccult Positive Presented with Hb 6.5 (11.8 07/2020) S/p 1 unit pRBC with improvement -Seen by gastroenterology in consultation ongoing anemia for 3 to 4 months, supposed to have a EGD through her local GI in [place], on account of current neurological issues gastroenterology recommended follow-up with her local GI in [place] -Hemoglobin down to 7.2, she is symptomatic we will transfuse 1 unit of PRBC, continue IV iron -Monitor CBC  Paroxysmal atrial fibrillation with RVR -Heart rate poorly controlled today, carvedilol was discontinued on account of suspected myasthenia, now on p.o. Cardizem instead -Not appropriate for anticoagulation in setting of severe iron deficiency anemia and heme positive stools  Chronic systolic CHF Severely elevated BNP, clinically doesn't appear volume overloaded -Echo with EF 30-35%, moderately decreased function, global hypokinesis (prior echo with EF 25-30% - see report) -Clinically euvolemic, torsemide on hold, carvedilol discontinued by neurology on account of suspected myasthenia  COPD/chronic respiratory failure on 2 L at home -Stable, no wheezing Continue scheduled and prn nebs NIFs as noted above  Right Carotid Bulb/Proximal R ICA stenosis up to 75% Follow outpatient with vascular  3 mm focal outpouching from supraclinoid L ICA Aneurysm not excluded, likely small vascular infundibulum Follow outpatient  CAD PAD - Holding home Plavix and aspirin in the setting of GI bleed as above -Carvedilol discontinued  Hypertension -Stable   RLS - Continue home ropinirole   Diabetes > Diet controlled - SSI  Polymyalgia Rheumatica - she's on chronic steroids prednisone  10 mg daily, continued  Mild Bilateral Hydroureteronephrosis 7 cc on bladder scan -Dr. Florene Glen discussed with  Dr. Tresa Moore, noted mild finding on CT, likely related to bladder distension at time of study, no recommendation for any additional imaging or workup unless new symptoms or aki   Osteoporosis  Hx Fragility Fractures Upper thoracic and L rib fx noted on imaging here - osteopenia noted on imaging of cervical spine Compression deformities of T2/T3, severe at T3, subacute to chronic bilateral rib fx Recent dexa suggesting osteoporosis She's on chronic steroids notably   DVT prophylaxis: SCDs Code Status: DNR Family Communication: friend over phone 8/27 Disposition:   Status is: : Inpatient level of care appropriate due to severity of illness  Dispo: The patient is from: Home              Anticipated d/c is to: Home              Patient currently is not medically stable to d/c.   Difficult to place patient No  Consultants:  GI, neurology  Procedures:  Echo IMPRESSIONS     1. Left ventricular ejection fraction, by estimation, is 30 to 35%. The  left ventricle has moderately decreased function. The left ventricle  demonstrates global hypokinesis. Indeterminate diastolic filling due to  E-A fusion.   2. Right ventricular systolic function is normal. The right ventricular  size is normal. There is normal pulmonary artery systolic pressure.   3. Left atrial size was severely dilated.   4. The mitral valve is grossly normal. Trivial mitral valve  regurgitation.   5. The aortic valve was not well visualized. Aortic valve regurgitation  is not visualized. No aortic stenosis is present.   6. The inferior vena cava is normal in size with greater than 50%  respiratory variability, suggesting right atrial pressure of 3 mmHg.   Comparison(s): A prior study was performed on 06/16/2017. Prior images  reviewed side by side. Similar LV function.   Antimicrobials:  Anti-infectives  (From admission, onward)    Start     Dose/Rate Route Frequency Ordered Stop   04/24/21 1530  cefTRIAXone (ROCEPHIN) 1 g in sodium chloride 0.9 % 100 mL IVPB        1 g 200 mL/hr over 30 Minutes Intravenous  Once 04/24/21 1526 04/24/21 1809   04/24/21 1530  azithromycin (ZITHROMAX) 500 mg in sodium chloride 0.9 % 250 mL IVPB        500 mg 250 mL/hr over 60 Minutes Intravenous  Once 04/24/21 1526 04/24/21 1913       Subjective: -Slightly worse shortness of breath, feels tired overall  Objective: Vitals:   05/02/21 0021 05/02/21 0443 05/02/21 0649 05/02/21 0803  BP: (!) 157/54 (!) 152/52 (!) 143/97 (!) 163/66  Pulse: 88 97 98 87  Resp: 16 16  15   Temp: 98.2 F (36.8 C) 97.9 F (36.6 C) 97.6 F (36.4 C) 98.7 F (37.1 C)  TempSrc: Oral Oral Oral Oral  SpO2: 100% 97% 96% 99%  Weight:  41.6 kg    Height:        Intake/Output Summary (Last 24 hours) at 05/02/2021 1119 Last data filed at 05/01/2021 2123 Gross per 24 hour  Intake 270 ml  Output 400 ml  Net -130 ml    Filed Weights   04/30/21 0502 05/01/21 0530 05/02/21 0443  Weight: 41.1 kg 42.4 kg 41.6 kg    Examination:  Gen: Frail thinly built female sitting up in bed, AAOx3, no distress HEENT: No JVD CVS: S1-S2, irregularly irregular rhythm Lungs: Decreased breath sounds bases Abdomen: Soft, nontender, bowel sounds  present Extremities: No edema  Neurological: L ptosis Skin: Mild ecchymosis     Data Reviewed: I have personally reviewed following labs and imaging studies  CBC: Recent Labs  Lab 04/27/21 0120 04/28/21 0113 04/29/21 0218 04/30/21 0305 05/01/21 0306 05/02/21 0132  WBC 12.1* 13.6* 11.9* 12.3* 12.0* 17.3*  NEUTROABS 10.5* 11.7* 10.0*  --   --   --   HGB 7.6* 7.7* 7.5* 7.8* 7.5* 7.2*  HCT 26.9* 26.9* 26.5* 27.5* 26.5* 26.0*  MCV 76.0* 75.1* 75.1* 74.5* 74.9* 76.5*  PLT 494* 485* 461* 458* 411* 413*    Basic Metabolic Panel: Recent Labs  Lab 04/27/21 0120 04/28/21 0113 04/29/21 0218  04/30/21 0305 05/01/21 0306 05/02/21 0132  NA 140 138 140 138 136 139  K 3.1* 3.5 3.9 3.4* 3.8 4.2  CL 100 97* 98 95* 97* 103  CO2 33* 32 34* 35* 30 30  GLUCOSE 173* 183* 119* 169* 130* 149*  BUN 24* 27* 36* 31* 27* 25*  CREATININE 1.14* 1.27* 1.31* 1.25* 1.28* 1.09*  CALCIUM 8.5* 8.5* 9.0 8.8* 9.2 9.7  MG 1.9 2.0 2.1  --   --   --   PHOS 2.7 2.7 4.4  --   --   --     GFR: Estimated Creatinine Clearance: 28.8 mL/min (A) (by C-G formula based on SCr of 1.09 mg/dL (H)).  Liver Function Tests: Recent Labs  Lab 04/26/21 1754 04/27/21 0120 04/28/21 0113 04/29/21 0218  AST 14* 11* 14* 17  ALT 12 10 13 16   ALKPHOS 45 44 44 44  BILITOT 0.5 0.5 0.6 0.8  PROT 5.5* 5.2* 5.4* 5.5*  ALBUMIN 3.0* 2.9* 3.1* 3.2*    CBG: Recent Labs  Lab 05/01/21 0746 05/01/21 1142 05/01/21 1641 05/01/21 2120 05/02/21 0730  GLUCAP 149* 180* 146* 214* 139*     Recent Results (from the past 240 hour(s))  Culture, blood (routine x 2)     Status: None   Collection Time: 04/24/21  3:27 PM   Specimen: BLOOD RIGHT ARM  Result Value Ref Range Status   Specimen Description BLOOD RIGHT ARM  Final   Special Requests   Final    BOTTLES DRAWN AEROBIC AND ANAEROBIC Blood Culture results may not be optimal due to an inadequate volume of blood received in culture bottles   Culture   Final    NO GROWTH 5 DAYS Performed at [facility], [place] [address], [place], [place] [address]    Report Status 04/29/2021 FINAL  Final  Resp Panel by RT-PCR (Flu A&B, Covid) Nasopharyngeal Swab     Status: None   Collection Time: 04/24/21  3:39 PM   Specimen: Nasopharyngeal Swab; Nasopharyngeal(NP) swabs in vial transport medium  Result Value Ref Range Status   SARS Coronavirus 2 by RT PCR NEGATIVE NEGATIVE Final    Comment: (NOTE) SARS-CoV-2 target nucleic acids are NOT DETECTED.  The SARS-CoV-2 RNA is generally detectable in upper respiratory specimens during the acute phase of infection. The  lowest concentration of SARS-CoV-2 viral copies this assay can detect is 138 copies/mL. A negative result does not preclude SARS-Cov-2 infection and should not be used as the sole basis for treatment or other patient management decisions. A negative result may occur with  improper specimen collection/handling, submission of specimen other than nasopharyngeal swab, presence of viral mutation(s) within the areas targeted by this assay, and inadequate number of viral copies(<138 copies/mL). A negative result must be combined with clinical observations, patient history, and epidemiological information. The  expected result is Negative.  Fact Sheet for Patients:  EntrepreneurPulse.com.au  Fact Sheet for Healthcare Providers:  IncredibleEmployment.be  This test is no t yet approved or cleared by the [place] FDA and  has been authorized for detection and/or diagnosis of SARS-CoV-2 by FDA under an Emergency Use Authorization (EUA). This EUA will remain  in effect (meaning this test can be used) for the duration of the COVID-19 declaration under Section 564(b)(1) of the Act, 21 U.S.C.section 360bbb-3(b)(1), unless the authorization is terminated  or revoked sooner.       Influenza A by PCR NEGATIVE NEGATIVE Final   Influenza B by PCR NEGATIVE NEGATIVE Final    Comment: (NOTE) The Xpert Xpress SARS-CoV-2/FLU/RSV plus assay is intended as an aid in the diagnosis of influenza from Nasopharyngeal swab specimens and should not be used as a sole basis for treatment. Nasal washings and aspirates are unacceptable for Xpert Xpress SARS-CoV-2/FLU/RSV testing.  Fact Sheet for Patients: EntrepreneurPulse.com.au  Fact Sheet for Healthcare Providers: IncredibleEmployment.be  This test is not yet approved or cleared by the [place] FDA and has been authorized for detection and/or diagnosis of SARS-CoV-2 by FDA under  an Emergency Use Authorization (EUA). This EUA will remain in effect (meaning this test can be used) for the duration of the COVID-19 declaration under Section 564(b)(1) of the Act, 21 U.S.C. section 360bbb-3(b)(1), unless the authorization is terminated or revoked.  Performed at [facility], [place] [address], [place], [place] [address]   Culture, blood (routine x 2)     Status: None   Collection Time: 04/24/21 10:24 PM   Specimen: BLOOD  Result Value Ref Range Status   Specimen Description BLOOD LEFT ANTECUBITAL  Final   Special Requests AEROBIC BOTTLE ONLY Blood Culture adequate volume  Final   Culture   Final    NO GROWTH 5 DAYS Performed at [place] [address] [place], [place] [address]    Report Status 04/29/2021 FINAL  Final  Urine Culture     Status: None   Collection Time: 04/25/21  5:02 AM   Specimen: Urine, Clean Catch  Result Value Ref Range Status   Specimen Description URINE, CLEAN CATCH  Final   Special Requests NONE  Final   Culture   Final    NO GROWTH Performed at [facility], [place] [address] [place], [place] [address]    Report Status 04/26/2021 FINAL  Final     Scheduled Meds:  sodium chloride   Intravenous Once   COVID-19 mRNA vaccine (Moderna)  0.5 mL Intramuscular Once   diltiazem  240 mg Oral Daily   furosemide  20 mg Intravenous Once   hydrALAZINE  25 mg Oral TID   insulin aspart  0-5 Units Subcutaneous QHS   insulin aspart  0-9 Units Subcutaneous TID WC   lidocaine  1 patch Transdermal Q24H   mometasone-formoterol  2 puff Inhalation BID   pantoprazole  40 mg Oral Daily   predniSONE  10 mg Oral Q breakfast   sodium chloride flush  3 mL Intravenous Q12H   vitamin B-12  1,000 mcg Oral Daily   Continuous Infusions:  sodium chloride     ferric gluconate (FERRLECIT) IVPB 250 mg (05/02/21 0912)     LOS: 7 days    Time spent: 11min    Domenic Polite, MD [facility]  05/02/2021, 11:19 AM

## 2021-05-02 NOTE — Progress Notes (Signed)
RT had pt perform NIF. Pt able to obtain -30 on NIF without difficulty. RT will continue to monitor.

## 2021-05-03 ENCOUNTER — Inpatient Hospital Stay (HOSPITAL_COMMUNITY): Payer: Medicare Other

## 2021-05-03 DIAGNOSIS — R400 Somnolence: Secondary | ICD-10-CM

## 2021-05-03 DIAGNOSIS — G709 Myoneural disorder, unspecified: Secondary | ICD-10-CM

## 2021-05-03 LAB — TYPE AND SCREEN
ABO/RH(D): A NEG
Antibody Screen: NEGATIVE
Unit division: 0

## 2021-05-03 LAB — URINALYSIS, ROUTINE W REFLEX MICROSCOPIC
Bilirubin Urine: NEGATIVE
Glucose, UA: NEGATIVE mg/dL
Hgb urine dipstick: NEGATIVE
Ketones, ur: NEGATIVE mg/dL
Leukocytes,Ua: NEGATIVE
Nitrite: NEGATIVE
Protein, ur: NEGATIVE mg/dL
Specific Gravity, Urine: 1.015 (ref 1.005–1.030)
pH: 7 (ref 5.0–8.0)

## 2021-05-03 LAB — CBC
HCT: 31.3 % — ABNORMAL LOW (ref 36.0–46.0)
Hemoglobin: 9.1 g/dL — ABNORMAL LOW (ref 12.0–15.0)
MCH: 22.8 pg — ABNORMAL LOW (ref 26.0–34.0)
MCHC: 29.1 g/dL — ABNORMAL LOW (ref 30.0–36.0)
MCV: 78.3 fL — ABNORMAL LOW (ref 80.0–100.0)
Platelets: 413 10*3/uL — ABNORMAL HIGH (ref 150–400)
RBC: 4 MIL/uL (ref 3.87–5.11)
RDW: 18.8 % — ABNORMAL HIGH (ref 11.5–15.5)
WBC: 20 10*3/uL — ABNORMAL HIGH (ref 4.0–10.5)
nRBC: 0.5 % — ABNORMAL HIGH (ref 0.0–0.2)

## 2021-05-03 LAB — BPAM RBC
Blood Product Expiration Date: 202209102359
ISSUE DATE / TIME: 202209031533
Unit Type and Rh: 600

## 2021-05-03 LAB — BASIC METABOLIC PANEL
Anion gap: 10 (ref 5–15)
BUN: 22 mg/dL (ref 8–23)
CO2: 28 mmol/L (ref 22–32)
Calcium: 9.7 mg/dL (ref 8.9–10.3)
Chloride: 100 mmol/L (ref 98–111)
Creatinine, Ser: 1.13 mg/dL — ABNORMAL HIGH (ref 0.44–1.00)
GFR, Estimated: 50 mL/min — ABNORMAL LOW (ref >60)
Glucose, Bld: 114 mg/dL — ABNORMAL HIGH (ref 70–99)
Potassium: 4.1 mmol/L (ref 3.5–5.1)
Sodium: 138 mmol/L (ref 135–145)

## 2021-05-03 LAB — GLUCOSE, CAPILLARY
Glucose-Capillary: 144 mg/dL — ABNORMAL HIGH (ref 70–99)
Glucose-Capillary: 156 mg/dL — ABNORMAL HIGH (ref 70–99)
Glucose-Capillary: 290 mg/dL — ABNORMAL HIGH (ref 70–99)
Glucose-Capillary: 213 mg/dL — ABNORMAL HIGH (ref 70–99)

## 2021-05-03 MED ORDER — ALPRAZOLAM 0.25 MG PO TABS
0.1250 mg | ORAL_TABLET | Freq: Once | ORAL | Status: AC
  Administered 2021-05-03: 0.125 mg via ORAL
  Filled 2021-05-03: qty 1

## 2021-05-03 MED ORDER — LORAZEPAM 0.5 MG PO TABS
0.5000 mg | ORAL_TABLET | Freq: Once | ORAL | Status: AC
  Administered 2021-05-03: 0.5 mg via ORAL
  Filled 2021-05-03: qty 1

## 2021-05-03 NOTE — Progress Notes (Addendum)
Sitting PROGRESS NOTE    Kimberly Downs  WGN:[id number] DOB: June 11, 1946 DOA: 04/24/2021 PCP: Lyman Bishop, DO   Chief Complaint  Patient presents with   Shortness of Breath   Brief Narrative:  Kimberly Downs is a 75 y.o. female with medical history significant of GI bleed, anemia, atrial fibrillation, bronchiectasis, CHF, C. difficile, COPD, CAD status post stenting, depression, hypertension, ICD placement, PAD, diabetes, restless leg syndrome who presents with ongoing shortness of breath.  She complained of neck weakness and difficulty ambulating over the past 1.5 weeks when her symptoms started.    Suspected to have myasthenia gravis.  Neurology following and guiding care Also noted to have chronic iron deficiency anemia, seen by gastroenterology this admission, recommended outpatient follow-up for this  Subjective: -Anxious and agitated overnight, received Ativan at 2 AM  Assessment & Plan:   Diffuse weakness, double vision, neck Weakness -friend noted she last walked 1.5 weeks ago and neck weakness started around then as well.  When head drops with neck in flexed position she has trouble breathing.  -On exam she is noted to have moderate neck extensor weakness, mild weakness Flexion and rotation, easy fatigability, double vision, left-sided ptosis -Neurology consulting, myasthenia gravis versus Lambert-Eaton myasthenic syndrome were suspected initially -CT chest abdomen pelvis without evidence of malignancy or thymoma -AChR and LEMS Ab panels neg. MuSK Ab panel -pending -RT following, daily NIFs>20 -Baclofen and beta-blocker discontinued -She was started on Mestinon by neurology and then discontinued 8/31 on account of increased secretions -If serology is negative will need outpatient neurology follow-up for EMG, repetitive stimulation -Physical therapy following, home PT recommended at discharge -MRI brain and C-spine were unremarkable, limited by motion  artifact  Leukocytosis -Source is unclear,  steroids could be contributing -Check urinalysis and chest x-ray, procalcitonin, follow-up blood cultures -CBC in a.m. -Some of her lethargy is likely secondary to Ativan received at 2 AM  Acute on Chronic Respiratory Failure (on 2 L chronically) 2/2 anemia, COPD,  and neuromuscular weakness as above Follow NIFS -Transfusing PRBC today  Iron Deficiency Anemia  GI Bleed  Hemoccult Positive Presented with Hb 6.5 (11.8 07/2020) S/p 1 unit pRBC with improvement -Seen by gastroenterology in consultation ongoing anemia for 3 to 4 months, supposed to have a EGD through her local GI in [place], on account of current neurological issues gastroenterology recommended follow-up with her local GI in [place] -Hemoglobin down to 7.2 yesterday, as she was symptomatic transfused 1 unit of PRBC and given IV iron, -Monitor CBC  Paroxysmal atrial fibrillation with RVR -Heart rate poorly controlled today, carvedilol was discontinued on account of suspected myasthenia, now on p.o. Cardizem instead -Not appropriate for anticoagulation in setting of severe iron deficiency anemia and heme positive stools  Chronic systolic CHF Severely elevated BNP, clinically doesn't appear volume overloaded -Echo with EF 30-35%, moderately decreased function, global hypokinesis (prior echo with EF 25-30% - see report) -Clinically euvolemic, torsemide on hold, carvedilol discontinued by neurology on account of suspected myasthenia  COPD/chronic respiratory failure on 2 L at home -Stable, no wheezing Continue scheduled and prn nebs NIFs as noted above  Right Carotid Bulb/Proximal R ICA stenosis up to 75% Follow outpatient with vascular  3 mm focal outpouching from supraclinoid L ICA Aneurysm not excluded, likely small vascular infundibulum Follow outpatient  CAD PAD - Holding home Plavix and aspirin in the setting of GI bleed as above -Carvedilol  discontinued  Hypertension -Stable   RLS - Continue home ropinirole   Diabetes >  Diet controlled - SSI  Polymyalgia Rheumatica - she's on chronic steroids prednisone 10 mg daily, continued  Mild Bilateral Hydroureteronephrosis 7 cc on bladder scan -Dr. Florene Glen discussed with Dr. Tresa Moore, noted mild finding on CT, likely related to bladder distension at time of study, no recommendation for any additional imaging or workup unless new symptoms or aki   Osteoporosis  Hx Fragility Fractures Upper thoracic and L rib fx noted on imaging here - osteopenia noted on imaging of cervical spine Compression deformities of T2/T3, severe at T3, subacute to chronic bilateral rib fx Recent dexa suggesting osteoporosis She's on chronic steroids notably   DVT prophylaxis: SCDs Code Status: DNR Family Communication: She has no immediate family Disposition:   Status is: : Inpatient level of care appropriate due to severity of illness  Dispo: The patient is from: Home              Anticipated d/c is to: Home              Patient currently is not medically stable to d/c.   Difficult to place patient No  Consultants:  GI, neurology  Procedures:  Echo IMPRESSIONS     1. Left ventricular ejection fraction, by estimation, is 30 to 35%. The  left ventricle has moderately decreased function. The left ventricle  demonstrates global hypokinesis. Indeterminate diastolic filling due to  E-A fusion.   2. Right ventricular systolic function is normal. The right ventricular  size is normal. There is normal pulmonary artery systolic pressure.   3. Left atrial size was severely dilated.   4. The mitral valve is grossly normal. Trivial mitral valve  regurgitation.   5. The aortic valve was not well visualized. Aortic valve regurgitation  is not visualized. No aortic stenosis is present.   6. The inferior vena cava is normal in size with greater than 50%  respiratory variability, suggesting right atrial  pressure of 3 mmHg.   Comparison(s): A prior study was performed on 06/16/2017. Prior images  reviewed side by side. Similar LV function.   Antimicrobials:  Anti-infectives (From admission, onward)    Start     Dose/Rate Route Frequency Ordered Stop   04/24/21 1530  cefTRIAXone (ROCEPHIN) 1 g in sodium chloride 0.9 % 100 mL IVPB        1 g 200 mL/hr over 30 Minutes Intravenous  Once 04/24/21 1526 04/24/21 1809   04/24/21 1530  azithromycin (ZITHROMAX) 500 mg in sodium chloride 0.9 % 250 mL IVPB        500 mg 250 mL/hr over 60 Minutes Intravenous  Once 04/24/21 1526 04/24/21 1913       Objective: Vitals:   05/02/21 1848 05/02/21 2000 05/03/21 0520 05/03/21 0737  BP: (!) 160/60 (!) 147/71 (!) 135/51   Pulse: 79 (!) 103 69   Resp: 18 18 20    Temp: 98.2 F (36.8 C) 98.5 F (36.9 C) 98 F (36.7 C)   TempSrc: Oral Oral Oral   SpO2: 100% (!) 89% 92% 97%  Weight:   44.4 kg   Height:        Intake/Output Summary (Last 24 hours) at 05/03/2021 1142 Last data filed at 05/02/2021 2324 Gross per 24 hour  Intake 1056 ml  Output 800 ml  Net 256 ml    Filed Weights   05/01/21 0530 05/02/21 0443 05/03/21 0520  Weight: 42.4 kg 41.6 kg 44.4 kg    Examination:  Gen: Frail thinly built female laying in bed, somnolent but easily  arousable, oriented x3, no distress CVS: S1-S2, irregularly irregular rhythm Lungs: Decreased breath sounds the bases Abdomen: Soft, nontender, bowel sounds present Extremities: No edema Neurological: Mild left ptosis, moves all extremities, no localizing signs Skin: Mild ecchymosis     Data Reviewed: I have personally reviewed following labs and imaging studies  CBC: Recent Labs  Lab 04/27/21 0120 04/28/21 0113 04/29/21 0218 04/30/21 0305 05/01/21 0306 05/02/21 0132 05/03/21 0206  WBC 12.1* 13.6* 11.9* 12.3* 12.0* 17.3* 20.0*  NEUTROABS 10.5* 11.7* 10.0*  --   --   --   --   HGB 7.6* 7.7* 7.5* 7.8* 7.5* 7.2* 9.1*  HCT 26.9* 26.9* 26.5* 27.5*  26.5* 26.0* 31.3*  MCV 76.0* 75.1* 75.1* 74.5* 74.9* 76.5* 78.3*  PLT 494* 485* 461* 458* 411* 413* 413*    Basic Metabolic Panel: Recent Labs  Lab 04/27/21 0120 04/28/21 0113 04/29/21 0218 04/30/21 0305 05/01/21 0306 05/02/21 0132 05/03/21 0206  NA 140 138 140 138 136 139 138  K 3.1* 3.5 3.9 3.4* 3.8 4.2 4.1  CL 100 97* 98 95* 97* 103 100  CO2 33* 32 34* 35* 30 30 28   GLUCOSE 173* 183* 119* 169* 130* 149* 114*  BUN 24* 27* 36* 31* 27* 25* 22  CREATININE 1.14* 1.27* 1.31* 1.25* 1.28* 1.09* 1.13*  CALCIUM 8.5* 8.5* 9.0 8.8* 9.2 9.7 9.7  MG 1.9 2.0 2.1  --   --   --   --   PHOS 2.7 2.7 4.4  --   --   --   --     GFR: Estimated Creatinine Clearance: 29.7 mL/min (A) (by C-G formula based on SCr of 1.13 mg/dL (H)).  Liver Function Tests: Recent Labs  Lab 04/26/21 1754 04/27/21 0120 04/28/21 0113 04/29/21 0218  AST 14* 11* 14* 17  ALT 12 10 13 16   ALKPHOS 45 44 44 44  BILITOT 0.5 0.5 0.6 0.8  PROT 5.5* 5.2* 5.4* 5.5*  ALBUMIN 3.0* 2.9* 3.1* 3.2*    CBG: Recent Labs  Lab 05/02/21 0730 05/02/21 1150 05/02/21 1647 05/02/21 2105 05/03/21 0750  GLUCAP 139* 153* 275* 131* 144*     Recent Results (from the past 240 hour(s))  Culture, blood (routine x 2)     Status: None   Collection Time: 04/24/21  3:27 PM   Specimen: BLOOD RIGHT ARM  Result Value Ref Range Status   Specimen Description BLOOD RIGHT ARM  Final   Special Requests   Final    BOTTLES DRAWN AEROBIC AND ANAEROBIC Blood Culture results may not be optimal due to an inadequate volume of blood received in culture bottles   Culture   Final    NO GROWTH 5 DAYS Performed at [facility], [place] [address], [place], [place] [address]    Report Status 04/29/2021 FINAL  Final  Resp Panel by RT-PCR (Flu A&B, Covid) Nasopharyngeal Swab     Status: None   Collection Time: 04/24/21  3:39 PM   Specimen: Nasopharyngeal Swab; Nasopharyngeal(NP) swabs in vial transport medium  Result Value Ref Range Status    SARS Coronavirus 2 by RT PCR NEGATIVE NEGATIVE Final    Comment: (NOTE) SARS-CoV-2 target nucleic acids are NOT DETECTED.  The SARS-CoV-2 RNA is generally detectable in upper respiratory specimens during the acute phase of infection. The lowest concentration of SARS-CoV-2 viral copies this assay can detect is 138 copies/mL. A negative result does not preclude SARS-Cov-2 infection and should not be used as the sole basis for treatment or other  patient management decisions. A negative result may occur with  improper specimen collection/handling, submission of specimen other than nasopharyngeal swab, presence of viral mutation(s) within the areas targeted by this assay, and inadequate number of viral copies(<138 copies/mL). A negative result must be combined with clinical observations, patient history, and epidemiological information. The expected result is Negative.  Fact Sheet for Patients:  EntrepreneurPulse.com.au  Fact Sheet for Healthcare Providers:  IncredibleEmployment.be  This test is no t yet approved or cleared by the [place] FDA and  has been authorized for detection and/or diagnosis of SARS-CoV-2 by FDA under an Emergency Use Authorization (EUA). This EUA will remain  in effect (meaning this test can be used) for the duration of the COVID-19 declaration under Section 564(b)(1) of the Act, 21 U.S.C.section 360bbb-3(b)(1), unless the authorization is terminated  or revoked sooner.       Influenza A by PCR NEGATIVE NEGATIVE Final   Influenza B by PCR NEGATIVE NEGATIVE Final    Comment: (NOTE) The Xpert Xpress SARS-CoV-2/FLU/RSV plus assay is intended as an aid in the diagnosis of influenza from Nasopharyngeal swab specimens and should not be used as a sole basis for treatment. Nasal washings and aspirates are unacceptable for Xpert Xpress SARS-CoV-2/FLU/RSV testing.  Fact Sheet for  Patients: EntrepreneurPulse.com.au  Fact Sheet for Healthcare Providers: IncredibleEmployment.be  This test is not yet approved or cleared by the [place] FDA and has been authorized for detection and/or diagnosis of SARS-CoV-2 by FDA under an Emergency Use Authorization (EUA). This EUA will remain in effect (meaning this test can be used) for the duration of the COVID-19 declaration under Section 564(b)(1) of the Act, 21 U.S.C. section 360bbb-3(b)(1), unless the authorization is terminated or revoked.  Performed at [facility], [place] [address] [place], [place] [address]   Culture, blood (routine x 2)     Status: None   Collection Time: 04/24/21 10:24 PM   Specimen: BLOOD  Result Value Ref Range Status   Specimen Description BLOOD LEFT ANTECUBITAL  Final   Special Requests AEROBIC BOTTLE ONLY Blood Culture adequate volume  Final   Culture   Final    NO GROWTH 5 DAYS Performed at [place] [address], [place], [place] [address]    Report Status 04/29/2021 FINAL  Final  Urine Culture     Status: None   Collection Time: 04/25/21  5:02 AM   Specimen: Urine, Clean Catch  Result Value Ref Range Status   Specimen Description URINE, CLEAN CATCH  Final   Special Requests NONE  Final   Culture   Final    NO GROWTH Performed at [facility], [place] [address], [place], [place] [address]    Report Status 04/26/2021 FINAL  Final     Scheduled Meds:  COVID-19 mRNA vaccine (Moderna)  0.5 mL Intramuscular Once   diltiazem  240 mg Oral Daily   hydrALAZINE  25 mg Oral TID   insulin aspart  0-5 Units Subcutaneous QHS   insulin aspart  0-9 Units Subcutaneous TID WC   lidocaine  1 patch Transdermal Q24H   mometasone-formoterol  2 puff Inhalation BID   pantoprazole  40 mg Oral Daily   predniSONE  10 mg Oral Q breakfast   sodium chloride flush  3 mL Intravenous Q12H   vitamin B-12  1,000 mcg Oral Daily   Continuous  Infusions:  sodium chloride       LOS: 8 days    Time spent: 16min  Domenic Polite, MD [facility]  05/03/2021, 11:42 AM

## 2021-05-03 NOTE — Progress Notes (Signed)
Neurology Progress Note  Patient ID: 75 year old female with a 2 week history of diffuse weakness with intermittent double vision and neck weakness. Concern for possible neuromuscular disorder contributing. MRI c spine without explanation for neck weakness. AChR Ab and LEMS panel negative. Send out reflex to MuSK Ab pending.   Subjective: Patient received ativan overnight for anxiety and is somnolent this AM. Leukocytosis to WBC 20. When I was able to briefly wake her up she endorsed new pain on urination and worsening cough.  Objective:  Vitals:   05/03/21 0520 05/03/21 0737  BP: (!) 135/51   Pulse: 69   Resp: 20   Temp: 98 F (36.7 C)   SpO2: 92% 97%    Physical Exam  HEENT-  [place]/AT   Lungs- Respirations unlabored. No tachypnea. No visible use of accessory muscles of respiration.  Abdomen- Nondistended Extremities- Warm and well perfused. Thinning skin with multiple old subcutaneous hemorrhagic lesions.      Neurological Examination Mental Status: somnolent, arousable but only for <1 min before falling back asleep. Oriented x2 Speech: mod dysarthria, no aphasia Cranial Nerves: PERRL, blinks to threat bilat, face symmetric, hearing intact to voice.  Motor & sensory: Unable to complete formal strength exam 2/2 somnolence, withdraws to noxious stimuli in all extremities.   See my progress note from yesterday for full recent neurologic exam when patient was awake  Data: no e/o malignancy on CT c/a/p w contrast 8/29  A/P: Assessment: 75 year old female with a 2 week history of diffuse weakness with intermittent double vision and acute attacks of neck weakness.  1. Exam yesterday revealed moderate weakness of neck extensors, mild weakness of neck flexion and head rotation to left and right. Also with fatiguability with sustained upgaze resulting in double vision, weakness of left eye supraduction and left sided ptosis on admission per Dr. Yvetta Coder report. Limb strength is slightly  decreased in the context of her diffusely decreased muscle bulk.  2. Ddx includes NMJ or other neuromuscular disorder. MRI c spine w/o findings to explain 3. Multiple rib fractures on imaging. May have contributed to the acute worsening of her progressive SOB at home.   4. CT c/a/p with contrast showed no e/o malignancy or thymoma   Recommendations:  # Somnolence, leukocytosis, dysuria, worsening cough - Somnolence may be due in part to overnight ativan but also concern for infection - Ordered infectious workup UA/Ucx, CXR, blood cultures - D/w Dr. Broadus John by phone - Low threshold to start empiric abx and move to higher level of care given low respiratory reserve; NIF this AM -25  # Neck weakness # Ptosis on admission - AChR and LEMS Ab panels neg. MuSK Ab panel still pending. If negative, no further neurologic workup indicated as inpatient. Would recommend close outpatient neurology f/u with EMG of neck muscles incl rep stim. My suspicion for myasthenia at this point is not high enough to treat empirically with PLEX or IVIG. - RT to assess NIF and FVC q 12 hrs - Baclofen and beta-blocker discontinued - Mestinon d/c'd 2/2 increased secretions - Outpatient Vascular Surgery follow up regarding CTA finding of extensive atheromatous change about the right carotid bulb/proximal right ICA with associated stenosis of up to 75% by NASCET criteria. This finding is not felt to be related to her current presentation.    Su Monks, MD [facility] [phone]  If 7pm- 7am, please page neurology on call as listed in [place].

## 2021-05-03 NOTE — Progress Notes (Signed)
Nif -25. Performed with great effort. RT will continue to monitor.

## 2021-05-03 NOTE — Plan of Care (Signed)

## 2021-05-04 LAB — BASIC METABOLIC PANEL
Anion gap: 8 (ref 5–15)
BUN: 22 mg/dL (ref 8–23)
CO2: 25 mmol/L (ref 22–32)
Calcium: 9 mg/dL (ref 8.9–10.3)
Chloride: 103 mmol/L (ref 98–111)
Creatinine, Ser: 0.9 mg/dL (ref 0.44–1.00)
GFR, Estimated: >60 mL/min (ref >60)
Glucose, Bld: 157 mg/dL — ABNORMAL HIGH (ref 70–99)
Potassium: 4.5 mmol/L (ref 3.5–5.1)
Sodium: 136 mmol/L (ref 135–145)

## 2021-05-04 LAB — CBC
HCT: 27.5 % — ABNORMAL LOW (ref 36.0–46.0)
Hemoglobin: 8.2 g/dL — ABNORMAL LOW (ref 12.0–15.0)
MCH: 23.3 pg — ABNORMAL LOW (ref 26.0–34.0)
MCHC: 29.8 g/dL — ABNORMAL LOW (ref 30.0–36.0)
MCV: 78.1 fL — ABNORMAL LOW (ref 80.0–100.0)
Platelets: 351 10*3/uL (ref 150–400)
RBC: 3.52 MIL/uL — ABNORMAL LOW (ref 3.87–5.11)
RDW: 19.9 % — ABNORMAL HIGH (ref 11.5–15.5)
WBC: 14.6 10*3/uL — ABNORMAL HIGH (ref 4.0–10.5)
nRBC: 0.4 % — ABNORMAL HIGH (ref 0.0–0.2)

## 2021-05-04 LAB — URINE CULTURE
Culture: NO GROWTH
Special Requests: NORMAL

## 2021-05-04 LAB — PROCALCITONIN: Procalcitonin: 0.1 ng/mL

## 2021-05-04 LAB — GLUCOSE, CAPILLARY
Glucose-Capillary: 154 mg/dL — ABNORMAL HIGH (ref 70–99)
Glucose-Capillary: 217 mg/dL — ABNORMAL HIGH (ref 70–99)
Glucose-Capillary: 337 mg/dL — ABNORMAL HIGH (ref 70–99)
Glucose-Capillary: 219 mg/dL — ABNORMAL HIGH (ref 70–99)

## 2021-05-04 MED ORDER — ALPRAZOLAM 0.25 MG PO TABS
0.1250 mg | ORAL_TABLET | Freq: Once | ORAL | Status: AC
  Administered 2021-05-04: 0.125 mg via ORAL
  Filled 2021-05-04: qty 1

## 2021-05-04 MED ORDER — IPRATROPIUM-ALBUTEROL 0.5-2.5 (3) MG/3ML IN SOLN
3.0000 mL | Freq: Three times a day (TID) | RESPIRATORY_TRACT | Status: DC
  Administered 2021-05-05: 3 mL via RESPIRATORY_TRACT
  Filled 2021-05-04: qty 3

## 2021-05-04 NOTE — Progress Notes (Signed)
NIF- -22cmH2o, -25cmH20, -22cmH2o with good effort.

## 2021-05-04 NOTE — Progress Notes (Addendum)
Sitting PROGRESS NOTE    Kimberly Downs  OMV:[id number] DOB: 12-21-1945 DOA: 04/24/2021 PCP: Lyman Bishop, DO   Chief Complaint  Patient presents with   Shortness of Breath   Brief Narrative:  Kimberly Downs is a 75 y.o. female with medical history significant of GI bleed, anemia, atrial fibrillation, bronchiectasis, CHF, C. difficile, COPD, CAD status post stenting, depression, hypertension, ICD placement, PAD, diabetes, restless leg syndrome who presents with ongoing shortness of breath.  She complained of neck weakness and difficulty ambulating over the past 1.5 weeks when her symptoms started.    Suspected to have myasthenia gravis.  Neurology following and guiding care Also noted to have chronic iron deficiency anemia, seen by gastroenterology this admission, recommended outpatient follow-up for this  Subjective: -Feels okay overall, complains of neck weakness, breathing is at baseline, afebrile, no nausea or vomiting  Assessment & Plan:   Diffuse weakness, double vision, neck Weakness -friend noted she last walked 1.5 weeks ago and neck weakness started around then as well.  When head drops with neck in flexed position she has trouble breathing.  -On exam she is noted to have moderate neck extensor weakness, mild weakness Flexion and rotation, easy fatigability, double vision, left-sided ptosis -Neurology consulting, myasthenia gravis versus Lambert-Eaton myasthenic syndrome were suspected initially -CT chest abdomen pelvis without evidence of malignancy or thymoma -AChR and LEMS Ab panels neg. MuSK Ab panel -pending -RT following, daily NIFs>20 -Baclofen and beta-blocker discontinued -She was started on Mestinon by neurology and then discontinued 8/31 on account of increased secretions -If serology is negative will need outpatient neurology follow-up for EMG, repetitive stimulation -Physical therapy following, home PT recommended at discharge -MRI brain and C-spine were  unremarkable, limited by motion artifact -Discharge planning, home with home health services tomorrow if stable  Leukocytosis -likely reactive, steroids also contributing -Urinalysis and chest x-ray unremarkable, mental status back to baseline -Benzos yesterday contributed to some lethargy  Acute on Chronic Respiratory Failure (on 2 L chronically) 2/2 anemia, COPD,  and neuromuscular weakness as above Follow NIFS -Transfuse PRBC  Iron Deficiency Anemia  GI Bleed  Hemoccult Positive Presented with Hb 6.5 (11.8 07/2020) S/p 1 unit pRBC with improvement -Seen by gastroenterology in consultation ongoing anemia for 3 to 4 months, supposed to have a EGD through her local GI in [place], on account of current neurological issues gastroenterology recommended follow-up with her local GI in [place] -Hemoglobin down to 7.2 few days ago, transfuse 1 unit of PRBC, also given IV iron this admission -Monitor CBC  Paroxysmal atrial fibrillation with RVR -Heart rate poorly controlled today, carvedilol was discontinued on account of suspected myasthenia, now on p.o. Cardizem instead -Not appropriate for anticoagulation in setting of severe iron deficiency anemia and heme positive stools  Chronic systolic CHF Severely elevated BNP, clinically doesn't appear volume overloaded -Echo with EF 30-35%, moderately decreased function, global hypokinesis (prior echo with EF 25-30% - see report) -Clinically euvolemic, torsemide on hold, carvedilol discontinued by neurology on account of suspected myasthenia  COPD/chronic respiratory failure on 2 L at home -Stable, no wheezing Continue scheduled and prn nebs NIFs as noted above  Right Carotid Bulb/Proximal R ICA stenosis up to 75% Follow outpatient with vascular  3 mm focal outpouching from supraclinoid L ICA Aneurysm not excluded, likely small vascular infundibulum Follow outpatient  CAD PAD - Holding home Plavix and aspirin in the setting  of GI bleed as above -Carvedilol discontinued  Hypertension -Stable   RLS - Continue home  ropinirole   Diabetes > Diet controlled - SSI  Polymyalgia Rheumatica - she's on chronic steroids prednisone 10 mg daily, continued  Mild Bilateral Hydroureteronephrosis 7 cc on bladder scan -Dr. Florene Glen discussed with Dr. Tresa Moore, noted mild finding on CT, likely related to bladder distension at time of study, no recommendation for any additional imaging or workup unless new symptoms or aki   Osteoporosis  Hx Fragility Fractures Upper thoracic and L rib fx noted on imaging here - osteopenia noted on imaging of cervical spine Compression deformities of T2/T3, severe at T3, subacute to chronic bilateral rib fx Recent dexa suggesting osteoporosis She's on chronic steroids notably   DVT prophylaxis: SCDs Code Status: DNR Family Communication: She has no immediate family Disposition:   Status is: : Inpatient level of care appropriate due to severity of illness  Dispo: The patient is from: Home              Anticipated d/c is to: Home              Patient currently is not medically stable to d/c.   Difficult to place patient No  Consultants:  GI, neurology  Procedures:  Echo IMPRESSIONS     1. Left ventricular ejection fraction, by estimation, is 30 to 35%. The  left ventricle has moderately decreased function. The left ventricle  demonstrates global hypokinesis. Indeterminate diastolic filling due to  E-A fusion.   2. Right ventricular systolic function is normal. The right ventricular  size is normal. There is normal pulmonary artery systolic pressure.   3. Left atrial size was severely dilated.   4. The mitral valve is grossly normal. Trivial mitral valve  regurgitation.   5. The aortic valve was not well visualized. Aortic valve regurgitation  is not visualized. No aortic stenosis is present.   6. The inferior vena cava is normal in size with greater than 50%  respiratory  variability, suggesting right atrial pressure of 3 mmHg.   Comparison(s): A prior study was performed on 06/16/2017. Prior images  reviewed side by side. Similar LV function.   Antimicrobials:  Anti-infectives (From admission, onward)    Start     Dose/Rate Route Frequency Ordered Stop   04/24/21 1530  cefTRIAXone (ROCEPHIN) 1 g in sodium chloride 0.9 % 100 mL IVPB        1 g 200 mL/hr over 30 Minutes Intravenous  Once 04/24/21 1526 04/24/21 1809   04/24/21 1530  azithromycin (ZITHROMAX) 500 mg in sodium chloride 0.9 % 250 mL IVPB        500 mg 250 mL/hr over 60 Minutes Intravenous  Once 04/24/21 1526 04/24/21 1913       Objective: Vitals:   05/04/21 0644 05/04/21 0842 05/04/21 0857 05/04/21 0859  BP:  (!) 173/74 (!) 173/74   Pulse:   90   Resp:   20   Temp:      TempSrc:      SpO2:   100% 100%  Weight: 42.6 kg     Height:        Intake/Output Summary (Last 24 hours) at 05/04/2021 1154 Last data filed at 05/04/2021 0600 Gross per 24 hour  Intake 360 ml  Output --  Net 360 ml    Filed Weights   05/02/21 0443 05/03/21 0520 05/04/21 0644  Weight: 41.6 kg 44.4 kg 42.6 kg    Examination:  Gen: Thinly built elderly female sitting up in bed, awake alert oriented x3, no distress CVS: S1-S2, irregularly irregular  rhythm Lungs: Decreased breath sounds at the bases, poor air movement bilaterally Abdomen: Soft, nontender, bowel sounds present Extremities: No edema Neurological: Mild left ptosis, moves all extremities, no localizing signs Skin: Mild ecchymosis     Data Reviewed: I have personally reviewed following labs and imaging studies  CBC: Recent Labs  Lab 04/28/21 0113 04/29/21 0218 04/30/21 0305 05/01/21 0306 05/02/21 0132 05/03/21 0206 05/04/21 0115  WBC 13.6* 11.9* 12.3* 12.0* 17.3* 20.0* 14.6*  NEUTROABS 11.7* 10.0*  --   --   --   --   --   HGB 7.7* 7.5* 7.8* 7.5* 7.2* 9.1* 8.2*  HCT 26.9* 26.5* 27.5* 26.5* 26.0* 31.3* 27.5*  MCV 75.1* 75.1* 74.5*  74.9* 76.5* 78.3* 78.1*  PLT 485* 461* 458* 411* 413* 413* 478    Basic Metabolic Panel: Recent Labs  Lab 04/28/21 0113 04/29/21 0218 04/30/21 0305 05/01/21 0306 05/02/21 0132 05/03/21 0206 05/04/21 0115  NA 138 140 138 136 139 138 136  K 3.5 3.9 3.4* 3.8 4.2 4.1 4.5  CL 97* 98 95* 97* 103 100 103  CO2 32 34* 35* 30 30 28 25   GLUCOSE 183* 119* 169* 130* 149* 114* 157*  BUN 27* 36* 31* 27* 25* 22 22  CREATININE 1.27* 1.31* 1.25* 1.28* 1.09* 1.13* 0.90  CALCIUM 8.5* 9.0 8.8* 9.2 9.7 9.7 9.0  MG 2.0 2.1  --   --   --   --   --   PHOS 2.7 4.4  --   --   --   --   --     GFR: Estimated Creatinine Clearance: 35.8 mL/min (by C-G formula based on SCr of 0.9 mg/dL).  Liver Function Tests: Recent Labs  Lab 04/28/21 0113 04/29/21 0218  AST 14* 17  ALT 13 16  ALKPHOS 44 44  BILITOT 0.6 0.8  PROT 5.4* 5.5*  ALBUMIN 3.1* 3.2*    CBG: Recent Labs  Lab 05/03/21 0750 05/03/21 1228 05/03/21 1605 05/03/21 2120 05/04/21 0741  GLUCAP 144* 156* 290* 213* 154*     Recent Results (from the past 240 hour(s))  Culture, blood (routine x 2)     Status: None   Collection Time: 04/24/21  3:27 PM   Specimen: BLOOD RIGHT ARM  Result Value Ref Range Status   Specimen Description BLOOD RIGHT ARM  Final   Special Requests   Final    BOTTLES DRAWN AEROBIC AND ANAEROBIC Blood Culture results may not be optimal due to an inadequate volume of blood received in culture bottles   Culture   Final    NO GROWTH 5 DAYS Performed at [facility], [place] [address] [place], [place] [address]    Report Status 04/29/2021 FINAL  Final  Resp Panel by RT-PCR (Flu A&B, Covid) Nasopharyngeal Swab     Status: None   Collection Time: 04/24/21  3:39 PM   Specimen: Nasopharyngeal Swab; Nasopharyngeal(NP) swabs in vial transport medium  Result Value Ref Range Status   SARS Coronavirus 2 by RT PCR NEGATIVE NEGATIVE Final    Comment: (NOTE) SARS-CoV-2 target nucleic acids are NOT DETECTED.  The  SARS-CoV-2 RNA is generally detectable in upper respiratory specimens during the acute phase of infection. The lowest concentration of SARS-CoV-2 viral copies this assay can detect is 138 copies/mL. A negative result does not preclude SARS-Cov-2 infection and should not be used as the sole basis for treatment or other patient management decisions. A negative result may occur with  improper specimen collection/handling, submission of specimen  other than nasopharyngeal swab, presence of viral mutation(s) within the areas targeted by this assay, and inadequate number of viral copies(<138 copies/mL). A negative result must be combined with clinical observations, patient history, and epidemiological information. The expected result is Negative.  Fact Sheet for Patients:  EntrepreneurPulse.com.au  Fact Sheet for Healthcare Providers:  IncredibleEmployment.be  This test is no t yet approved or cleared by the [place] FDA and  has been authorized for detection and/or diagnosis of SARS-CoV-2 by FDA under an Emergency Use Authorization (EUA). This EUA will remain  in effect (meaning this test can be used) for the duration of the COVID-19 declaration under Section 564(b)(1) of the Act, 21 U.S.C.section 360bbb-3(b)(1), unless the authorization is terminated  or revoked sooner.       Influenza A by PCR NEGATIVE NEGATIVE Final   Influenza B by PCR NEGATIVE NEGATIVE Final    Comment: (NOTE) The Xpert Xpress SARS-CoV-2/FLU/RSV plus assay is intended as an aid in the diagnosis of influenza from Nasopharyngeal swab specimens and should not be used as a sole basis for treatment. Nasal washings and aspirates are unacceptable for Xpert Xpress SARS-CoV-2/FLU/RSV testing.  Fact Sheet for Patients: EntrepreneurPulse.com.au  Fact Sheet for Healthcare Providers: IncredibleEmployment.be  This test is not yet approved or  cleared by the [place] FDA and has been authorized for detection and/or diagnosis of SARS-CoV-2 by FDA under an Emergency Use Authorization (EUA). This EUA will remain in effect (meaning this test can be used) for the duration of the COVID-19 declaration under Section 564(b)(1) of the Act, 21 U.S.C. section 360bbb-3(b)(1), unless the authorization is terminated or revoked.  Performed at [facility], [place] [address], [place], [place] [address]   Culture, blood (routine x 2)     Status: None   Collection Time: 04/24/21 10:24 PM   Specimen: BLOOD  Result Value Ref Range Status   Specimen Description BLOOD LEFT ANTECUBITAL  Final   Special Requests AEROBIC BOTTLE ONLY Blood Culture adequate volume  Final   Culture   Final    NO GROWTH 5 DAYS Performed at [place] [address] [place], [place] [address]    Report Status 04/29/2021 FINAL  Final  Urine Culture     Status: None   Collection Time: 04/25/21  5:02 AM   Specimen: Urine, Clean Catch  Result Value Ref Range Status   Specimen Description URINE, CLEAN CATCH  Final   Special Requests NONE  Final   Culture   Final    NO GROWTH Performed at [facility], [place] [address] [place], [place] [address]    Report Status 04/26/2021 FINAL  Final  Culture, blood (routine x 2)     Status: None (Preliminary result)   Collection Time: 05/03/21  9:39 AM   Specimen: BLOOD  Result Value Ref Range Status   Specimen Description BLOOD SITE NOT SPECIFIED  Final   Special Requests   Final    AEROBIC BOTTLE ONLY Blood Culture results may not be optimal due to an inadequate volume of blood received in culture bottles   Culture   Final    NO GROWTH < 24 HOURS Performed at [facility], [place] [address] [place], [place] [address]    Report Status PENDING  Incomplete  Culture, blood (routine x 2)     Status: None (Preliminary result)   Collection Time: 05/03/21  9:39 AM   Specimen: BLOOD  Result Value Ref  Range Status  Specimen Description BLOOD SITE NOT SPECIFIED  Final   Special Requests   Final    AEROBIC BOTTLE ONLY Blood Culture results may not be optimal due to an inadequate volume of blood received in culture bottles   Culture   Final    NO GROWTH < 24 HOURS Performed at [place] [address], [place], [place] [address]    Report Status PENDING  Incomplete  Urine Culture     Status: None   Collection Time: 05/03/21  3:10 PM   Specimen: Urine, Clean Catch  Result Value Ref Range Status   Specimen Description URINE, CLEAN CATCH  Final   Special Requests Normal  Final   Culture   Final    NO GROWTH Performed at [facility], [place] [address], [place], [place] [address]    Report Status 05/04/2021 FINAL  Final     Scheduled Meds:  diltiazem  240 mg Oral Daily   hydrALAZINE  25 mg Oral TID   insulin aspart  0-5 Units Subcutaneous QHS   insulin aspart  0-9 Units Subcutaneous TID WC   lidocaine  1 patch Transdermal Q24H   mometasone-formoterol  2 puff Inhalation BID   pantoprazole  40 mg Oral Daily   predniSONE  10 mg Oral Q breakfast   sodium chloride flush  3 mL Intravenous Q12H   vitamin B-12  1,000 mcg Oral Daily   Continuous Infusions:  sodium chloride       LOS: 9 days    Time spent: 71min    Domenic Polite, MD [facility]  05/04/2021, 11:54 AM

## 2021-05-04 NOTE — Progress Notes (Signed)
Physical Therapy Treatment Patient Details Name: Kimberly Downs MRN: [id number] DOB: [birth year] Today's Date: 05/04/2021    History of Present Illness Pt is a 75 y.o. female admitted 04/24/21 with ongoing SOB and 2-wk h/o diffuse weakness, intermittent double vision, neck weakness. Imaging notivate for osteopenia, upper thoracic fx, L rib fx. Workup for CHF/COPD exacerbation. Other symptoms favored to be secondary to neuromuscular junction disorder, concern for myasthenia gravis; Per neuro: MRI c spine without explanation for neck weakness, AChR Ab and LEMS panel negative, send out reflex to MuSK Ab pending. PMH includes afib, CHF, COPD, CAD, HTN, ICD, PAD, DM, RLS, depression.    PT Comments    Pt's progress with mobility was limited today by fatigue, urinary urgency, and dizziness with position changes, see BP below. Pt is able to perform all mobility at a min guard level with a rollator, but was only able to tolerate ambulating up to ~8 ft this date. If pt has the necessary level of care to assist her at home she could go home with HHPT. However, if her functional mobility and endurance continue to decline she may need to receive short-term rehab at a SNF first. Will continue to follow acutely to assess follow-up PT needs.    BP:  148/76 supine 144/74 sitting 132/95 standing (dizziness reported) 149/82 sitting after gait (dizziness but eventually reduced) 164/75 supine end of session    Follow Up Recommendations  Home health PT;Supervision for mobility/OOB     Equipment Recommendations  None recommended by PT    Recommendations for Other Services       Precautions / Restrictions Precautions Precautions: Fall;Other (comment) Precaution Comments: Urinary incontinence, 2 L O2 at baseline, monitor BP (possible ortho hypo) Required Braces or Orthoses: Cervical Brace Cervical Brace: Soft collar;For comfort (coordinated with ortho tech for child size as adult is too large for pt,  delivered 9/5) Restrictions Weight Bearing Restrictions: No    Mobility  Bed Mobility Overal bed mobility: Modified Independent Bed Mobility: Supine to Sit;Sit to Supine     Supine to sit: Modified independent (Device/Increase time);HOB elevated Sit to supine: Modified independent (Device/Increase time);HOB elevated   General bed mobility comments: HOB elevated    Transfers Overall transfer level: Needs assistance Equipment used: 4-wheeled walker Transfers: Sit to/from [facility] Sit to Stand: Min guard Stand pivot transfers: Min guard       General transfer comment: Pt initially attempting to stand from EOB with rollator brakes not applied, but pt was able to identify this and correct it without cues. Min guard assist with transfers to stand for safety due to instability noted, but no LOB. Min guard for stand step transfer commode > bed.  Ambulation/Gait Ambulation/Gait assistance: Min guard Gait Distance (Feet): 8 Feet (x2 bouts of ~8 ft > ~3 ft) Assistive device: Rolling walker (2 wheeled) Gait Pattern/deviations: Step-through pattern;Decreased stride length;Shuffle;Trunk flexed Gait velocity: Decreased Gait velocity interpretation: <1.8 ft/sec, indicate of risk for recurrent falls General Gait Details: Slow, mostly steady gait with rollator. Pt with shuffling steps and trunk flexed posture. Pt with urinary uregncy and thus needing to sit on commode quickly with first bout. Pt limited by fatigue and dizziness also today and was unable to tolerate progressing distance further. Min guard for safety, no LOB.   Stairs             Wheelchair Mobility    Modified Rankin (Stroke Patients Only)       Balance Overall balance assessment: Needs assistance;History of  Falls Sitting-balance support: Single extremity supported;Feet supported;No upper extremity supported Sitting balance-Leahy Scale: Good     Standing balance support: Bilateral upper  extremity supported;Single extremity supported;During functional activity Standing balance-Leahy Scale: Poor Standing balance comment: Relaint on UE support for mobility.                            Cognition Arousal/Alertness: Awake/alert Behavior During Therapy: WFL for tasks assessed/performed Overall Cognitive Status: Within Functional Limits for tasks assessed                                 General Comments: WFL for basic tasks, likely close to baseline. Becomes anxious with dizziness on occasion      Exercises      General Comments General comments (skin integrity, edema, etc.): Poor reading of SpO2 intermittenyl during session but mainly appeared to remain >/= 90%; BP 148/76 supine, 144/74 sitting, 132/95 standing (dizziness reported), 149/82 sitting after gait (dizziness but eventually reduced), 164/75 supine end of session; dizziness, fatigue, and urinary urgency limiting mobility progression this date; educated pt to get up more frequently with nursing to improve endurance and strength      Pertinent Vitals/Pain Pain Assessment: Faces Faces Pain Scale: Hurts a little bit Pain Location: Generalized Pain Descriptors / Indicators: Tiring;Discomfort Pain Intervention(s): Limited activity within patient's tolerance;Monitored during session;Repositioned    Home Living                      Prior Function            PT Goals (current goals can now be found in the care plan section) Acute Rehab PT Goals Patient Stated Goal: to improve PT Goal Formulation: With patient Time For Goal Achievement: 05/12/21 Potential to Achieve Goals: Good Progress towards PT goals: Not progressing toward goals - comment (limited by BP/dizziness, urinary urgency, and fatigue today)    Frequency    Min 3X/week      PT Plan Current plan remains appropriate    Co-evaluation              AM-PAC PT "6 Clicks" Mobility   Outcome Measure  Help  needed turning from your back to your side while in a flat bed without using bedrails?: None Help needed moving from lying on your back to sitting on the side of a flat bed without using bedrails?: None Help needed moving to and from a bed to a chair (including a wheelchair)?: A Little Help needed standing up from a chair using your arms (e.g., wheelchair or bedside chair)?: A Little Help needed to walk in hospital room?: A Little Help needed climbing 3-5 steps with a railing? : A Little 6 Click Score: 20    End of Session Equipment Utilized During Treatment: Gait belt;Oxygen Activity Tolerance: Patient limited by fatigue;Treatment limited secondary to medical complications (Comment) (possible orthostatic hypotension; urinary urgency) Patient left: in bed;with call bell/phone within reach;with bed alarm set Nurse Communication: Mobility status;Other (comment) (BP and dizziness, new soft collar ordered) PT Visit Diagnosis: Other abnormalities of gait and mobility (R26.89);History of falling (Z91.81);Muscle weakness (generalized) (M62.81);Unsteadiness on feet (R26.81);Difficulty in walking, not elsewhere classified (R26.2)     Time: 2800-3491 PT Time Calculation (min) (ACUTE ONLY): 34 min  Charges:  $Gait Training: 8-22 mins $Therapeutic Activity: 8-22 mins  Moishe Spice, PT, DPT Acute Rehabilitation Services  Pager: [phone] Office: [place] 05/04/2021, 11:38 AM

## 2021-05-04 NOTE — Progress Notes (Signed)
RT NOTE: Patient performed NIF with good effort and achieved -34. Vitals are stable. RT will continue to monitor.

## 2021-05-04 NOTE — Care Management Important Message (Signed)
Important Message  Patient Details  Name: Kimberly Downs MRN: [id number] Date of Birth: June 23, 1946   Medicare Important Message Given:  Yes     [name] 05/04/2021, 10:17 AM

## 2021-05-04 NOTE — Progress Notes (Signed)
Orthopedic Tech Progress Note Patient Details:  Kimberly Downs [birth year] [id number]  THERAPY called needing a better fitting SOFT COLLAR for patient  Ortho Devices Type of Ortho Device: Soft collar Ortho Device/Splint Location: NECK Ortho Device/Splint Interventions: Ordered, Other (comment)   Post Interventions Patient Tolerated: Well Instructions Provided: Care of device  Janit Pagan 05/04/2021, 10:13 AM

## 2021-05-05 LAB — CBC
HCT: 26.9 % — ABNORMAL LOW (ref 36.0–46.0)
Hemoglobin: 8.2 g/dL — ABNORMAL LOW (ref 12.0–15.0)
MCH: 23.7 pg — ABNORMAL LOW (ref 26.0–34.0)
MCHC: 30.5 g/dL (ref 30.0–36.0)
MCV: 77.7 fL — ABNORMAL LOW (ref 80.0–100.0)
Platelets: 308 10*3/uL (ref 150–400)
RBC: 3.46 MIL/uL — ABNORMAL LOW (ref 3.87–5.11)
RDW: 21.3 % — ABNORMAL HIGH (ref 11.5–15.5)
WBC: 14.1 10*3/uL — ABNORMAL HIGH (ref 4.0–10.5)
nRBC: 0.1 % (ref 0.0–0.2)

## 2021-05-05 LAB — METHYLMALONIC ACID, SERUM: Methylmalonic Acid, Quantitative: 773 nmol/L — ABNORMAL HIGH (ref 0–378)

## 2021-05-05 LAB — GLUCOSE, CAPILLARY
Glucose-Capillary: 146 mg/dL — ABNORMAL HIGH (ref 70–99)
Glucose-Capillary: 302 mg/dL — ABNORMAL HIGH (ref 70–99)

## 2021-05-05 MED ORDER — OXYCODONE HCL 5 MG PO TABS: 5.0000 mg | ORAL_TABLET | Freq: Four times a day (QID) | ORAL | 0 refills | Status: DC | PRN

## 2021-05-05 MED ORDER — HYDRALAZINE HCL 25 MG PO TABS: 25.0000 mg | ORAL_TABLET | Freq: Three times a day (TID) | ORAL | 1 refills | Status: DC

## 2021-05-05 MED ORDER — PREDNISONE 10 MG PO TABS: 10.0000 mg | ORAL_TABLET | Freq: Every evening | ORAL | 0 refills | Status: AC

## 2021-05-05 MED ORDER — TORSEMIDE 20 MG PO TABS: 10.0000 mg | ORAL_TABLET | Freq: Every day | ORAL | 1 refills | Status: AC

## 2021-05-05 MED ORDER — POTASSIUM CHLORIDE CRYS ER 20 MEQ PO TBCR: 20.0000 meq | EXTENDED_RELEASE_TABLET | Freq: Every day | ORAL | 0 refills | Status: AC

## 2021-05-05 MED ORDER — CYANOCOBALAMIN 1000 MCG PO TABS: 1000.0000 ug | ORAL_TABLET | Freq: Every day | ORAL | 0 refills | Status: DC

## 2021-05-05 MED ORDER — POTASSIUM CHLORIDE CRYS ER 20 MEQ PO TBCR: 20.0000 meq | EXTENDED_RELEASE_TABLET | Freq: Every day | ORAL | 0 refills | Status: DC

## 2021-05-05 MED ORDER — DILTIAZEM HCL ER COATED BEADS 240 MG PO CP24: 240.0000 mg | ORAL_CAPSULE | Freq: Every day | ORAL | 1 refills | Status: DC

## 2021-05-05 MED ORDER — PREDNISONE 10 MG PO TABS: 10.0000 mg | ORAL_TABLET | Freq: Every evening | ORAL | 0 refills | Status: DC

## 2021-05-05 MED ORDER — DILTIAZEM HCL ER COATED BEADS 240 MG PO CP24: 240.0000 mg | ORAL_CAPSULE | Freq: Every day | ORAL | 1 refills | Status: AC

## 2021-05-05 MED ORDER — TORSEMIDE 20 MG PO TABS: 10.0000 mg | ORAL_TABLET | Freq: Every day | ORAL | 1 refills | Status: DC

## 2021-05-05 MED ORDER — CYANOCOBALAMIN 1000 MCG PO TABS: 1000.0000 ug | ORAL_TABLET | Freq: Every day | ORAL | 0 refills | Status: AC

## 2021-05-05 MED ORDER — HYDRALAZINE HCL 25 MG PO TABS: 25.0000 mg | ORAL_TABLET | Freq: Three times a day (TID) | ORAL | 1 refills | Status: AC

## 2021-05-05 MED ORDER — OXYCODONE HCL 5 MG PO TABS: 5.0000 mg | ORAL_TABLET | Freq: Four times a day (QID) | ORAL | 0 refills | Status: AC | PRN

## 2021-05-05 NOTE — Progress Notes (Signed)
RT NOTE: Patient performed NIF X 3 with good effort with following results:   --24cmH20, -28cmH20, -22cmH20  Vitals are stable. RT will continue to monitor.

## 2021-05-05 NOTE — TOC Transition Note (Signed)
Transition of Care [facility]) - CM/SW Discharge Note   Patient Details  Name: Kimberly Downs MRN: [id number] Date of Birth: Jun 16, 1946  [facility]) CM/SW Contact:  Bethena Roys, RN Phone Number: 05/05/2021, 11:37 AM   Clinical Narrative: Case Manager spoke with patient this am and she will be returning to her friends house for a week and a half at the most. Patient then will go to [place]. Patient states she was agreeable to [place] x one week. Case Manager called several agencies and they will not be able to service the patient x one week. Per agencies in one week they will not be able to gauge the patient's progress within one week. Case Manager did make the patient aware that she will need to find a PCP in network in [place], then that office can assess to see if the patient will benefit from home health services. Case Manager did make the physician aware. Patient has oxygen via [employer] to [place] in [place]. Patient needs oxygen for travel home- Case Manager called the office to see if a tank can be delivered to the hospital. Patient then called to state that the friend that she will be staying with can bring the oxygen to the hospital for travel. No further needs from Case Manager at this time.      Final next level of care: Home/Self Care Barriers to Discharge: No Barriers Identified   Patient Goals and CMS Choice Patient states their goals for this hospitalization and ongoing recovery are:: to return to a friends house for one week and then to [place].  Discharge Plan and Services In-house Referral: NA Discharge Planning Services: CM Consult Post Acute Care Choice: Home Health            DME Agency: NA    Social Determinants of Health (SDOH) Interventions Food Insecurity Interventions: Intervention Not Indicated Financial Strain Interventions: Intervention Not Indicated Housing Interventions: Intervention Not Indicated Transportation  Interventions: Patient Refused   Readmission Risk Interventions Readmission Risk Prevention Plan 05/01/2021  Transportation Screening Complete  PCP or Specialist Appt within 3-5 Days Complete  HRI or Home Care Consult Complete  Social Work Consult for [place] Planning/Counseling Complete  Palliative Care Screening Not Applicable  Medication Review [employer]) Complete  Some recent data might be hidden

## 2021-05-05 NOTE — Progress Notes (Signed)
Inpatient Diabetes Program Recommendations  AACE/ADA: New Consensus Statement on Inpatient Glycemic Control (2015)  Target Ranges:  Prepandial:   less than 140 mg/dL      Peak postprandial:   less than 180 mg/dL (1-2 hours)      Critically ill patients:  140 - 180 mg/dL   Lab Results  Component Value Date   GLUCAP 146 (H) 05/05/2021   HGBA1C 7.0 (H) 04/27/2021    Review of Glycemic Control Results for Kimberly Downs, Kimberly Downs (MRN [id number]) as of 05/05/2021 09:34  Ref. Range 05/04/2021 07:41 05/04/2021 12:07 05/04/2021 16:36 05/04/2021 21:39 05/05/2021 08:11  Glucose-Capillary Latest Ref Range: 70 - 99 mg/dL 154 (H) 217 (H) 337 (H) 219 (H) 146 (H)   Diabetes history: DM 2 Outpatient Diabetes medications: diet controlled Current orders for Inpatient glycemic control:  Novolog 0-9 units tid + hs  PO prednisone 10 mg Daily  Inpatient Diabetes Program Recommendations:    Glucose trends increase after meal intake - Consider adding Novolog 3 units tid meal coverage if eating >50% of meals  Thanks,  Tama Headings RN, MSN, BC-ADM Inpatient Diabetes Coordinator Team Pager [phone] (8a-5p)

## 2021-05-05 NOTE — Discharge Summary (Addendum)
Physician Discharge Summary  EMOGENE MURATALLA XHB:[id number] DOB: 1946-06-24 DOA: 04/24/2021  PCP: Lyman Bishop, DO  Admit date: 04/24/2021 Discharge date: 05/05/2021  Time spent: 48minutes  Recommendations for Outpatient Follow-up:  Needs outpatient neurology follow-up in [place], for EMG, repetitive stimulation MuSK Ab panel -pending needs to be followed Needs gastroenterology work-up for iron deficiency anemia  Discharge Diagnoses:  Principal Problem:   Acute on chronic respiratory failure with hypoxia ([place]) Active Problems:   Anemia associated with acute blood loss - GI Bleed   DM (diabetes mellitus), type 2 with renal complications ([place])   Essential hypertension   Coronary artery disease involving native coronary artery of native heart with angina pectoris ([place])   COPD exacerbation ([place])   Hypercholesterolemia   History of coronary artery stent placement   Chronic combined systolic and diastolic heart failure (H[facility])   Paroxysmal atrial fibrillation ([place])   ICD (implantable cardioverter-defibrillator) in place   Acute GI bleeding   Type 2 diabetes, diet controlled ([place])   PAD (peripheral artery disease) (H[facility])   Symptomatic anemia   Myasthenia gravis ([place])   Discharge Condition: Stable  Diet recommendation: Diabetic, low-sodium, heart healthy  Filed Weights   05/03/21 0520 05/04/21 0644 05/05/21 0500  Weight: 44.4 kg 42.6 kg 43.4 kg    History of present illness:  Kimberly Downs is a 75 y.o. female with medical history significant of GI bleed, anemia, atrial fibrillation, bronchiectasis, CHF, C. difficile, COPD/chronic respiratory failure on 2 to 3 L home O2, CAD status post stenting, depression, hypertension, ICD placement, PAD, diabetes, restless leg syndrome who presented with ongoing shortness of breath, generalized weakness, also complained of neck weakness and difficulty ambulating over 1 to 2 weeks    Hospital Course:   Diffuse weakness, double vision,  neck Weakness -Patient and friend reported neck weakness as well as generalized lower extremity weakness -On exam she is noted to have moderate neck extensor weakness, mild weakness Flexion and rotation, easy fatigability, left-sided ptosis -Neurology consulted, myasthenia gravis versus Lambert-Eaton myasthenic syndrome were suspected initially, beta-blocker was discontinued -CT chest abdomen pelvis without evidence of malignancy or thymoma -AChR and LEMS Ab panels were negative . MuSK Ab panel -pending --MRI brain and C-spine were unremarkable, limited by motion artifact -Negative inspiratory force was always greater than 20, now above 30 -She was started on Mestinon by neurology and then discontinued 8/31 on account of increased secretions -Upon further assessments by neurology, clinical suspicion for myasthenia and Eaton-Lambert syndrome are felt to be really low, I suspect her generalized weakness was from anemia, advanced COPD and frailty/deconditioning -Physical therapy following, home PT recommended at discharge -Discharge planning, home with home health services tomorrow if stable   Leukocytosis -reactive, partly from steroids -Afebrile and nontoxic   Acute on Chronic Respiratory Failure (on 2 L chronically) Advanced COPD -Worsening dyspnea was felt to be secondary to anemia, COPD -Labs continued, she is on chronic prednisone this was resumed -Transfused PRBC as noted below   Iron Deficiency Anemia  GI Bleed  Hemoccult Positive Presented with Hb 6.5 (11.8 07/2020) -Transfused 2 units of PRBC this admission and given IV iron -Seen by gastroenterology in consultation, history of ongoing anemia for 3 to 4 months, was supposed to have a EGD through her local GI in [place], on account of above neurological issues gastroenterology recommended follow-up with her local GI in [place], or in [place] where she plan is to move to next week  Paroxysmal atrial fibrillation with  RVR -  Heart rate poorly controlled today, carvedilol was discontinued by neurology, now on p.o. Cardizem instead -Not appropriate for anticoagulation in setting of severe iron deficiency anemia and heme positive stools   Chronic systolic CHF Severely elevated BNP, clinically doesn't appear volume overloaded -Echo with EF 30-35%, moderately decreased function, global hypokinesis (prior echo with EF 25-30% - see report) -Clinically euvolemic, torsemide on hold, carvedilol discontinued by neurology on account of suspected myasthenia   COPD/chronic respiratory failure on 2 L at home -Stable, no wheezing Continue scheduled and prn nebs NIFs as noted above   Right Carotid Bulb/Proximal R ICA stenosis up to 75% Follow outpatient with vascular   3 mm focal outpouching from supraclinoid L ICA Aneurysm not excluded, likely small vascular infundibulum Follow outpatient   CAD PAD -Held Plavix and aspirin in the setting of heme positive stools, severe iron deficiency anemia, asked patient to discuss with gastroenterology after GI work-up regarding restarting antiplatelet agents -Carvedilol discontinued  Hypertension -Stable   RLS - Continue home ropinirole   Diabetes > Diet controlled - SSI   Polymyalgia Rheumatica - she's on chronic steroids prednisone 10 mg daily, continued   Mild Bilateral Hydroureteronephrosis 7 cc on bladder scan -Dr. Florene Glen discussed with Dr. Tresa Moore, noted mild finding on CT, likely related to bladder distension at time of study, no recommendation for any additional imaging or workup unless new symptoms or aki    Osteoporosis  Hx Fragility Fractures Upper thoracic and L rib fx noted on imaging here - osteopenia noted on imaging of cervical spine Compression deformities of T2/T3, severe at T3, subacute to chronic bilateral rib fx Recent dexa suggesting osteoporosis She's on chronic steroids at baseline   Code Status:  DNR  Consultations: Neurology Gastroenterology  Discharge Exam: Vitals:   05/05/21 0820 05/05/21 1133  BP: (!) 157/60 (!) 146/71  Pulse: 95 100  Resp: 18   Temp: 98.5 F (36.9 C) 98.4 F (36.9 C)  SpO2: 100%     General: AAOx3, no distress Cardiovascular: S1-S2, regular rate rhythm Respiratory: Poor air movement bilaterally  Discharge Instructions   Discharge Instructions     Diet - low sodium heart healthy   Complete by: As directed    Increase activity slowly   Complete by: As directed       Allergies as of 05/05/2021       Reactions   Gabapentin Other (See Comments)   Passed out.   Lyrica [pregabalin] Other (See Comments)   "almost died" confusion, syncope, hypertension   Dicyclomine Other (See Comments)   Reaction:  Agitation  Other reaction(s): Agitation   Ibuprofen Nausea And Vomiting   Morphine And Related Nausea And Vomiting   Nsaids Nausea And Vomiting   Toradol [ketorolac Tromethamine] Nausea And Vomiting   Levaquin [levofloxacin] Nausea And Vomiting, Rash   Metronidazole Nausea And Vomiting, Rash        Medication List     STOP taking these medications    CALCIUM PLUS D3 ABSORBABLE PO   carvedilol 6.25 MG tablet Commonly known as: COREG   clopidogrel 75 MG tablet Commonly known as: PLAVIX   losartan 25 MG tablet Commonly known as: COZAAR   losartan 50 MG tablet Commonly known as: COZAAR   meclizine 12.5 MG tablet Commonly known as: ANTIVERT   Spiriva Respimat 2.5 MCG/ACT Aers Generic drug: Tiotropium Bromide Monohydrate   traMADol 50 MG tablet Commonly known as: ULTRAM       TAKE these medications    aspirin EC 81 MG  tablet Take 81 mg by mouth daily.   baclofen 10 MG tablet Commonly known as: LIORESAL Take 10 mg by mouth 2 (two) times daily as needed for muscle spasms.   cyanocobalamin 1000 MCG tablet Take 1 tablet (1,000 mcg total) by mouth daily. Start taking on: May 06, 2021   diltiazem 240 MG 24 hr  capsule Commonly known as: CARDIZEM CD Take 1 capsule (240 mg total) by mouth daily. Start taking on: May 06, 2021   hydrALAZINE 25 MG tablet Commonly known as: APRESOLINE Take 1 tablet (25 mg total) by mouth 3 (three) times daily.   Ipratropium-Albuterol 20-100 MCG/ACT Aers respimat Commonly known as: COMBIVENT Inhale 1 puff into the lungs every 6 (six) hours. What changed: when to take this   lidocaine 5 % Commonly known as: Lidoderm Place 1 patch onto the skin daily. Remove & Discard patch within 12 hours or as directed by MD   mometasone-formoterol 200-5 MCG/ACT Aero Commonly known as: DULERA Inhale 2 puffs into the lungs 2 (two) times daily.   nitroGLYCERIN 0.4 MG SL tablet Commonly known as: NITROSTAT PLACE 1 TABLET UNDER THE TONGUE EVERY 5 MINUTES AS NEEDED FOR CHEST PAIN. What changed: See the new instructions.   omeprazole 40 MG capsule Commonly known as: PRILOSEC Take 40 mg by mouth every evening.   ondansetron 4 MG tablet Commonly known as: ZOFRAN Take 4 mg by mouth every 8 (eight) hours as needed for nausea or vomiting.   oxyCODONE 5 MG immediate release tablet Commonly known as: Oxy IR/ROXICODONE Take 1 tablet (5 mg total) by mouth every 6 (six) hours as needed for severe pain.   potassium chloride SA 20 MEQ tablet Commonly known as: KLOR-CON Take 1 tablet (20 mEq total) by mouth daily.   predniSONE 10 MG tablet Commonly known as: DELTASONE Take 1 tablet (10 mg total) by mouth every evening.   rOPINIRole 2 MG tablet Commonly known as: REQUIP Take 2 mg by mouth every evening.   torsemide 20 MG tablet Commonly known as: DEMADEX Take 0.5 tablets (10 mg total) by mouth daily. Take an extra 20 mg for a weight over 100 pounds. What changed: how much to take       Allergies  Allergen Reactions   Gabapentin Other (See Comments)    Passed out.   Lyrica [Pregabalin] Other (See Comments)    "almost died" confusion, syncope, hypertension    Dicyclomine Other (See Comments)    Reaction:  Agitation  Other reaction(s): Agitation   Ibuprofen Nausea And Vomiting   Morphine And Related Nausea And Vomiting   Nsaids Nausea And Vomiting   Toradol [Ketorolac Tromethamine] Nausea And Vomiting   Levaquin [Levofloxacin] Nausea And Vomiting and Rash   Metronidazole Nausea And Vomiting and Rash      The results of significant diagnostics from this hospitalization (including imaging, microbiology, ancillary and laboratory) are listed below for reference.    Significant Diagnostic Studies: CT ANGIO HEAD NECK W WO CM  Result Date: 04/26/2021 CLINICAL DATA:  Initial evaluation for neck weakness, difficulty with ambulation. EXAM: CT ANGIOGRAPHY HEAD AND NECK TECHNIQUE: Multidetector CT imaging of the head and neck was performed using the standard protocol during bolus administration of intravenous contrast. Multiplanar CT image reconstructions and MIPs were obtained to evaluate the vascular anatomy. Carotid stenosis measurements (when applicable) are obtained utilizing NASCET criteria, using the distal internal carotid diameter as the denominator. CONTRAST:  64mL OMNIPAQUE IOHEXOL 350 MG/ML SOLN COMPARISON:  Prior CT from 10/30/2020.  As  well as earlier studies. FINDINGS: CT HEAD FINDINGS Brain: Generalized age-related cerebral atrophy with moderately advanced chronic microvascular ischemic disease. No acute intracranial hemorrhage. No acute large vessel territory infarct. No mass lesion, mass effect or midline shift. No hydrocephalus or extra-axial fluid collection. Vascular: No hyperdense vessel. Calcified atherosclerosis present at skull base. Skull: Scalp soft tissues demonstrate no acute finding. Calvarium intact. 7 mm sclerotic lesion at the right frontal bone, stable as compared to multiple previous studies dating back to at least 2017, consistent with a benign finding. Sinuses: Visualized paranasal sinuses are clear. Small chronic appearing left  mastoid effusion. Orbits: Globes and orbital soft tissues demonstrate no acute finding. Review of the MIP images confirms the above findings CTA NECK FINDINGS Aortic arch: Visualized aortic arch normal in caliber with normal 3 vessel morphology. Advanced atheromatous change seen throughout the visualized aorta and about the origin of the great vessels, but with no significant stenosis. Right carotid system: Left CCA tortuous but patent without significant stenosis. Extensive atheromatous change about the right carotid bulb/proximal right ICA with associated stenosis of up to 75% by NASCET criteria. Right ICA tortuous but is otherwise patent to the skull base without stenosis, dissection or occlusion. Left carotid system: Left CCA tortuous but is widely patent to the bifurcation. Scattered calcified plaque about the left carotid bulb/proximal left ICA without significant stenosis. Left ICA tortuous but is widely patent without stenosis, dissection or occlusion. Vertebral arteries: Both vertebral arteries arise from subclavian arteries. No hemodynamically significant proximal subclavian artery stenosis. Strongly dominant right vertebral artery, with a diffusely hypoplastic left vertebral artery. Vertebral arteries are diffusely tortuous with scattered atheromatous irregularity, but no high-grade stenosis or other acute vascular abnormality. Skeleton: Compression deformities involving the T2 and T3 vertebral bodies noted, severe at T3. Focal exaggeration of the normal upper thoracic kyphosis. No other discrete or worrisome osseous lesions. Patient is edentulous. Other neck: No other visible acute soft tissue abnormality within the neck. No mass or adenopathy. Upper chest: Left-sided pacemaker/AICD in place. Multiple remote bilateral rib fractures noted. Small layering bilateral pleural effusions, left greater than right, partially visualized. Emphysema. Review of the MIP images confirms the above findings CTA HEAD  FINDINGS Anterior circulation: Scattered atheromatous change throughout the petrous, cavernous, and supraclinoid segments without flow-limiting stenosis. 3 mm focal outpouching arising from the supraclinoid left ICA favored to reflect a small vascular infundibulum, although possible aneurysm could also be considered (series 8, image 187). A1 segments patent bilaterally. Normal anterior communicating artery complex. Anterior cerebral arteries patent to their distal aspects without stenosis. No M1 stenosis or occlusion. Normal MCA bifurcations. Distal MCA branches well perfused and symmetric. Posterior circulation: Dominant right vertebral artery patent to the vertebrobasilar junction. Hypoplastic left vertebral artery largely terminates in PICA. Left PICA patent. Right PICA not well seen. Basilar patent to its distal aspect without stenosis. Superior cerebral arteries patent bilaterally. Both PCAs primarily supplied via the basilar well perfused to their distal aspects. Venous sinuses: Patent allowing for timing the contrast bolus. Anatomic variants: Hypoplastic left vertebral artery terminates in PICA. Review of the MIP images confirms the above findings IMPRESSION: CT HEAD IMPRESSION: 1. No acute intracranial abnormality. 2. Age-related cerebral atrophy with moderately advanced chronic microvascular ischemic disease. CTA HEAD AND NECK IMPRESSION: 1. Negative CTA for large vessel occlusion. 2. Extensive atheromatous change about the right carotid bulb/proximal right ICA with associated stenosis of up to 75% by NASCET criteria. 3. Additional moderate to advanced atheromatous change elsewhere about the major arterial vasculature of  the neck, but no other proximal high-grade or correctable stenosis. 4. 3 mm focal outpouching arising from the supraclinoid left ICA, favored to reflect a small vascular infundibulum, although aneurysm not excluded. Attention at follow-up recommended. 5. Compression deformities involving  the T2 and T3 vertebral bodies, severe at T3. Correlation with physical exam recommended. Multiple additional subacute to chronic bilateral rib fractures. 6. Small layering bilateral pleural effusions, left greater than right, partially visualized. 7. Emphysema (ICD10-J43.9). Electronically Signed   By: Jeannine Boga M.D.   On: 04/26/2021 00:38   DG Cervical Spine 2 or 3 views  Result Date: 04/25/2021 CLINICAL DATA:  Pain EXAM: CERVICAL SPINE - 2-3 VIEW COMPARISON:  CT thoracic spine 10/30/2020 FINDINGS: osteopenia. The cervicothoracic junction including the C7 fracture demonstrated on prior study is poorly visualized. Upper thoracic compression deformities are again demonstrated. Changes of cement augmentation in the lower thoracic spine. Subacute left rib fractures again noted. Aortic Atherosclerosis (ICD10-170.0). Bilateral calcified carotid arterial plaque. Left subclavian transvenous pacing leads partially visualized. IMPRESSION: 1. Osteopenia.  Incomplete evaluation of lower cervical spine. 2. Upper thoracic and left rib fractures, as previously described. Electronically Signed   By: Lucrezia Europe M.D.   On: 04/25/2021 14:11   MR BRAIN WO CONTRAST  Result Date: 05/01/2021 CLINICAL DATA:  Encephalopathy EXAM: MRI HEAD WITHOUT CONTRAST TECHNIQUE: Multiplanar, multiecho pulse sequences of the brain and surrounding structures were obtained without intravenous contrast. COMPARISON:  None. FINDINGS: Markedly motion degraded examination. Brain: No acute infarct, mass effect or extra-axial collection. No acute or chronic hemorrhage. Hyperintense T2-weighted signal is moderately widespread throughout the white matter. Generalized volume loss without a clear lobar predilection. right Vascular: Major flow voids are preserved. Skull and upper cervical spine: Normal calvarium and skull base. Visualized upper cervical spine and soft tissues are normal. Sinuses/Orbits:No paranasal sinus fluid levels or advanced  mucosal thickening. No mastoid or middle ear effusion. Normal orbits. IMPRESSION: 1. Markedly motion degraded examination. 2. No acute intracranial abnormality. 3. Generalized volume loss and findings of chronic small vessel ischemia. Electronically Signed   By: Ulyses Jarred M.D.   On: 05/01/2021 20:02   MR CERVICAL SPINE WO CONTRAST  Result Date: 05/01/2021 CLINICAL DATA:  Myelopathy, acute or progressive proximal muscle and neck weakness. EXAM: MRI CERVICAL SPINE WITHOUT CONTRAST TECHNIQUE: Multiplanar, multisequence MR imaging of the cervical spine was performed. No intravenous contrast was administered. COMPARISON:  Cervical spine radiographs 04/25/2021 and cervical spine CT 11/10/2017. FINDINGS: Despite efforts by the technologist and patient, moderate motion artifact is present on today's exam and could not be eliminated. This reduces exam sensitivity and specificity. Cranial findings are dictated separately. Alignment: Stable exaggerated cervical lordosis without focal angulation or listhesis. Stable kyphosis of the upper thoracic spine related to an old T3 compression fracture. Vertebrae: No acute or suspicious osseous findings. Chronic healed anterior compression deformities at T3 and T4 appear unchanged. Cord: Cord evaluation limited by motion. The cervical cord appears normal in signal and caliber. There is mild ventral indentation on the thoracic cord at the level of the chronic T3 fracture which is only imaged in the sagittal plane. No associated abnormal cord signal. Posterior Fossa, vertebral arteries, paraspinal tissues: Intracranial findings are dictated separately. There is infratentorial atrophy and low-density within the pons. No significant paraspinal findings. Bilateral vertebral artery flow voids. Disc levels: Mild multilevel cervical spondylosis with disc bulging, uncinate spurring and facet hypertrophy. No significant central spinal stenosis or cord deformity. Foraminal narrowing is  limited by the motion. No high-grade foraminal  narrowing is seen. IMPRESSION: 1. Study is moderately motion degraded. Cranial findings are dictated separately. 2. No acute findings are identified. 3. Stable chronic anterior compression deformities at T3 and T4. 4. No evidence of cervical cord compression or myelopathy. 5. Mild spondylosis. Electronically Signed   By: Richardean Sale M.D.   On: 05/01/2021 18:07   CT CHEST ABDOMEN PELVIS W CONTRAST  Result Date: 04/27/2021 CLINICAL DATA:  Weakness. EXAM: CT CHEST, ABDOMEN, AND PELVIS WITH CONTRAST TECHNIQUE: Multidetector CT imaging of the chest, abdomen and pelvis was performed following the standard protocol during bolus administration of intravenous contrast. CONTRAST:  72mL OMNIPAQUE IOHEXOL 350 MG/ML SOLN COMPARISON:  August 05, 2020.  September 22, 2017. FINDINGS: CT CHEST FINDINGS Cardiovascular: Atherosclerosis of thoracic aorta is noted without aneurysm or dissection. Normal cardiac size. No pericardial effusion. Coronary artery calcifications are noted. Mediastinum/Nodes: No enlarged mediastinal, hilar, or axillary lymph nodes. Thyroid gland, trachea, and esophagus demonstrate no significant findings. Lungs/Pleura: No pneumothorax is noted. Emphysematous disease is noted bilaterally. Mild bibasilar subsegmental atelectasis is noted. Minimal left pleural effusion is noted. Musculoskeletal: Multiple old bilateral rib fractures are noted. Old sternal fracture is noted. Status post kyphoplasty of lower thoracic vertebral body. No acute abnormality is noted. CT ABDOMEN PELVIS FINDINGS Hepatobiliary: Stable low densities are noted in the left and right hepatic lobes most consistent with hemangiomas. Status post cholecystectomy. No biliary dilatation is noted. Pancreas: Unremarkable. No pancreatic ductal dilatation or surrounding inflammatory changes. Spleen: Normal in size without focal abnormality. Adrenals/Urinary Tract: Adrenal glands are unremarkable. Mild  bilateral hydroureteronephrosis is noted. Moderate urinary bladder distention is noted. No obstructing calculus is noted. Stomach/Bowel: Stomach appears normal. There is no evidence of bowel obstruction or inflammation. Status post appendectomy. Large amount of stool seen throughout the colon. Vascular/Lymphatic: Aortic atherosclerosis. No enlarged abdominal or pelvic lymph nodes. Reproductive: Status post hysterectomy. No adnexal masses. Other: No abdominal wall hernia or abnormality. No abdominopelvic ascites. Musculoskeletal: No acute or significant osseous findings. IMPRESSION: Mild bilateral posterior basilar subsegmental atelectasis is noted. Small left pleural effusion is noted. Mild bilateral hydroureteronephrosis is noted without obstructing calculus, potentially due to moderate urinary bladder distention. Multiple old bilateral rib and sternal fractures are noted. Coronary artery calcifications are noted suggesting coronary artery disease. Aortic Atherosclerosis (ICD10-I70.0) and Emphysema (ICD10-J43.9). Electronically Signed   By: Marijo Conception M.D.   On: 04/27/2021 16:53   CT C-SPINE NO CHARGE  Result Date: 04/25/2021 CLINICAL DATA:  Neck weakness.  Difficulty ambulating. EXAM: CT CERVICAL SPINE WITHOUT CONTRAST TECHNIQUE: Multidetector CT imaging of the cervical spine was performed without intravenous contrast. Multiplanar CT image reconstructions were also generated. COMPARISON:  X-ray cervical spine 04/25/2021, CT thoracic spine 10/30/2020 FINDINGS: Alignment: Normal. Skull base and vertebrae: No acute cervical spine fracture. No aggressive appearing focal osseous lesion or focal pathologic process. Chronic anterior wedge compression fracture with greater than 95% height loss of the T3 vertebral body. Soft tissues and spinal canal: No prevertebral fluid or swelling. No visible canal hematoma. Upper chest: Emphysematous changes. No pneumothorax at the apices. Atherosclerotic plaque of the aortic  arch. Other: Redemonstration of subacute left rib fractures. Vasculature and intracranial: Please see separately dictated CT angiography head and neck 04/25/2021. IMPRESSION: 1. No acute displaced fracture or traumatic listhesis of the cervical spine. 2. Chronic anterior wedge compression fracture with greater than 95% height loss of the T3 vertebral body. 3. Please see separately dictated CT angiography head and neck 04/25/2021. 4. Redemonstration of subacute left rib fractures. 5. Aortic  Atherosclerosis (ICD10-I70.0) and Emphysema (ICD10-J43.9). Electronically Signed   By: Iven Finn M.D.   On: 04/25/2021 20:04   DG CHEST PORT 1 VIEW  Result Date: 05/03/2021 CLINICAL DATA:  Sepsis, chronic respiratory failure with hypoxia EXAM: PORTABLE CHEST 1 VIEW COMPARISON:  Chest radiograph from one day prior. FINDINGS: Stable single lead left subclavian ICD. Stable cardiomediastinal silhouette with mild cardiomegaly. No pneumothorax. Slight blunting of the costophrenic angles bilaterally. No pulmonary edema. Mild bibasilar scarring versus atelectasis. No acute consolidative airspace disease. Vertebroplasty material within chronic midthoracic vertebral compression fracture. IMPRESSION: 1. Stable mild cardiomegaly without pulmonary edema. 2. Mild bibasilar scarring versus atelectasis. 3. Slight blunting of the costophrenic angles bilaterally, cannot exclude trace pleural effusions. Electronically Signed   By: Ilona Sorrel M.D.   On: 05/03/2021 09:38   DG CHEST PORT 1 VIEW  Result Date: 05/02/2021 CLINICAL DATA:  Chronic respiratory failure.  Dyspnea. EXAM: PORTABLE CHEST 1 VIEW COMPARISON:  One-view chest x-ray 04/24/2021 FINDINGS: Heart size is normal. Atherosclerotic changes are noted in the arch. Multiple healed bilateral rib fractures again noted. Chronic interstitial coarsening noted. Lungs are otherwise clear. No edema or effusion is present. IMPRESSION: No acute cardiopulmonary disease or significant interval  change. Electronically Signed   By: San Morelle M.D.   On: 05/02/2021 14:04   DG Chest Port 1 View  Result Date: 04/24/2021 CLINICAL DATA:  Shortness of breath EXAM: PORTABLE CHEST 1 VIEW COMPARISON:  Chest radiograph 08/05/2020 FINDINGS: There is a left chest wall cardiac device with a single lead projecting over the right ventricle. The heart is enlarged, accentuated by AP technique. The mediastinal contours are within normal limits. There is calcified atherosclerotic plaque of the aortic arch. The lungs are hyperlucent with flattening of the diaphragms suggesting COPD. Linear opacities in the right lung base likely reflect scarring and/or atelectasis. There is no focal consolidation or pulmonary edema. There is a small right pleural effusion. There is no significant left pleural effusion. There is no pneumothorax. There is a displaced fracture of the posterior left fifth rib which may be acute. Prior vertebral augmentation in the thoracic spine is noted. IMPRESSION: 1. Trace right pleural effusion with adjacent atelectasis. 2. Cardiomegaly and suspected COPD. 3. Displaced fracture of the posterior left fifth rib which may be acute. Correlate with history and tenderness, and CT may be considered as indicated. Electronically Signed   By: Valetta Mole M.D.   On: 04/24/2021 16:26   ECHOCARDIOGRAM COMPLETE  Result Date: 04/25/2021    ECHOCARDIOGRAM REPORT   Patient Name:   KEYLAH DARWISH Date of Exam: 04/25/2021 Medical Rec #:  [id number]     Height:       60.5 in Accession #:    [id number]    Weight:       89.1 lb Date of Birth:  24-Aug-1946     BSA:          1.332 m Patient Age:    28 years      BP:           128/62 mmHg Patient Gender: F             HR:           70 bpm. Exam Location:  Inpatient Procedure: 2D Echo, Color Doppler and Cardiac Doppler Indications:    R06.9 DOE  History:        Patient has prior history of Echocardiogram examinations, most  recent 06/16/2017. CHF,  Defibrillator, COPD, Arrythmias:Atrial                 Fibrillation; Risk Factors:Hypertension, Diabetes and                 Dyslipidemia.  Sonographer:    Raquel Sarna Senior RDCS Referring Phys: [id number] Candace Gallus MELVIN  Sonographer Comments: Very poor echo windows due to body habitus and COPD. Scanned hunched over/upright, unable to reposition due to C-spine concerns per RN. IMPRESSIONS  1. Left ventricular ejection fraction, by estimation, is 30 to 35%. The left ventricle has moderately decreased function. The left ventricle demonstrates global hypokinesis. Indeterminate diastolic filling due to E-A fusion.  2. Right ventricular systolic function is normal. The right ventricular size is normal. There is normal pulmonary artery systolic pressure.  3. Left atrial size was severely dilated.  4. The mitral valve is grossly normal. Trivial mitral valve regurgitation.  5. The aortic valve was not well visualized. Aortic valve regurgitation is not visualized. No aortic stenosis is present.  6. The inferior vena cava is normal in size with greater than 50% respiratory variability, suggesting right atrial pressure of 3 mmHg. Comparison(s): A prior study was performed on 06/16/2017. Prior images reviewed side by side. Similar LV function. FINDINGS  Left Ventricle: Left ventricular ejection fraction, by estimation, is 30 to 35%. The left ventricle has moderately decreased function. The left ventricle demonstrates global hypokinesis. The left ventricular internal cavity size was normal in size. There is no left ventricular hypertrophy. Indeterminate diastolic filling due to E-A fusion. Right Ventricle: The right ventricular size is normal. No increase in right ventricular wall thickness. Right ventricular systolic function is normal. There is normal pulmonary artery systolic pressure. The tricuspid regurgitant velocity is 1.99 m/s, and  with an assumed right atrial pressure of 3 mmHg, the estimated right ventricular systolic  pressure is 03.5 mmHg. Left Atrium: Left atrial size was severely dilated. Right Atrium: Right atrial size was normal in size. Pericardium: Trivial pericardial effusion is present. Mitral Valve: The mitral valve is grossly normal. Mild mitral annular calcification. Trivial mitral valve regurgitation. Tricuspid Valve: The tricuspid valve is grossly normal. Tricuspid valve regurgitation is trivial. No evidence of tricuspid stenosis. Aortic Valve: The aortic valve was not well visualized. Aortic valve regurgitation is not visualized. No aortic stenosis is present. Pulmonic Valve: The pulmonic valve was not well visualized. Pulmonic valve regurgitation is not visualized. No evidence of pulmonic stenosis. Aorta: The aortic root is normal in size and structure. Venous: The inferior vena cava is normal in size with greater than 50% respiratory variability, suggesting right atrial pressure of 3 mmHg. IAS/Shunts: The atrial septum is grossly normal. Additional Comments: A device lead is visualized.  LEFT VENTRICLE PLAX 2D LVIDd:         5.20 cm      Diastology LVIDs:         4.30 cm      LV e' medial:  3.37 cm/s LV PW:         0.70 cm      LV e' lateral: 5.77 cm/s LV IVS:        0.70 cm LVOT diam:     2.30 cm LV SV:         57 LV SV Index:   42 LVOT Area:     4.15 cm  LV Volumes (MOD) LV vol d, MOD A4C: 138.0 ml LV vol s, MOD A4C: 78.7 ml LV SV MOD A4C:  138.0 ml RIGHT VENTRICLE RV S prime:     10.20 cm/s TAPSE (M-mode): 1.8 cm LEFT ATRIUM           Index       RIGHT ATRIUM           Index LA diam:      3.60 cm 2.70 cm/m  RA Area:     14.60 cm LA Vol (A4C): 94.1 ml 70.66 ml/m RA Volume:   37.30 ml  28.01 ml/m  AORTIC VALVE LVOT Vmax:   65.30 cm/s LVOT Vmean:  46.800 cm/s LVOT VTI:    0.136 m  AORTA Ao Root diam: 3.20 cm TRICUSPID VALVE TR Peak grad:   15.8 mmHg TR Vmax:        199.00 cm/s  SHUNTS Systemic VTI:  0.14 m Systemic Diam: 2.30 cm Rudean Haskell MD Electronically signed by Rudean Haskell MD  Signature Date/Time: 04/25/2021/3:09:20 PM    Final     Microbiology: Recent Results (from the past 240 hour(s))  Culture, blood (routine x 2)     Status: None (Preliminary result)   Collection Time: 05/03/21  9:39 AM   Specimen: BLOOD  Result Value Ref Range Status   Specimen Description BLOOD SITE NOT SPECIFIED  Final   Special Requests   Final    AEROBIC BOTTLE ONLY Blood Culture results may not be optimal due to an inadequate volume of blood received in culture bottles   Culture   Final    NO GROWTH 2 DAYS Performed at [facility], [address], [place], [place] [address]    Report Status PENDING  Incomplete  Culture, blood (routine x 2)     Status: None (Preliminary result)   Collection Time: 05/03/21  9:39 AM   Specimen: BLOOD  Result Value Ref Range Status   Specimen Description BLOOD SITE NOT SPECIFIED  Final   Special Requests   Final    AEROBIC BOTTLE ONLY Blood Culture results may not be optimal due to an inadequate volume of blood received in culture bottles   Culture   Final    NO GROWTH 2 DAYS Performed at [facility], [place] [address] [place], [place] [address]    Report Status PENDING  Incomplete  Urine Culture     Status: None   Collection Time: 05/03/21  3:10 PM   Specimen: Urine, Clean Catch  Result Value Ref Range Status   Specimen Description URINE, CLEAN CATCH  Final   Special Requests Normal  Final   Culture   Final    NO GROWTH Performed at [facility], [place] [address] [place], [place] [address]    Report Status 05/04/2021 FINAL  Final     Labs: Basic Metabolic Panel: Recent Labs  Lab 04/29/21 0218 04/30/21 0305 05/01/21 0306 05/02/21 0132 05/03/21 0206 05/04/21 0115  NA 140 138 136 139 138 136  K 3.9 3.4* 3.8 4.2 4.1 4.5  CL 98 95* 97* 103 100 103  CO2 34* 35* 30 30 28 25   GLUCOSE 119* 169* 130* 149* 114* 157*  BUN 36* 31* 27* 25* 22 22  CREATININE 1.31* 1.25* 1.28* 1.09* 1.13* 0.90  CALCIUM 9.0 8.8* 9.2 9.7 9.7  9.0  MG 2.1  --   --   --   --   --   PHOS 4.4  --   --   --   --   --    Liver Function Tests: Recent Labs  Lab  04/29/21 0218  AST 17  ALT 16  ALKPHOS 44  BILITOT 0.8  PROT 5.5*  ALBUMIN 3.2*   No results for input(s): LIPASE, AMYLASE in the last 168 hours. No results for input(s): AMMONIA in the last 168 hours. CBC: Recent Labs  Lab 04/29/21 0218 04/30/21 0305 05/01/21 0306 05/02/21 0132 05/03/21 0206 05/04/21 0115 05/05/21 0238  WBC 11.9*   < > 12.0* 17.3* 20.0* 14.6* 14.1*  NEUTROABS 10.0*  --   --   --   --   --   --   HGB 7.5*   < > 7.5* 7.2* 9.1* 8.2* 8.2*  HCT 26.5*   < > 26.5* 26.0* 31.3* 27.5* 26.9*  MCV 75.1*   < > 74.9* 76.5* 78.3* 78.1* 77.7*  PLT 461*   < > 411* 413* 413* 351 308   < > = values in this interval not displayed.   Cardiac Enzymes: No results for input(s): CKTOTAL, CKMB, CKMBINDEX, TROPONINI in the last 168 hours. BNP: BNP (last 3 results) Recent Labs    08/05/20 1943 04/24/21 1527  BNP 203.2* 2,483.3*    ProBNP (last 3 results) No results for input(s): PROBNP in the last 8760 hours.  CBG: Recent Labs  Lab 05/04/21 1207 05/04/21 1636 05/04/21 2139 05/05/21 0811 05/05/21 1132  GLUCAP 217* 337* 219* 146* 302*       Signed:  Domenic Polite MD.  [facility] 05/05/2021, 11:34 AM

## 2021-05-08 LAB — CULTURE, BLOOD (ROUTINE X 2)
Culture: NO GROWTH
Culture: NO GROWTH

## 2021-05-10 LAB — MISC LABCORP TEST (SEND OUT): Labcorp test code: 165605

## 2021-06-03 ENCOUNTER — Ambulatory Visit: Payer: Medicare Other | Admitting: Neurology

## 2021-07-30 DEATH — deceased

## 2021-08-30 DEATH — deceased
